# Patient Record
Sex: Female | Born: 1935
Health system: Southern US, Community
[De-identification: ages and names within clinical notes are randomized; demographics above are authoritative.]

## PROBLEM LIST (undated history)

## (undated) DIAGNOSIS — N3941 Urge incontinence: Secondary | ICD-10-CM

## (undated) DIAGNOSIS — I10 Essential (primary) hypertension: Secondary | ICD-10-CM

## (undated) DIAGNOSIS — I428 Other cardiomyopathies: Secondary | ICD-10-CM

## (undated) DIAGNOSIS — Z95810 Presence of automatic (implantable) cardiac defibrillator: Secondary | ICD-10-CM

## (undated) DIAGNOSIS — I480 Paroxysmal atrial fibrillation: Secondary | ICD-10-CM

## (undated) DIAGNOSIS — E785 Hyperlipidemia, unspecified: Secondary | ICD-10-CM

## (undated) DIAGNOSIS — I7300 Raynaud's syndrome without gangrene: Secondary | ICD-10-CM

## (undated) DIAGNOSIS — R000 Tachycardia, unspecified: Secondary | ICD-10-CM

## (undated) DIAGNOSIS — G609 Hereditary and idiopathic neuropathy, unspecified: Secondary | ICD-10-CM

## (undated) DIAGNOSIS — I5020 Unspecified systolic (congestive) heart failure: Secondary | ICD-10-CM

## (undated) DIAGNOSIS — T82198A Other mechanical complication of other cardiac electronic device, initial encounter: Secondary | ICD-10-CM

## (undated) HISTORY — DX: Tachycardia, unspecified: R00.0

## (undated) HISTORY — PX: APPENDECTOMY: SHX54

## (undated) HISTORY — DX: Other mechanical complication of other cardiac electronic device, initial encounter: T82.198A

## (undated) HISTORY — PX: OTHER SURGICAL HISTORY: SHX169

## (undated) HISTORY — DX: Presence of automatic (implantable) cardiac defibrillator: Z95.810

## (undated) HISTORY — PX: ABDOMINAL HYSTERECTOMY: SHX81

## (undated) HISTORY — DX: Hyperlipidemia, unspecified: E78.5

## (undated) HISTORY — DX: Paroxysmal atrial fibrillation: I48.0

## (undated) HISTORY — DX: Other cardiomyopathies: I42.8

## (undated) HISTORY — DX: Raynaud's syndrome without gangrene: I73.00

## (undated) HISTORY — DX: Essential (primary) hypertension: I10

## (undated) HISTORY — DX: Hereditary and idiopathic neuropathy, unspecified: G60.9

## (undated) HISTORY — DX: Unspecified systolic (congestive) heart failure: I50.20

## (undated) HISTORY — DX: Urge incontinence: N39.41

---

## 1990-03-31 ENCOUNTER — Encounter (INDEPENDENT_AMBULATORY_CARE_PROVIDER_SITE_OTHER): Payer: Self-pay | Admitting: *Deleted

## 1990-03-31 HISTORY — PX: OTHER SURGICAL HISTORY: SHX169

## 1997-09-29 ENCOUNTER — Encounter: Admission: RE | Admit: 1997-09-29 | Discharge: 1997-09-29 | Payer: Self-pay | Admitting: Family Medicine

## 1997-10-03 ENCOUNTER — Encounter: Admission: RE | Admit: 1997-10-03 | Discharge: 1997-10-03 | Payer: Self-pay | Admitting: Family Medicine

## 1998-02-01 ENCOUNTER — Encounter: Admission: RE | Admit: 1998-02-01 | Discharge: 1998-02-01 | Payer: Self-pay | Admitting: Family Medicine

## 1998-10-18 ENCOUNTER — Encounter: Admission: RE | Admit: 1998-10-18 | Discharge: 1998-10-18 | Payer: Self-pay | Admitting: Family Medicine

## 1998-11-12 ENCOUNTER — Other Ambulatory Visit: Admission: RE | Admit: 1998-11-12 | Discharge: 1998-11-12 | Payer: Self-pay | Admitting: *Deleted

## 1998-11-12 ENCOUNTER — Encounter: Admission: RE | Admit: 1998-11-12 | Discharge: 1998-11-12 | Payer: Self-pay | Admitting: Family Medicine

## 1999-01-31 ENCOUNTER — Encounter: Admission: RE | Admit: 1999-01-31 | Discharge: 1999-01-31 | Payer: Self-pay | Admitting: Family Medicine

## 2000-01-27 ENCOUNTER — Encounter: Admission: RE | Admit: 2000-01-27 | Discharge: 2000-01-27 | Payer: Self-pay | Admitting: Sports Medicine

## 2001-01-26 ENCOUNTER — Emergency Department (HOSPITAL_COMMUNITY): Admission: EM | Admit: 2001-01-26 | Discharge: 2001-01-26 | Payer: Self-pay | Admitting: Emergency Medicine

## 2001-01-27 ENCOUNTER — Encounter: Admission: RE | Admit: 2001-01-27 | Discharge: 2001-01-27 | Payer: Self-pay | Admitting: Family Medicine

## 2001-02-03 ENCOUNTER — Encounter: Admission: RE | Admit: 2001-02-03 | Discharge: 2001-02-03 | Payer: Self-pay | Admitting: Family Medicine

## 2002-01-11 ENCOUNTER — Encounter: Admission: RE | Admit: 2002-01-11 | Discharge: 2002-01-11 | Payer: Self-pay | Admitting: Family Medicine

## 2002-01-24 ENCOUNTER — Encounter: Admission: RE | Admit: 2002-01-24 | Discharge: 2002-01-24 | Payer: Self-pay | Admitting: Family Medicine

## 2002-02-21 ENCOUNTER — Encounter: Admission: RE | Admit: 2002-02-21 | Discharge: 2002-02-21 | Payer: Self-pay | Admitting: Sports Medicine

## 2002-03-01 ENCOUNTER — Encounter: Admission: RE | Admit: 2002-03-01 | Discharge: 2002-03-01 | Payer: Self-pay | Admitting: Family Medicine

## 2002-03-01 ENCOUNTER — Encounter: Admission: RE | Admit: 2002-03-01 | Discharge: 2002-03-01 | Payer: Self-pay | Admitting: Sports Medicine

## 2002-03-01 ENCOUNTER — Encounter: Payer: Self-pay | Admitting: Sports Medicine

## 2002-05-05 ENCOUNTER — Encounter: Admission: RE | Admit: 2002-05-05 | Discharge: 2002-05-05 | Payer: Self-pay | Admitting: Family Medicine

## 2002-08-31 ENCOUNTER — Encounter: Admission: RE | Admit: 2002-08-31 | Discharge: 2002-08-31 | Payer: Self-pay | Admitting: Family Medicine

## 2002-12-23 ENCOUNTER — Encounter: Admission: RE | Admit: 2002-12-23 | Discharge: 2002-12-23 | Payer: Self-pay | Admitting: Family Medicine

## 2003-01-26 ENCOUNTER — Encounter: Admission: RE | Admit: 2003-01-26 | Discharge: 2003-01-26 | Payer: Self-pay | Admitting: Family Medicine

## 2003-05-17 ENCOUNTER — Encounter: Admission: RE | Admit: 2003-05-17 | Discharge: 2003-05-17 | Payer: Self-pay | Admitting: Family Medicine

## 2003-11-17 ENCOUNTER — Encounter: Admission: RE | Admit: 2003-11-17 | Discharge: 2003-11-17 | Payer: Self-pay | Admitting: Family Medicine

## 2003-11-17 ENCOUNTER — Ambulatory Visit (HOSPITAL_COMMUNITY): Admission: RE | Admit: 2003-11-17 | Discharge: 2003-11-17 | Payer: Self-pay | Admitting: Family Medicine

## 2003-11-20 ENCOUNTER — Encounter: Admission: RE | Admit: 2003-11-20 | Discharge: 2003-11-20 | Payer: Self-pay | Admitting: Sports Medicine

## 2003-12-11 ENCOUNTER — Ambulatory Visit: Payer: Self-pay | Admitting: Family Medicine

## 2003-12-21 ENCOUNTER — Ambulatory Visit: Payer: Self-pay | Admitting: Family Medicine

## 2004-01-15 ENCOUNTER — Ambulatory Visit: Payer: Self-pay | Admitting: Sports Medicine

## 2004-03-05 ENCOUNTER — Ambulatory Visit: Payer: Self-pay | Admitting: Sports Medicine

## 2004-12-24 ENCOUNTER — Ambulatory Visit: Payer: Self-pay | Admitting: Sports Medicine

## 2005-01-02 ENCOUNTER — Encounter: Admission: RE | Admit: 2005-01-02 | Discharge: 2005-04-02 | Payer: Self-pay | Admitting: Sports Medicine

## 2005-02-27 ENCOUNTER — Ambulatory Visit (HOSPITAL_COMMUNITY): Admission: RE | Admit: 2005-02-27 | Discharge: 2005-02-27 | Payer: Self-pay | Admitting: Family Medicine

## 2005-02-27 ENCOUNTER — Ambulatory Visit: Payer: Self-pay | Admitting: Family Medicine

## 2005-02-28 ENCOUNTER — Ambulatory Visit (HOSPITAL_COMMUNITY): Admission: RE | Admit: 2005-02-28 | Discharge: 2005-02-28 | Payer: Self-pay | Admitting: Family Medicine

## 2005-02-28 ENCOUNTER — Ambulatory Visit: Payer: Self-pay | Admitting: Family Medicine

## 2005-03-27 ENCOUNTER — Encounter: Admission: RE | Admit: 2005-03-27 | Discharge: 2005-03-27 | Payer: Self-pay | Admitting: Sports Medicine

## 2005-03-31 HISTORY — PX: CARDIAC CATHETERIZATION: SHX172

## 2005-04-03 ENCOUNTER — Inpatient Hospital Stay (HOSPITAL_COMMUNITY): Admission: AD | Admit: 2005-04-03 | Discharge: 2005-04-12 | Payer: Self-pay | Admitting: Cardiovascular Disease

## 2005-04-08 ENCOUNTER — Ambulatory Visit: Payer: Self-pay | Admitting: Emergency Medicine

## 2005-04-28 ENCOUNTER — Ambulatory Visit: Payer: Self-pay | Admitting: Emergency Medicine

## 2005-05-28 ENCOUNTER — Ambulatory Visit: Payer: Self-pay | Admitting: Family Medicine

## 2005-05-30 ENCOUNTER — Ambulatory Visit: Payer: Self-pay | Admitting: Emergency Medicine

## 2005-06-16 ENCOUNTER — Ambulatory Visit: Payer: Self-pay | Admitting: Family Medicine

## 2005-09-08 ENCOUNTER — Encounter: Admission: RE | Admit: 2005-09-08 | Discharge: 2005-09-08 | Payer: Self-pay | Admitting: Cardiovascular Disease

## 2005-09-11 ENCOUNTER — Ambulatory Visit: Payer: Self-pay | Admitting: Family Medicine

## 2005-10-30 ENCOUNTER — Encounter (HOSPITAL_COMMUNITY): Admission: RE | Admit: 2005-10-30 | Discharge: 2006-01-28 | Payer: Self-pay | Admitting: Cardiology

## 2005-11-18 ENCOUNTER — Ambulatory Visit: Payer: Self-pay | Admitting: Sports Medicine

## 2005-12-16 ENCOUNTER — Ambulatory Visit: Payer: Self-pay | Admitting: Sports Medicine

## 2005-12-24 ENCOUNTER — Ambulatory Visit: Payer: Self-pay | Admitting: Family Medicine

## 2006-03-20 ENCOUNTER — Ambulatory Visit: Payer: Self-pay | Admitting: Family Medicine

## 2006-05-28 DIAGNOSIS — I10 Essential (primary) hypertension: Secondary | ICD-10-CM

## 2006-05-28 DIAGNOSIS — M810 Age-related osteoporosis without current pathological fracture: Secondary | ICD-10-CM

## 2006-05-28 DIAGNOSIS — I428 Other cardiomyopathies: Secondary | ICD-10-CM | POA: Insufficient documentation

## 2006-05-28 DIAGNOSIS — N3941 Urge incontinence: Secondary | ICD-10-CM

## 2006-05-28 DIAGNOSIS — I5022 Chronic systolic (congestive) heart failure: Secondary | ICD-10-CM

## 2006-05-28 DIAGNOSIS — E785 Hyperlipidemia, unspecified: Secondary | ICD-10-CM | POA: Insufficient documentation

## 2006-05-29 ENCOUNTER — Encounter (INDEPENDENT_AMBULATORY_CARE_PROVIDER_SITE_OTHER): Payer: Self-pay | Admitting: *Deleted

## 2006-07-29 ENCOUNTER — Encounter (INDEPENDENT_AMBULATORY_CARE_PROVIDER_SITE_OTHER): Payer: Self-pay | Admitting: Family Medicine

## 2006-07-29 ENCOUNTER — Ambulatory Visit: Payer: Self-pay | Admitting: Family Medicine

## 2006-07-29 LAB — CONVERTED CEMR LAB
ALT: 12 units/L (ref 0–35)
CO2: 30 meq/L (ref 19–32)
Calcium: 9 mg/dL (ref 8.4–10.5)
Chloride: 101 meq/L (ref 96–112)
Creatinine, Ser: 0.67 mg/dL (ref 0.40–1.20)
Glucose, Bld: 116 mg/dL — ABNORMAL HIGH (ref 70–99)
LDL Goal: 100 mg/dL
TSH: 3.217 microintl units/mL (ref 0.350–5.50)
Total Protein: 6.2 g/dL (ref 6.0–8.3)

## 2006-07-30 ENCOUNTER — Encounter (INDEPENDENT_AMBULATORY_CARE_PROVIDER_SITE_OTHER): Payer: Self-pay | Admitting: Family Medicine

## 2006-08-01 ENCOUNTER — Encounter (INDEPENDENT_AMBULATORY_CARE_PROVIDER_SITE_OTHER): Payer: Self-pay | Admitting: Family Medicine

## 2006-08-27 ENCOUNTER — Encounter (INDEPENDENT_AMBULATORY_CARE_PROVIDER_SITE_OTHER): Payer: Self-pay | Admitting: Family Medicine

## 2006-08-27 ENCOUNTER — Encounter: Admission: RE | Admit: 2006-08-27 | Discharge: 2006-08-27 | Payer: Self-pay | Admitting: Family Medicine

## 2006-08-30 ENCOUNTER — Encounter (INDEPENDENT_AMBULATORY_CARE_PROVIDER_SITE_OTHER): Payer: Self-pay | Admitting: Family Medicine

## 2006-10-19 ENCOUNTER — Encounter (INDEPENDENT_AMBULATORY_CARE_PROVIDER_SITE_OTHER): Payer: Self-pay | Admitting: Family Medicine

## 2006-10-26 ENCOUNTER — Telehealth (INDEPENDENT_AMBULATORY_CARE_PROVIDER_SITE_OTHER): Payer: Self-pay | Admitting: Family Medicine

## 2006-11-18 ENCOUNTER — Telehealth (INDEPENDENT_AMBULATORY_CARE_PROVIDER_SITE_OTHER): Payer: Self-pay | Admitting: Family Medicine

## 2006-11-20 ENCOUNTER — Encounter (INDEPENDENT_AMBULATORY_CARE_PROVIDER_SITE_OTHER): Payer: Self-pay | Admitting: Family Medicine

## 2006-11-20 ENCOUNTER — Ambulatory Visit: Payer: Self-pay | Admitting: Family Medicine

## 2006-11-20 LAB — CONVERTED CEMR LAB: Vit D, 1,25-Dihydroxy: 46

## 2006-11-24 ENCOUNTER — Encounter (INDEPENDENT_AMBULATORY_CARE_PROVIDER_SITE_OTHER): Payer: Self-pay | Admitting: Family Medicine

## 2006-12-04 ENCOUNTER — Ambulatory Visit: Payer: Self-pay | Admitting: Family Medicine

## 2006-12-09 ENCOUNTER — Encounter (INDEPENDENT_AMBULATORY_CARE_PROVIDER_SITE_OTHER): Payer: Self-pay | Admitting: Family Medicine

## 2006-12-16 ENCOUNTER — Encounter (INDEPENDENT_AMBULATORY_CARE_PROVIDER_SITE_OTHER): Payer: Self-pay | Admitting: Family Medicine

## 2007-01-12 ENCOUNTER — Encounter (INDEPENDENT_AMBULATORY_CARE_PROVIDER_SITE_OTHER): Payer: Self-pay | Admitting: Family Medicine

## 2007-01-13 ENCOUNTER — Ambulatory Visit: Payer: Self-pay | Admitting: Family Medicine

## 2007-03-17 ENCOUNTER — Ambulatory Visit: Payer: Self-pay | Admitting: Family Medicine

## 2007-04-09 ENCOUNTER — Ambulatory Visit: Payer: Self-pay | Admitting: Family Medicine

## 2007-07-23 ENCOUNTER — Ambulatory Visit: Payer: Self-pay | Admitting: Family Medicine

## 2007-10-19 ENCOUNTER — Ambulatory Visit: Payer: Self-pay | Admitting: Family Medicine

## 2007-10-20 ENCOUNTER — Encounter: Payer: Self-pay | Admitting: *Deleted

## 2007-12-29 ENCOUNTER — Encounter: Payer: Self-pay | Admitting: Family Medicine

## 2008-01-25 ENCOUNTER — Telehealth (INDEPENDENT_AMBULATORY_CARE_PROVIDER_SITE_OTHER): Payer: Self-pay | Admitting: *Deleted

## 2008-01-26 ENCOUNTER — Encounter: Payer: Self-pay | Admitting: Family Medicine

## 2008-01-26 ENCOUNTER — Ambulatory Visit: Payer: Self-pay | Admitting: Family Medicine

## 2008-01-26 ENCOUNTER — Ambulatory Visit (HOSPITAL_COMMUNITY): Admission: RE | Admit: 2008-01-26 | Discharge: 2008-01-26 | Payer: Self-pay | Admitting: Family Medicine

## 2008-01-26 ENCOUNTER — Telehealth: Payer: Self-pay | Admitting: Family Medicine

## 2008-01-26 ENCOUNTER — Telehealth (INDEPENDENT_AMBULATORY_CARE_PROVIDER_SITE_OTHER): Payer: Self-pay | Admitting: Family Medicine

## 2008-01-26 LAB — CONVERTED CEMR LAB
Nitrite: NEGATIVE
Specific Gravity, Urine: 1.015
Urobilinogen, UA: 0.2

## 2008-01-28 ENCOUNTER — Encounter: Payer: Self-pay | Admitting: Family Medicine

## 2008-02-15 ENCOUNTER — Ambulatory Visit: Payer: Self-pay | Admitting: Family Medicine

## 2008-03-15 ENCOUNTER — Encounter: Payer: Self-pay | Admitting: Family Medicine

## 2008-03-21 ENCOUNTER — Encounter: Payer: Self-pay | Admitting: Family Medicine

## 2008-04-12 ENCOUNTER — Ambulatory Visit: Payer: Self-pay | Admitting: Family Medicine

## 2008-04-25 ENCOUNTER — Ambulatory Visit: Payer: Self-pay | Admitting: Family Medicine

## 2008-04-28 ENCOUNTER — Encounter: Payer: Self-pay | Admitting: Family Medicine

## 2008-05-15 ENCOUNTER — Encounter: Payer: Self-pay | Admitting: Family Medicine

## 2008-05-19 ENCOUNTER — Ambulatory Visit: Payer: Self-pay | Admitting: Family Medicine

## 2008-05-19 ENCOUNTER — Encounter: Payer: Self-pay | Admitting: Family Medicine

## 2008-06-09 ENCOUNTER — Telehealth: Payer: Self-pay | Admitting: *Deleted

## 2008-06-13 ENCOUNTER — Encounter: Payer: Self-pay | Admitting: Family Medicine

## 2008-07-24 ENCOUNTER — Ambulatory Visit: Payer: Self-pay | Admitting: Family Medicine

## 2008-09-28 ENCOUNTER — Inpatient Hospital Stay (HOSPITAL_COMMUNITY): Admission: EM | Admit: 2008-09-28 | Discharge: 2008-09-29 | Payer: Self-pay | Admitting: Emergency Medicine

## 2008-09-28 LAB — CONVERTED CEMR LAB
ALT: 13 U/L
AST: 23 U/L
Albumin: 3.5 g/dL
Alkaline Phosphatase: 37 U/L
BUN: 9 mg/dL
CO2: 28 meq/L
Calcium: 9.3 mg/dL
Chloride: 102 meq/L
Creatinine, Ser: 0.8 mg/dL
Potassium: 4.5 meq/L
Sodium: 139 meq/L
TSH: 2.658 u[IU]/mL
Total Bilirubin: 0.7 mg/dL
Total Protein: 5.8 g/dL

## 2008-10-16 ENCOUNTER — Ambulatory Visit: Payer: Self-pay | Admitting: Family Medicine

## 2008-10-23 ENCOUNTER — Ambulatory Visit: Payer: Self-pay | Admitting: Family Medicine

## 2008-10-25 ENCOUNTER — Encounter: Payer: Self-pay | Admitting: Family Medicine

## 2009-01-17 ENCOUNTER — Encounter: Payer: Self-pay | Admitting: *Deleted

## 2009-01-26 ENCOUNTER — Ambulatory Visit: Payer: Self-pay | Admitting: Family Medicine

## 2009-03-09 ENCOUNTER — Encounter: Payer: Self-pay | Admitting: Family Medicine

## 2009-03-21 ENCOUNTER — Encounter: Payer: Self-pay | Admitting: Family Medicine

## 2009-04-30 ENCOUNTER — Ambulatory Visit: Payer: Self-pay | Admitting: Family Medicine

## 2009-04-30 ENCOUNTER — Encounter: Payer: Self-pay | Admitting: Family Medicine

## 2009-04-30 LAB — CONVERTED CEMR LAB
CO2: 26 meq/L (ref 19–32)
Calcium: 8.7 mg/dL (ref 8.4–10.5)
Chloride: 101 meq/L (ref 96–112)
Potassium: 4.5 meq/L (ref 3.5–5.3)
Sodium: 137 meq/L (ref 135–145)

## 2009-05-01 ENCOUNTER — Encounter: Payer: Self-pay | Admitting: Family Medicine

## 2009-05-05 ENCOUNTER — Emergency Department (HOSPITAL_COMMUNITY): Admission: EM | Admit: 2009-05-05 | Discharge: 2009-05-05 | Payer: Self-pay | Admitting: Emergency Medicine

## 2009-08-03 ENCOUNTER — Ambulatory Visit: Payer: Self-pay | Admitting: Family Medicine

## 2009-09-19 ENCOUNTER — Encounter: Payer: Self-pay | Admitting: Family Medicine

## 2009-10-29 ENCOUNTER — Ambulatory Visit: Payer: Self-pay | Admitting: Family Medicine

## 2009-10-29 ENCOUNTER — Encounter: Payer: Self-pay | Admitting: Family Medicine

## 2009-10-29 LAB — CONVERTED CEMR LAB
CO2: 28 meq/L (ref 19–32)
Chloride: 103 meq/L (ref 96–112)
Creatinine, Ser: 0.68 mg/dL (ref 0.40–1.20)

## 2009-12-18 ENCOUNTER — Encounter: Payer: Self-pay | Admitting: Family Medicine

## 2010-01-03 ENCOUNTER — Ambulatory Visit: Payer: Self-pay | Admitting: Family Medicine

## 2010-02-04 ENCOUNTER — Ambulatory Visit: Payer: Self-pay | Admitting: Family Medicine

## 2010-02-04 ENCOUNTER — Telehealth: Payer: Self-pay | Admitting: Family Medicine

## 2010-03-07 ENCOUNTER — Ambulatory Visit: Payer: Self-pay | Admitting: Family Medicine

## 2010-03-07 ENCOUNTER — Encounter: Payer: Self-pay | Admitting: Family Medicine

## 2010-03-07 LAB — CONVERTED CEMR LAB
AST: 38 units/L — ABNORMAL HIGH (ref 0–37)
BUN: 13 mg/dL (ref 6–23)
Calcium: 9.3 mg/dL (ref 8.4–10.5)
Chloride: 106 meq/L (ref 96–112)
Creatinine, Ser: 0.71 mg/dL (ref 0.40–1.20)
HCT: 39 % (ref 36.0–46.0)
HDL: 92 mg/dL (ref >39)
Hemoglobin: 12.3 g/dL (ref 12.0–15.0)
LDL Cholesterol: 116 mg/dL — ABNORMAL HIGH (ref 0–99)
RDW: 17.7 % — ABNORMAL HIGH (ref 11.5–15.5)
Total CHOL/HDL Ratio: 2.5
WBC: 6.4 10*3/uL (ref 4.0–10.5)

## 2010-03-11 ENCOUNTER — Encounter: Payer: Self-pay | Admitting: Family Medicine

## 2010-03-11 ENCOUNTER — Ambulatory Visit: Payer: Self-pay | Admitting: Family Medicine

## 2010-03-11 DIAGNOSIS — R5383 Other fatigue: Secondary | ICD-10-CM

## 2010-03-12 LAB — CONVERTED CEMR LAB: Free T4: 1.14 ng/dL (ref 0.80–1.80)

## 2010-03-19 ENCOUNTER — Encounter: Payer: Self-pay | Admitting: Family Medicine

## 2010-03-19 HISTORY — PX: NM MYOCAR PERF WALL MOTION: HXRAD629

## 2010-03-31 DIAGNOSIS — G609 Hereditary and idiopathic neuropathy, unspecified: Secondary | ICD-10-CM

## 2010-03-31 HISTORY — DX: Hereditary and idiopathic neuropathy, unspecified: G60.9

## 2010-04-28 LAB — CONVERTED CEMR LAB
ALT: 17 units/L
AST: 27 units/L
Cholesterol: 234 mg/dL
Creatinine, Ser: 0.8 mg/dL
Potassium: 4.6 meq/L

## 2010-05-02 NOTE — Assessment & Plan Note (Signed)
Summary: [facility]   Nurse Visit   Vital Signs:  Patient profile:   77 year old female Temp:     38 degrees F  Vitals Entered By: Theresia Lo RN (January 03, 2010 8:44 AM)  Allergies: 1)  Sulfamethoxazole (Sulfamethoxazole)  Immunizations Administered:  Influenza Vaccine # 1:    Vaccine Type: Fluvax MCR    Site: left deltoid    Mfr: GlaxoSmithKline    Dose: 0.5 ml    Route: IM    Given by: Theresia Lo RN    Exp. Date: 09/25/2010    Lot #: [id number]    VIS given: 10/23/09 version given January 03, 2010.  Flu Vaccine Consent Questions:    Do you have a history of severe allergic reactions to this vaccine? no    Any prior history of allergic reactions to egg and/or gelatin? no    Do you have a sensitivity to the preservative Thimersol? no    Do you have a past history of Guillan-Barre Syndrome? no    Do you currently have an acute febrile illness? no    Have you ever had a severe reaction to latex? no    Vaccine information given and explained to patient? yes    Are you currently pregnant? no  Orders Added: 1)  Influenza Vaccine MCR [00025] 2)  Administration Flu vaccine - MCR [G0008]     Vital Signs:  Patient profile:   77 year old female Temp:     49 degrees F  Vitals Entered By: Theresia Lo RN (January 03, 2010 8:44 AM)

## 2010-05-02 NOTE — Assessment & Plan Note (Signed)
Summary: boniva,df   Vital Signs:  Patient profile:   77 year old female Weight:      117.4 pounds Pulse rate:   82 / minute BP sitting:   121 / 65  (right arm)  Vitals Entered By: Arlyss Repress CMA, (February 04, 2010 9:58 AM) CC: boniva IV   Primary Care Provider:  CAT TA MD  CC:  boniva IV.  History of Present Illness: Here for IVP Boniva, denies any bone pain.  Taking her calcium and vitamin D.  Current Medications (verified): 1)  Aspirin Ec 81 Mg Tbec (Aspirin) .... Take 1 Tablet By Mouth Bid 2)  Coreg 6.25 Mg Tabs (Carvedilol) .Marland Kitchen.. 1 Tablet By Mouth Bid 3)  Micardis 40 Mg Tabs (Telmisartan) .... One Tablet By Mouth Daily 4)  Lanoxin 0.125 Mg Tabs (Digoxin) .... Take 1 Tablet By Mouth Once A Day 5)  Lasix 40 Mg Tabs (Furosemide) .... Take 1 Tablet By Mouth Once A Day As Needed Breathlessness or Swelling 6)  Red Yeast Rice 600 Mg Caps (Red Yeast Rice Extract) .Marland Kitchen.. 1 Tablet By Mouth Bid 7)  Calcium 600 600 Mg Tabs (Calcium Carbonate) .Marland Kitchen.. 1 Tablet By Mouth Bid 8)  Boniva 3 Mg/52ml Kit (Ibandronate Sodium) 9)  Spironolactone 25 Mg Tabs (Spironolactone) .... 1/2 Tablet By Mouth Daily  Allergies (verified): 1)  Sulfamethoxazole (Sulfamethoxazole)  Physical Exam  General:  Well-developed,well-nourished,in no acute distress; alert,appropriate and cooperative throughout examination   Impression & Recommendations:  Problem # 1:  OSTEOPOROSIS, UNSPECIFIED (ICD-733.00)  IVP Boniva given, excellent blood return, patient tolerated procedule well. Lot [id number], exp 5/13  Her updated medication list for this problem includes:    Calcium 600 600 Mg Tabs (Calcium carbonate) .Marland Kitchen... 1 tablet by mouth bid    Boniva 3 Mg/50ml Kit (Ibandronate sodium)  Orders: Provider Misc Charge- [facility] (Misc)  Complete Medication List: 1)  Aspirin Ec 81 Mg Tbec (Aspirin) .... Take 1 tablet by mouth bid 2)  Coreg 6.25 Mg Tabs (Carvedilol) .Marland Kitchen.. 1 tablet by mouth bid 3)  Micardis 40 Mg Tabs  (Telmisartan) .... One tablet by mouth daily 4)  Lanoxin 0.125 Mg Tabs (Digoxin) .... Take 1 tablet by mouth once a day 5)  Lasix 40 Mg Tabs (Furosemide) .... Take 1 tablet by mouth once a day as needed breathlessness or swelling 6)  Red Yeast Rice 600 Mg Caps (Red yeast rice extract) .Marland Kitchen.. 1 tablet by mouth bid 7)  Calcium 600 600 Mg Tabs (Calcium carbonate) .Marland Kitchen.. 1 tablet by mouth bid 8)  Boniva 3 Mg/28ml Kit (Ibandronate sodium) 9)  Spironolactone 25 Mg Tabs (Spironolactone) .... 1/2 tablet by mouth daily   Orders Added: 1)  Provider Misc Charge- [facility] [Misc]

## 2010-05-02 NOTE — Consult Note (Signed)
[facility]   Imported By: De Nurse 03/21/2010 16:53:11  _____________________________________________________________________  External Attachment:    Type:   Image     Comment:   External Document

## 2010-05-02 NOTE — Assessment & Plan Note (Signed)
Summary: boniva/eo   Vital Signs:  Patient profile:   77 year old female Weight:      113.4 pounds Pulse rate:   83 / minute BP sitting:   126 / 66  (right arm)  Vitals Entered By: Arlyss Repress CMA, (October 29, 2009 8:39 AM) CC: Boniva IV Is Patient Diabetic? No Pain Assessment Patient in pain? no        Primary Care Provider:  CAT TA MD  CC:  Boniva IV.  History of Present Illness: Here for IV Boniva.  Deneis any joint or bone pain.    Habits & Providers  Alcohol-Tobacco-Diet     Tobacco Status: quit  Current Medications (verified): 1)  Aspirin Ec 81 Mg Tbec (Aspirin) .... Take 1 Tablet By Mouth Bid 2)  Coreg 6.25 Mg Tabs (Carvedilol) .Marland Kitchen.. 1 Tablet By Mouth Bid 3)  Micardis 40 Mg Tabs (Telmisartan) .... One Tablet By Mouth Daily 4)  Lanoxin 0.125 Mg Tabs (Digoxin) .... Take 1 Tablet By Mouth Once A Day 5)  Lasix 40 Mg Tabs (Furosemide) .... Take 1 Tablet By Mouth Once A Day As Needed Breathlessness or Swelling 6)  Red Yeast Rice 600 Mg Caps (Red Yeast Rice Extract) .Marland Kitchen.. 1 Tablet By Mouth Bid 7)  Calcium 600 600 Mg Tabs (Calcium Carbonate) .Marland Kitchen.. 1 Tablet By Mouth Bid 8)  Boniva 3 Mg/55ml Kit (Ibandronate Sodium) 9)  Spironolactone 25 Mg Tabs (Spironolactone) .... 1/2 Tablet By Mouth Daily  Allergies (verified): 1)  Sulfamethoxazole (Sulfamethoxazole)  Physical Exam  General:  Well-developed,well-nourished,in no acute distress; alert,appropriate and cooperative throughout examination   Impression & Recommendations:  Problem # 1:  OSTEOPOROSIS, UNSPECIFIED (ICD-733.00) IVP Boniva given without complications. Lot [id number], exp 9/12  BMET drawn to check renal function. Her updated medication list for this problem includes:    Calcium 600 600 Mg Tabs (Calcium carbonate) .Marland Kitchen... 1 tablet by mouth bid    Boniva 3 Mg/52ml Kit (Ibandronate sodium)  Orders: Basic Met-[facility] (09811-91478) Provider Misc Charge- [facility] (Misc)  Complete Medication List: 1)  Aspirin Ec 81 Mg  Tbec (Aspirin) .... Take 1 tablet by mouth bid 2)  Coreg 6.25 Mg Tabs (Carvedilol) .Marland Kitchen.. 1 tablet by mouth bid 3)  Micardis 40 Mg Tabs (Telmisartan) .... One tablet by mouth daily 4)  Lanoxin 0.125 Mg Tabs (Digoxin) .... Take 1 tablet by mouth once a day 5)  Lasix 40 Mg Tabs (Furosemide) .... Take 1 tablet by mouth once a day as needed breathlessness or swelling 6)  Red Yeast Rice 600 Mg Caps (Red yeast rice extract) .Marland Kitchen.. 1 tablet by mouth bid 7)  Calcium 600 600 Mg Tabs (Calcium carbonate) .Marland Kitchen.. 1 tablet by mouth bid 8)  Boniva 3 Mg/61ml Kit (Ibandronate sodium) 9)  Spironolactone 25 Mg Tabs (Spironolactone) .... 1/2 tablet by mouth daily  Patient Instructions: 1)  Please schedule a follow-up appointment in 3 months .

## 2010-05-02 NOTE — Consult Note (Signed)
Summary: [facility]  [facility]   Imported By: Clydell Hakim 09/26/2009 16:00:24  _____________________________________________________________________  External Attachment:    Type:   Image     Comment:   External Document  Appended Document: [facility]     Clinical Lists Changes  Observations: Added new observation of PAST MED HX: EKG w/ LBBB Nonischemic cardiomyopathy with St Jude  Atlas Biventricular device placed in 2007 at [facility] (followed by Dr Julieanne Mans[facility]) Short bursts of PAF, asymptomatic.  Dr Clarene Duke discussed potential coumadin with Pt 08/2009.   No CAD per cath CHF with EF 40% following AICD EF 40-45%% by cath and ECHO 9/09 HTN Cardiologist:  Dr Clarene Duke of [facility] Statin intolerant Hyperlipidemia, takes red yeast capsules Raynaud phenomenon   (10/02/2009 15:25) Added new observation of PRIMARY MD: Konnar Ben MD (10/02/2009 15:25)        Past History:  Past Medical History: EKG w/ LBBB Nonischemic cardiomyopathy with St Jude  Atlas Biventricular device placed in 2007 at [facility] (followed by Dr Julieanne Mans[facility] of [facility]) Short bursts of PAF, asymptomatic.  Dr Clarene Duke discussed potential coumadin with Pt 08/2009.   No CAD per cath CHF with EF 40% following AICD EF 40-45%% by cath and ECHO 9/09 HTN Cardiologist:  Dr Clarene Duke of [facility] Statin intolerant Hyperlipidemia, takes red yeast capsules Raynaud phenomenon

## 2010-05-02 NOTE — Miscellaneous (Signed)
Summary: Changing CHF to Systolic CHF   Clinical Lists Changes  Problems: Changed problem from CHF - EJECTION FRACTION < 50% (ICD-428.22) to CHRONIC SYSTOLIC HEART FAILURE (ICD-428.22) Observations: Added new observation of PRIMARY MD: Eiliyah Reh MD (12/18/2009 10:48)

## 2010-05-02 NOTE — Progress Notes (Signed)
Summary: Req labs   Phone Note Call from Patient Call back at Home Phone [phone]   Reason for Call: Talk to Nurse Summary of Call: pt is asking MD to put in lab orders for cholesterol & thyroid & then wants to have an appt with MD to review the results, pt doesn't want to come 2 times for just labs. If MD agrees please forward to scheduler so pt can come before her appt on 12/12.  Initial call taken by: Knox Royalty,  February 04, 2010 10:36 AM  Follow-up for Phone Call        I'll order routine labs.  Thyroid is not indicated for her because she is not taking thyroid med.  I don't have an indication for it.  I will get FLP, as pt is on red yeast rice.   Follow-up by: Cat Ta MD,  February 05, 2010 9:39 AM  Additional Follow-up for Phone Call Additional follow up Details #1::        left pt a vm to return call so I could schedule lab appt.  Additional Follow-up by: Knox Royalty,  February 05, 2010 12:45 PM

## 2010-05-02 NOTE — Assessment & Plan Note (Signed)
Summary: lab results/eo   Vital Signs:  Patient profile:   77 year old female Height:      63.5 inches Weight:      116 pounds BMI:     20.30 Temp:     97.8 degrees F BP sitting:   128 / 64  (left arm) Cuff size:   regular  Vitals Entered By: Tessie Fass CMA (March 11, 2010 8:39 AM) CC: F/U labs   Primary Care Provider:  CAT TA MD  CC:  F/U labs.  History of Present Illness: 77 y/o F with ischemic cardimyoapthy with Echo 2009 EF 40-45%, s/p St Jude biventricular device  CHF with EF 40-45%: Has Lasix 40mg  by mouth daily as needed for dyspnea or LE edema. Took it one time in Nov.  Stable.  Has not had any exercabtion requiring hospitaliztion in recent years.  No dyspnea, syncope, LE edema.    HLD: Statin intolerant.  Takes Red Rice Yeast.  LDL is trending upward.  Pt states, "At 89 with a heart that does not work that well, I don't think I need to take more meds."  Pt understand the risks and does not want to pursue another line of treatment for HLD.    HYPERTENSION Disease Monitoring   Blood pressure range:  120s/70s-80s Medications:  Coreg 6.25 two times a day, Laisx 40mg  as needed edem Compliance: yes  Lightheadedness: no   Edema: no Chest pain: no Dyspnea:no   Prevention Exercise: none  Salt restriction:yes    Fatigue: Feeling like she lacks energy x at least 2 yrs.  She can get all her daily acitivities.  She does not fall asleep during the day or nap.  Denies dyspnea on exertion.   Sleep: ave 4-5 yrs per night, stat Mood: "I stay in pretty good spirit" Appetite: "so-so, I eat because I know I need to, unless it's chocolate or cheese or sweets."  She eats more during the holidays.  Does not keep sweets in her house. Depression:  thinks that there is situational depression regarding her great grandchild.  Feelings of sadness because her granddaughter does not take care of her great grandchild.   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     Exercise: none.  She has a gym that she can go to and states that [facility] will pay for it next year.          Current Medications (verified): 1)  Aspirin Ec 81 Mg Tbec (Aspirin) .... Take 1 Tablet By Mouth Bid 2)  Coreg 6.25 Mg Tabs (Carvedilol) .Marland Kitchen.. 1 Tablet By Mouth Bid 3)  Lanoxin 0.125 Mg Tabs (Digoxin) .... Take 1 Tablet By Mouth Once A Day 4)  Lasix 40 Mg Tabs (Furosemide) .... Take 1 Tablet By Mouth Once A Day As Needed Breathlessness or Swelling 5)  Red Yeast Rice 600 Mg Caps (Red Yeast Rice Extract) .Marland Kitchen.. 1 Tablet By Mouth Bid 6)  Calcium 600 600 Mg Tabs (Calcium Carbonate) .Marland Kitchen.. 1 Tablet By Mouth Daily 7)  Boniva 3 Mg/39ml Kit (Ibandronate Sodium)  Allergies (verified): 1)  Sulfamethoxazole (Sulfamethoxazole)  Past History:  Past Surgical History: Last updated: 05/19/2008 Appendectomy - -, Biventricular ICD implant - 01/06/2006,  bladder tack -,  Bone Density - 07/99 (T= -2.05) -,  Cath 1/07 EF 25% nl Arteries-  nonischemic CM - 08/08/2005,  ECHO 7/07 EF 30% - 10/15/2005,  Hysterectomy - 1970`s -,  ICD implantation - 10/15/2005,   Lipoma removal 8/00 -,  suprapubic urethrovesicular suspension - 1992 -  Family History: Last updated: 03/11/2010 F- d. 90 bacterial infection M- Cardiomegaly d. 25 Son- DM, bipolar, MR  Social History: Last updated: 03/11/2010 Widowed 4 children- son Gerlene Burdock lives with her (mild MR).   Retired, formerly was Programme researcher, broadcasting/film/video.  Recovered alcoholic,  Quit 1979. Quit Smoking 2004.  Risk Factors: Smoking Status: quit (10/29/2009)  Past Medical History: EKG w/ LBBB Nonischemic cardiomyopathy with St Jude  Atlas Biventricular device placed in 2007 at [facility] (followed by Dr Julie[facility]) Short bursts of PAF, asymptomatic.  Dr Clarene Duke discussed potential coumadin with Pt 08/2009.   No CAD per cath CHF with EF 40% following AICD EF 40-45%% by cath and ECHO 9/09 HTN Cardiologist:  Dr Clarene Duke of [facility] Statin intolerant Hyperlipidemia, takes red yeast capsules Raynaud phenomenon  Family History: F- d. 90 bacterial infection M- Cardiomegaly d. 95 Son- DM, bipolar, MR  Social History: Widowed 4 children- son Gerlene Burdock lives with her (mild MR).   Retired, formerly was Programme researcher, broadcasting/film/video.  Recovered alcoholic,  Quit 1979. Quit Smoking 2004.  Review of Systems  The patient denies fever, weight loss, prolonged cough, hemoptysis, abdominal pain, melena, hematochezia, muscle weakness, unusual weight change, and abnormal bleeding.    Physical Exam  General:  Well-developed,well-nourished,in no acute distress; alert,appropriate and cooperative throughout examination. vitals reviewed.  Lungs:  Normal respiratory effort, chest expands symmetrically. Lungs are clear to auscultation, no crackles or wheezes. Heart:  Normal rate and regular rhythm. S1 and S2 normal without gallop, murmur, click, rub or other extra sounds. No bruit.  Abdomen:  Bowel sounds positive,abdomen soft and non-tender without masses, organomegaly or hernias noted. Pulses:  + 2 bilaterally  Extremities:  No clubbing, cyanosis, edema, or deformity noted with normal full range of motion of all joints.   Neurologic:  No cranial nerve deficits noted. Station and gait are normal. Plantar reflexes are down-going bilaterally. DTRs are symmetrical throughout. Sensory, motor and coordinative functions appear intact. Cervical Nodes:  No lymphadenopathy noted   Impression & Recommendations:  Problem # 1:  HYPERTENSION, BENIGN SYSTEMIC (ICD-401.1) Assessment Unchanged Pt is stable and at goal <130/80.  Pt is followed by Dr Clarene [facility] ([facility]) for cardiac needs.  She states that she is no longer taking Micardis and has been off this for months.  She on Coreg 6.25 two times a day and Lasix 40mg  as needed.  Will continue this plan for now.  Appt with Dr Clarene Duke tomorrow.    The following medications were  removed from the medication list:    Micardis 40 Mg Tabs (Telmisartan) ..... One tablet by mouth daily    Spironolactone 25 Mg Tabs (Spironolactone) .Marland Kitchen... 1/2 tablet by mouth daily Her updated medication list for this problem includes:    Coreg 6.25 Mg Tabs (Carvedilol) .Marland Kitchen... 1 tablet by mouth bid    Lasix 40 Mg Tabs (Furosemide) .Marland Kitchen... Take 1 tablet by mouth once a day as needed breathlessness or swelling  Orders: [facility]- Est  Level 4 ([address])  Problem # 2:  HYPERLIPIDEMIA (ICD-272.4) Assessment: Deteriorated FLP 02/2010 showed worsening LDL from 102 -->116, HDL 92, Trig 97.  Pt is taking Red Yeast Rice.   She cannot tolerate statin.  We discussed decreased protection mechanism of red yeast rice and increased risk of CAD.  Pt understands this.  She does not wish to make changes.    Orders: [facility]- Est  Level 4 ([address])  Problem # 3:  CARDIOMYOPATHY, IDIOPATHIC (ICD-425.4) Assessment: Unchanged She is followed by Dr Clarene Duke ([facility]).  Printed out labs for her to take to Dr Clarene Duke tomorrow.    Problem # 4:  INCONTINENCE, URGE (ICD-788.31) Assessment: Unchanged Pt has symptoms of overflow incontinence.  She has history of bladder tag.  Now where 2 sanitary pads.  She does not want further intervention.    Orders: [facility]- Est  Level 4 ([address]41)  Problem # 5:  FATIGUE (ICD-780.79) Assessment: New Pt complains of chronic fatigue x 2-3 yrs.  CBC with normal Hb and MCV.  Will check TSH and T4.  Screening negative for depression.    Orders: TSH-[facility] (32440-10272) Free T4-[facility] [phone]) [facility]- Est  Level 4 (42595)  Complete Medication List: 1)  Aspirin Ec 81 Mg Tbec (Aspirin) .... Take 1 tablet by mouth bid 2)  Coreg 6.25 Mg Tabs (Carvedilol) .Marland Kitchen.. 1 tablet by mouth bid 3)  Lanoxin 0.125 Mg Tabs (Digoxin) .... Take 1 tablet by mouth once a day 4)  Lasix 40 Mg Tabs (Furosemide) .... Take 1 tablet by mouth once a day as needed breathlessness or swelling 5)  Red Yeast Rice 600 Mg Caps (Red  yeast rice extract) .Marland Kitchen.. 1 tablet by mouth bid 6)  Calcium 600 600 Mg Tabs (Calcium carbonate) .Marland Kitchen.. 1 tablet by mouth daily 7)  Boniva 3 Mg/91ml Kit (Ibandronate sodium)  Patient Instructions: 1)  Please schedule a follow-up appointment in 6 months for routine follow-up.    Orders Added: 1)  TSH-[facility] [63875-64332] 2)  Free T4-[facility] [[id number]] 3)  [facility]- Est  Level 4 [63016]      Prevention & Chronic Care Immunizations   Influenza vaccine: Fluvax MCR  (01/03/2010)    Tetanus booster: 11/29/2004: Done.   Tetanus booster due: 11/30/2014    Pneumococcal vaccine: Done.  (05/29/1997)   Pneumococcal vaccine due: None    H. zoster vaccine: 05/19/2008: had shingles 1990  Colorectal Screening   Hemoccult: Done.  (01/03/2005)   Hemoccult due: Not Indicated    Colonoscopy: Not documented   Colonoscopy action/deferral: Refused  (03/11/2010)   Colonoscopy due: Not Indicated  Other Screening   Pap smear: Done.  (03/31/1990)   Pap smear due: Not Indicated    Mammogram: normal  (05/19/2008)   Mammogram action/deferral: Refused  (03/11/2010)   Mammogram due: Refused  (05/19/2008)    DXA bone density scan: abnormal T-score hip -2.4  (08/27/2006)   DXA bone density action/deferral: Refused  (03/11/2010)   DXA scan due: Not Indicated    Smoking status: quit  (10/29/2009)  Lipids   Total Cholesterol: 227  (03/07/2010)   LDL: 116  (03/07/2010)   LDL Direct: Not documented   HDL: 92  (03/07/2010)   Triglycerides: 97  (03/07/2010)    SGOT (AST): 38  (03/07/2010)   SGPT (ALT): 33  (03/07/2010)   Alkaline phosphatase: 48  (03/07/2010)   Total bilirubin: 0.8  (03/07/2010)    Lipid flowsheet reviewed?: Yes   Progress toward LDL goal: Deteriorated  Hypertension   Last Blood Pressure: 128 / 64  (03/11/2010)   Serum creatinine: 0.71  (03/07/2010)   Serum potassium 4.6  (03/07/2010)    Hypertension flowsheet reviewed?: Yes   Progress toward BP goal: At goal  Self-Management  Support :   Personal Goals (by the next clinic visit) :      Personal blood pressure goal: 130/80  (03/11/2010)     Personal LDL goal: 100  (03/11/2010)    Hypertension  self-management support: Not documented    Lipid self-management support: Not documented

## 2010-05-02 NOTE — Assessment & Plan Note (Signed)
Summary: boniva inj,tcb   Vital Signs:  Patient profile:   77 year old female Height:      63.5 inches Weight:      114 pounds BMI:     19.95 Temp:     97.5 degrees F oral Pulse rate:   77 / minute BP sitting:   122 / 69  (right arm) Cuff size:   regular  Vitals Entered By: Tessie Fass CMA (Aug 03, 2009 8:36 AM) CC: boniva injection   Primary Care Provider:  CAT TA MD  CC:  boniva injection.  History of Present Illness: Doing well, no complaints.  Tolerating injections, no side effects.  Denies leg pain.  Last creatitine 0.86 3 months ago with last injection.  Allergies: 1)  Sulfamethoxazole (Sulfamethoxazole)  Physical Exam  General:  Well-developed,well-nourished,in no acute distress; alert,appropriate and cooperative throughout examination   Impression & Recommendations:  Problem # 1:  OSTEOPOROSIS, UNSPECIFIED (ICD-733.00)  Lot [id number] exp 6/12; IVP Boniva given without complications, in right antecubital vein. Her updated medication list for this problem includes:    Calcium 600 600 Mg Tabs (Calcium carbonate) .Marland Kitchen... 1 tablet by mouth bid    Boniva 3 Mg/61ml Kit (Ibandronate sodium)  Orders: Provider Misc Charge- [facility] (Misc)  Complete Medication List: 1)  Aspirin Ec 81 Mg Tbec (Aspirin) .... Take 1 tablet by mouth bid 2)  Coreg 6.25 Mg Tabs (Carvedilol) .Marland Kitchen.. 1 tablet by mouth bid 3)  Micardis 40 Mg Tabs (Telmisartan) .... One tablet by mouth daily 4)  Lanoxin 0.125 Mg Tabs (Digoxin) .... Take 1 tablet by mouth once a day 5)  Lasix 40 Mg Tabs (Furosemide) .... Take 1 tablet by mouth once a day as needed breathlessness or swelling 6)  Red Yeast Rice 600 Mg Caps (Red yeast rice extract) .Marland Kitchen.. 1 tablet by mouth bid 7)  Calcium 600 600 Mg Tabs (Calcium carbonate) .Marland Kitchen.. 1 tablet by mouth bid 8)  Boniva 3 Mg/10ml Kit (Ibandronate sodium) 9)  Spironolactone 25 Mg Tabs (Spironolactone) .... 1/2 tablet by mouth daily  Patient Instructions: 1)  Return in 3 months

## 2010-05-02 NOTE — Letter (Signed)
Summary: Generic Letter  [facility]  [address]   [place], [place] [address]   Phone: [phone]  Fax: [phone]    05/01/2009  [name] [address] [place], [place]  [address]  Dear Ms. Buescher,   Your kidney function is completely normal.  See you in 3 months.        Sincerely,   Luretha Murphy NP  Appended Document: Generic Letter mailed

## 2010-05-02 NOTE — Assessment & Plan Note (Signed)
Summary: Boniva Inj,tcb   Vital Signs:  Patient profile:   77 year old female Height:      63.5 inches Weight:      114 pounds BMI:     19.95 Temp:     97.7 degrees F oral Pulse rate:   70 / minute BP sitting:   135 / 82  (left arm) Cuff size:   regular  Vitals Entered By: Tessie Fass CMA (April 30, 2009 8:40 AM) CC: boniva Is Patient Diabetic? No Pain Assessment Patient in pain? no        Primary Care Provider:  CAT TA MD  CC:  boniva.  History of Present Illness: Here for IVP Boniva, has no complaints.  Discussed projected lenght of treatment as being total of 5 years.  She is to reprot any unusual bone pain that persists.  Habits & Providers  Alcohol-Tobacco-Diet     Tobacco Status: quit  Current Medications (verified): 1)  Aspirin Ec 81 Mg Tbec (Aspirin) .... Take 1 Tablet By Mouth Bid 2)  Coreg 6.25 Mg Tabs (Carvedilol) .Marland Kitchen.. 1 Tablet By Mouth Bid 3)  Micardis 40 Mg Tabs (Telmisartan) .... One Tablet By Mouth Daily 4)  Lanoxin 0.125 Mg Tabs (Digoxin) .... Take 1 Tablet By Mouth Once A Day 5)  Lasix 40 Mg Tabs (Furosemide) .... Take 1 Tablet By Mouth Once A Day As Needed Breathlessness or Swelling 6)  Red Yeast Rice 600 Mg Caps (Red Yeast Rice Extract) .Marland Kitchen.. 1 Tablet By Mouth Bid 7)  Calcium 600 600 Mg Tabs (Calcium Carbonate) .Marland Kitchen.. 1 Tablet By Mouth Bid 8)  Boniva 3 Mg/38ml Kit (Ibandronate Sodium) 9)  Spironolactone 25 Mg Tabs (Spironolactone) .... 1/2 Tablet By Mouth Daily  Allergies (verified): 1)  Sulfamethoxazole (Sulfamethoxazole)  Social History: Smoking Status:  quit  Physical Exam  General:  Well-developed,well-nourished,in no acute distress; alert,appropriate and cooperative throughout examination   Impression & Recommendations:  Problem # 1:  OSTEOPOROSIS, UNSPECIFIED (ICD-733.00)  IVP Boniva given without complications, BMET to monitor renal function. Her updated medication list for this problem includes:    Calcium 600 600 Mg Tabs  (Calcium carbonate) .Marland Kitchen... 1 tablet by mouth bid    Boniva 3 Mg/51ml Kit (Ibandronate sodium)-lot [id number], exp 05/12  Orders: Provider Misc Charge- [facility] (Misc)  Complete Medication List: 1)  Aspirin Ec 81 Mg Tbec (Aspirin) .... Take 1 tablet by mouth bid 2)  Coreg 6.25 Mg Tabs (Carvedilol) .Marland Kitchen.. 1 tablet by mouth bid 3)  Micardis 40 Mg Tabs (Telmisartan) .... One tablet by mouth daily 4)  Lanoxin 0.125 Mg Tabs (Digoxin) .... Take 1 tablet by mouth once a day 5)  Lasix 40 Mg Tabs (Furosemide) .... Take 1 tablet by mouth once a day as needed breathlessness or swelling 6)  Red Yeast Rice 600 Mg Caps (Red yeast rice extract) .Marland Kitchen.. 1 tablet by mouth bid 7)  Calcium 600 600 Mg Tabs (Calcium carbonate) .Marland Kitchen.. 1 tablet by mouth bid 8)  Boniva 3 Mg/32ml Kit (Ibandronate sodium) 9)  Spironolactone 25 Mg Tabs (Spironolactone) .... 1/2 tablet by mouth daily  Other Orders: [facility] [phone])

## 2010-06-11 ENCOUNTER — Ambulatory Visit (INDEPENDENT_AMBULATORY_CARE_PROVIDER_SITE_OTHER): Payer: Medicare HMO | Admitting: Family Medicine

## 2010-06-11 VITALS — BP 110/60 | Temp 97.8°F | Ht 63.5 in | Wt 116.0 lb

## 2010-06-11 DIAGNOSIS — M810 Age-related osteoporosis without current pathological fracture: Secondary | ICD-10-CM

## 2010-06-11 NOTE — Progress Notes (Signed)
  Subjective:    Patient ID: [name], female    DOB: [birth year], 77 y.o.   MRN: [id number]  HPI Patient is here for IVP Boniva.  She changed Medicare plan and was worried about her co-pay.  She thinks it is around 60$.  She is tired, just not feeling as well as ususal.    Review of Systems  Constitutional: Positive for fatigue. Negative for fever.  Cardiovascular: Negative for chest pain.       Objective:   Physical Exam  Constitutional:       Alert, thin, 77 year old female in no acute distress          Assessment & Plan:

## 2010-06-11 NOTE — Assessment & Plan Note (Signed)
Boniva given IVP 3 mg into the right antecubital vein, excellent blood return, patient tolerated procedure well.

## 2010-06-19 LAB — CBC
HCT: 41.6 % (ref 36.0–46.0)
Platelets: 150 10*3/uL (ref 150–400)
WBC: 8.9 10*3/uL (ref 4.0–10.5)

## 2010-06-19 LAB — DIFFERENTIAL
Basophils Absolute: 0 10*3/uL (ref 0.0–0.1)
Basophils Relative: 0 % (ref 0–1)
Eosinophils Absolute: 0.1 10*3/uL (ref 0.0–0.7)
Eosinophils Relative: 1 % (ref 0–5)
Monocytes Absolute: 0.6 10*3/uL (ref 0.1–1.0)
Neutro Abs: 6.8 10*3/uL (ref 1.7–7.7)

## 2010-06-19 LAB — URINALYSIS, ROUTINE W REFLEX MICROSCOPIC
Bilirubin Urine: NEGATIVE
Ketones, ur: NEGATIVE mg/dL
Nitrite: POSITIVE — AB
Protein, ur: NEGATIVE mg/dL
pH: 6 (ref 5.0–8.0)

## 2010-06-19 LAB — COMPREHENSIVE METABOLIC PANEL
AST: 31 U/L (ref 0–37)
Albumin: 3.3 g/dL — ABNORMAL LOW (ref 3.5–5.2)
Alkaline Phosphatase: 46 U/L (ref 39–117)
BUN: 23 mg/dL (ref 6–23)
Chloride: 97 mEq/L (ref 96–112)
Potassium: 4.4 mEq/L (ref 3.5–5.1)
Total Bilirubin: 1.1 mg/dL (ref 0.3–1.2)

## 2010-06-19 LAB — URINE CULTURE: Colony Count: >=100000

## 2010-06-19 LAB — URINE MICROSCOPIC-ADD ON

## 2010-06-24 ENCOUNTER — Ambulatory Visit
Admission: RE | Admit: 2010-06-24 | Discharge: 2010-06-24 | Disposition: A | Discharge status: Home / Self Care | Payer: Medicare HMO | Source: Ambulatory Visit | Attending: Cardiovascular Disease | Admitting: Cardiovascular Disease

## 2010-06-24 ENCOUNTER — Other Ambulatory Visit: Payer: Self-pay | Admitting: Cardiovascular Disease

## 2010-06-24 DIAGNOSIS — I10 Essential (primary) hypertension: Secondary | ICD-10-CM

## 2010-06-28 ENCOUNTER — Ambulatory Visit (HOSPITAL_COMMUNITY)
Admission: RE | Admit: 2010-06-28 | Discharge: 2010-06-28 | Disposition: A | Discharge status: Home / Self Care | Payer: Medicare HMO | Source: Ambulatory Visit | Attending: Cardiovascular Disease | Admitting: Cardiovascular Disease

## 2010-06-28 ENCOUNTER — Telehealth: Payer: Self-pay | Admitting: Family Medicine

## 2010-06-28 DIAGNOSIS — Z4502 Encounter for adjustment and management of automatic implantable cardiac defibrillator: Secondary | ICD-10-CM | POA: Insufficient documentation

## 2010-06-28 DIAGNOSIS — I509 Heart failure, unspecified: Secondary | ICD-10-CM | POA: Insufficient documentation

## 2010-06-28 DIAGNOSIS — I447 Left bundle-branch block, unspecified: Secondary | ICD-10-CM | POA: Insufficient documentation

## 2010-06-28 LAB — SURGICAL PCR SCREEN: Staphylococcus aureus: NEGATIVE

## 2010-06-28 NOTE — Telephone Encounter (Signed)
Pt is returning call from [employer]

## 2010-06-28 NOTE — Telephone Encounter (Signed)
I did not call this patient or see any documentation of anyone calling her.Busick, Rodena Medin

## 2010-07-02 ENCOUNTER — Encounter: Payer: Self-pay | Admitting: Home Health Services

## 2010-07-07 LAB — CBC
HCT: 39.5 % (ref 36.0–46.0)
Hemoglobin: 13.5 g/dL (ref 12.0–15.0)
MCHC: 34.2 g/dL (ref 30.0–36.0)
Platelets: 129 10*3/uL — ABNORMAL LOW (ref 150–400)
RDW: 14.4 % (ref 11.5–15.5)

## 2010-07-07 LAB — BASIC METABOLIC PANEL
BUN: 9 mg/dL (ref 6–23)
CO2: 28 mEq/L (ref 19–32)
Glucose, Bld: 103 mg/dL — ABNORMAL HIGH (ref 70–99)
Potassium: 4.5 mEq/L (ref 3.5–5.1)
Sodium: 139 mEq/L (ref 135–145)

## 2010-07-07 LAB — HEPATIC FUNCTION PANEL
AST: 23 U/L (ref 0–37)
Bilirubin, Direct: 0.1 mg/dL (ref 0.0–0.3)
Indirect Bilirubin: 0.6 mg/dL (ref 0.3–0.9)
Total Bilirubin: 0.7 mg/dL (ref 0.3–1.2)

## 2010-07-07 LAB — HEMOGLOBIN A1C
Hgb A1c MFr Bld: 5.6 % (ref 4.6–6.1)
Mean Plasma Glucose: 114 mg/dL

## 2010-07-07 LAB — TROPONIN I
Troponin I: 0.01 ng/mL (ref 0.00–0.06)
Troponin I: 0.01 ng/mL (ref 0.00–0.06)

## 2010-07-07 LAB — POCT CARDIAC MARKERS
CKMB, poc: <1 ng/mL — ABNORMAL LOW (ref 1.0–8.0)
Troponin i, poc: <0.05 ng/mL (ref 0.00–0.09)

## 2010-07-07 LAB — GLUCOSE, CAPILLARY: Glucose-Capillary: 107 mg/dL — ABNORMAL HIGH (ref 70–99)

## 2010-07-07 LAB — DIFFERENTIAL
Basophils Absolute: 0 10*3/uL (ref 0.0–0.1)
Basophils Relative: 0 % (ref 0–1)
Eosinophils Absolute: 0.1 10*3/uL (ref 0.0–0.7)
Eosinophils Relative: 2 % (ref 0–5)
Monocytes Absolute: 0.5 10*3/uL (ref 0.1–1.0)

## 2010-07-07 LAB — TSH: TSH: 2.658 u[IU]/mL (ref 0.350–4.500)

## 2010-07-07 LAB — CK TOTAL AND CKMB (NOT AT ARMC)
CK, MB: 0.8 ng/mL (ref 0.3–4.0)
Relative Index: INVALID (ref 0.0–2.5)
Total CK: 39 U/L (ref 7–177)
Total CK: 46 U/L (ref 7–177)

## 2010-07-07 LAB — PROTIME-INR
INR: 1.1 (ref 0.00–1.49)
Prothrombin Time: 14.7 seconds (ref 11.6–15.2)

## 2010-07-15 NOTE — Op Note (Signed)
[name], [name]                  ACCOUNT NO.:  [id number]  MEDICAL RECORD NO.:  [id number]           PATIENT TYPE:  O  LOCATION:  [facility]                         FACILITY:  [facility]  PHYSICIAN:  Thurmon Fair, MD     DATE OF BIRTH:  11/10/1929  DATE OF PROCEDURE: DATE OF DISCHARGE:                              OPERATIVE REPORT   PROCEDURES PERFORMED: 1. Removal of old defibrillator generator. 2. Implantation of new biventricular pacemaker/defibrillator     generator. 3. Moderate sedation.  REASON FOR THE PROCEDURE:  Device at elective replacement interval; congestive heart failure New York Heart Association functional class II, left bundle-branch block, depressed left ventricular systolic function.  DEVICE DETAILS:  The generator that was removed was a Systems analyst II HF, model number [id number].  The newly implanted generator was a Air traffic controller, model number CD 3231 - 40, serial number [id number].  The chronic leads (implanted in October 09, 2005, were as follows):  Right atrial lead [employer] [id number] TC-46, serial number [id number].  Right ventricular lead [employer] 7020/60 (Optim), serial number [id number].  Left ventricular lead [employer] 1058 T/75, serial number [id number].  After risks and benefits of the procedure were described, the patient provided informed consent was brought to the cardiac cath lab in a fasting state.  She was prepped and draped in usual sterile fashion. Local anesthesia with 1% lidocaine was administered to the previous defibrillator site in the left prepectoral area.  A 5-6 cm horizontal incision was made at the level of the previous scar. Using careful electrocautery and mostly blunt dissection, the old pocket was opened and the device was exposed and removed from the pocket. Great attention was given to ensure excellent hemostasis.  The pocket was flushed with copious amounts of antibiotic solution.  The  chronic leads were removed from the device and connected to the new generator. Using remote radiofrequency monitoring, the three leads were tested and found to have excellent parameters unchanged from before the device explantation.  By visual inspection, the leads did not appear to be injured.  The pocket was flushed with copious amounts of antibiotic solution after which the new generator was placed in the pocket and secured in place with a 0-0 silk loose suture.  The pocket was then closed using two layers of 2-0 Vicryl and cutaneous staples after which a sterile dressing was applied.  No immediate complications were noted.  The estimated blood loss was less than 5 mL.  PROCEDURE PERFORMED:  Thurmon Fair, MD  ASSISTANT:  Dr. Karleen Hampshire.  MEDICATIONS ADMINISTERED:  Ancef 1 gram intravenously, lidocaine 1% 20 mL, Versed 3 mg IV, fentanyl 50 mcg IV.  The following electronic parameters were encountered on retesting of the chronic leads at the end of the procedure.  Right atrium sensed P-waves 4.0 mV, threshold 0.7 volts at 0.5 milliseconds pulse width, impedance 390 ohms.  Right ventricular lead sensed R-waves 8.7 mV, threshold 0.5 volts at 0.5 milliseconds pulse width, impedance 150 ohms, high-voltage impedance 47 ohms.  Left ventricular lead threshold 0.7 volts at  0.5 milliseconds pulse width (configuration LV tip to RV coil, impedance 300 ohms).  The bradycardia settings and tachycardia detection and therapy settings were unchanged from preprocedure.     Thurmon Fair, MD     MC/MEDQ  D:  06/28/2010  T:  06/29/2010  Job:  [id number]  cc:   Thereasa Solo. Little, M.D.  Electronically Signed by Thurmon Fair M.D. on 07/15/2010 03:57:33 PM

## 2010-07-22 ENCOUNTER — Encounter: Payer: Self-pay | Admitting: Family Medicine

## 2010-07-22 ENCOUNTER — Ambulatory Visit (INDEPENDENT_AMBULATORY_CARE_PROVIDER_SITE_OTHER): Payer: Medicare HMO | Admitting: Family Medicine

## 2010-07-22 VITALS — BP 148/79 | HR 77 | Temp 98.2°F | Ht 64.0 in | Wt 118.0 lb

## 2010-07-22 DIAGNOSIS — Z0000 Encounter for general adult medical examination without abnormal findings: Secondary | ICD-10-CM

## 2010-07-22 NOTE — Patient Instructions (Signed)
1. Continue to work out at [employer] 2-3 times a week. 2. Focus on eating 3-4 vegetables every day. 3. Continue to take all medicines daily.

## 2010-07-22 NOTE — Progress Notes (Signed)
Patient here for annual wellness visit, patient reports: Risk Factors/Conditions needing evaluation or treatment: Patient does not have any risk factors that need evaluation. Home Safety: Patient lives with handicapped son in 1 story home.  Patient reports having smoke detectors and adaptive equipment in the bathroom. Other Information: Primary care giver for 75 yr old handicapped son.  Corrective lens: Patient wears corrective lens daily and visits eye doctor every 2-3 years. Dentures: Patient has full dentures and posts and visits dentist every 6 months. Memory: Patient reports some memory loss. Patient's Mini Mental Score (recorded in doc. flowsheet): 30  Balance max value patientvalue  Sitting balance 1 1  Arise 2 2  Attempts to arise 2 2  Immediate standing balance 2 2  Standing balance 1 1  Nudge 2 2  Eyes closed 1 1  360 degree turn 1 1  Sitting down 2 2   Gait max value patient value  Initiation of gait 1 1  Step length-left 1 1  Step length-right 1 1  Step height-left 1 1  Step height-right 1 1  Step symmetry 1 1  Step continuity 1 1  Path 2 2  Trunk 2 2  Walking stance 1 1   Balance/Gait Score: 26/26    Annual Wellness Visit Requirements Recorded Today In  Medical, family, social history Past Medical, Family, Social History Section  Current providers Care team  Current medications Medications  Wt, BP, Ht, BMI Vital signs  Hearing assessment (welcome visit) Hearing/ Vision  Tobacco, alcohol, illicit drug use History  ADL Nurse Assessment  Depression Screening Nurse Assessment  Cognitive impairment Nurse Assessment  Mini Mental Status Document Flowsheet  Fall Risk Nurse Assessment  Home Safety Progress Note  End of Life Planning (welcome visit) Social Documentation  Medicare preventative services Progress Note  Risk factors/conditions needing evaluation/treatment Progress Note  Personalized health advice Patient Instructions, goals, letter  Diet &  Exercise Social Documentation  Emergency Contact Social Documentation  Seat Belts Social Documentation  Sun exposure/protection Social Documentation    Prevention Plan: Patient declined HIV test, Shingles vaccine, and Colonoscopy.   Recommended Medicare Prevention Screenings Women over 108 Test For Frequency Date of Last- BOLD if needed  Breast Cancer 1-2 yrs 2/10  Cervical Cancer 1-3 yrs 1992  Colorectal Cancer 1-10 yrs declined  Osteoporosis once 5/08  Cholesterol 5 yrs 12/11  Diabetes yearly Non diabetic  HIV yearly declined  Influenza Shot yearly 10/11  Pneumonia Shot once 3/99  Zostavax Shot once declined   Patient had shingles in 2010  Surgery: [employer] [facility] 06/28/10 [facility]  [id number] Rev. D

## 2010-07-23 ENCOUNTER — Encounter: Payer: Self-pay | Admitting: Family Medicine

## 2010-07-23 NOTE — Progress Notes (Signed)
I have reviewed this visit and discussed with Suzanne Lineberry and agree with her documentation  

## 2010-08-13 NOTE — Discharge Summary (Signed)
NAMENegin, Hegg Geraline                  ACCOUNT NO.:  [id number]   MEDICAL RECORD NO.:  [id number]          PATIENT TYPE:  INP   LOCATION:  3713                         FACILITY:  [facility]   PHYSICIAN:  Thereasa Solo. Little, M.D. DATE OF BIRTH:  14-Jun-1929   DATE OF ADMISSION:  09/28/2008  DATE OF DISCHARGE:  09/29/2008                               DISCHARGE SUMMARY   Ms. Perkins is a 77 year old Caucasian female with known history of  nonischemic cardiomyopathy with patent coronaries by cath on March 26, 2005, with an EF of 25%.  She had heart failure at that time, and  throughout the years she has undergone a BiV ICD on October 09, 2005, at  [facility] with Dr. Sherlene Shams.  She has been a responder.  Last echo on  January 11, 2008, showed an EF of 35-45% and some diastolic dysfunction.  She has not had any hospitalizations recently for any CHF.  She came to  the emergency room on the morning of September 28, 2008, after taking her  pills.  In the morning, she was fixing her breakfast and she had some  severe lower midsternal chest pain upper epigastric area.  She was with  her 11-year-old grandson and she decided to call 911.  She was admitted  to the hospital overnight for observation.  She had a chest x-ray that  showed questionable nodules, so she went on to have a CT scan of her  chest which showed no discussion of the nodule, no aortic dissection, no  pulmonary embolism.  She had severe emphysema, mild cardiomegaly.  She  had some coronary atherosclerosis and probable constipation and disk  bulge in L3 to L4.  Her CK-MB and troponins were all negative, and her  labs were all appropriate.  On the morning of September 29, 2008, she was  feeling fine.  She had no more discomfort and her blood pressure was  129/69, heart rate was 74, she was pacing, and respirations were 18,  temperature was 96.9.  It was decided she was stable to be discharged  home.  She will follow up in the office with Dr. Clarene Duke.   OTHER LABORATORY DATA:  Sodium 139, potassium 4.5, BUN 9, creatinine  1.03.  Hemoglobin 13.5, hematocrit 39.5, WBC 6.1, and platelets 129.  She did refuse any further lab tests after September 28, 2008, and she also  took her own medications from home.  D-dimer was 0.34.  TSH was 2.658,  and hemoglobin A1c was 5.6.  AST was 23.  ALT was 13.  CK-MB and  troponins were all negative.  Magnesium was 1.9.   DISCHARGE MEDICATIONS:  1. Aspirin 81 mg a day.  2. Calcium 600 mg b.i.d.  3. Micardis 40 mg a day.  4. Lasix 40 mg daily.  5. Digoxin 0.125 mg daily.  6. Spirolactone 12.5 mg daily.  7. Coreg 6.25 mg twice per day.  8. Red rice yeast 600 mg 2 twice per day.  9. Omega-3 fish oil 2 everyday.   She was advised to take Pepcid 20 mg a day for  possible indigestion,  esophagitis, or gastritis as cause of her chest pain, Colace 200 mg  daily which she does take at home now, and MiraLax 17 g daily or as  needed for her constipation problems.  Our office will call her for  followup to see Dr. Clarene Duke, and she knows to call if she has any further  problems.   DISCHARGE DIAGNOSES:  1. Chest pain/epigastric pain, questionable esophageal spasm occurred      shortly after taking morning medicines before eating versus      esophagitis, gastritis.  She has a history of normal coronaries in      2006, and her CK-MB and troponins were all negative.  She is on      calcium.  2. Left bundle-branch block.  3. Nonischemic cardiomyopathy with improved ejection fraction after      biventricular implantable cardioverter-defibrillator placed with      ejection fraction now 35-45%.  4. Hyperlipidemia, intolerant to STATINS, takes red rice yeast and      fish oil.  5. Severe emphysema by CT scan.  6. Constipation.  7. Disk bulge L3 to L4.  8. History of biventricular implantable cardioverter-defibrillator      placement on October 09, 2005, with a St. Jude model.  9. Patent coronary arteries by cath on March 26, 2005.      Lezlie Octave, N.P.    ______________________________  Thereasa Solo. Little, M.D.    BB/MEDQ  D:  09/29/2008  T:  09/30/2008  Job:  [id number]   cc:   Ace Gins, MD

## 2010-08-16 NOTE — Discharge Summary (Signed)
NAMEKolbi, [name]                  ACCOUNT NO.:  [id number]   MEDICAL RECORD NO.:  [id number]          PATIENT TYPE:  INP   LOCATION:  3704                         FACILITY:  [facility]   PHYSICIAN:  Thereasa Solo. Little, M.D. DATE OF BIRTH:  1930/01/12   DATE OF ADMISSION:  04/03/2005  DATE OF DISCHARGE:  04/12/2005                                 DISCHARGE SUMMARY   [name] was admitted to the hospital with congestive heart failure for  further evaluation and treatment.  This was acute systolic dysfunction  recently diagnosed.  She apparently has nonischemic cardiomyopathy.  She  underwent cardiac catheterization which revealed normal coronary arteries on  March 26, 2005.  She has been placed on medications but failed  improvement as an outpatient, so she was admitted for further diuresis.  We  had difficulty diuresing her secondary to very low blood pressure.  Thus,  she has combined COPD.  Thus, a pulmonary consult was called.  She was put  on prednisone.  DuoNebs were added.  Her shortness of breath seemed out of  proportion to either her cardiomyopathy and her underlying lung problems.  Thus, a D-dimer was ordered.  This came back within normal limits.  We also  did lower extremity Dopplers which did not show any DVT.  Her BNP was 459.  She was seen by cardiac rehab.  She walked in the hall.  She initially  needed oxygen because of desaturation with ambulation.  We increased her  diuretics on April 11, 2005 and April 12, 2005.  She was dramatically  improved.  Thus, it was decided to discharge her home.  She was able to wall  in the hall without any further desaturation.  Her O2 saturations were 96%.   LABORATORY DATA:  Hemoglobin was 14.5.  Hematocrit was 43.  BUN was 16.  Creatinine was 0.9.  Sodium 133, potassium 3.7.  CK-MBs and troponins were  negative.  Her BNPs were 427, 201, and 459.  Dig level was less than 0.2.  Urine cultures were greater than 100,000 colonies of  Enterobacter.  She was  treated with Cipro.   DISCHARGE MEDICATIONS:  1.  Aspirin 81 mg one time per day.  2.  Digoxin 0.125 mg one time per day.  3.  Diovan 40 mg one time per day.  4.  Coreg 3.125 mg two times per day.  5.  Prednisone 10 mg once a day for three days, then she is to stop it.  6.  Cipro 250 mg two times per day for two more days, then MDI 2 puffs four      times per day.  7.  Lasix 40 mg one time per day.  8.  Albuterol MDI every four hours if needed for shortness of breath or      wheezing.  9.  Protonix 40 mg two times per day.  10. Potassium chloride 10 mEq one time per day.   She will follow up with Dr. Clarene Duke in 7-10 days.  She will follow up with  Dr. Delton Coombes in 2-3 weeks.  DISCHARGE DIAGNOSES:  1.  Acute systolic congestive heart failure.  2.  Nonischemic cardiomyopathy with an ejection fraction of about 25% by      cardiac catheterization.  3.  Normal coronary arteries by cardiac catheterization,  March 26, 2005      at [facility].  4.  Chronic obstructive pulmonary disease.  5.  Hypertension.  6.  Urinary tract infection treated with Cipro.  7.  Gastroesophageal reflux disease.      [name], N.P.    ______________________________  Thereasa Solo. Little, M.D.    BB/MEDQ  D:  05/25/2005  T:  05/26/2005  Job:  [id number]   cc:   Leslye Peer, M.D.

## 2010-08-16 NOTE — H&P (Signed)
NAMEKimila, [name]                  ACCOUNT NO.:  [id number]   MEDICAL RECORD NO.:  [id number]          PATIENT TYPE:  INP   LOCATION:  3729                         FACILITY:  [facility]   PHYSICIAN:  Nanetta Batty, M.D.   DATE OF BIRTH:  1930/03/10   DATE OF ADMISSION:  04/03/2005  DATE OF DISCHARGE:                                HISTORY & PHYSICAL   HISTORY OF PRESENT ILLNESS:  [name] is a 77 year old white widowed  female patient of Dr. Clayborne Dana and Dr. Ace Gins who was seen  in the office today post cardiac catheterization.  She was initially seen by  Dr. Allyson Sabal on March 21, 2005, referred by Dr. Ace Gins, with a  history of shortness of breath, tightness and heaviness in her chest.  She  went on to have a Cardiolite.  Her EF was 29%.  She had some mild apical  ischemia.  She went on to undergo cardiac catheterization at [facility] on March 26, 2005, which revealed normal coronary arteries  and an EF of 25%, thus she was diagnosed with non-ischemic cardiomyopathy.  She comes back today in follow up.  Apparently after her cardiac  catheterization in the post procedure area, she developed acute shortness of  breath.  She was given IV Lasix 40 mg.  She apparently started feeling  better and opted to go home instead of going to the hospital.  A BNP was  checked and was 951.  She was given a prescription for Lasix 40 mg everyday  which she took for several days and then it was decreased down to 20 mg  everyday.  She comes in the office today, again, she feels terrible, she has  been unable to lie down flat to sleep, she has PND, she has severe coughing  to the point where she thinks her chest may explode.  She was seen by Dr.  Allyson Sabal.  We titrated her medications, however, the patient got home, she took  an extra dose of Lasix and she has not urinated all day.  Thus, her daughter  called the office and it was decided that she should be admitted  for  congestive heart failure.   PAST MEDICAL HISTORY:  Nonischemic cardiomyopathy as stated above,  congestive heart failure class IV.  She has no hypertension.  She has no  tobacco use, she quit 3-4 years ago.  No diabetes, no elevated cholesterol,  positive for premature family history of heart disease.  She has recurrent  UTIs and COPD.  She has had a hysterectomy and appendectomy, bladder tack  and tonsillectomy.   MEDICATIONS:  Aspirin 81 mg daily, stool softener 2 everyday, Metoprolol ER  25 mg everyday, Albuterol inhaler p.r.n., Altace 5 mg everyday, Furosemide  20 mg everyday, Klor-Con 20 mEq daily.  Today, after being seen, we had  decided to stop her Altace and start her on Cozaar 50 mg everyday and  increase her Lasix to 40 mg daily.  She was to go home and take an extra  Lasix 40 mg as soon as she  got home which should have been around noon.  She  was told not to start her Cozaar until the following night.   FAMILY HISTORY:  Mother deceased at 76 of cardiomegaly.  Father deceased at  71 of bacterial infection.  She does not have any idea about her  grandparents.  She has a brother with cardiomegaly who is deceased, another  brother alive who has had cancer, another brother age 46 in good health.  She has a sister who has heart problems and multiple strokes, she is  deceased at age 24.   SOCIAL HISTORY:  She is widowed, she has four children and eight  grandchildren.  She lives with her son.  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  She has four years of college.  She does not  drink any alcohol, she is 28 1/2 years in recovery.  She does not smoke now.  She was exercising.   EKG not done today.  Her last EKG showed left bundle branch block.   REVIEW OF SYSTEMS:  She denies fever, cold chills, nausea and vomiting,  indigestion, heartburn.  Some lower extremity edema.  Positive shortness of  breath, positive PND, positive severe cough.  No diarrhea.  No  presyncope or  syncope.  No palpitations.   PHYSICAL EXAMINATION:  VITAL SIGNS:  Blood pressure 100/64, weight 117, last office visit was 122,  height 5 foot 5 inches, heart rate. 96.  GENERAL:  She is alert and oriented, coughing intermittently.  SKIN:  Warm and dry.  LUNGS:  Crackles bilaterally.  HEART:  Heart sounds are regular, S3, and questionable diastolic murmur.  ABDOMEN:  Bowel sounds present x 4, no masses or tenderness.  EXTREMITIES:  Some ankle edema, moves all extremities x 4.  NEUROLOGICAL:  She is alert and oriented x 3.   ASSESSMENT:  1.  CHF, acute on chronic, class 3-4 heart failure.  2.  Normal coronary arteries.  3.  Nonischemic cardiomyopathy, EF 25%.  4.  Premature family history of heart disease.  5.  Left bundle branch block.   An echo done March 28, 2005, shows an EF of 25%, she had moderate to  severe HK, mild MR.   PLAN:  Again, we were going to try to adjust her medications as an  outpatient, however, her daughter called late this afternoon and stated she  had not urinated anymore today.  They are very concerned.  She apparently  lives with a handicapped son.  Her daughter lives in another town.  Thus, it  was decided to admit her to treat her for congestive heart failure under  observation.      Lezlie Octave, N.P.      Nanetta Batty, M.D.  Electronically Signed    BB/MEDQ  D:  04/03/2005  T:  04/03/2005  Job:  [id number]

## 2010-08-22 ENCOUNTER — Ambulatory Visit (INDEPENDENT_AMBULATORY_CARE_PROVIDER_SITE_OTHER): Payer: Medicare HMO | Admitting: Family Medicine

## 2010-08-22 ENCOUNTER — Encounter: Payer: Self-pay | Admitting: Family Medicine

## 2010-08-22 VITALS — BP 140/69 | HR 68 | Ht 63.5 in | Wt 115.2 lb

## 2010-08-22 DIAGNOSIS — D1730 Benign lipomatous neoplasm of skin and subcutaneous tissue of unspecified sites: Secondary | ICD-10-CM

## 2010-08-22 DIAGNOSIS — D1739 Benign lipomatous neoplasm of skin and subcutaneous tissue of other sites: Secondary | ICD-10-CM

## 2010-08-22 NOTE — Assessment & Plan Note (Signed)
Lipoma on left arm distal to elbow and on left knee. They do not bother pt and are not growing in size. Discussed options and pt has decided to just monitor for now.

## 2010-08-22 NOTE — Patient Instructions (Signed)
Your next appt will be in December with your new doctor.   You will have labs ordered for that visit.

## 2010-08-22 NOTE — Progress Notes (Signed)
  Subjective:    Patient ID: [name], female    DOB: [birth year], 77 y.o.   MRN: [id number]  HPI Pt is here for routine 34-month follow-up   Soft lesions on Left arm and L knee: The one on the arm has been present for months. The one on the knee longer.  Not growing in size.  Do not cause pain. Do not bother pt.   Defibrillator: New [employer] Jude defrib was implanted on 06/29/2010.  Review of labs:  Next due in Dec 2012 (FLP, CMet, CBC)  Pmhx, Pshx, Fhx, Shx reviewed and no changes.  Review of Systems  Constitutional: Negative for fever, chills and appetite change.  HENT: Negative for congestion, rhinorrhea and neck stiffness.   Eyes: Negative for itching and visual disturbance.  Respiratory: Negative for cough, chest tightness and shortness of breath.   Cardiovascular: Negative for chest pain, palpitations and leg swelling.  Gastrointestinal: Negative for nausea, vomiting, diarrhea, constipation and blood in stool.  Musculoskeletal: Negative for back pain.  Skin: Negative for rash.  Neurological: Negative for dizziness, tremors, weakness and numbness.       Objective:   Physical Exam  Constitutional: She is oriented to person, place, and time. She appears well-developed and well-nourished. No distress.  Neck: Normal range of motion. Neck supple.  Cardiovascular: Normal rate, regular rhythm, normal heart sounds and intact distal pulses.   No murmur heard. Pulmonary/Chest: Effort normal and breath sounds normal. She has no wheezes.  Abdominal: Soft. Bowel sounds are normal. She exhibits no distension. There is no tenderness.  Musculoskeletal: Normal range of motion.       Left arm: on posterior aspect, distal to elbow there is a 2cm soft mass, mobile, nontender, no erythema.   Left knee:anterior aspect and on medial side of patella there is a 3cm mass that is very soft, mobile, nontender, no erythema. No decrease ROM.   Lymphadenopathy:    She has no cervical adenopathy.    Neurological: She is alert and oriented to person, place, and time.  Psychiatric: She has a normal mood and affect.          Assessment & Plan:

## 2010-09-27 ENCOUNTER — Ambulatory Visit (INDEPENDENT_AMBULATORY_CARE_PROVIDER_SITE_OTHER): Payer: Medicare HMO | Admitting: Family Medicine

## 2010-09-27 DIAGNOSIS — M810 Age-related osteoporosis without current pathological fracture: Secondary | ICD-10-CM

## 2010-09-27 DIAGNOSIS — Z5181 Encounter for therapeutic drug level monitoring: Secondary | ICD-10-CM

## 2010-09-27 LAB — BASIC METABOLIC PANEL
Potassium: 4.3 mEq/L (ref 3.5–5.3)
Sodium: 140 mEq/L (ref 135–145)

## 2010-09-27 NOTE — Progress Notes (Signed)
  Subjective:    Patient ID: [name], female    DOB: 1930/01/11, 77 y.o.   MRN: [id number]  HPI Here for IVP Boniva.  She denies bone pain or flu like symptoms.   Review of Systems  All other systems reviewed and are negative.       Objective:   Physical Exam  Constitutional: She appears well-developed and well-nourished.          Assessment & Plan:

## 2010-09-27 NOTE — Assessment & Plan Note (Signed)
3 mg Boniva given IVP in the right antecubital vein, no complications.  BMET checked to ensure normal renal function.

## 2010-12-27 ENCOUNTER — Ambulatory Visit (INDEPENDENT_AMBULATORY_CARE_PROVIDER_SITE_OTHER): Payer: Medicare HMO | Admitting: Family Medicine

## 2010-12-27 DIAGNOSIS — M810 Age-related osteoporosis without current pathological fracture: Secondary | ICD-10-CM

## 2010-12-27 DIAGNOSIS — Z23 Encounter for immunization: Secondary | ICD-10-CM

## 2010-12-27 MED ORDER — IBANDRONATE SODIUM 3 MG/3ML IV SOLN
3.0000 mg | Freq: Once | INTRAVENOUS | Status: AC
  Administered 2010-12-27: 3 mg via INTRAVENOUS

## 2010-12-30 NOTE — Progress Notes (Signed)
  Subjective:    Patient ID: [name], female    DOB: 05/31/1929, 77 y.o.   MRN: [id number]  HPI  Here for IVP Boniva.  Discussed possibly stopping.  She will think about it.  I recommended a total of 5 years of treatment.  Review of Systems     Objective:   Physical Exam   IVP given by Theresia Lo RN     Assessment & Plan:  Osteoporosis:  IVP Boniva q 3 months for a total of 5 years of therapy recommended.  Normal renal function.

## 2011-03-12 ENCOUNTER — Encounter: Payer: Self-pay | Admitting: Family Medicine

## 2011-04-11 ENCOUNTER — Ambulatory Visit (INDEPENDENT_AMBULATORY_CARE_PROVIDER_SITE_OTHER): Payer: Medicare HMO | Admitting: Family Medicine

## 2011-04-11 ENCOUNTER — Encounter: Payer: Self-pay | Admitting: Family Medicine

## 2011-04-11 DIAGNOSIS — M810 Age-related osteoporosis without current pathological fracture: Secondary | ICD-10-CM

## 2011-04-11 DIAGNOSIS — M25562 Pain in left knee: Secondary | ICD-10-CM

## 2011-04-11 DIAGNOSIS — E785 Hyperlipidemia, unspecified: Secondary | ICD-10-CM

## 2011-04-11 DIAGNOSIS — Z0000 Encounter for general adult medical examination without abnormal findings: Secondary | ICD-10-CM

## 2011-04-11 DIAGNOSIS — R5383 Other fatigue: Secondary | ICD-10-CM

## 2011-04-11 DIAGNOSIS — M25569 Pain in unspecified knee: Secondary | ICD-10-CM

## 2011-04-11 DIAGNOSIS — I10 Essential (primary) hypertension: Secondary | ICD-10-CM

## 2011-04-11 DIAGNOSIS — R5381 Other malaise: Secondary | ICD-10-CM

## 2011-04-11 LAB — LIPID PANEL
HDL: 95 mg/dL (ref >39)
Triglycerides: 81 mg/dL (ref <150)

## 2011-04-11 LAB — COMPREHENSIVE METABOLIC PANEL
ALT: 20 U/L (ref 0–35)
BUN: 14 mg/dL (ref 6–23)
CO2: 27 mEq/L (ref 19–32)
Calcium: 9.8 mg/dL (ref 8.4–10.5)
Chloride: 100 mEq/L (ref 96–112)
Creat: 0.7 mg/dL (ref 0.50–1.10)
Glucose, Bld: 102 mg/dL — ABNORMAL HIGH (ref 70–99)

## 2011-04-11 LAB — CBC
HCT: 41.9 % (ref 36.0–46.0)
Hemoglobin: 13.5 g/dL (ref 12.0–15.0)
MCV: 95 fL (ref 78.0–100.0)
RBC: 4.41 MIL/uL (ref 3.87–5.11)
WBC: 6.9 10*3/uL (ref 4.0–10.5)

## 2011-04-11 LAB — TSH: TSH: 3.087 u[IU]/mL (ref 0.350–4.500)

## 2011-04-11 NOTE — Assessment & Plan Note (Signed)
Will obtain yearly bloodwork today- will also obtain cbc, tsh, and electrolytes to look for cause of fatigue.  Most likely due to sleep disruption.  Pt given handout on sleep hygiene.  Pt to review and will discuss in more detail at f/up appt.  Could consider starting pt on trazadone at bedtime if sleep problems do not resolve with sleep hygiene improvement.

## 2011-04-11 NOTE — Assessment & Plan Note (Signed)
Pt states that she doesn't want further routine screening:  DOES NOT WANT-- bone density, coloscopy, or mammography.  States that she does not want this at her age. -- May be good at next appt to discuss end of life wishes to have this documented in chart since pt has strong opinions about what she would like or not like done.

## 2011-04-11 NOTE — Patient Instructions (Signed)
Labs: I will send results to you in the mail  Fatigue: We will do labs, see handout on sleep hygiene.   Left knee: Tylenol 650mg  po 2 x day.  Monitor symptoms closely and return to see me in 1 month.    BP: Keep log book. Bring bp machine to next appt  Return in 1 month for follow up.

## 2011-04-11 NOTE — Assessment & Plan Note (Signed)
Most likely 2/2 arthritis pain.  Pt taking glucosamine as recommended by friend.  Will f/u on this issue in 1 month.  If pain is persistent can obtain plan film.

## 2011-04-11 NOTE — Assessment & Plan Note (Signed)
Obtain bmet today to recheck creatinine.  BP elevated at 164/74 today.  Pt to keep bp logbook and bring to next appt.  Pt to bring bp cuff/machine to next appt to check accuracy.

## 2011-04-11 NOTE — Assessment & Plan Note (Signed)
Lipid panel obtained today.  Pt to continue fish oil.

## 2011-04-11 NOTE — Progress Notes (Signed)
  Subjective:    Patient ID: [name], female    DOB: 1929-12-30, 78 y.o.   MRN: [id number]  HPI Patient here it was physical:  Fatigue: Patient endorses 2 years or longer of symptoms of fatigue. Worse x4-6 months. 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 Sleeps 2-4 hours per night. Does to bed at between 8:30 and 9:30--falls, sometimes does not fall asleep until middle of the night. Usually wakes up between 3 and 4 AM. Denies snoring. Denies symptoms of depression. States she does drink tea for it before bed as well as watches TV.  Pain medial left knee: Pain in left medial knee-worse with walking and taking steps. No trauma to area. Nothing seems to make it better, Except for rest. Not taking anything for pain. No joint redness. No skin changes.  Blood pressure: Patient states in good control at home on blood pressure machine. Did not bring blood pressure log book today. Blood pressure 164/74. States she is taking all medications.  Health maintenance: Patient states she does not want any screening test. Does not want mammography, colonoscopy, or further osteoporosis treatment or screening. States this was not important to her at her age.    Review of Systems As per above.    Objective:   Physical Exam  Constitutional: She is oriented to person, place, and time. She appears well-developed and well-nourished.  HENT:  Head: Normocephalic and atraumatic.  Right Ear: External ear normal.  Left Ear: External ear normal.       TM normal bilateral. Partial occulusion of view of right TM due to cerumen.  No tm erythema or fluid.   Cardiovascular: Normal rate.   Pulmonary/Chest: Effort normal. No respiratory distress.  Musculoskeletal:       Left knee exam: No crepitus. No popping.  Full rom.  Minimal tenderness on palpation of medial tibial plateau.  No joint redness.  No joint swelling.   Neurological: She is alert and oriented to person, place, and time.  Skin:  No rash noted.  Psychiatric: She has a normal mood and affect. Her behavior is normal.          Assessment & Plan:

## 2011-04-12 LAB — DIGOXIN LEVEL: Digoxin Level: 0.8 ng/mL (ref 0.8–2.0)

## 2011-04-21 ENCOUNTER — Other Ambulatory Visit: Payer: Self-pay | Admitting: Family Medicine

## 2011-04-21 ENCOUNTER — Ambulatory Visit (INDEPENDENT_AMBULATORY_CARE_PROVIDER_SITE_OTHER): Payer: Medicare HMO | Admitting: Family Medicine

## 2011-04-21 ENCOUNTER — Telehealth: Payer: Self-pay | Admitting: Family Medicine

## 2011-04-21 ENCOUNTER — Ambulatory Visit (HOSPITAL_COMMUNITY)
Admission: RE | Admit: 2011-04-21 | Discharge: 2011-04-21 | Disposition: A | Discharge status: Home / Self Care | Payer: Medicare HMO | Source: Ambulatory Visit | Attending: Family Medicine | Admitting: Family Medicine

## 2011-04-21 ENCOUNTER — Encounter: Payer: Self-pay | Admitting: Family Medicine

## 2011-04-21 VITALS — BP 122/73 | HR 86 | Ht 65.0 in | Wt 116.0 lb

## 2011-04-21 DIAGNOSIS — M25562 Pain in left knee: Secondary | ICD-10-CM

## 2011-04-21 DIAGNOSIS — R52 Pain, unspecified: Secondary | ICD-10-CM

## 2011-04-21 DIAGNOSIS — M1710 Unilateral primary osteoarthritis, unspecified knee: Secondary | ICD-10-CM | POA: Insufficient documentation

## 2011-04-21 DIAGNOSIS — IMO0002 Reserved for concepts with insufficient information to code with codable children: Secondary | ICD-10-CM | POA: Insufficient documentation

## 2011-04-21 DIAGNOSIS — M25569 Pain in unspecified knee: Secondary | ICD-10-CM | POA: Insufficient documentation

## 2011-04-21 NOTE — Patient Instructions (Signed)
Very nice to meet you dear I injected the knee and I'm glad it is feeling better. You can try Tylenol 650 mg 3 times a day for the pain. I will get an x-ray of your knee to see how much arthritis we have. Followup with Dr. Edmonia James on your regular appointment. If this pain is back by that time we need to consider sending you to her orthopedic physicians for Synvisc injections.  Wear and Tear Disorders of the Knee (Arthritis, Osteoarthritis) Everyone will experience wear and tear injuries (arthritis, osteoarthritis) of the knee. These are the changes we all get as we age. They come from the joint stress of daily living. The amount of cartilage damage in your knee and your symptoms determine if you need surgery. Mild problems require approximately two months recovery time. More severe problems take several months to recover. With mild problems, your surgeon may find worn and rough cartilage surfaces. With severe changes, your surgeon may find cartilage that has completely worn away and exposed the bone. Loose bodies of bone and cartilage, bone spurs (excess bone growth), and injuries to the menisci (cushions between the large bones of your leg) are also common. All of these problems can cause pain. For a mild wear and tear problem, rough cartilage may simply need to be shaved and smoothed. For more severe problems with areas of exposed bone, your surgeon may use an instrument for roughing up the bone surfaces to stimulate new cartilage growth. Loose bodies are usually removed. Torn menisci may be trimmed or repaired.

## 2011-04-21 NOTE — Assessment & Plan Note (Addendum)
Patient is a worsening symptoms. Differential includes most likely osteoarthritis with probably tricompartmental DJD. At this time we will get x-rays for her baseline. Differential also includes degenerative meniscal tear with patient having a decreased range of motion in flexion but states that only some minimal locking symptoms from time to time. Patient does not have any swelling of knee which makes me think is more chronic.  After obtaining consent, and per orders of Dr. Katrinka Blazing and Chambliss this, injection of 1 cc Kenalog-40 and 3 cc of 1% lidocaine after being prepped with Betadine given by [facility]. Patient instructed to remain in clinic for 20 minutes afterwards, and to report any adverse reaction to me immediately. Patient will return see her primary care provider in February. His pain is back at that time would consider referring her for potential for Synvisc injections. Patient would like to stay close so would refer her to Saundra Shelling and Dr. Margaretha Sheffield.

## 2011-04-21 NOTE — Progress Notes (Signed)
  Subjective:    Patient ID: [name], female    DOB: 1929/04/20, 78 y.o.   MRN: [id number]  HPI   Pain medial left knee: Patient was seen for this pain about one week ago with her primary care provider. Pain in left medial knee-worse with walking and taking steps. Patient also endorses that the pain seems to be worse if she sits for long amount of time. No trauma to area. Nothing seems to make it better. Not taking anything for pain and patient is very concerned due to the history of alcohol and prescription narcotic abuse. No joint redness. No skin changes. Patient has a mentally challenged child at home that she needs take care of and would like symptomatic relief. Denies that the knee has never given out on her does state some locking but no clicking that she can stay.  Review of Systems  As per above.    Objective:   Physical Exam  Constitutional: She is oriented to person, place, and time. She appears well-developed and well-nourished.  Cardiovascular: Normal rate.   Pulmonary/Chest: Effort normal. No respiratory distress.  Musculoskeletal:       Left knee exam: Knee: Normal to inspection with no erythema or effusion or obvious bony abnormalities patient does have trace edema distally to knee bilaterally. Palpation normal with no warmth + joint line tenderness or patellar tenderness or condyle tenderness. ROM mild decrease in flexion but full extension Ligaments with solid consistent endpoints including ACL, PCL, LCL, MCL. Positive McMurray's with medial rotation on meniscal tests. Non painful patellar compression. Patellar and quadriceps tendons unremarkable. Hamstring and quadriceps strength is normal.   Neurological: She is alert and oriented to person, place, and time.      Assessment & Plan:

## 2011-04-21 NOTE — Telephone Encounter (Signed)
[name] would like to have the exercise sheets mailed to her home.  They were not given to her when she left.

## 2011-04-22 ENCOUNTER — Telehealth: Payer: Self-pay | Admitting: Family Medicine

## 2011-04-22 NOTE — Telephone Encounter (Signed)
Printed out exercise to be sent in mail. After x-ray showing moderate DJD likely has meniscal tear gave rehab for that.  Called patient told her.

## 2011-04-22 NOTE — Telephone Encounter (Signed)
Printed out information to be mailed for knee rehab.

## 2011-04-24 ENCOUNTER — Encounter: Payer: Self-pay | Admitting: Family Medicine

## 2011-05-08 ENCOUNTER — Ambulatory Visit: Payer: Medicare HMO | Admitting: Family Medicine

## 2011-05-28 ENCOUNTER — Encounter: Payer: Self-pay | Admitting: Family Medicine

## 2011-05-28 ENCOUNTER — Ambulatory Visit (INDEPENDENT_AMBULATORY_CARE_PROVIDER_SITE_OTHER): Payer: Medicare HMO | Admitting: Family Medicine

## 2011-05-28 DIAGNOSIS — D239 Other benign neoplasm of skin, unspecified: Secondary | ICD-10-CM

## 2011-05-28 DIAGNOSIS — D229 Melanocytic nevi, unspecified: Secondary | ICD-10-CM

## 2011-05-28 DIAGNOSIS — I10 Essential (primary) hypertension: Secondary | ICD-10-CM

## 2011-05-28 DIAGNOSIS — M25569 Pain in unspecified knee: Secondary | ICD-10-CM

## 2011-05-28 DIAGNOSIS — M25562 Pain in left knee: Secondary | ICD-10-CM

## 2011-05-28 NOTE — Progress Notes (Signed)
  Subjective:    Patient ID: [name], female    DOB: 08/15/1929, 78 y.o.   MRN: [id number]  HPI Blood pressure followup: Patient states she has been checking blood pressures at home. They have consistently been in the 120s over 80s. Patient brought blood pressure log book for me to review. Patient states she is taking all medication as directed. She states she has been pleased with her blood pressure has been running. Blood pressure cuff that she uses at home was checked against our machine. Appears to be accurate.no dizziness. No syncope.  Left knee pain: Patient was seen for this approximately one month ago. States that her pain went from 10 out of 10 to occasional 1/10 pain after injection in left knee. Patient states that she wants to continue with this treatment plan since she has gotten so much relief. States that she is aware she can get this injection at the most every 3 months. No joint redness. No joint swelling. Improved range of motion.  Mole on right nasal bridge: Patient states that this mole has been getting bigger. Patient states that she would like to have it removed since it bothers her when she wears her glasses. She supposed wear her glasses all the time.--But she doesn't due to discomfort at this area. No bleeding from this area. No itching. No fever.  Review of Systems as per above    Objective:   Physical Exam  Constitutional: She appears well-developed and well-nourished.  HENT:  Head: Normocephalic and atraumatic.  Eyes: Pupils are equal, round, and reactive to light.  Cardiovascular: Normal rate, regular rhythm and normal heart sounds.   No murmur heard. Pulmonary/Chest: Effort normal. No respiratory distress. She has no wheezes.  Abdominal: Soft. She exhibits no distension.  Musculoskeletal: She exhibits no edema.       Knee exam bilateral: No pain with ROM,  Normal sensation. Normal strength. No pain with palpation of knees bilateral. No redness or  swelling of knees.  Neurological: She is alert.  Skin:       0.5 raised hyperpigmented area- stuck on appearance. Rough surface. - located right lateral nasal bridge area.  (in area where glasses rest on face)  Psychiatric: She has a normal mood and affect.          Assessment & Plan:

## 2011-05-28 NOTE — Patient Instructions (Signed)
Blood pressure- Beautiful!!!  Keep up the good work! Keep up the good work with your diet.  Knee pain- Return as needed--can give another injection in the knee at the 3 month mark.    Place on eye- I will send you to [facility] dermatology to get this removed since this is near the eye.   Return as needed.   Sign release of information form for Dr. Clarene Duke.

## 2011-06-08 DIAGNOSIS — D229 Melanocytic nevi, unspecified: Secondary | ICD-10-CM | POA: Insufficient documentation

## 2011-06-08 NOTE — Assessment & Plan Note (Signed)
Mole on right lateral nasal bridge area- very near corner of eye.  Appears to be a seborrheic keratosis.  Pt to go to dermatologist for removal since it is on face and in a difficult area.

## 2011-06-08 NOTE — Assessment & Plan Note (Signed)
Continue current regimen. Well controlled.

## 2011-06-08 NOTE — Assessment & Plan Note (Signed)
Great relief of pain with knee injection.  Return for repeat knee injections as needed.

## 2011-07-09 ENCOUNTER — Telehealth: Payer: Self-pay | Admitting: *Deleted

## 2011-07-09 NOTE — Telephone Encounter (Signed)
Yes, pt elected not to continue with boniva therapy. Pt is aware of DEXA scan results and recommendations. This was discussed at previous visit.

## 2011-07-09 NOTE — Telephone Encounter (Signed)
Patient requested  to be assigned to a female doctor  when reassignments are done this summer.  RN reviewed chart regarding Boniva therapy . Patient has not received since Sept 2012. Called patient to verify that she  no longer plans to receive and she states, yes,  that was choice she made.  She thinks she discussed this also with Dr. Edmonia James. Will forward message to Dr. Edmonia James  just to make sure she is aware.

## 2011-07-24 ENCOUNTER — Encounter: Payer: Self-pay | Admitting: Home Health Services

## 2011-08-14 ENCOUNTER — Ambulatory Visit: Payer: Medicare HMO | Admitting: Home Health Services

## 2011-08-20 ENCOUNTER — Ambulatory Visit (INDEPENDENT_AMBULATORY_CARE_PROVIDER_SITE_OTHER): Payer: Medicare HMO | Admitting: Home Health Services

## 2011-08-20 ENCOUNTER — Encounter: Payer: Self-pay | Admitting: Home Health Services

## 2011-08-20 VITALS — BP 114/64 | HR 78 | Temp 97.0°F | Ht 63.5 in | Wt 117.1 lb

## 2011-08-20 DIAGNOSIS — Z0000 Encounter for general adult medical examination without abnormal findings: Secondary | ICD-10-CM

## 2011-08-20 NOTE — Patient Instructions (Signed)
1. Continue to maintain your weight. 2. Continue to remain active out doors. 3. Focus on eating 3-5 fruits and vegetables a day.

## 2011-08-20 NOTE — Progress Notes (Signed)
Patient here for annual wellness visit, patient reports: Risk Factors/Conditions needing evaluation or treatment: Pt does not have any risk factors that need evaluation. Home Safety: Pt lives with son in 1 story home.  Pt reports having smoke detectors and adaptive equipment in bathroom. Other Information: Corrective lens: Pt wears daily corrective lens.  Pt reports visiting eye dr every 2 years. Dentures: Pt has full dentures. Memory: Pt denies memory problems. Patient's Mini Mental Score (recorded in doc. flowsheet): 29  Balance/Gait: osteoarthritis in right knee- causes some pain  Balance Abnormal Patient value  Sitting balance    Sit to stand    Attempts to arise    Immediate standing balance    Standing balance    Nudge    Eyes closed- Romberg    Tandem stance x unable  Back lean    Neck Rotation    360 degree turn    Sitting down     Gait Abnormal Patient value  Initiation of gait    Step length-left    Step length-right    Step height-left    Step height-right x   Step symmetry    Step continuity    Path deviation    Trunk movement    Walking stance        Annual Wellness Visit Requirements Recorded Today In  Medical, family, social history Past Medical, Family, Social History Section  Current providers Care team  Current medications Medications  Wt, BP, Ht, BMI Vital signs  Tobacco, alcohol, illicit drug use History  ADL Nurse Assessment  Depression Screening Nurse Assessment  Cognitive impairment Nurse Assessment  Mini Mental Status Document Flowsheet  Fall Risk Nurse Assessment  Home Safety Progress Note  End of Life Planning (welcome visit) Social Documentation  Medicare preventative services Progress Note  Risk factors/conditions needing evaluation/treatment Progress Note  Personalized health advice Patient Instructions, goals, letter  Diet & Exercise Social Documentation  Emergency Contact Social Documentation  Seat Belts Social Documentation    Sun exposure/protection Social Documentation    Prevention Plan:   Recommended Medicare Prevention Screenings Women over 65 Test For Frequency Date of Last- BOLD if needed  Breast Cancer 1-2 yrs 5/08- pt declined future screenigns  Cervical Cancer 1-3 yrs NI- due to age  Colorectal Cancer 1-10 yrs Pt declined screenings  Osteoporosis once 5/08  Cholesterol 5 yrs 1/13  Diabetes yearly 1/13  HIV yearly declined  Influenza Shot yearly 9/12  Pneumonia Shot once 3/99  Zostavax Shot once Pt declined   I have reviewed this visit and discussed with Arlys John and agree with her documentation.  Dawn S. Edmonia James, MD Family Medicine Residency Program PGY-3

## 2011-08-26 ENCOUNTER — Ambulatory Visit (INDEPENDENT_AMBULATORY_CARE_PROVIDER_SITE_OTHER): Payer: Medicare HMO | Admitting: Family Medicine

## 2011-08-26 ENCOUNTER — Encounter: Payer: Self-pay | Admitting: Family Medicine

## 2011-08-26 VITALS — BP 112/68 | HR 70 | Ht 65.0 in | Wt 115.0 lb

## 2011-08-26 DIAGNOSIS — M25562 Pain in left knee: Secondary | ICD-10-CM

## 2011-08-26 DIAGNOSIS — M25569 Pain in unspecified knee: Secondary | ICD-10-CM

## 2011-08-26 NOTE — Progress Notes (Signed)
  Subjective:    Patient ID: [name], female    DOB: May 17, 1929, 78 y.o.   MRN: [id number]  HPI Left knee arthritis: Patient states that she would like to have another injection in left knee. Had left knee injection in January 21 and did very well with this. States that the injection really helped her function in her daily life. Due to pain control. Now 5 months later feels like pain has come back and would like to have a repeat of injection. Patient is resistant to using Ultram for pain secondary to addiction history. No joint redness. No joint swelling. No fever.  Left knee x ray from 04/2011: Mild tricompartment degenerative changes most notable lateral  tibiofemoral joint space. Suggestion of mild chondrocalcinosis  changes.   Smoking status reviewed.   Review of Systems As per above.    Objective:   Physical Exam Constitutional: She is alert. She appears well-developed and well-nourished.  Cardiovascular: Normal rate.  Pulmonary/Chest: Effort normal. No respiratory distress.  Musculoskeletal:  Left knee exam: Knee: Normal to inspection with no erythema or effusion or obvious bony abnormalities patient does have trace edema distally to knee bilaterally. Palpation normal with no warmth, mild + joint line tenderness on medial tibial plataeu area of left knee. no patellar tenderness . ROM normal. Ligaments with solid consistent endpoints including ACL, PCL, LCL, MCL. Leg strength grossly normal bilateral. Neurological: She is alert          Assessment & Plan:

## 2011-08-28 NOTE — Assessment & Plan Note (Addendum)
Discuss risks and benefits of knee injection. After obtaining consent,  injection of 1 cc Kenalog-40 and 4 cc of 1% lidocaine after being prepped with Betadine- given by Edmonia James, MD- Dr. Jennette Kettle attending present. Patient instructed to remain in clinic for 20 minutes afterwards, and to report any adverse reaction to me immediately. Pt tolerated procedure well and no adverse reactions present.    Discussed again possible Synvisc injections- but patient states that she doesn't want these because they are "too new" on the market.  Would like to continue with current management.   Refuses rx of ultram- due to past addiction history- just doesn't "want to take a chance".  Instructed that she can safely use tylenol 650mg  po bid as needed for left knee pain that is not resolved by injection.  Pt to return as needed for this issue.

## 2011-09-04 ENCOUNTER — Ambulatory Visit: Payer: Medicare HMO | Admitting: Family Medicine

## 2011-11-18 HISTORY — PX: US ECHOCARDIOGRAPHY: HXRAD669

## 2011-11-25 ENCOUNTER — Telehealth: Payer: Self-pay | Admitting: Internal Medicine

## 2011-11-25 NOTE — Telephone Encounter (Signed)
Records Were Received From [facility] gave to [facility] 11/25/11/KM

## 2011-12-02 ENCOUNTER — Telehealth: Payer: Self-pay | Admitting: Internal Medicine

## 2011-12-02 NOTE — Telephone Encounter (Signed)
Former Dr. Clarene Andersen pt re-esting w/[facility].    FYI:  Pt had an issue last month and saw Dr. Clarene Andersen before his retirement, therefore she would like to push her sched 01/2012 appnt out until Jan. Or Feb 2014.  I have deleted the recall per pt request as she is addiment that she is not coming in until Jan or Feb 2014.

## 2011-12-12 ENCOUNTER — Ambulatory Visit (INDEPENDENT_AMBULATORY_CARE_PROVIDER_SITE_OTHER): Payer: Medicare HMO | Admitting: Family Medicine

## 2011-12-12 ENCOUNTER — Encounter: Payer: Self-pay | Admitting: Family Medicine

## 2011-12-12 VITALS — BP 142/86 | HR 93 | Temp 98.2°F | Ht 65.0 in | Wt 115.0 lb

## 2011-12-12 DIAGNOSIS — Z23 Encounter for immunization: Secondary | ICD-10-CM

## 2011-12-12 DIAGNOSIS — I5022 Chronic systolic (congestive) heart failure: Secondary | ICD-10-CM

## 2011-12-12 DIAGNOSIS — R6889 Other general symptoms and signs: Secondary | ICD-10-CM

## 2011-12-12 DIAGNOSIS — IMO0001 Reserved for inherently not codable concepts without codable children: Secondary | ICD-10-CM

## 2011-12-12 DIAGNOSIS — N390 Urinary tract infection, site not specified: Secondary | ICD-10-CM | POA: Insufficient documentation

## 2011-12-12 DIAGNOSIS — I10 Essential (primary) hypertension: Secondary | ICD-10-CM

## 2011-12-12 DIAGNOSIS — R300 Dysuria: Secondary | ICD-10-CM

## 2011-12-12 DIAGNOSIS — R350 Frequency of micturition: Secondary | ICD-10-CM

## 2011-12-12 LAB — POCT URINALYSIS DIPSTICK
Bilirubin, UA: NEGATIVE
Blood, UA: NEGATIVE
Ketones, UA: NEGATIVE
Spec Grav, UA: 1.02
pH, UA: 6.5

## 2011-12-12 NOTE — Assessment & Plan Note (Addendum)
BP slightly elevated today in office (142/96), but pt reports better readings at home, which frequently are actually low. Her meds have always been prescribed by Dr. Clarene Duke (cardiology) in the past and she is not requesting any refills today. I do not want to increase any of her medications given probable intermittent hypotension at home. Takes carvedilol 6.25mg  BID, digoxin 125 mcg QD, lasix 40mg  QD prn, and losartan 50mg  QD.

## 2011-12-12 NOTE — Assessment & Plan Note (Addendum)
Pt complains of increasing shortness of breath over the last few weeks to months. I do not hear any crackles or abnormal lung sounds today to suggest decompensated heart failure. Pt will try OTC H2 blocker to see if phlegm & throat clearing improve. Instructed pt to return if symptoms persist, but if she continues to have shortness of breath to make an appointment with her cardiologist as I would most likely refer her to them. She is amenable to this plan.   I did fill out the paperwork today for her to get a permanent handicap placard.

## 2011-12-12 NOTE — Assessment & Plan Note (Addendum)
UA in office is WNL today. Had planned to culture urine anyways given recent antibiotic course and continued symptoms, but unfortunately I did not put in the order in time before the urine was discarded. I have contacted Dr. Jennette Kettle to discuss best course of action regarding culture, will likely call [name] and offer for her to come in for a lab visit to give another sample so we can culture that. I do not have a high suspicion that culture will grow anything given clear UA. [name] instructed to return if her left sided lower back & pelvic pain do not improve.

## 2011-12-12 NOTE — Progress Notes (Signed)
Patient ID: [name], female   DOB: 06/01/1929, 78 y.o.   MRN: [id number]  HPI: Pt is a 78 y.o. female here today to follow up on her hypertension and to discuss urinary symptoms.  Three weeks ago she went to a non-[facility] affiliated urgent care and was diagnosed with a UTI and given a seven day course of an antibiotic that starts with "c" (she is unable to remember the entire name). Her symptoms had included left lower back & pelvic pain, with dysuria and foul odor from her urine. She thought her symptoms improved initially with the antibiotic, but then returned to how they were before. Her low back/pelvic pain on the left side is intermittent, not constant. She reports a history of chronic urinary discomfort and infections since she was 78 years old.  She also mentions feeling fatigued over the last few months, and short of breath. She has a history of congestive heart failure, is s/p pacemaker and defibrillator placement, and has been followed by Dr. Clarene Duke of cardiology, who is retiring. She plans to see Dr. Graciela Husbands for her pacemaker/defibrillator management. She thinks her breathing has become more shallow. She denies chest pain but says she has been having to clear her throat of phlegm more often lately. She requests a handicap placard as she has trouble walking long distances because her "knees give out" and thinks it would be helpful to park closer to stores.  BP today is 142/96, but she reports that she checks it at home and it is frequently below 100 systolic and that her numbers rarely get over the 120s systolic.  She would like her flu shot today.  ROS: per HPI  Exam: Gen: NAD, pleasant, cooperative Heart: RRR, no murmurs auscultated Lungs: CTAB. No crackles or wheezing. Normal respiratory effort. Does clear throat throughout visit but voice otherwise normal. Abd: nontender, nondistended Back: nontender to palpation in left lower back, no CVA tenderness bilaterally Ext: 2+ edema in  bilateral lower extremities.

## 2011-12-12 NOTE — Assessment & Plan Note (Signed)
Suspect this is reflux related. Will recommend Zantac over the counter to see if it improves. Pt amenable to this plan.

## 2011-12-12 NOTE — Patient Instructions (Addendum)
You can try Zantac over the counter. You should be able to find this at [facility]. Just follow the directions on the box.  If the pain in your back doesn't get better, come back to see me here in the office.  If your shortness of breath gets worse or doesn't get better, go see your cardiologist.

## 2011-12-15 ENCOUNTER — Telehealth: Payer: Self-pay | Admitting: Family Medicine

## 2011-12-15 NOTE — Telephone Encounter (Signed)
Called patient at home.  She reports Zantac is helping her throat feel much better. 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 Urine is also feeling better. Still has pain in her hip and down her leg, but she took arthritis strength ibuprofen and it helped her hip. She thinks the hip pain might be some arthritis. Feels like she has more energy than she did on Friday.  Explained to her the mishap with not getting her urine culture, and she appreciated the call. She will think about coming in for a lab appointment to give another urine sample, and will call if she decides to do this. If she comes back in, we will culture her urine.

## 2011-12-30 ENCOUNTER — Ambulatory Visit (INDEPENDENT_AMBULATORY_CARE_PROVIDER_SITE_OTHER): Payer: Medicare HMO | Admitting: Family Medicine

## 2011-12-30 ENCOUNTER — Encounter: Payer: Self-pay | Admitting: Family Medicine

## 2011-12-30 VITALS — BP 150/77 | HR 85 | Temp 98.1°F | Ht 65.0 in | Wt 115.4 lb

## 2011-12-30 DIAGNOSIS — R300 Dysuria: Secondary | ICD-10-CM

## 2011-12-30 DIAGNOSIS — N390 Urinary tract infection, site not specified: Secondary | ICD-10-CM

## 2011-12-30 LAB — POCT URINALYSIS DIPSTICK
Bilirubin, UA: NEGATIVE
Glucose, UA: NEGATIVE
Spec Grav, UA: 1.025

## 2011-12-30 LAB — POCT UA - MICROSCOPIC ONLY

## 2011-12-30 MED ORDER — CEPHALEXIN 500 MG PO CAPS: 500.0000 mg | ORAL_CAPSULE | Freq: Two times a day (BID) | ORAL | Status: DC

## 2011-12-30 NOTE — Progress Notes (Signed)
  Subjective:    Patient ID: [name], female    DOB: 1930/01/28, 78 y.o.   MRN: [id number]  HPI history of frequent lower tract UTI since teenager. Last treated at an urgent care a month ago. Now 2 days of urgency, suprapubic pressure, burning and straining to urinate. Nocturia x 2 is chronic. No back pain, fever, or chills. Chronic stress and urge incontinence for which she wears a pad. history of bladder suspension. No OTC anticholinergic meds.    Review of Systems     Objective:   Physical Exam  Cardiovascular: Normal rate and regular rhythm.   Pulmonary/Chest: Effort normal and breath sounds normal.       Mildly tender right CVA area  Abdominal: Soft. She exhibits no distension and no mass. There is tenderness. There is no rebound and no guarding.       Suprapubic tenderness          Assessment & Plan:

## 2011-12-30 NOTE — Assessment & Plan Note (Signed)
Cystitis. No indications or putative cause of retention. Will treat with Keflex, but culture due to recent antibiotic treatment

## 2011-12-30 NOTE — Patient Instructions (Addendum)
Please drink a lot of fluids while you are taking the antibiotic  We'll contact you if the antibiotic needs to be changed  Call if you develop fever, vomiting or back pain

## 2012-01-02 LAB — URINE CULTURE: Colony Count: >=100000

## 2012-01-05 ENCOUNTER — Telehealth: Payer: Self-pay | Admitting: Family Medicine

## 2012-01-05 MED ORDER — CIPROFLOXACIN HCL 500 MG PO TABS: 500.0000 mg | ORAL_TABLET | Freq: Two times a day (BID) | ORAL | Status: DC

## 2012-01-05 NOTE — Telephone Encounter (Signed)
Called earlier today to give urine culture results and no answer. Recalled and answerer said she just left to carry a family member to the ER. I told him that I will call her later today.

## 2012-01-05 NOTE — Telephone Encounter (Signed)
I spoke with [name] whose suprapubic pressure is improved, but still has some dysuria. Since the Citrobacter wasn't sensitive to Cephalexin, I will have her take Cipro for 7 more days.

## 2012-03-04 ENCOUNTER — Encounter: Payer: Self-pay | Admitting: Family Medicine

## 2012-03-16 ENCOUNTER — Ambulatory Visit: Payer: Medicare HMO | Admitting: Internal Medicine

## 2012-03-18 ENCOUNTER — Other Ambulatory Visit: Payer: Self-pay

## 2012-03-18 MED ORDER — DIGOXIN 125 MCG PO TABS: 125.0000 ug | ORAL_TABLET | Freq: Every day | ORAL | Status: DC

## 2012-03-18 MED ORDER — CARVEDILOL 6.25 MG PO TABS: 6.2500 mg | ORAL_TABLET | Freq: Two times a day (BID) | ORAL | Status: DC

## 2012-04-05 ENCOUNTER — Encounter: Payer: Self-pay | Admitting: Family Medicine

## 2012-04-05 ENCOUNTER — Ambulatory Visit (INDEPENDENT_AMBULATORY_CARE_PROVIDER_SITE_OTHER): Payer: Medicare HMO | Admitting: Family Medicine

## 2012-04-05 VITALS — BP 124/75 | HR 87 | Temp 98.5°F | Ht 65.0 in | Wt 114.0 lb

## 2012-04-05 DIAGNOSIS — J189 Pneumonia, unspecified organism: Secondary | ICD-10-CM | POA: Insufficient documentation

## 2012-04-05 MED ORDER — BENZONATATE 200 MG PO CAPS: 200.0000 mg | ORAL_CAPSULE | Freq: Two times a day (BID) | ORAL | Status: DC | PRN

## 2012-04-05 MED ORDER — DOXYCYCLINE HYCLATE 100 MG PO TABS: 100.0000 mg | ORAL_TABLET | Freq: Two times a day (BID) | ORAL | Status: AC

## 2012-04-05 NOTE — Assessment & Plan Note (Signed)
A: viral respiratory infection vs community acquired pneumonia. No evidence of decompensated heart failure.  P:  1. doxycycline 100 mg twice daily for 7 days 2. Tessalon for cough 3. Vitamin C for antiviral properties.  4. Tylenol for pain: you may take an additional two tabs (1000 mg) for pain midday. No more than 3000 mg total.   F/u in 4 days if not better f/u sooner if worse.

## 2012-04-05 NOTE — Progress Notes (Signed)
Subjective:     Patient ID: [name], female   DOB: 01/28/1930, 79 y.o.   MRN: [id number]  HPI 79 yo F with a history of heart failure presents for a same day visit with her daughter for 6 days of cough, congestion and shortness of breath. She has no known sick contacts. She denies fever. She admits to CP with coughing. She takes tylenol for the pain. She has not tried an OTC antitussive.    Review of Systems As per HPI    Objective:   Physical Exam BP 124/75  Pulse 87  Temp 98.5 F (36.9 C) (Oral)  Ht 5\' 5"  (1.651 m)  Wt 114 lb (51.71 kg)  BMI 18.97 kg/m2  SpO2 95% Wt Readings from Last 3 Encounters:  04/05/12 114 lb (51.71 kg)  12/30/11 115 lb 6.4 oz (52.345 kg)  12/12/11 115 lb (52.164 kg)   General appearance: alert, cooperative and appears stated age, intermittent cough spasms  Back: thoracic scoliosis Lungs: clear to auscultation bilaterally Heart: regular rate and rhythm, S1, S2 normal, no murmur, click, rub or gallop Extremities: extremities normal, atraumatic, no cyanosis or edema Pulses: 2+ and symmetric Skin: Skin color, texture, turgor normal. No rashes or lesions Neurologic: Grossly normal     Assessment and Plan:

## 2012-04-05 NOTE — Patient Instructions (Addendum)
Ms. Larabee,  Thank you for coming in today.  I believe you have a viral respiratory infection vs community acquired pneumonia.   1. doxycycline 100 mg twice daily for 7 days 2. Tessalon for cough 3. Vitamin C for antiviral properties.  4. Tylenol for pain: you may take an additional two tabs (1000 mg) for pain midday. No more than 3000 mg total.   F/u in 4 days if not better f/u sooner if worse.   Dr. Armen Pickup

## 2012-05-07 ENCOUNTER — Ambulatory Visit: Payer: Medicare HMO | Admitting: Internal Medicine

## 2012-05-07 ENCOUNTER — Encounter: Payer: Medicare HMO | Admitting: Internal Medicine

## 2012-05-12 ENCOUNTER — Encounter: Payer: Medicare HMO | Admitting: Internal Medicine

## 2012-06-15 ENCOUNTER — Other Ambulatory Visit: Payer: Self-pay | Admitting: Internal Medicine

## 2012-07-31 ENCOUNTER — Other Ambulatory Visit: Payer: Self-pay

## 2012-07-31 LAB — ICD DEVICE OBSERVATION

## 2012-09-07 ENCOUNTER — Encounter: Payer: Medicare HMO | Admitting: Internal Medicine

## 2012-09-09 ENCOUNTER — Encounter: Payer: Self-pay | Admitting: Internal Medicine

## 2012-09-09 ENCOUNTER — Ambulatory Visit (INDEPENDENT_AMBULATORY_CARE_PROVIDER_SITE_OTHER): Payer: Medicare HMO | Admitting: Internal Medicine

## 2012-09-09 VITALS — BP 142/70 | HR 97 | Ht 65.0 in | Wt 114.8 lb

## 2012-09-09 DIAGNOSIS — Z95810 Presence of automatic (implantable) cardiac defibrillator: Secondary | ICD-10-CM

## 2012-09-09 DIAGNOSIS — T82198A Other mechanical complication of other cardiac electronic device, initial encounter: Secondary | ICD-10-CM

## 2012-09-09 DIAGNOSIS — R000 Tachycardia, unspecified: Secondary | ICD-10-CM

## 2012-09-09 DIAGNOSIS — I5022 Chronic systolic (congestive) heart failure: Secondary | ICD-10-CM

## 2012-09-09 DIAGNOSIS — I428 Other cardiomyopathies: Secondary | ICD-10-CM

## 2012-09-09 DIAGNOSIS — I498 Other specified cardiac arrhythmias: Secondary | ICD-10-CM

## 2012-09-09 DIAGNOSIS — I10 Essential (primary) hypertension: Secondary | ICD-10-CM

## 2012-09-09 HISTORY — DX: Other mechanical complication of other cardiac electronic device, initial encounter: T82.198A

## 2012-09-09 MED ORDER — FUROSEMIDE 40 MG PO TABS: ORAL_TABLET | ORAL | Status: DC

## 2012-09-09 MED ORDER — CARVEDILOL 12.5 MG PO TABS: 12.5000 mg | ORAL_TABLET | Freq: Two times a day (BID) | ORAL | Status: DC

## 2012-09-09 MED ORDER — LOSARTAN POTASSIUM 50 MG PO TABS: 50.0000 mg | ORAL_TABLET | Freq: Every day | ORAL | Status: DC

## 2012-09-09 NOTE — Assessment & Plan Note (Signed)
The issue is whether this is primary or secondary. We'll check CBC and TSH. We'll also increase her beta blocker and reassess. Getting her heart rate down is a important goal of therapy

## 2012-09-09 NOTE — Progress Notes (Signed)
ELECTROPHYSIOLOGY CONSULT NOTE  Patient ID: [name], MRN: [id number], DOB/AGE: [birth year] 78 y.o. Admit date: (Not on file) Date of Consult: 09/09/2012  Primary Physician: Regina Feinstein, MD Primary Cardiologist: new from [facility]  Chief Complaint: est ICD followup   HPI [name] is a 79 y.o. female   Seen to establish an ICD care. This was implanted in 2007 at [facility]. She had been referred there by [name] after she presented in 2006 with congestive heart failure. Catheterization at that time demonstrated no obstructive disease. She then had a falling out with Dr. Cloyde Andersen. And Dr. Clarene Andersen assumed her care until she left. She underwent generator replacement by Dr. Erlinda Andersen in 2012.  She comes in today with complaints of fatigue and chronic edema. She is sleeping flat in bed. She does not have nocturnal dyspnea. She's had no palpitations. There've been no ICD discharges.  She is rather emphatic as she discusses the need for forth rightness in truth telling from her physician's. She recounts that she did alcohol addiction counseling at [name] with many physicians      Past Medical History  Diagnosis Date  . Nonischemic cardiomyopathy     Cath without coronary disease 2006  . Systolic CHF   . Hyperlipidemia   . Hypertension   . Osteoporosis   . Urge incontinence   . Raynaud phenomenon   . PAF (paroxysmal atrial fibrillation)     Short bursts of PAF. Asymptomatic.  Sees Dr [name].  . Biventricular ICD (implantable cardioverter-defibrillator)-[employer]     Date of implant 2007 generator change 2012  . Sinus tachycardia       Surgical History:  Past Surgical History  Procedure Laterality Date  . Appendectomy    . Biventricular icd implant  06/28/2010    St Jude Implantable Defibrillator. Model No [id number]. Serial no [id number].  Placed by [facility]  . Bladder tack    . Cardiac catheterization  03/2005    EF 25%, Normal arteries,  Nonischemic CM  . Abdominal hysterectomy  1970s  . Suprapubic urethrovesicular suspension  1992     Home Meds: Prior to Admission medications   Medication Sig Start Date End Date Taking? Authorizing Provider  acetaminophen (TYLENOL) 650 MG suppository Take 650 mg by mouth 2 (two) times daily.   Yes Historical Provider, MD  aspirin 81 MG EC tablet Take 81 mg by mouth once.    Yes Historical Provider, MD  carvedilol (COREG) 6.25 MG tablet TAKE ONE TABLET BY MOUTH TWICE DAILY 06/15/12  Yes Andersen Salvia, MD  Cholecalciferol (VITAMIN D-3) 1000 UNITS CAPS Take 1 capsule by mouth daily.   Yes Historical Provider, MD  Cranberry (SM CRANBERRY) 300 MG tablet Take 300 mg by mouth 3 (three) times daily.   Yes Historical Provider, MD  digoxin (LANOXIN) 0.125 MG tablet TAKE ONE TABLET BY MOUTH EVERY DAY 06/15/12  Yes Andersen Salvia, MD  docusate sodium (COLACE) 100 MG capsule Take 200 mg by mouth once.   Yes Historical Provider, MD  fish oil-omega-3 fatty acids 1000 MG capsule Take 3 g by mouth daily.    Yes Historical Provider, MD  furosemide (LASIX) 40 MG tablet Take 1 tablet by mouth once a day as needed breathlessness or swelling    Yes Historical Provider, MD     Allergies  Allergen Reactions  . Sulfamethoxazole     REACTION: unspecified    History   Social History  . Marital Status: Widowed  Spouse Name: N/A    Number of Children: 4  . Years of Education: masters   Occupational History  . Retired- Management consultant   .     Social History Main Topics  . Smoking status: Former Smoker    Quit date: 04/22/2002  . Smokeless tobacco: Never Used  . 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  . Drug Use: No  . Sexually Active: Not on file   Other Topics Concern  . Not on file   Social History Narrative   Health Care POA: [name] (h) [phone] (c) [phone]   [name] has copy of Living Will.   Emergency Contact: [name]   End of  Life Plan: DNR/DNI   Who lives with you: Lives with son who is handicapped   Any pets: bird, [place]a   Diet: Patient has a varied diet and does not eat fish or a lot of meat.   Exercise:  gardens, and cleans home   Seatbelts: Patient reports wearing seat belt when in vehicle.   Sun Exposure/Protection: Patient denies wearing sun protection    Hobbies: Reading, shopping, gardening, visiting family      Widowed 4 children- son [name] lives with her (mild MR).       Retired, formerly was Programme researcher, broadcasting/film/video.  Recovered alcoholic,  Quit 1979. Quit Smoking 2004.     Family History  Problem Relation Age of Onset  . Heart disease Mother   . Heart disease Father   . Cancer Father     Kidney  . Heart disease Brother   . Diabetes Son      ROS:  Please see the history of present illness.   Negative except Left knee arthritis  All other systems reviewed and negative.    Physical Exam:   Blood pressure 142/70, pulse 97, height 5\' 5"  (1.651 m), weight 114 lb 12.8 oz (52.073 kg). General: Well developed, very thin younger than age appearing Caucasian  female in no acute distress. Head: Normocephalic, atraumatic, sclera non-icteric, no xanthomas, nares are without discharge. EENT: normal Lymph Nodes:  none Back: with kyphosis , no CVA tendersness Neck: Negative for carotid bruits. JVD not elevated. Lungs: Clear bilaterally to auscultation without wheezes, rales, or rhonchi. Breathing is unlabored. Heart: RRR with S1 S2. No murmur , rubs, or gallops appreciated. Abdomen: Soft, non-tender, non-distended with normoactive bowel sounds. No hepatomegaly. No rebound/guarding. No obvious abdominal masses. Msk:  Strength and tone appear normal for age. Extremities: No clubbing or cyanosis. 2+ nonpitting edema with venous varicosities  Distal pedal pulses are 2+ and equal bilaterally. Skin: Warm and Dry Neuro: Alert and oriented X 3. CN III-XII intact Grossly normal sensory and motor function  . Psych:  Responds to questions appropriately with a normal affect.      Labs: Cardiac Enzymes No results found for this basename: CKTOTAL, CKMB, TROPONINI,  in the last 72 hours CBC Lab Results  Component Value Date   WBC 6.9 04/11/2011   HGB 13.5 04/11/2011   HCT 41.9 04/11/2011   MCV 95.0 04/11/2011   PLT 154 04/11/2011   PROTIME: No results found for this basename: LABPROT, INR,  in the last 72 hours Chemistry No results found for this basename: NA, K, CL, CO2, BUN, CREATININE, CALCIUM, LABALBU, PROT, BILITOT, ALKPHOS, ALT, AST, GLUCOSE,  in the last 168 hours Lipids Lab Results  Component Value Date   CHOL 218* 04/11/2011   HDL 95 04/11/2011  LDLCALC 107* 04/11/2011   TRIG 81 04/11/2011   BNP Pro B Natriuretic peptide (BNP)  Date/Time Value Range Status  09/28/2008  8:00 PM 128.0* 0.0 - 100.0 pg/mL Final   Miscellaneous Lab Results  Component Value Date   DDIMER  Value: 0.34        AT THE INHOUSE ESTABLISHED CUTOFF VALUE OF 0.48 ug/mL FEU, THIS ASSAY HAS BEEN DOCUMENTED IN THE LITERATURE TO HAVE A SENSITIVITY AND NEGATIVE PREDICTIVE VALUE OF AT LEAST 98 TO 99%.  THE TEST RESULT SHOULD BE CORRELATED WITH AN ASSESSMENT OF THE CLINICAL PROBABILITY OF DVT / VTE. 09/28/2008    Radiology/Studies:  No results found.  EKG: sinus rhythm with a synchronous pacing with negative QRS morphologies across the entire precordium  Chest x-ray were reviewed. There is macrodislodgment of the left ventricular lead noted in 2012  Assessment and Plan:   Sherryl Manges

## 2012-09-09 NOTE — Assessment & Plan Note (Signed)
The patient's device was interrogated and the information was fully reviewed.  The device was reprogrammed to inactivate LV pacing

## 2012-09-09 NOTE — Assessment & Plan Note (Addendum)
The patient has nonischemic cardiac myopathy with class 2-3 heart failure. She has significant sinus tachycardia which we might be able to address by uptitrating  her beta blockers. Last TSH and CBC were January 2013 and they were normal.  We will try initially with carvedilol; her blood pressure is plenty of room. If this does not work we will add low-dose metoprolol succinate.  She might also be a candidate for Aldactone.  Part of our visit today however was to discuss end-of-life issues. She said that she is not afraid of dying but she is afraid of living until she dies. We asked for some clarification on that. Case we discussed the role of an ICD and its utility as such which can change her mode of death from sudden perhaps to non-sudden.  We also note that her left ventricular lead is dislodged. Her functional status to her right now is quite acceptable. Given that, we will simply inactivate the LV lead to preserve battery.

## 2012-09-09 NOTE — Patient Instructions (Addendum)
Your physician has recommended you make the following change in your medication:  1) Increase coreg (carvedilol) 12.5 mg one tablet by mouth twice daily.  Your physician recommends that you return for lab work : the day of your echo: TSH/ CBC  Your physician has requested that you have an echocardiogram. Echocardiography is a painless test that uses sound waves to create images of your heart. It provides your doctor with information about the size and shape of your heart and how well your heart's chambers and valves are working. This procedure takes approximately one hour. There are no restrictions for this procedure.  Your physician has recommended that you have a cardiopulmonary stress test (CPX). CPX testing is a non-invasive measurement of heart and lung function. It replaces a traditional treadmill stress test. This type of test provides a tremendous amount of information that relates not only to your present condition but also for future outcomes. This test combines measurements of you ventilation, respiratory gas exchange in the lungs, electrocardiogram (EKG), blood pressure and physical response before, during, and following an exercise protocol.  Your physician recommends that you schedule a follow-up appointment in: 4-6 weeks with Dr. Graciela Husbands.  Call next week and let Dr. Graciela Husbands know how you are feeling after the changes made in your device today.

## 2012-09-09 NOTE — Assessment & Plan Note (Signed)
aa As above  

## 2012-09-09 NOTE — Assessment & Plan Note (Signed)
As above.

## 2012-09-10 LAB — ICD DEVICE OBSERVATION
AL AMPLITUDE: 3.8 mv
AL IMPEDENCE ICD: 337.5 Ohm
AL THRESHOLD: 0.75 V
ATRIAL PACING ICD: 0.15 pct
BAMS-0001: 180 {beats}/min
FVT: 0
LV LEAD IMPEDENCE ICD: 287.5 Ohm
LV LEAD THRESHOLD: 0.75 V
MODE SWITCH EPISODES: 2
RV LEAD AMPLITUDE: 11.4 mv
RV LEAD THRESHOLD: 0.75 V
TOT-0008: 0
VENTRICULAR PACING ICD: 96 pct

## 2012-09-15 ENCOUNTER — Telehealth: Payer: Self-pay | Admitting: *Deleted

## 2012-09-15 ENCOUNTER — Ambulatory Visit (HOSPITAL_COMMUNITY): Payer: Medicare HMO | Attending: Cardiology

## 2012-09-15 ENCOUNTER — Other Ambulatory Visit (INDEPENDENT_AMBULATORY_CARE_PROVIDER_SITE_OTHER): Payer: Medicare HMO

## 2012-09-15 DIAGNOSIS — I10 Essential (primary) hypertension: Secondary | ICD-10-CM | POA: Insufficient documentation

## 2012-09-15 DIAGNOSIS — I5022 Chronic systolic (congestive) heart failure: Secondary | ICD-10-CM

## 2012-09-15 DIAGNOSIS — I509 Heart failure, unspecified: Secondary | ICD-10-CM | POA: Insufficient documentation

## 2012-09-15 DIAGNOSIS — E785 Hyperlipidemia, unspecified: Secondary | ICD-10-CM | POA: Insufficient documentation

## 2012-09-15 DIAGNOSIS — I498 Other specified cardiac arrhythmias: Secondary | ICD-10-CM

## 2012-09-15 DIAGNOSIS — R000 Tachycardia, unspecified: Secondary | ICD-10-CM

## 2012-09-15 DIAGNOSIS — Z87891 Personal history of nicotine dependence: Secondary | ICD-10-CM | POA: Insufficient documentation

## 2012-09-15 DIAGNOSIS — I428 Other cardiomyopathies: Secondary | ICD-10-CM

## 2012-09-15 LAB — CBC WITH DIFFERENTIAL/PLATELET
Basophils Absolute: 0 10*3/uL (ref 0.0–0.1)
Eosinophils Absolute: 0.2 10*3/uL (ref 0.0–0.7)
Lymphocytes Relative: 20.9 % (ref 12.0–46.0)
MCHC: 33 g/dL (ref 30.0–36.0)
MCV: 97.4 fl (ref 78.0–100.0)
Monocytes Absolute: 0.8 10*3/uL (ref 0.1–1.0)
Neutrophils Relative %: 67 % (ref 43.0–77.0)
Platelets: 154 10*3/uL (ref 150.0–400.0)
RBC: 4.32 Mil/uL (ref 3.87–5.11)
RDW: 14.5 % (ref 11.5–14.6)

## 2012-09-15 LAB — TSH: TSH: 1.59 u[IU]/mL (ref 0.35–5.50)

## 2012-09-15 NOTE — Progress Notes (Signed)
Echocardiogram performed.  

## 2012-09-15 NOTE — Telephone Encounter (Signed)
Pt comes in today for lab work and her ECHO.  States she was told to call back and let Dr. Graciela Husbands know how she was doing on the increased Carvedilol  I talked with pt and she states her blood pressure has been running as low as 90/40 since increase of Carvedilol. Also states that she has 2 bp cuffs --- one registers low and the other registers normal.  States her fatigue is no worse with the increased dose of Carvedilol.  According to pt she is staying well hydrated. No dizziness or lightheadedness.  I checked her blood pressure  118/64  HR 74.    She took her Carvedilol around 6 am this morning.  She will keep a record of her blood pressure with the better reading cuff she has at home. I told her if there were any changes to her medications we would call her.  Pt agrees with this plan.  Mylo Red RN

## 2012-09-16 ENCOUNTER — Other Ambulatory Visit: Payer: Self-pay | Admitting: *Deleted

## 2012-09-29 ENCOUNTER — Ambulatory Visit (HOSPITAL_COMMUNITY): Payer: Medicare HMO | Attending: Internal Medicine

## 2012-09-29 DIAGNOSIS — I5022 Chronic systolic (congestive) heart failure: Secondary | ICD-10-CM

## 2012-10-04 ENCOUNTER — Telehealth: Payer: Self-pay | Admitting: Internal Medicine

## 2012-10-04 NOTE — Telephone Encounter (Signed)
New problem  Pt states that her BP is dropping extremely low to the point that she has no energy when she takes the COREG 12.5 MG.

## 2012-10-04 NOTE — Telephone Encounter (Signed)
Spoke with pt, since the increase to 12.5 mg of the carvedilol she has no energy. This happened once before on this med when it was increased. Okay given for the pt to take 1/2 tablet in the morning and whole tablet in the evening. She will call back if she cont to have symptoms after decreasing.

## 2012-10-12 ENCOUNTER — Ambulatory Visit (INDEPENDENT_AMBULATORY_CARE_PROVIDER_SITE_OTHER): Payer: Medicare HMO | Admitting: Internal Medicine

## 2012-10-12 ENCOUNTER — Other Ambulatory Visit: Payer: Self-pay

## 2012-10-12 ENCOUNTER — Encounter: Payer: Self-pay | Admitting: Internal Medicine

## 2012-10-12 VITALS — BP 159/66 | HR 77 | Ht 62.0 in | Wt 114.8 lb

## 2012-10-12 DIAGNOSIS — I428 Other cardiomyopathies: Secondary | ICD-10-CM

## 2012-10-12 DIAGNOSIS — I509 Heart failure, unspecified: Secondary | ICD-10-CM

## 2012-10-12 DIAGNOSIS — I5022 Chronic systolic (congestive) heart failure: Secondary | ICD-10-CM

## 2012-10-12 LAB — ICD DEVICE OBSERVATION
AL IMPEDENCE ICD: 312.5 Ohm
AL THRESHOLD: 0.75 V
ATRIAL PACING ICD: 0.56 pct
BAMS-0003: 70 {beats}/min
DEVICE MODEL ICD: 634451
FVT: 0
HV IMPEDENCE: 44 Ohm
PACEART VT: 0
RV LEAD AMPLITUDE: 6.2 mv
RV LEAD IMPEDENCE ICD: 400 Ohm
TOT-0008: 0
TOT-0009: 0
TOT-0010: 7

## 2012-10-12 MED ORDER — CARVEDILOL 3.125 MG PO TABS: 3.1250 mg | ORAL_TABLET | Freq: Two times a day (BID) | ORAL | Status: DC

## 2012-10-12 MED ORDER — SPIRONOLACTONE 25 MG PO TABS: ORAL_TABLET | ORAL | Status: DC

## 2012-10-12 MED ORDER — LOSARTAN POTASSIUM 25 MG PO TABS: ORAL_TABLET | ORAL | Status: DC

## 2012-10-12 NOTE — Assessment & Plan Note (Signed)
As above.

## 2012-10-12 NOTE — Assessment & Plan Note (Signed)
The patient's device was interrogated.  The information was reviewed. No changes were made in the programming.    

## 2012-10-12 NOTE — Progress Notes (Signed)
Patient Care Team: Latrelle Dodrill, MD as PCP - General (Pediatrics) Judene Companion, MD (Cardiology) Dorothyann Gibbs, MD (Ophthalmology)   HPI  [name] is a 79 y.o. female Seen in followup for ICD implanted at [facility] in 2007 for generator change by Dr. Philipp Deputy 2012. She has  nonischemic heart disease and congestive heart failure.  When we first met last month it was noted on x-ray there the lead had dislodged and her LV output was inactivated. Her functional status to her at that time was sufficient. We also discussed issues regarding ICD at end of life.  We began on carvedilol. She did not tolerate it because of fatigue. In fact that fatigue is a major complaint.  She brings in records from home where her blood pressure is noted to range from the high 70s to the frequent measurements of 100. She notes a strong correlation between low blood pressure and fatigue.  Past Medical History  Diagnosis Date  . Nonischemic cardiomyopathy     Cath without coronary disease 2006  . Systolic CHF   . Hyperlipidemia   . Hypertension   . Osteoporosis   . Urge incontinence   . Raynaud phenomenon   . PAF (paroxysmal atrial fibrillation)     Short bursts of PAF. Asymptomatic.  Sees Dr Clarene Duke.  . Biventricular ICD (implantable cardioverter-defibrillator)-[employer]     Date of implant 2007 generator change 2012  . Sinus tachycardia   . Macro dislodgment of the left ventricular lead 09/09/2012    Past Surgical History  Procedure Laterality Date  . Appendectomy    . Biventricular icd implant  06/28/2010    St Jude Implantable Defibrillator. Model No [id number]. Serial no [id number].  Placed by [facility]  . Bladder tack    . Cardiac catheterization  03/2005    EF 25%, Normal arteries, Nonischemic CM  . Abdominal hysterectomy  1970s  . Suprapubic urethrovesicular suspension  1992    Current Outpatient Prescriptions  Medication Sig Dispense Refill  . acetaminophen  (TYLENOL) 650 MG suppository Take 650 mg by mouth 2 (two) times daily.      Marland Kitchen aspirin 81 MG EC tablet Take 81 mg by mouth once.       . carvedilol (COREG) 12.5 MG tablet Take 12.5 mg by mouth 2 (two) times daily. 1/2 in the am and 1 tab in the pm.      . Cholecalciferol (VITAMIN D-3) 1000 UNITS CAPS Take 1 capsule by mouth daily.      . Cranberry (SM CRANBERRY) 300 MG tablet Take 300 mg by mouth 3 (three) times daily.      . digoxin (LANOXIN) 0.125 MG tablet TAKE ONE TABLET BY MOUTH EVERY DAY  90 tablet  2  . docusate sodium (COLACE) 100 MG capsule Take 200 mg by mouth once.      . fish oil-omega-3 fatty acids 1000 MG capsule Take 3 g by mouth daily.       . furosemide (LASIX) 40 MG tablet Take 1 tablet by mouth once a day as needed breathlessness or swelling  30 tablet  4  . losartan (COZAAR) 50 MG tablet Take 1 tablet (50 mg total) by mouth daily.  90 tablet  3   No current facility-administered medications for this visit.    Allergies  Allergen Reactions  . Sulfamethoxazole     REACTION: unspecified    Review of Systems negative except from HPI and PMH  Physical Exam BP  159/66  Pulse 77  Ht 5\' 2"  (1.575 m)  Wt 114 lb 12.8 oz (52.073 kg)  BMI 20.99 kg/m2 Well developed and well nourished in no acute distress HENT normal E scleral and icterus clear Neck Supple JVP flat; carotids brisk and full Clear to ausculation Device pocket well healed; without hematoma or erythema.  There is no tethering  Regular rate and rhythm, no murmurs gallops or rub Soft with active bowel sounds No clubbing cyanosis none Edema Alert and oriented, grossly normal motor and sensory function Skin Warm and Dry  ECG demonstrates sinus rhythm with left bundle branch block  Assessment and  Plan

## 2012-10-12 NOTE — Assessment & Plan Note (Addendum)
She is currently euvolemic. she has problems however with hypotension. We will decrease her carvedilol to 3.125 twice daily, decrease her Cozaar from 50--25 tablet at night, and we will add Aldactone for secondary benefits. Renal function was normal January 2013 and potassium levels  Was 4.5 we will recheck the potassium level to 3 weeks 6 weeks and 12 weeks  We discussed CRT upgrade to be: She is not inclined

## 2012-10-12 NOTE — Patient Instructions (Signed)
Your physician has recommended you make the following change in your medication:  1) Decrease coreg (carvedilol) to 3.125 mg one tablet by mouth twice daily. 2) Decrease cozaar (losartan) to 25 mg one tablet by mouth at bedtime. 3) Start aldactone (spironolactone) 25 mg 1/2 tablet by mouth once daily.  Your physician recommends that you return for lab work in: 3 weeks (BMP) & 6 weeks (BMP)  Your physician wants you to follow-up in: 12 weeks with Dr. Graciela Husbands. You will receive a reminder letter in the mail two months in advance. If you don't receive a letter, please call our office to schedule the follow-up appointment.

## 2012-10-15 ENCOUNTER — Encounter: Payer: Self-pay | Admitting: Internal Medicine

## 2012-10-18 ENCOUNTER — Encounter: Payer: Self-pay | Admitting: Home Health Services

## 2012-10-21 IMAGING — CR DG KNEE COMPLETE 4+V*L*
4 series · 4 of 4 positions shown · non-contrast
Comparison: None.

CLINICAL DATA: Chronic left knee pain.

LEFT KNEE - COMPLETE 4+ VIEW

[t knee ap left]
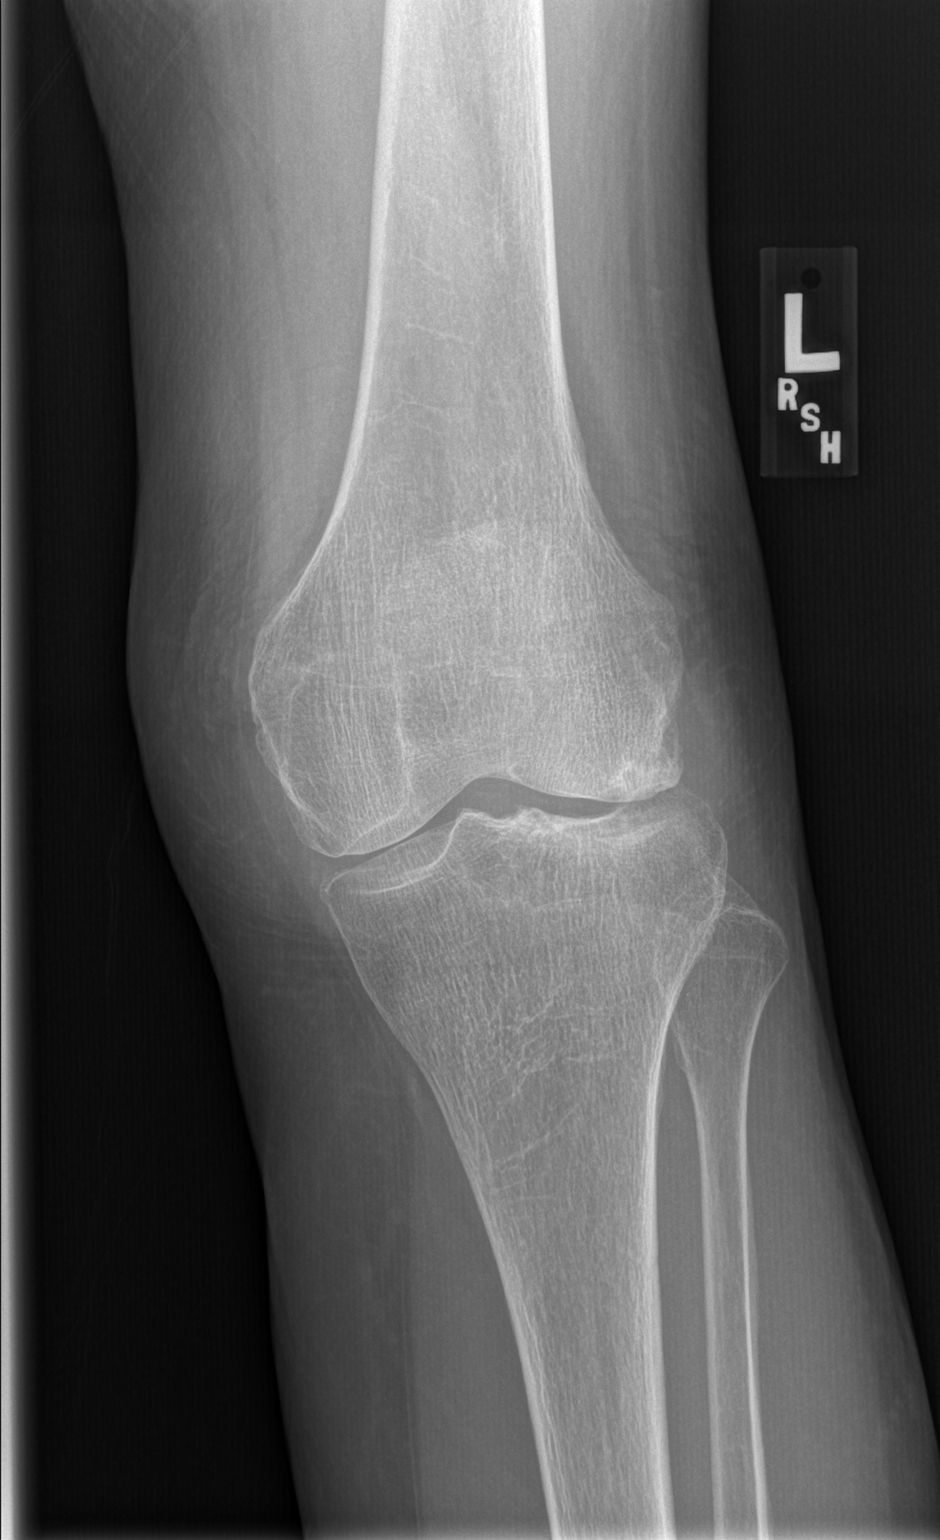

[t knee obl left (1 of 2)]
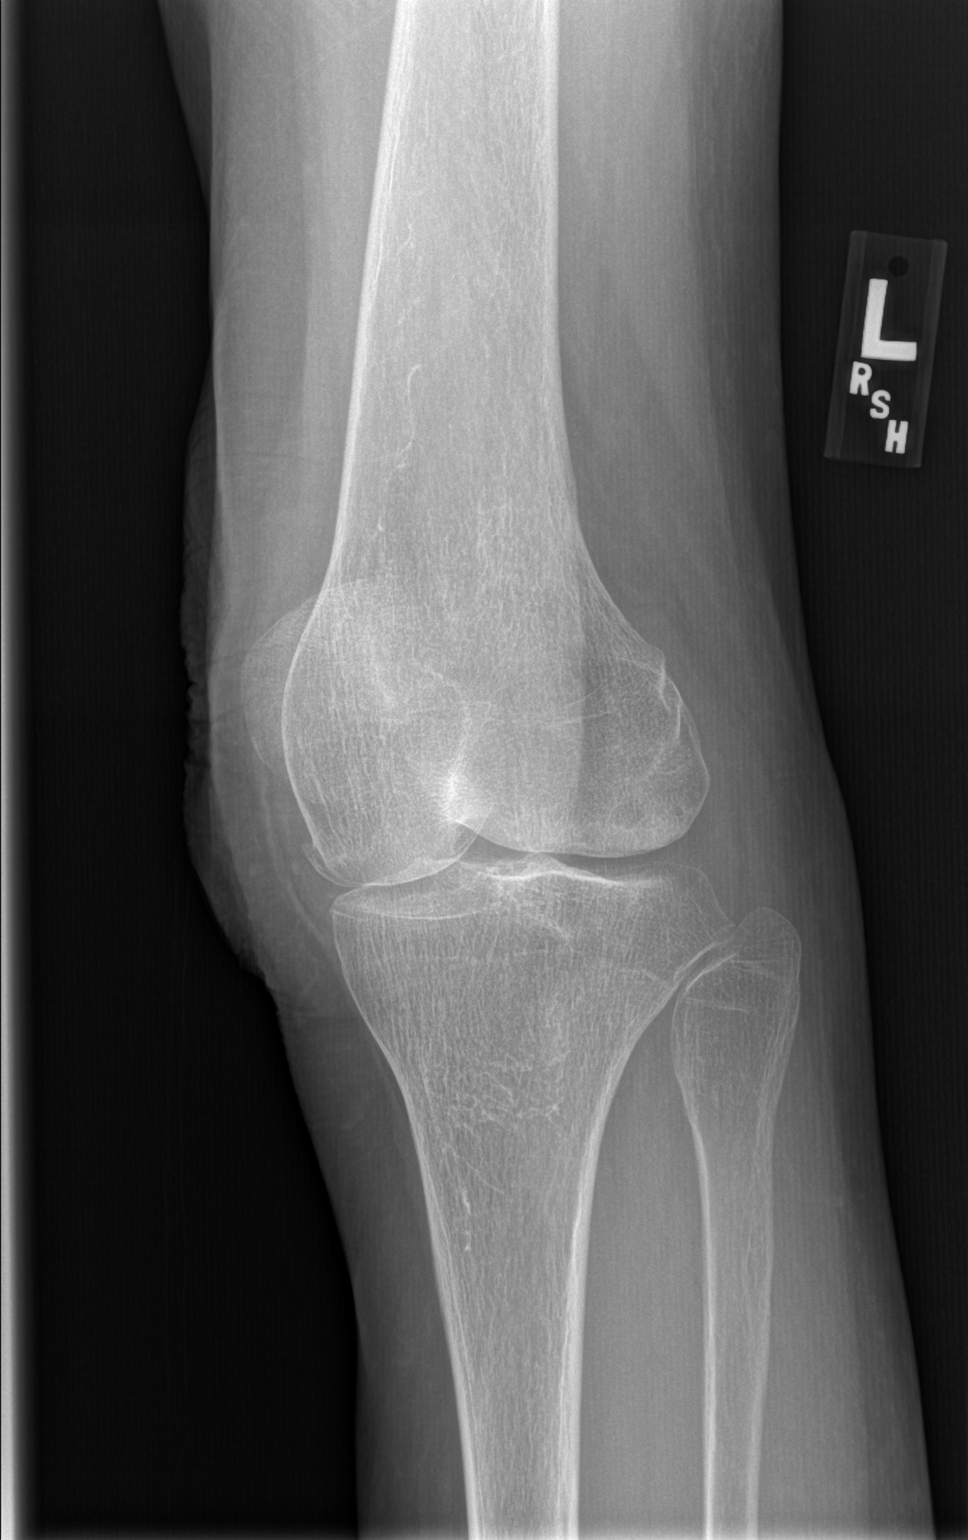

[t knee obl left (2 of 2)]
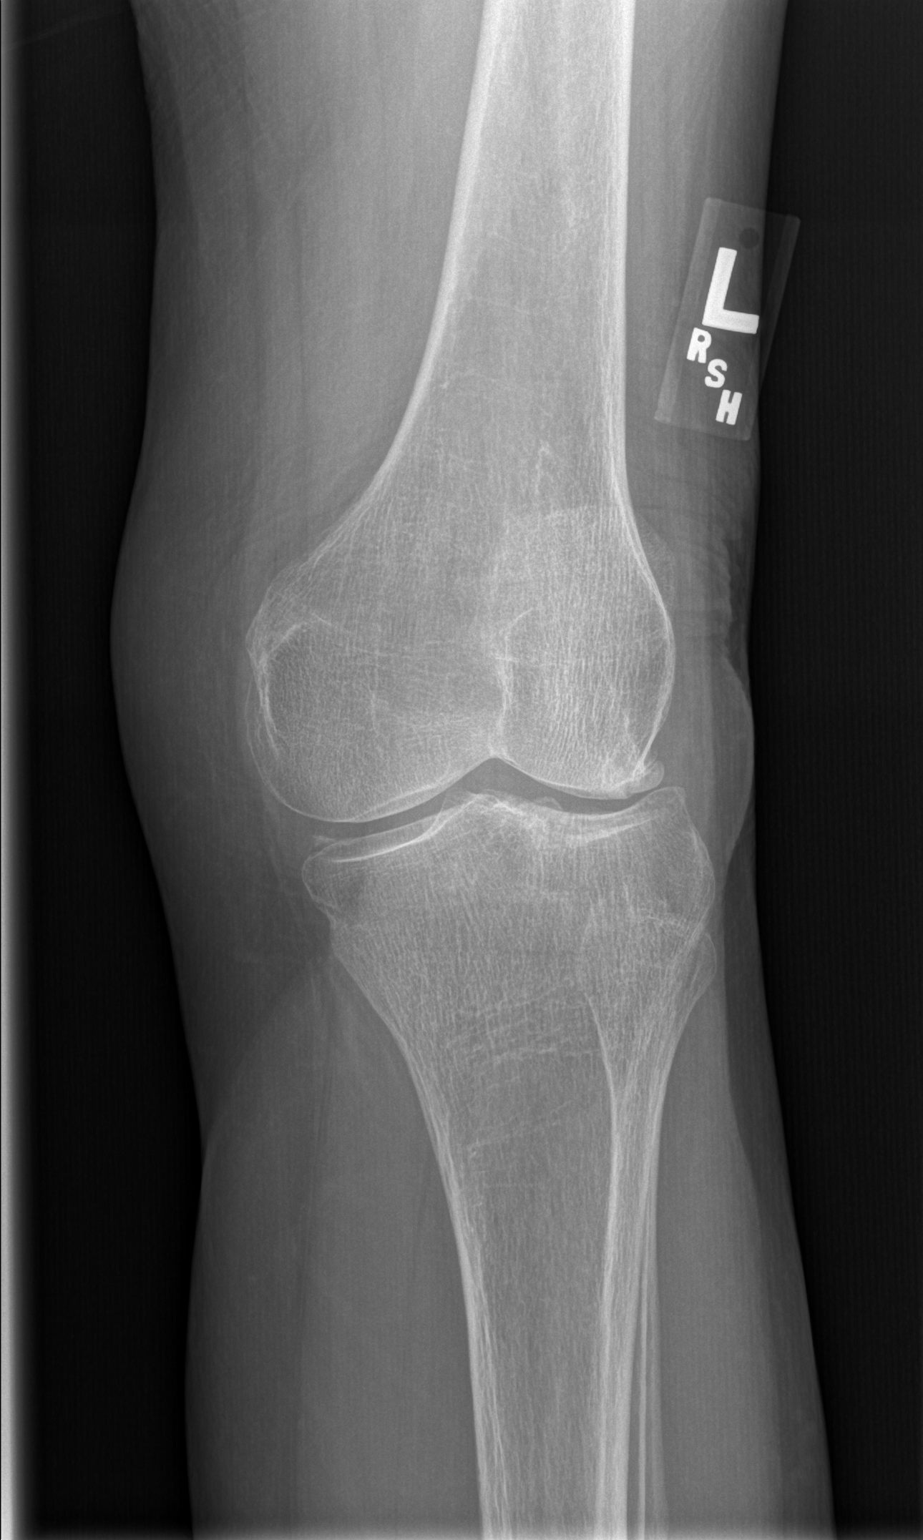

[t knee lat left]
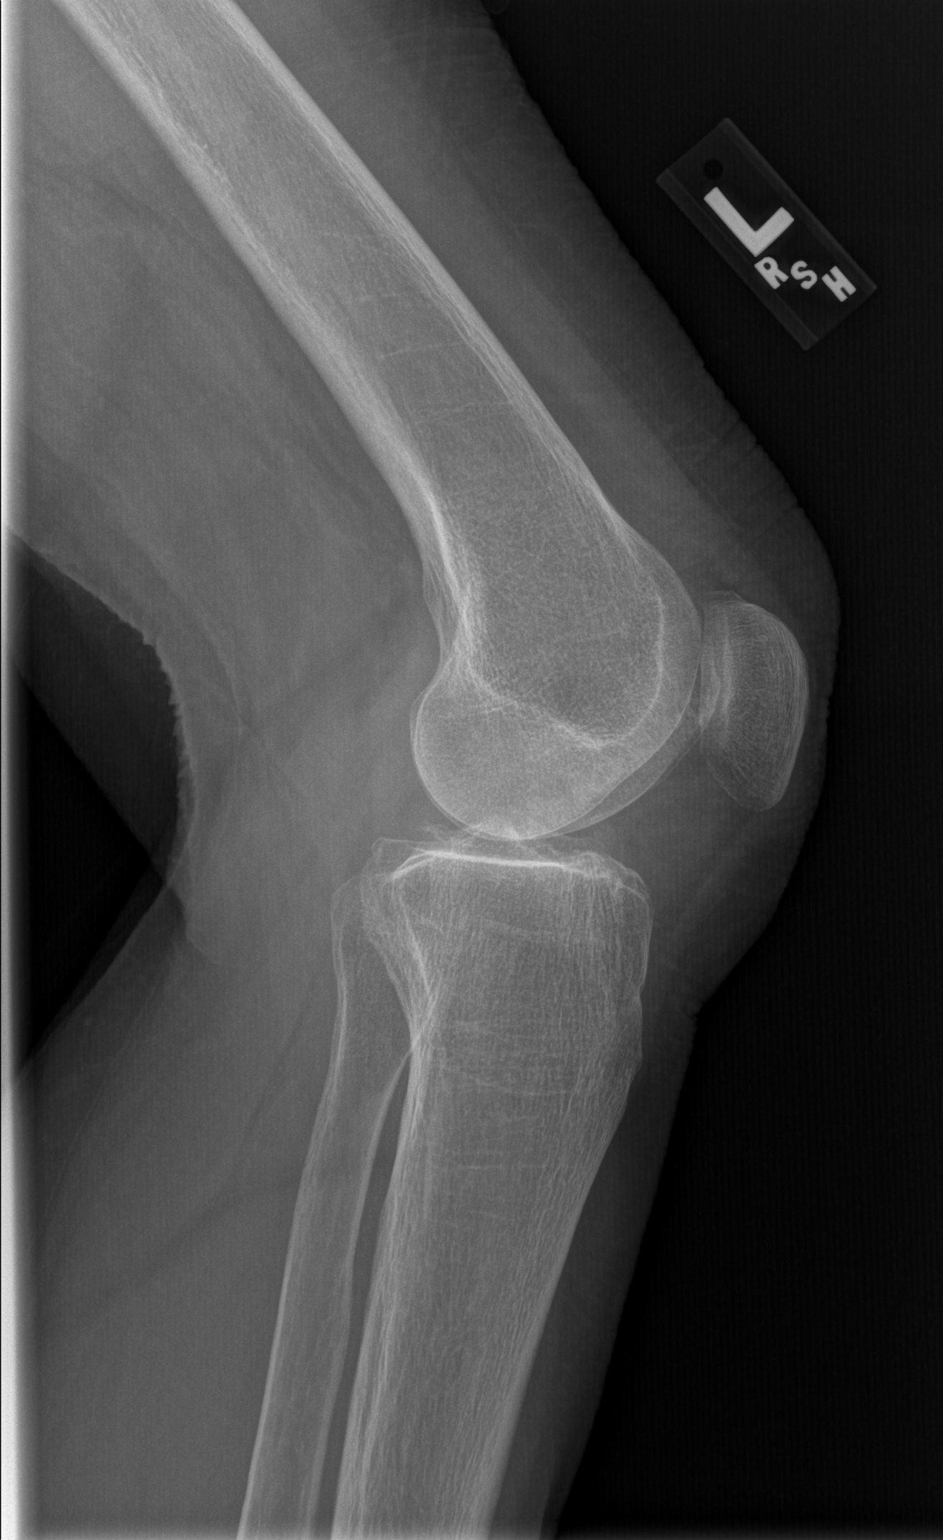

[4 of 4 positions shown; findings below may reference images not displayed]

FINDINGS: Mild tricompartment degenerative changes most notable
lateral tibiofemoral joint space.  Suggestion of mild
chondrocalcinosis changes.
IMPRESSION: Mild tricompartment degenerative changes most notable lateral
tibiofemoral joint space.  Suggestion of mild chondrocalcinosis
changes.

## 2012-11-02 ENCOUNTER — Other Ambulatory Visit: Payer: Self-pay | Admitting: *Deleted

## 2012-11-02 ENCOUNTER — Other Ambulatory Visit (INDEPENDENT_AMBULATORY_CARE_PROVIDER_SITE_OTHER): Payer: Medicare HMO

## 2012-11-02 DIAGNOSIS — I5022 Chronic systolic (congestive) heart failure: Secondary | ICD-10-CM

## 2012-11-02 DIAGNOSIS — I428 Other cardiomyopathies: Secondary | ICD-10-CM

## 2012-11-02 DIAGNOSIS — E875 Hyperkalemia: Secondary | ICD-10-CM

## 2012-11-02 LAB — BASIC METABOLIC PANEL
CO2: 27 mEq/L (ref 19–32)
Calcium: 9.9 mg/dL (ref 8.4–10.5)
Creatinine, Ser: 0.8 mg/dL (ref 0.4–1.2)

## 2012-11-03 ENCOUNTER — Telehealth: Payer: Self-pay | Admitting: Internal Medicine

## 2012-11-03 NOTE — Telephone Encounter (Signed)
New Prob   Patient states she received a call lab results was advised to stop taking a potassium pill// pt states she is taking another potassium and inquires if she should stop taking that one as well// pt requests a call back

## 2012-11-03 NOTE — Telephone Encounter (Signed)
Spoke with pt, losartan potassium and hyperkalemia discussed and questions answered.

## 2012-11-09 ENCOUNTER — Encounter: Payer: Self-pay | Admitting: Internal Medicine

## 2012-11-10 ENCOUNTER — Other Ambulatory Visit (INDEPENDENT_AMBULATORY_CARE_PROVIDER_SITE_OTHER): Payer: Medicare HMO

## 2012-11-10 DIAGNOSIS — E875 Hyperkalemia: Secondary | ICD-10-CM

## 2012-11-10 LAB — BASIC METABOLIC PANEL
BUN: 10 mg/dL (ref 6–23)
Calcium: 9.2 mg/dL (ref 8.4–10.5)
GFR: 89.83 mL/min (ref >60.00)
Potassium: 4.3 mEq/L (ref 3.5–5.1)
Sodium: 137 mEq/L (ref 135–145)

## 2012-11-23 ENCOUNTER — Other Ambulatory Visit: Payer: Medicare HMO

## 2012-12-06 ENCOUNTER — Telehealth: Payer: Self-pay | Admitting: Internal Medicine

## 2012-12-06 NOTE — Telephone Encounter (Signed)
Called patient back. She is a former patient of Dr.Little who now sees Dr.Klein for AICD follow up. States that she has a lead that does not work and she is concerned about this. She is complaining of the past 3 nights having mid sternal chest pain with pains in both arms lasting about 25 to 30 minutes. States that she rubs her arms and eventually feels better. This occurs when she is ready to go to sleep in the evening. Advised her to see Dr.Klein tomorrow at 11 15am.

## 2012-12-07 ENCOUNTER — Encounter: Payer: Self-pay | Admitting: Internal Medicine

## 2012-12-07 ENCOUNTER — Encounter: Payer: Self-pay | Admitting: *Deleted

## 2012-12-07 ENCOUNTER — Ambulatory Visit (INDEPENDENT_AMBULATORY_CARE_PROVIDER_SITE_OTHER): Payer: Medicare HMO | Admitting: Internal Medicine

## 2012-12-07 ENCOUNTER — Encounter (HOSPITAL_COMMUNITY): Payer: Self-pay | Admitting: Pharmacy Technician

## 2012-12-07 ENCOUNTER — Encounter (HOSPITAL_COMMUNITY): Payer: Self-pay

## 2012-12-07 VITALS — BP 138/80 | HR 50 | Ht 63.0 in | Wt 111.6 lb

## 2012-12-07 DIAGNOSIS — I208 Other forms of angina pectoris: Secondary | ICD-10-CM

## 2012-12-07 DIAGNOSIS — I5022 Chronic systolic (congestive) heart failure: Secondary | ICD-10-CM

## 2012-12-07 DIAGNOSIS — R079 Chest pain, unspecified: Secondary | ICD-10-CM

## 2012-12-07 NOTE — Progress Notes (Signed)
Patient Care Team: Latrelle Dodrill, MD as PCP - General (Pediatrics) Judene Companion, MD (Cardiology) Dorothyann Gibbs, MD (Ophthalmology)   HPI  [name] is a 79 y.o. female Seen in followup for ICD implanted at [facility] in 2007 for generator change by Dr. Erlinda Hong 2012. She has nonischemic heart disease and congestive heart failure.    When we first met last month it was noted on x-ray there the LV lead had dislodged and  output was inactivate . Her functional status to her at that time was sufficient. We also discussed issues regarding ICD at end of life.   We tried her on afterload reduction which was complicated heart by hypotension with which was associated symptomatic fatigue.  We does down titrated her medications    She continues to have complaints of significant exercise intolerance.  She is also noted nocturnal chest discomfort with radiation to her jaw and shoulders. She has some discomfort also with exertion albeit somewhat different and sensation.  Review of her data catheterization that was negative in 2006 for coronary disease. A Myoview scan 2011 demonstrated normal left ventricular function. An echo 2013 demonstrated ejection fraction 40% and an ejection fraction of 25% was noted at echo 7/14.  She has no edema.  Her mentally retarded 31 year old son lives at home.  Past Medical History      Past Medical History  Diagnosis Date  . Nonischemic cardiomyopathy     Cath without coronary disease 2006  . Systolic CHF   . Hyperlipidemia   . Hypertension   . Osteoporosis   . Urge incontinence   . Raynaud phenomenon   . PAF (paroxysmal atrial fibrillation)     Short bursts of PAF. Asymptomatic.  Sees Dr Clarene Duke.  . Biventricular ICD (implantable cardioverter-defibrillator)-[employer]     Date of implant 2007 generator change 2012  . Sinus tachycardia   . Macro dislodgment of the left ventricular lead 09/09/2012    Past Surgical History  Procedure  Laterality Date  . Appendectomy    . Biventricular icd implant  06/28/2010    St Jude Implantable Defibrillator. Model No [id number]. Serial no [id number].  Placed by [facility]  . Bladder tack    . Cardiac catheterization  03/2005    EF 25%, Normal arteries, Nonischemic CM  . Abdominal hysterectomy  1970s  . Suprapubic urethrovesicular suspension  1992    Current Outpatient Prescriptions  Medication Sig Dispense Refill  . acetaminophen (TYLENOL) 650 MG suppository Take 650 mg by mouth 2 (two) times daily.      Marland Kitchen aspirin 81 MG EC tablet Take 81 mg by mouth once.       . carvedilol (COREG) 3.125 MG tablet Take 1 tablet (3.125 mg total) by mouth 2 (two) times daily with a meal.  60 tablet  11  . Cholecalciferol (VITAMIN D-3) 1000 UNITS CAPS Take 1 capsule by mouth daily.      . Cranberry (SM CRANBERRY) 300 MG tablet Take 300 mg by mouth 3 (three) times daily.      . digoxin (LANOXIN) 0.125 MG tablet TAKE ONE TABLET BY MOUTH EVERY DAY  90 tablet  2  . docusate sodium (COLACE) 100 MG capsule Take 200 mg by mouth once.      . fish oil-omega-3 fatty acids 1000 MG capsule Take 3 g by mouth daily.       . furosemide (LASIX) 40 MG tablet Take 1 tablet by mouth once a day as  needed breathlessness or swelling  30 tablet  4  . losartan (COZAAR) 25 MG tablet Take one tablet by mouth at bedtime  30 tablet  11   No current facility-administered medications for this visit.    Allergies  Allergen Reactions  . Sulfamethoxazole     REACTION: unspecified    Review of Systems negative except from HPI and PMH  Physical Exam BP 138/80  Pulse 50  Ht 5\' 3"  (1.6 m)  Wt 111 lb 9.6 oz (50.621 kg)  BMI 19.77 kg/m2 Well developed and well nourished in no acute distress HENT normal E scleral and icterus clear Neck Supple JVP9- 10arotids brisk and full Clear to ausculation  Regular rate and rhythm, no murmurs gallops or rub Soft with active bowel sounds No clubbing cyanosis Trace  Edema Alert and oriented, grossly normal motor and sensory function Skin Warm and Dry   ECG demonstrates sinus rhythm with left bundle branch block with a QRS duration 138 msec  Assessment and  Plan  1. Cardiomyopathy-progressive. In the context of of chest pain, question is raised as to whether this is ischemic. We'll undertake catheterization. We will take the right and left heart catheterization given left ventricular dysfunction and heart failure.  We will check Dig level  2. Congestive heart failure-chronic-systolic:  With progressive cardiac dysfunction we'll try to clarify the mechanism specifically with catheterization. In the event that this is not of her opportunity for specific treatments,  she is open to reconsidering left ventricular lead revision   3.CRT- stable    4. Left ventricular lead dislodgment   As above

## 2012-12-07 NOTE — Patient Instructions (Addendum)
Your physician has requested that you have a cardiac catheterization. Cardiac catheterization is used to diagnose and/or treat various heart conditions. Doctors may recommend this procedure for a number of different reasons. The most common reason is to evaluate chest pain. Chest pain can be a symptom of coronary artery disease (CAD), and cardiac catheterization can show whether plaque is narrowing or blocking your heart's arteries. This procedure is also used to evaluate the valves, as well as measure the blood flow and oxygen levels in different parts of your heart. For further information please visit [email or web address] Please follow instruction sheet, as given.  Your physician recommends that you continue on your current medications as directed. Please refer to the Current Medication list given to you today.  Your physician recommends that you return for lab work on: 12/08/12

## 2012-12-08 ENCOUNTER — Other Ambulatory Visit (INDEPENDENT_AMBULATORY_CARE_PROVIDER_SITE_OTHER): Payer: Medicare HMO

## 2012-12-08 DIAGNOSIS — I5022 Chronic systolic (congestive) heart failure: Secondary | ICD-10-CM

## 2012-12-08 LAB — BASIC METABOLIC PANEL
BUN: 15 mg/dL (ref 6–23)
CO2: 27 mEq/L (ref 19–32)
Calcium: 9.4 mg/dL (ref 8.4–10.5)
Chloride: 102 mEq/L (ref 96–112)
Creatinine, Ser: 0.7 mg/dL (ref 0.4–1.2)
GFR: 93.01 mL/min (ref >60.00)
Glucose, Bld: 98 mg/dL (ref 70–99)
Potassium: 4 mEq/L (ref 3.5–5.1)
Sodium: 136 mEq/L (ref 135–145)

## 2012-12-08 LAB — CBC WITH DIFFERENTIAL/PLATELET
Basophils Absolute: 0 10*3/uL (ref 0.0–0.1)
Eosinophils Absolute: 0.2 10*3/uL (ref 0.0–0.7)
HCT: 43.3 % (ref 36.0–46.0)
Hemoglobin: 14.5 g/dL (ref 12.0–15.0)
Lymphs Abs: 2.3 10*3/uL (ref 0.7–4.0)
MCHC: 33.4 g/dL (ref 30.0–36.0)
Monocytes Relative: 12.1 % — ABNORMAL HIGH (ref 3.0–12.0)
Neutro Abs: 4.1 10*3/uL (ref 1.4–7.7)
Platelets: 157 10*3/uL (ref 150.0–400.0)
RDW: 14 % (ref 11.5–14.6)

## 2012-12-08 LAB — PROTIME-INR: Prothrombin Time: 11.3 s (ref 10.2–12.4)

## 2012-12-10 ENCOUNTER — Ambulatory Visit: Payer: Medicare HMO | Admitting: Home Health Services

## 2012-12-13 ENCOUNTER — Encounter (HOSPITAL_COMMUNITY)
Admission: RE | Disposition: A | Discharge status: Home / Self Care | Payer: Self-pay | Source: Ambulatory Visit | Attending: Cardiovascular Disease

## 2012-12-13 ENCOUNTER — Ambulatory Visit (HOSPITAL_COMMUNITY)
Admission: RE | Admit: 2012-12-13 | Discharge: 2012-12-13 | Disposition: A | Discharge status: Home / Self Care | Payer: Medicare HMO | Source: Ambulatory Visit | Attending: Cardiovascular Disease | Admitting: Cardiovascular Disease

## 2012-12-13 DIAGNOSIS — E785 Hyperlipidemia, unspecified: Secondary | ICD-10-CM | POA: Insufficient documentation

## 2012-12-13 DIAGNOSIS — R079 Chest pain, unspecified: Secondary | ICD-10-CM

## 2012-12-13 DIAGNOSIS — I509 Heart failure, unspecified: Secondary | ICD-10-CM | POA: Insufficient documentation

## 2012-12-13 DIAGNOSIS — I5022 Chronic systolic (congestive) heart failure: Secondary | ICD-10-CM | POA: Insufficient documentation

## 2012-12-13 DIAGNOSIS — I2510 Atherosclerotic heart disease of native coronary artery without angina pectoris: Secondary | ICD-10-CM | POA: Insufficient documentation

## 2012-12-13 DIAGNOSIS — I059 Rheumatic mitral valve disease, unspecified: Secondary | ICD-10-CM | POA: Insufficient documentation

## 2012-12-13 DIAGNOSIS — I10 Essential (primary) hypertension: Secondary | ICD-10-CM | POA: Insufficient documentation

## 2012-12-13 DIAGNOSIS — I208 Other forms of angina pectoris: Secondary | ICD-10-CM

## 2012-12-13 HISTORY — PX: LEFT AND RIGHT HEART CATHETERIZATION WITH CORONARY ANGIOGRAM: SHX5449

## 2012-12-13 LAB — POCT I-STAT 3, VENOUS BLOOD GAS (G3P V)
Bicarbonate: 25.4 mEq/L — ABNORMAL HIGH (ref 20.0–24.0)
O2 Saturation: 63 %
TCO2: 27 mmol/L (ref 0–100)
pCO2, Ven: 46 mmHg (ref 45.0–50.0)
pO2, Ven: 35 mmHg (ref 30.0–45.0)

## 2012-12-13 LAB — POCT I-STAT 3, ART BLOOD GAS (G3+)
Acid-base deficit: 1 mmol/L (ref 0.0–2.0)
Bicarbonate: 24.2 mEq/L — ABNORMAL HIGH (ref 20.0–24.0)
pH, Arterial: 7.392 (ref 7.350–7.450)
pO2, Arterial: 76 mmHg — ABNORMAL LOW (ref 80.0–100.0)

## 2012-12-13 SURGERY — LEFT AND RIGHT HEART CATHETERIZATION WITH CORONARY ANGIOGRAM
Anesthesia: LOCAL

## 2012-12-13 MED ORDER — FENTANYL CITRATE 0.05 MG/ML IJ SOLN
INTRAMUSCULAR | Status: AC
  Filled 2012-12-13: qty 2

## 2012-12-13 MED ORDER — SODIUM CHLORIDE 0.9 % IV SOLN
INTRAVENOUS | Status: DC
  Administered 2012-12-13: 07:00:00 via INTRAVENOUS

## 2012-12-13 MED ORDER — SODIUM CHLORIDE 0.9 % IV SOLN: 1.0000 mL/kg/h | INTRAVENOUS | Status: DC

## 2012-12-13 MED ORDER — MIDAZOLAM HCL 2 MG/2ML IJ SOLN
INTRAMUSCULAR | Status: AC
  Filled 2012-12-13: qty 2

## 2012-12-13 MED ORDER — DIAZEPAM 5 MG PO TABS
ORAL_TABLET | ORAL | Status: AC
  Filled 2012-12-13: qty 1

## 2012-12-13 MED ORDER — DIAZEPAM 5 MG PO TABS
5.0000 mg | ORAL_TABLET | ORAL | Status: AC
  Administered 2012-12-13: 5 mg via ORAL

## 2012-12-13 MED ORDER — ACETAMINOPHEN 325 MG PO TABS: 650.0000 mg | ORAL_TABLET | ORAL | Status: DC | PRN

## 2012-12-13 MED ORDER — NITROGLYCERIN 0.2 MG/ML ON CALL CATH LAB
INTRAVENOUS | Status: AC
  Filled 2012-12-13: qty 1

## 2012-12-13 MED ORDER — LIDOCAINE HCL (PF) 1 % IJ SOLN
INTRAMUSCULAR | Status: AC
  Filled 2012-12-13: qty 30

## 2012-12-13 MED ORDER — ONDANSETRON HCL 4 MG/2ML IJ SOLN: 4.0000 mg | Freq: Four times a day (QID) | INTRAMUSCULAR | Status: DC | PRN

## 2012-12-13 MED ORDER — HEPARIN (PORCINE) IN NACL 2-0.9 UNIT/ML-% IJ SOLN
INTRAMUSCULAR | Status: AC
  Filled 2012-12-13: qty 1500

## 2012-12-13 NOTE — CV Procedure (Signed)
   Cardiac Catheterization Procedure Note  Name: [name] MRN: [id number] DOB: Jan 24, 1930  Procedure: Right Heart Cath, Left Heart Cath, Selective Coronary Angiography, LV angiography  Indication: CHF, NYHA Class 3  Procedural Details: The right groin was prepped, draped, and anesthetized with 1% lidocaine. Using the modified Seldinger technique a 5 French sheath was placed in the right femoral artery and a 7 French sheath was placed in the right femoral vein. A Swan-Ganz catheter was used for the right heart catheterization. Standard protocol was followed for recording of right heart pressures and sampling of oxygen saturations. Fick cardiac output was calculated. Standard Judkins catheters were used for selective coronary angiography and left ventriculography. There were no immediate procedural complications. The patient was transferred to the post catheterization recovery area for further monitoring.  Procedural Findings: Hemodynamics RA 2 RV 25/3 PA 26/8 mean 17 PCWP 8 LV 156/12 AO 151/61 mean 97  Oxygen saturations: PA 63 AO 95  Cardiac Output (Fick) 3.2  Cardiac Index (Fick) 2.1   Coronary angiography: Coronary dominance: right  Left mainstem: Widely patent, no obstructive disease. Arises from the left cusp and divides into the LAD and LCx.  Left anterior descending (LAD): normal caliber vessel. Mild diffuse 20% stenosis in proximal vessel. There is a high/diagonal intermediate vessel without significant disease. The second diagonal is large in caliber with diffuse irregularity noted. There is moderate proximal/mid-LAD calcification.  Left circumflex (LCx): Large vessel without significant disease. There is a large OM branch without significant stenosis.  Right coronary artery (RCA): Normal caliber vessel. The patient is right-dominant. The PDA and PLA vessels are patent. The proximal RCA has minor 20% stenosis.   Left ventriculography: There is ventricular  dysynchrony. The LVEF is estimated at 35-40%. There is mild MR present.  Final Conclusions:   1. Patent coronary arteries with mild nonobstructive CAD as detailed above 2. Normal intracardiac hemodynamics 3. Moderate LV dysfunction with mild-moderate MR  Tonny Bollman 12/13/2012, 8:27 AM

## 2012-12-13 NOTE — H&P (View-Only) (Signed)
Patient Care Team: Brittany J McIntyre, MD as PCP - General (Pediatrics) Alfred B Little, MD (Cardiology) Robert L Groat, MD (Ophthalmology)   HPI  [name] is a 79 y.o. female Seen in followup for ICD implanted at [facility] in 2007 for generator change by Dr. MC 2012. She has nonischemic heart disease and congestive heart failure.    When we first met last month it was noted on x-ray there the LV lead had dislodged and  output was inactivate . Her functional status to her at that time was sufficient. We also discussed issues regarding ICD at end of life.   We tried her on afterload reduction which was complicated heart by hypotension with which was associated symptomatic fatigue.  We does down titrated her medications    She continues to have complaints of significant exercise intolerance.  She is also noted nocturnal chest discomfort with radiation to her jaw and shoulders. She has some discomfort also with exertion albeit somewhat different and sensation.  Review of her data catheterization that was negative in 2006 for coronary disease. A Myoview scan 2011 demonstrated normal left ventricular function. An echo 2013 demonstrated ejection fraction 40% and an ejection fraction of 25% was noted at echo 7/14.  She has no edema.  Her mentally retarded 60-year-old son lives at home.  Past Medical History      Past Medical History  Diagnosis Date  . Nonischemic cardiomyopathy     Cath without coronary disease 2006  . Systolic CHF   . Hyperlipidemia   . Hypertension   . Osteoporosis   . Urge incontinence   . Raynaud phenomenon   . PAF (paroxysmal atrial fibrillation)     Short bursts of PAF. Asymptomatic.  Sees Dr Little.  . Biventricular ICD (implantable cardioverter-defibrillator)-[employer]     Date of implant 2007 generator change 2012  . Sinus tachycardia   . Macro dislodgment of the left ventricular lead 09/09/2012    Past Surgical History  Procedure  Laterality Date  . Appendectomy    . Biventricular icd implant  06/28/2010    St Jude Implantable Defibrillator. Model No [id number]. Serial no [id number].  Placed by [facility]  . Bladder tack    . Cardiac catheterization  03/2005    EF 25%, Normal arteries, Nonischemic CM  . Abdominal hysterectomy  1970s  . Suprapubic urethrovesicular suspension  1992    Current Outpatient Prescriptions  Medication Sig Dispense Refill  . acetaminophen (TYLENOL) 650 MG suppository Take 650 mg by mouth 2 (two) times daily.      . aspirin 81 MG EC tablet Take 81 mg by mouth once.       . carvedilol (COREG) 3.125 MG tablet Take 1 tablet (3.125 mg total) by mouth 2 (two) times daily with a meal.  60 tablet  11  . Cholecalciferol (VITAMIN D-3) 1000 UNITS CAPS Take 1 capsule by mouth daily.      . Cranberry (SM CRANBERRY) 300 MG tablet Take 300 mg by mouth 3 (three) times daily.      . digoxin (LANOXIN) 0.125 MG tablet TAKE ONE TABLET BY MOUTH EVERY DAY  90 tablet  2  . docusate sodium (COLACE) 100 MG capsule Take 200 mg by mouth once.      . fish oil-omega-3 fatty acids 1000 MG capsule Take 3 g by mouth daily.       . furosemide (LASIX) 40 MG tablet Take 1 tablet by mouth once a day as   needed breathlessness or swelling  30 tablet  4  . losartan (COZAAR) 25 MG tablet Take one tablet by mouth at bedtime  30 tablet  11   No current facility-administered medications for this visit.    Allergies  Allergen Reactions  . Sulfamethoxazole     REACTION: unspecified    Review of Systems negative except from HPI and PMH  Physical Exam BP 138/80  Pulse 50  Ht 5' 3" (1.6 m)  Wt 111 lb 9.6 oz (50.621 kg)  BMI 19.77 kg/m2 Well developed and well nourished in no acute distress HENT normal E scleral and icterus clear Neck Supple JVP9- 10arotids brisk and full Clear to ausculation  Regular rate and rhythm, no murmurs gallops or rub Soft with active bowel sounds No clubbing cyanosis Trace  Edema Alert and oriented, grossly normal motor and sensory function Skin Warm and Dry   ECG demonstrates sinus rhythm with left bundle branch block with a QRS duration 138 msec  Assessment and  Plan  1. Cardiomyopathy-progressive. In the context of of chest pain, question is raised as to whether this is ischemic. We'll undertake catheterization. We will take the right and left heart catheterization given left ventricular dysfunction and heart failure.  We will check Dig level  2. Congestive heart failure-chronic-systolic:  With progressive cardiac dysfunction we'll try to clarify the mechanism specifically with catheterization. In the event that this is not of her opportunity for specific treatments,  she is open to reconsidering left ventricular lead revision   3.CRT- stable    4. Left ventricular lead dislodgment   As above  

## 2012-12-13 NOTE — Interval H&P Note (Signed)
History and Physical Interval Note:  12/13/2012 7:50 AM  [name]  has presented today for surgery, with the diagnosis of Chest pain  The various methods of treatment have been discussed with the patient and family. After consideration of risks, benefits and other options for treatment, the patient has consented to  Procedure(s): LEFT AND RIGHT HEART CATHETERIZATION WITH CORONARY ANGIOGRAM (N/A) as a surgical intervention .  The patient's history has been reviewed, patient examined, no change in status, stable for surgery.  I have reviewed the patient's chart and labs.  Questions were answered to the patient's satisfaction.    Cath Lab Visit (complete for each Cath Lab visit)  Clinical Evaluation Leading to the Procedure:   ACS: no  Non-ACS:    Anginal Classification: CCS III  Anti-ischemic medical therapy: Minimal Therapy (1 class of medications)  Non-Invasive Test Results: No non-invasive testing performed  Prior CABG: No previous CABG  Tonny Bollman

## 2013-01-10 ENCOUNTER — Encounter: Payer: Self-pay | Admitting: Home Health Services

## 2013-01-10 ENCOUNTER — Ambulatory Visit (INDEPENDENT_AMBULATORY_CARE_PROVIDER_SITE_OTHER): Payer: Medicare HMO | Admitting: Home Health Services

## 2013-01-10 ENCOUNTER — Ambulatory Visit (INDEPENDENT_AMBULATORY_CARE_PROVIDER_SITE_OTHER): Payer: Medicare HMO | Admitting: *Deleted

## 2013-01-10 VITALS — BP 126/65 | HR 89 | Temp 97.0°F | Ht 63.0 in | Wt 113.8 lb

## 2013-01-10 DIAGNOSIS — Z23 Encounter for immunization: Secondary | ICD-10-CM

## 2013-01-10 DIAGNOSIS — Z0000 Encounter for general adult medical examination without abnormal findings: Secondary | ICD-10-CM

## 2013-01-10 NOTE — Progress Notes (Signed)
Patient here for annual wellness visit, patient reports: Risk Factors/Conditions needing evaluation or treatment: PT does not have any new risk factors that need evaluation. Home Safety: Pt lives with son in 1 story home.  Pt reports having walk-in showers and smoke detectors. Other Information: Corrective lens: Pt wears daily bi-focal.  Has eye visits every 1-2 years. Dentures: Pt has full dentures. Memory: Pt reports some memory problems. Patient's Mini Mental Score (recorded in doc. flowsheet): 29  Balance/Gait: Pt reports no fall in the past 12 months.   Pt reports feeling fatigued.  Pt reports no problems with hearing or vision.   Annual Wellness Visit Requirements Recorded Today In  Medical, family, social history Past Medical, Family, Social History Section  Current providers Care team  Current medications Medications  Wt, BP, Ht, BMI Vital signs  Tobacco, alcohol, illicit drug use History  ADL Nurse Assessment  Depression Screening Nurse Assessment  Cognitive impairment Nurse Assessment  Mini Mental Status Document Flowsheet  Fall Risk Fall/Depression  Home Safety Progress Note  End of Life Planning (welcome visit) Social Documentation  Medicare preventative services Progress Note  Risk factors/conditions needing evaluation/treatment Progress Note  Personalized health advice Patient Instructions, goals, letter  Diet & Exercise Social Documentation  Emergency Contact Social Documentation  Seat Belts Social Documentation  Sun exposure/protection Social Documentation

## 2013-01-11 ENCOUNTER — Encounter: Payer: Medicare HMO | Admitting: Internal Medicine

## 2013-01-12 ENCOUNTER — Encounter: Payer: Self-pay | Admitting: Internal Medicine

## 2013-01-12 ENCOUNTER — Ambulatory Visit (INDEPENDENT_AMBULATORY_CARE_PROVIDER_SITE_OTHER): Payer: Medicare HMO | Admitting: Internal Medicine

## 2013-01-12 VITALS — BP 143/74 | HR 86 | Ht 64.0 in | Wt 113.6 lb

## 2013-01-12 DIAGNOSIS — I4949 Other premature depolarization: Secondary | ICD-10-CM

## 2013-01-12 DIAGNOSIS — I5022 Chronic systolic (congestive) heart failure: Secondary | ICD-10-CM

## 2013-01-12 DIAGNOSIS — T82198A Other mechanical complication of other cardiac electronic device, initial encounter: Secondary | ICD-10-CM

## 2013-01-12 DIAGNOSIS — I493 Ventricular premature depolarization: Secondary | ICD-10-CM | POA: Insufficient documentation

## 2013-01-12 DIAGNOSIS — I428 Other cardiomyopathies: Secondary | ICD-10-CM

## 2013-01-12 DIAGNOSIS — I498 Other specified cardiac arrhythmias: Secondary | ICD-10-CM

## 2013-01-12 DIAGNOSIS — Z95810 Presence of automatic (implantable) cardiac defibrillator: Secondary | ICD-10-CM

## 2013-01-12 DIAGNOSIS — R000 Tachycardia, unspecified: Secondary | ICD-10-CM

## 2013-01-12 LAB — ICD DEVICE OBSERVATION
AL IMPEDENCE ICD: 337.5 Ohm
ATRIAL PACING ICD: 0.17 pct
BAMS-0001: 180 {beats}/min
BAMS-0003: 70 {beats}/min
TOT-0007: 0
TOT-0008: 0
VENTRICULAR PACING ICD: 0.96 pct

## 2013-01-12 NOTE — Assessment & Plan Note (Signed)
The patient's device was interrogated.  The information was reviewed. No changes were made in the programming.    

## 2013-01-12 NOTE — Assessment & Plan Note (Signed)
As above Her fatigue is relentless. I don't know whether it is cardiac, related to medications or hasn't altogether different source. She is euvolemic. For now we will hold her carvedilol and see what impact back tenderness.  We discussed her QRS duration of 138 milliseconds and the implications of a borderline QRS in terms of anticipated improvement if we were to be placed her left ventricular lead. I should note that in women there seems to be benefited in patients who've QRS is in the 130-150 range

## 2013-01-12 NOTE — Assessment & Plan Note (Signed)
As above.

## 2013-01-12 NOTE — Progress Notes (Signed)
      Patient Care Team: Latrelle Dodrill, MD as PCP - General (Pediatrics) Judene Companion, MD (Cardiology)   HPI  [name] is a 79 y.o. female Seen in followup for CHF with previous CRT with LV lead dislodgement confirmed by CXR  Efforts for afterload reduction complicated by hypotension  She underwent cath for the above and chest pain>>LV dysfunction mod 35-40% with Mlild MR  She continues to complain of significant fatigue Past Medical History  Diagnosis Date  . Nonischemic cardiomyopathy     Cath without coronary disease 2006  . Systolic CHF   . Hyperlipidemia   . Hypertension   . Osteoporosis   . Urge incontinence   . Raynaud phenomenon   . PAF (paroxysmal atrial fibrillation)     Short bursts of PAF. Asymptomatic.  Sees Dr Clarene Duke.  . Biventricular ICD (implantable cardioverter-defibrillator)-[employer] Jude     Date of implant 2007 generator change 2012  . Sinus tachycardia   . Macro dislodgment of the left ventricular lead 09/09/2012    Past Surgical History  Procedure Laterality Date  . Appendectomy    . Biventricular icd implant  06/28/2010    St Jude Implantable Defibrillator. Model No [id number]. Serial no [id number].  Placed by [facility]  . Bladder tack    . Cardiac catheterization  03/2005    EF 25%, Normal arteries, Nonischemic CM  . Abdominal hysterectomy  1970s  . Suprapubic urethrovesicular suspension  1992    Current Outpatient Prescriptions  Medication Sig Dispense Refill  . acetaminophen (TYLENOL) 325 MG tablet Take 650 mg by mouth 2 (two) times daily.      Marland Kitchen aspirin 81 MG EC tablet Take 81 mg by mouth once.       . carvedilol (COREG) 3.125 MG tablet Take 1 tablet (3.125 mg total) by mouth 2 (two) times daily with a meal.  60 tablet  11  . Cholecalciferol (VITAMIN D-3) 1000 UNITS CAPS Take 1 capsule by mouth daily.      . Cranberry (SM CRANBERRY) 300 MG tablet Take 600 mg by mouth 2 (two) times daily.       . digoxin (LANOXIN)  0.125 MG tablet Take 0.125 mg by mouth daily.      . furosemide (LASIX) 40 MG tablet Take 1 tablet by mouth once a day as needed breathlessness or swelling  30 tablet  4  . losartan (COZAAR) 25 MG tablet Take one tablet by mouth at bedtime  30 tablet  11   No current facility-administered medications for this visit.    Allergies  Allergen Reactions  . Sulfamethoxazole     REACTION: unspecified    Review of Systems negative except from HPI and PMH  Physical Exam BP 143/74  Pulse 86  Ht 5\' 4"  (1.626 m)  Wt 113 lb 9.6 oz (51.529 kg)  BMI 19.49 kg/m2 Well developed and well nourished in no acute distress HENT normal E scleral and icterus clear Neck Supple JVP flat; carotids brisk and full Clear to ausculation  Regular rate and rhythm, no murmurs gallops or rub Soft with active bowel sounds No clubbing cyanosis none Edema Alert and oriented, grossly normal motor and sensory function Skin Warm and Dry  ECG sinus at 87 Intervals 18/14/38 PVCs with a right bundle inferior axis morphology ECG from September demonstrated 25% PVCs  Assessment and  Plan

## 2013-01-12 NOTE — Assessment & Plan Note (Signed)
The patient has PVCs on ECG and ranging from 8-25%. They are right bundle inferior axis morphology suggesting an origin from the aorta mitral continuity. This burden could contribute cardiomyopathy. We will check a 48-hour Holter monitor quantitate PVCs. In the event that there greater than 10-15%, will undertake a trial of antiarrhythmic therapy probably mexiletine to see if we can eliminate them and whether there by his back in left ventricular function and effort intolerance/fatigue

## 2013-01-12 NOTE — Patient Instructions (Addendum)
Your physician has recommended you make the following change in your medication:  1) Stop Carvedilol for one month - we will re evaluate this when you come back for next office visit in a month.  Your physician has recommended that you wear a 48 hour holter monitor. Holter monitors are medical devices that record the heart's electrical activity. Doctors most often use these monitors to diagnose arrhythmias. Arrhythmias are problems with the speed or rhythm of the heartbeat. The monitor is a small, portable device. You can wear one while you do your normal daily activities. This is usually used to diagnose what is causing palpitations/syncope (passing out).   Your physician recommends that you schedule a follow-up appointment in: one month with Dr. Graciela Husbands.   Remote monitoring is used to monitor your  ICD from home. This monitoring reduces the number of office visits required to check your device to one time per year. It allows [place] to keep an eye on the functioning of your device to ensure it is working properly. You are scheduled for a device check from home on 04/14/2012. You may send your transmission at any time that day. If you have a wireless device, the transmission will be sent automatically. After your physician reviews your transmission, you will receive a postcard with your next transmission date.

## 2013-01-13 ENCOUNTER — Encounter: Payer: Self-pay | Admitting: Radiology

## 2013-01-13 ENCOUNTER — Encounter: Payer: Self-pay | Admitting: Home Health Services

## 2013-01-13 ENCOUNTER — Encounter (INDEPENDENT_AMBULATORY_CARE_PROVIDER_SITE_OTHER): Payer: Medicare HMO

## 2013-01-13 DIAGNOSIS — I4949 Other premature depolarization: Secondary | ICD-10-CM

## 2013-01-13 DIAGNOSIS — I493 Ventricular premature depolarization: Secondary | ICD-10-CM

## 2013-01-13 NOTE — Progress Notes (Signed)
Patient ID: [name], female   DOB: 1929/06/12, 79 y.o.   MRN: [id number] E cardio 48 Holter Monitor applied

## 2013-01-17 ENCOUNTER — Encounter: Payer: Self-pay | Admitting: Home Health Services

## 2013-01-17 NOTE — Progress Notes (Signed)
Patient ID: [name], female   DOB: 1929-08-30, 79 y.o.   MRN: [id number]  I have reviewed this visit and discussed with [name] and agree with her documentation.  Levert Feinstein, MD [facility]

## 2013-02-15 ENCOUNTER — Ambulatory Visit: Payer: Medicare HMO | Admitting: Internal Medicine

## 2013-02-17 ENCOUNTER — Ambulatory Visit (INDEPENDENT_AMBULATORY_CARE_PROVIDER_SITE_OTHER): Payer: Commercial Managed Care - HMO | Admitting: Internal Medicine

## 2013-02-17 ENCOUNTER — Encounter: Payer: Self-pay | Admitting: Internal Medicine

## 2013-02-17 VITALS — BP 161/84 | HR 107 | Ht 64.0 in | Wt 115.8 lb

## 2013-02-17 DIAGNOSIS — R000 Tachycardia, unspecified: Secondary | ICD-10-CM

## 2013-02-17 DIAGNOSIS — I4949 Other premature depolarization: Secondary | ICD-10-CM

## 2013-02-17 DIAGNOSIS — I493 Ventricular premature depolarization: Secondary | ICD-10-CM

## 2013-02-17 DIAGNOSIS — I428 Other cardiomyopathies: Secondary | ICD-10-CM

## 2013-02-17 DIAGNOSIS — Z95810 Presence of automatic (implantable) cardiac defibrillator: Secondary | ICD-10-CM

## 2013-02-17 DIAGNOSIS — I5022 Chronic systolic (congestive) heart failure: Secondary | ICD-10-CM

## 2013-02-17 DIAGNOSIS — I498 Other specified cardiac arrhythmias: Secondary | ICD-10-CM

## 2013-02-17 LAB — MDC_IDC_ENUM_SESS_TYPE_INCLINIC
Battery Remaining Longevity: 70.8 mo
Date Time Interrogation Session: 20141120171817
HighPow Impedance: 40.9453
Lead Channel Impedance Value: 350 Ohm
Lead Channel Impedance Value: 375 Ohm
Lead Channel Pacing Threshold Amplitude: 0.625 V
Lead Channel Pacing Threshold Pulse Width: 0.5 ms
Lead Channel Setting Pacing Amplitude: 1.875
Lead Channel Setting Pacing Amplitude: 2 V
Zone Setting Detection Interval: 300 ms

## 2013-02-17 NOTE — Assessment & Plan Note (Addendum)
He has class III heart failure. We have discussed revision of her device. She would like a downgraded pacemaker we can do this probably both visually despite using the old device has a 6 year longevity and  in activating high-voltage therapy  The other issue is whether she is patent left subclavian vein was she would need to have the will be extracted. I will review this with Dr. Reece Agar T or.

## 2013-02-17 NOTE — Assessment & Plan Note (Signed)
We'll not treat the 4% PVCs as you're not likely the cause of her cardiomyopathy

## 2013-02-17 NOTE — Patient Instructions (Addendum)
Your physician recommends that you continue on your current medications as directed. Please refer to the Current Medication list given to you today.  CRT or cardiac resynchronization therapy is a treatment used to correct heart failure. When you have heart failure your heart is weakened and doesn't pump as well as it should. This therapy may help reduce symptoms and improve the quality of life.  Please see the handout/brochure given to you today to get more information of the different options of therapy. (Will take ICD out, place CRT-P)  Remote monitoring is used to monitor your Pacemaker of ICD from home. This monitoring reduces the number of office visits required to check your device to one time per year. It allows [place] to keep an eye on the functioning of your device to ensure it is working properly. You are scheduled for a device check from home on 05/23/2013. You may send your transmission at any time that day. If you have a wireless device, the transmission will be sent automatically. After your physician reviews your transmission, you will receive a postcard with your next transmission date.  Your physician wants you to follow-up in: one year with Dr. Graciela Husbands.  You will receive a reminder letter in the mail two months in advance. If you don't receive a letter, please call our office to schedule the follow-up appointment.

## 2013-02-17 NOTE — Assessment & Plan Note (Signed)
As above.

## 2013-02-17 NOTE — Assessment & Plan Note (Signed)
I reviewed the above with Dr. Leonia Reeves. We will plan to undertake a venogram and see if the vein will allow insertion of a new CRT lead. In the event that it would not, it might actually be preferable to do it from the right side and and move lead to the left sided device.

## 2013-02-17 NOTE — Progress Notes (Signed)
      Patient Care Team: Latrelle Dodrill, MD as PCP - General (Pediatrics) Judene Companion, MD (Cardiology)   HPI  [name] is a 79 y.o. female  Seen in followup for CHF with previous CRT with LV lead dislodgement confirmed by CXR  Efforts for afterload reduction complicated by hypotension  She underwent cath for the above and chest pain>>LV dysfunction mod 35-40% with Mlild MR  She continues to complain of significant fatigue  Intercurrent Holter monitor demonstrated 4% PVCs. She continues to complain of her level is fatigue. Dyspnea on exertion is moderate.  Past Medical History  Diagnosis Date  . Nonischemic cardiomyopathy     Cath without coronary disease 2006  . Systolic CHF   . Hyperlipidemia   . Hypertension   . Osteoporosis   . Urge incontinence   . Raynaud phenomenon   . PAF (paroxysmal atrial fibrillation)     Short bursts of PAF. Asymptomatic.  Sees Dr Clarene Duke.  . Biventricular ICD (implantable cardioverter-defibrillator)-St. Jude     Date of implant 2007 generator change 2012  . Sinus tachycardia   . Macro dislodgment of the left ventricular lead 09/09/2012    Past Surgical History  Procedure Laterality Date  . Appendectomy    . Biventricular icd implant  06/28/2010    St Jude Implantable Defibrillator. Model No [id number]. Serial no [id number].  Placed by [facility]  . Bladder tack    . Cardiac catheterization  03/2005    EF 25%, Normal arteries, Nonischemic CM  . Abdominal hysterectomy  1970s  . Suprapubic urethrovesicular suspension  1992    Current Outpatient Prescriptions  Medication Sig Dispense Refill  . aspirin 81 MG EC tablet Take 81 mg by mouth once.       . Cholecalciferol (VITAMIN D-3) 1000 UNITS CAPS Take 1 capsule by mouth daily.      . Cranberry (SM CRANBERRY) 300 MG tablet Take 600 mg by mouth 2 (two) times daily.       . digoxin (LANOXIN) 0.125 MG tablet Take 0.125 mg by mouth daily.      . furosemide (LASIX) 40  MG tablet Take 1 tablet by mouth once a day as needed breathlessness or swelling  30 tablet  4  . losartan (COZAAR) 25 MG tablet Take one tablet by mouth at bedtime  30 tablet  11   No current facility-administered medications for this visit.    Allergies  Allergen Reactions  . Sulfamethoxazole     REACTION: unspecified    Review of Systems negative except from HPI and PMH  Physical Exam BP 161/84  Pulse 107  Ht 5\' 4"  (1.626 m)  Wt 115 lb 12.8 oz (52.527 kg)  BMI 19.87 kg/m2 Well developed and well nourished in no acute distress HENT normal E scleral and icterus clear Neck Supple 8-9  carotids brisk and full Clear to ausculation  Regular rate and rhythm, no murmurs gallops or rub Soft with active bowel sounds No clubbing cyanosis none Edema Alert and oriented, grossly normal motor and sensory function Skin Warm and Dry Device pocket well healed; without hematoma or erythema.  There is no tethering there is significant caudal migration    Assessment and  Plan

## 2013-02-23 ENCOUNTER — Other Ambulatory Visit: Payer: Self-pay | Admitting: *Deleted

## 2013-02-23 ENCOUNTER — Encounter: Payer: Self-pay | Admitting: *Deleted

## 2013-02-23 ENCOUNTER — Telehealth: Payer: Self-pay | Admitting: *Deleted

## 2013-02-23 DIAGNOSIS — I5022 Chronic systolic (congestive) heart failure: Secondary | ICD-10-CM

## 2013-02-23 NOTE — Telephone Encounter (Signed)
Called patient to schedule downgrade to CRT-P for 03/28/2013. Lab work scheduled for 03/21/2013. Discussed procedure instructions, mailed letter to home. Patient verbalized understanding and agreeable to plan.

## 2013-03-01 ENCOUNTER — Other Ambulatory Visit: Payer: Self-pay | Admitting: *Deleted

## 2013-03-01 DIAGNOSIS — I8290 Acute embolism and thrombosis of unspecified vein: Secondary | ICD-10-CM

## 2013-03-02 ENCOUNTER — Other Ambulatory Visit: Payer: Self-pay | Admitting: *Deleted

## 2013-03-15 ENCOUNTER — Encounter (HOSPITAL_COMMUNITY): Payer: Self-pay | Admitting: Respiratory Therapy

## 2013-03-21 ENCOUNTER — Other Ambulatory Visit (INDEPENDENT_AMBULATORY_CARE_PROVIDER_SITE_OTHER): Payer: Medicare HMO

## 2013-03-21 DIAGNOSIS — I5022 Chronic systolic (congestive) heart failure: Secondary | ICD-10-CM

## 2013-03-21 LAB — BASIC METABOLIC PANEL
CO2: 30 mEq/L (ref 19–32)
Calcium: 9.4 mg/dL (ref 8.4–10.5)
Chloride: 100 mEq/L (ref 96–112)
Glucose, Bld: 103 mg/dL — ABNORMAL HIGH (ref 70–99)
Potassium: 4.3 mEq/L (ref 3.5–5.1)
Sodium: 138 mEq/L (ref 135–145)

## 2013-03-21 LAB — CBC WITH DIFFERENTIAL/PLATELET
Basophils Absolute: 0 10*3/uL (ref 0.0–0.1)
Basophils Relative: 0.4 % (ref 0.0–3.0)
Eosinophils Absolute: 0.1 10*3/uL (ref 0.0–0.7)
HCT: 42.5 % (ref 36.0–46.0)
Hemoglobin: 14.2 g/dL (ref 12.0–15.0)
Lymphs Abs: 2.1 10*3/uL (ref 0.7–4.0)
MCHC: 33.5 g/dL (ref 30.0–36.0)
MCV: 90.9 fl (ref 78.0–100.0)
Monocytes Absolute: 1.1 10*3/uL — ABNORMAL HIGH (ref 0.1–1.0)
Neutro Abs: 4.6 10*3/uL (ref 1.4–7.7)
RBC: 4.67 Mil/uL (ref 3.87–5.11)
RDW: 14.8 % — ABNORMAL HIGH (ref 11.5–14.6)

## 2013-03-28 ENCOUNTER — Ambulatory Visit (HOSPITAL_COMMUNITY)
Admission: RE | Admit: 2013-03-28 | Discharge: 2013-03-29 | Disposition: A | Discharge status: Home / Self Care | Payer: Medicare HMO | Source: Ambulatory Visit | Attending: Internal Medicine | Admitting: Internal Medicine

## 2013-03-28 ENCOUNTER — Encounter (HOSPITAL_COMMUNITY)
Admission: RE | Disposition: A | Discharge status: Home / Self Care | Payer: Self-pay | Source: Ambulatory Visit | Attending: Internal Medicine

## 2013-03-28 DIAGNOSIS — I4460 Unspecified fascicular block: Secondary | ICD-10-CM

## 2013-03-28 DIAGNOSIS — I10 Essential (primary) hypertension: Secondary | ICD-10-CM | POA: Insufficient documentation

## 2013-03-28 DIAGNOSIS — I5022 Chronic systolic (congestive) heart failure: Secondary | ICD-10-CM

## 2013-03-28 DIAGNOSIS — Y831 Surgical operation with implant of artificial internal device as the cause of abnormal reaction of the patient, or of later complication, without mention of misadventure at the time of the procedure: Secondary | ICD-10-CM | POA: Insufficient documentation

## 2013-03-28 DIAGNOSIS — T82198A Other mechanical complication of other cardiac electronic device, initial encounter: Secondary | ICD-10-CM | POA: Insufficient documentation

## 2013-03-28 DIAGNOSIS — I4891 Unspecified atrial fibrillation: Secondary | ICD-10-CM | POA: Insufficient documentation

## 2013-03-28 DIAGNOSIS — I447 Left bundle-branch block, unspecified: Secondary | ICD-10-CM | POA: Insufficient documentation

## 2013-03-28 DIAGNOSIS — M810 Age-related osteoporosis without current pathological fracture: Secondary | ICD-10-CM | POA: Insufficient documentation

## 2013-03-28 DIAGNOSIS — I7300 Raynaud's syndrome without gangrene: Secondary | ICD-10-CM | POA: Insufficient documentation

## 2013-03-28 DIAGNOSIS — Z90710 Acquired absence of both cervix and uterus: Secondary | ICD-10-CM | POA: Insufficient documentation

## 2013-03-28 DIAGNOSIS — Z9089 Acquired absence of other organs: Secondary | ICD-10-CM | POA: Insufficient documentation

## 2013-03-28 DIAGNOSIS — I428 Other cardiomyopathies: Secondary | ICD-10-CM | POA: Insufficient documentation

## 2013-03-28 DIAGNOSIS — I509 Heart failure, unspecified: Secondary | ICD-10-CM | POA: Insufficient documentation

## 2013-03-28 DIAGNOSIS — E785 Hyperlipidemia, unspecified: Secondary | ICD-10-CM | POA: Insufficient documentation

## 2013-03-28 HISTORY — PX: BI-VENTRICULAR PACEMAKER INSERTION: SHX5462

## 2013-03-28 SURGERY — BI-VENTRICULAR PACEMAKER INSERTION (CRT-P)
Anesthesia: LOCAL

## 2013-03-28 MED ORDER — CHLORHEXIDINE GLUCONATE 4 % EX LIQD: 60.0000 mL | Freq: Once | CUTANEOUS | Status: DC

## 2013-03-28 MED ORDER — MUPIROCIN 2 % EX OINT
TOPICAL_OINTMENT | CUTANEOUS | Status: AC
  Filled 2013-03-28: qty 22

## 2013-03-28 MED ORDER — DIGOXIN 125 MCG PO TABS
0.1250 mg | ORAL_TABLET | Freq: Every day | ORAL | Status: DC
  Filled 2013-03-28 (×2): qty 1

## 2013-03-28 MED ORDER — CEFAZOLIN SODIUM 1-5 GM-% IV SOLN
1.0000 g | Freq: Four times a day (QID) | INTRAVENOUS | Status: AC
  Administered 2013-03-28 – 2013-03-29 (×3): 1 g via INTRAVENOUS
  Filled 2013-03-28 (×3): qty 50

## 2013-03-28 MED ORDER — MUPIROCIN 2 % EX OINT
TOPICAL_OINTMENT | Freq: Two times a day (BID) | CUTANEOUS | Status: DC
  Filled 2013-03-28: qty 22

## 2013-03-28 MED ORDER — SODIUM CHLORIDE 0.9 % IV SOLN: INTRAVENOUS | Status: DC

## 2013-03-28 MED ORDER — FENTANYL CITRATE 0.05 MG/ML IJ SOLN
INTRAMUSCULAR | Status: AC
  Filled 2013-03-28: qty 2

## 2013-03-28 MED ORDER — SODIUM CHLORIDE 0.9 % IR SOLN
80.0000 mg | Status: DC
  Filled 2013-03-28: qty 2

## 2013-03-28 MED ORDER — SODIUM CHLORIDE 0.9 % IV SOLN
INTRAVENOUS | Status: AC
  Administered 2013-03-28: 17:00:00 via INTRAVENOUS

## 2013-03-28 MED ORDER — FUROSEMIDE 40 MG PO TABS
40.0000 mg | ORAL_TABLET | Freq: Every day | ORAL | Status: DC | PRN
  Filled 2013-03-28: qty 1

## 2013-03-28 MED ORDER — LOSARTAN POTASSIUM 25 MG PO TABS
25.0000 mg | ORAL_TABLET | Freq: Every day | ORAL | Status: DC
  Filled 2013-03-28 (×2): qty 1

## 2013-03-28 MED ORDER — CEFAZOLIN SODIUM-DEXTROSE 2-3 GM-% IV SOLR
2.0000 g | INTRAVENOUS | Status: DC
  Filled 2013-03-28: qty 50

## 2013-03-28 MED ORDER — ACETAMINOPHEN 325 MG PO TABS: 325.0000 mg | ORAL_TABLET | ORAL | Status: DC | PRN

## 2013-03-28 MED ORDER — ONDANSETRON HCL 4 MG/2ML IJ SOLN: 4.0000 mg | Freq: Four times a day (QID) | INTRAMUSCULAR | Status: DC | PRN

## 2013-03-28 MED ORDER — MIDAZOLAM HCL 5 MG/5ML IJ SOLN
INTRAMUSCULAR | Status: AC
  Filled 2013-03-28: qty 5

## 2013-03-28 MED ORDER — LIDOCAINE HCL (PF) 1 % IJ SOLN
INTRAMUSCULAR | Status: AC
  Filled 2013-03-28: qty 90

## 2013-03-28 NOTE — H&P (Signed)
Patient Care Team: Latrelle Dodrill, MD as PCP - General (Pediatrics) Judene Companion, MD (Cardiology)   HPI  [name] is a 79 y.o. female Here for CRT upgrade LV lead revision.  She has hx  CHF 2/2 nonischemic cardiomyopathy with previous CRT with LV lead dislodgement confirmed by CXR  Efforts for afterload reduction complicated by hypotension  She underwent cath for the above and chest pain>>LV dysfunction mod 35-40% with Mlild MR  She continues to complain of significant fatigue and DOE with intermittent edema   Otherwise stable   Past Medical History  Diagnosis Date  . Nonischemic cardiomyopathy     Cath without coronary disease 2006  . Systolic CHF   . Hyperlipidemia   . Hypertension   . Osteoporosis   . Urge incontinence   . Raynaud phenomenon   . PAF (paroxysmal atrial fibrillation)     Short bursts of PAF. Asymptomatic.  Sees Dr Clarene Duke.  . Biventricular ICD (implantable cardioverter-defibrillator)-St. Jude     Date of implant 2007 generator change 2012  . Sinus tachycardia   . Macro dislodgment of the left ventricular lead 09/09/2012    Past Surgical History  Procedure Laterality Date  . Appendectomy    . Biventricular icd implant  06/28/2010    St Jude Implantable Defibrillator. Model No [id number]. Serial no [id number].  Placed by [facility]  . Bladder tack    . Cardiac catheterization  03/2005    EF 25%, Normal arteries, Nonischemic CM  . Abdominal hysterectomy  1970s  . Suprapubic urethrovesicular suspension  1992    Current Facility-Administered Medications  Medication Dose Route Frequency Provider Last Rate Last Dose  . 0.9 %  sodium chloride infusion   Intravenous Continuous Brooke O Edmisten, PA-C      . 0.9 %  sodium chloride infusion   Intravenous Continuous Brooke O Edmisten, PA-C      . ceFAZolin (ANCEF) IVPB 2 g/50 mL premix  2 g Intravenous On Call [employer], PA-C      . chlorhexidine (HIBICLENS) 4 %  liquid 4 application  60 mL Topical Once [employer], PA-C      . gentamicin (GARAMYCIN) 80 mg in sodium chloride irrigation 0.9 % 500 mL irrigation  80 mg Irrigation On Call [employer], PA-C      . mupirocin ointment (BACTROBAN) 2 %   Nasal BID Duke Salvia, MD      . mupirocin ointment (BACTROBAN) 2 %             Allergies  Allergen Reactions  . Sulfamethoxazole     REACTION: unspecified    Review of Systems negative except from HPI and PMH  Physical Exam BP 135/68  Pulse 91  Temp(Src) 97.6 F (36.4 C) (Oral)  Resp 18  Ht 5\' 4"  (1.626 m)  Wt 115 lb (52.164 kg)  BMI 19.73 kg/m2  SpO2 100% Well developed and well nourished in no acute distress HENT normal E scleral and icterus clear Neck Supple JVP 6-7 carotids brisk and full Clear to ausculation Some veins on chest maybe L>>R  Regular rate and rhythm, no murmurs gallops or rub Soft with active bowel sounds No clubbing cyanosis no Edema Alert and oriented, grossly normal motor and sensory function Skin Warm and Dry  ECG  NSR with LBBB and LAD  Assessment and  Plan  NICM  CHF  LBBB  Prev CRT-D with dislodged LV lead  Plan will be to attempt placement of LV lead either from the Right or the Left side with downgrade ICD>>CRT with repositioning of the pacemaker We have reviewed the benefits and risks of generator replacement.  These include but are not limited to lead fracture and infection.  The patient understands, agrees and is willing to proceed.

## 2013-03-28 NOTE — CV Procedure (Signed)
[name] [id number]  [id number]  Preop Dx: NICM CHF prev ICD, LV macrolead dislodgment Postop Dx same/ patent LSC vein  Procedure:venogram; ICD explant, LV lead insertion  CRT pacer insertion  Cx: None   Dictation number [id number]  Sherryl Manges, MD 03/28/2013 2:41 PM

## 2013-03-28 NOTE — Progress Notes (Signed)
Dig and losartin not given to patient this PM secondary to patient taking own meds from home but could not say what meds she had taken.

## 2013-03-29 ENCOUNTER — Encounter (HOSPITAL_COMMUNITY): Payer: Self-pay | Admitting: *Deleted

## 2013-03-29 ENCOUNTER — Ambulatory Visit (HOSPITAL_COMMUNITY): Payer: Medicare HMO

## 2013-03-29 DIAGNOSIS — I4460 Unspecified fascicular block: Secondary | ICD-10-CM

## 2013-03-29 MED ORDER — TRAZODONE 25 MG HALF TABLET
25.0000 mg | ORAL_TABLET | Freq: Every day | ORAL | Status: DC
  Administered 2013-03-29: 01:00:00 25 mg via ORAL
  Filled 2013-03-29 (×2): qty 1

## 2013-03-29 MED ORDER — YOU HAVE A PACEMAKER BOOK
Freq: Once | Status: AC
  Administered 2013-03-29: 06:00:00
  Filled 2013-03-29: qty 1

## 2013-03-29 NOTE — Discharge Summary (Signed)
ELECTROPHYSIOLOGY DISCHARGE SUMMARY    Patient ID: [name],  MRN: [id number], DOB/AGE: 1929/12/05 79 y.o.  Admit date: 03/28/2013 Discharge date: 03/29/2013  Primary Care Physician: Levert Feinstein, MD Primary Cardiologist: Berton Mount, MD  Primary Discharge Diagnosis:  1. NICM, EF 35%, s/p downgrade to BiV PPM with LV lead revision 2. LV lead macrodislodgement  Secondary Discharge Diagnoses:  1. Chronic systolic HF 2. PAF 3. HTN 4. Dyslipidemia 5. Raynaud phenomenon  Procedures This Admission:  1. Venogram; ICD explant: LV lead insertion; CRT pacer insertion LV lead - St Jude 1450 Q lead, serial [id number] Device - St Jude pulse generator model [id number], serial [id number]  History and Hospital Course:  [name] is a pleasant 79 year old woman with a NICM, EF 35%, s/p CRT-D implanted previously who had LV lead dislodgement confirmed by CXR. She continued to have significant fatigue and DOE. She presented yesterday for downgrade to CRT-P and LV lead revision. She tolerated this procedure well without any immediate complication. She remains hemodynamically stable and afebrile. Her chest xray shows stable lead placement without pneumothorax. Her device interrogation shows normal BiV PPM function with stable lead parameters/measurements. Her implant site is intact without significant bleeding or hematoma. She has been given discharge instructions including wound care and activity restrictions. She will follow-up in 10 days for wound check. There were no changes made to her medications. Of note, her Coreg has been held since 01/12/2013 due to fatigue. She has been seen, examined and deemed stable for discharge today by Dr. Berton Mount.  Physical Exam: Vitals: Blood pressure 123/68, pulse 84, temperature 98.1 F (36.7 C), temperature source Oral, resp. rate 18, height 5\' 4"  (1.626 m), weight 115 lb 11.9 oz (52.5 kg), SpO2 96.00%.  General: Well developed, well appearing 79 year  old female in no acute distress. Neck: Supple. JVD not elevated. Lungs: Clear bilaterally to auscultation without wheezes, rales, or rhonchi. Breathing is unlabored. Heart: RRR S1 S2 without murmurs, rubs, or gallops.  Abdomen: Soft, non-distended. Extremities: No clubbing or cyanosis. No edema. Distal pedal pulses are 2+ and equal bilaterally. Neuro: Alert and oriented X 3. Moves all extremities spontaneously. No focal deficits. Skin: Left upper chest / implant site intact without bleeding or hematoma.  Labs: Lab Results  Component Value Date   WBC 8.0 03/21/2013   HGB 14.2 03/21/2013   HCT 42.5 03/21/2013   MCV 90.9 03/21/2013   PLT 131.0* 03/21/2013      Component Value Date/Time   NA 138 03/21/2013 0758   K 4.3 03/21/2013 0758   CL 100 03/21/2013 0758   CO2 30 03/21/2013 0758   GLUCOSE 103* 03/21/2013 0758   BUN 15 03/21/2013 0758   CREATININE 0.7 03/21/2013 0758   CREATININE 0.70 04/11/2011 0945   CALCIUM 9.4 03/21/2013 0758   GFRNONAA 46* 05/05/2009 1300   GFRAA  Value: 55        The eGFR has been calculated using the MDRD equation. This calculation has not been validated in all clinical situations. eGFR's persistently <60 mL/min signify possible Chronic Kidney Disease.* 05/05/2009 1300    Disposition:  The patient is being discharged in stable condition.  Follow-up:     Follow-up Information   Follow up with [facility] On 04/07/2013. (At 10:30 AM for wound check)    Specialty:  Cardiology   Contact information:   [address] [place] [address] [phone]     Discharge Medications:    Medication  List         acetaminophen 650 MG CR tablet  Commonly known as:  TYLENOL  Take 1,300 mg by mouth daily.     aspirin 81 MG EC tablet  Take 81 mg by mouth once.     digoxin 0.125 MG tablet  Commonly known as:  LANOXIN  Take 0.125 mg by mouth daily.     furosemide 40 MG tablet  Commonly known as:  LASIX  Take 40 mg by mouth  daily as needed for fluid.     losartan 25 MG tablet  Commonly known as:  COZAAR  Take 25 mg by mouth daily.     SM CRANBERRY 300 MG tablet  Generic drug:  Cranberry  Take 600 mg by mouth 2 (two) times daily.     Vitamin D-3 1000 UNITS Caps  Take 1 capsule by mouth daily.       Duration of Discharge Encounter: Greater than 30 minutes including physician time.  Signed, Rick Duff, PA-C 03/29/2013, 8:22 AM   Discharge to home  reasume coreg 3.125 bid

## 2013-03-29 NOTE — Op Note (Signed)
NAMEKhyleigh, [name]                  ACCOUNT NO.:  [id number]  MEDICAL RECORD NO.:  [id number]  LOCATION:  6C03C                        FACILITY:  [facility]  PHYSICIAN:  Duke Salvia, MD, FACCDATE OF BIRTH:  1929/08/09  DATE OF PROCEDURE:  03/28/2013 DATE OF DISCHARGE:                              OPERATIVE REPORT   PREOPERATIVE DIAGNOSES:  Previously implanted ICD-CRT with macro dislodgement of the LV lead, ongoing congestive heart failure with left bundle-branch block.  POSTOPERATIVE DIAGNOSES:  Previously implanted ICD-CRT with macro dislodgement of the LV lead, ongoing congestive heart failure with left bundle-branch block; patent left subclavian vein.  PROCEDURES:  Contrast venogram, explantation of a previously implanted device, insertion of an LV lead, insertion of a CRT pacemaker.  Following obtaining informed consent, the patient was brought to the Electrophysiology Laboratory and placed on the fluoroscopic table in a supine position.  Contrast venography demonstrated stenosis but patency of extrathoracic left subclavian vein.  After routine prep and drape, the patient was then anesthetized in the prepectoral subclavicular region.  An 1 cm incision was made and carried down to layer of the prepectoral fascia using sharp dissection.  Access was obtained surprisingly easily with a micropuncture kit, a Wholey wire was placed. The subclavian vein was dilated and a 9.5-French sheath was placed which was passed a [employer] Jude 135 cm coronary sinus cannulation catheter. Interestingly, we were able to cannulate the coronary sinus balloon.  I tried to deploy the sheath into the vein, it would not go.  We used a double wire technique and this also failed to give sufficient support. We then noticed that there was a posterolateral branch coursing along the lateral surface, which we then cannulated and into which we placed a [facility] 1450 Q lead, serial number [id number].  The  distal ramification was at the apex but the 3rd and 4th pulse where in the mid section between base and apex.  In these locations, the RV-LV interval was greater than 125 milliseconds.  This lead was deployed with removal of the coronary sinus access sheath and secured.  An incision was then made about an inch and a half caudal to this, which allowed for explantation of the previously implanted ICD that had migrated caudally about 5 inches.  Interrogation of the previously implanted 70-20 rate sense portion demonstrated a R-wave of 6.1 with a pace impedance of 365, a threshold 0.7 at 0.4.  The high-voltage pins were cut and the insulation oversewn.  The LV lead was capped and the right atrial lead was interrogated with a P-wave of 3.8 with a pace impedance of 376 and threshold 0.7 at 0.4.  I should note that the previously implanted leads were implanted 7/07.  The leads in the pulse generator.  The leads were then attached to a [facility], although required RF pulse generator model [id number], serial [id number].  Significant narrowing of the QRS was noted with LV pacing.  The pocket was copiously irrigated with antibiotic containing saline solution.  I should note that the cephalad 1 cm incision was closed 1st. The leads in the pulse generator were then placed in the pocket with care that the capped leads  were posterior to the retained lead.  The LV lead was placed and the device was secured to the prepectoral fascia and the 2nd incision was closed in 3 layers also.  The wound was washed, dried, and a Dermabond dressing was applied.  Needle counts, sponge counts, and instrument counts were correct at the end of procedure according to the staff.  The patient tolerated the procedure without apparent complications.     Duke Salvia, MD, [facility]     SCK/MEDQ  D:  03/28/2013  T:  03/29/2013  Job:  [id number]

## 2013-04-01 ENCOUNTER — Encounter: Payer: Self-pay | Admitting: Cardiovascular Disease

## 2013-04-07 ENCOUNTER — Other Ambulatory Visit: Payer: Self-pay | Admitting: *Deleted

## 2013-04-07 ENCOUNTER — Ambulatory Visit (INDEPENDENT_AMBULATORY_CARE_PROVIDER_SITE_OTHER): Payer: Medicare HMO | Admitting: *Deleted

## 2013-04-07 DIAGNOSIS — I5022 Chronic systolic (congestive) heart failure: Secondary | ICD-10-CM

## 2013-04-07 DIAGNOSIS — R000 Tachycardia, unspecified: Secondary | ICD-10-CM

## 2013-04-07 DIAGNOSIS — I428 Other cardiomyopathies: Secondary | ICD-10-CM

## 2013-04-07 DIAGNOSIS — I498 Other specified cardiac arrhythmias: Secondary | ICD-10-CM

## 2013-04-07 LAB — MDC_IDC_ENUM_SESS_TYPE_INCLINIC
Battery Remaining Longevity: 57.6 mo
Brady Statistic RV Percent Paced: 93 %
Implantable Pulse Generator Model: 3242
Implantable Pulse Generator Serial Number: 7554613
Lead Channel Impedance Value: 337.5 Ohm
Lead Channel Pacing Threshold Amplitude: 0.75 V
Lead Channel Pacing Threshold Amplitude: 0.75 V
Lead Channel Pacing Threshold Amplitude: 0.75 V
Lead Channel Pacing Threshold Amplitude: 0.75 V
Lead Channel Pacing Threshold Amplitude: 2.25 V
Lead Channel Pacing Threshold Pulse Width: 0.4 ms
Lead Channel Pacing Threshold Pulse Width: 0.4 ms
Lead Channel Pacing Threshold Pulse Width: 0.4 ms
Lead Channel Pacing Threshold Pulse Width: 0.7 ms
Lead Channel Sensing Intrinsic Amplitude: 7.3 mV
Lead Channel Setting Pacing Amplitude: 2 V
Lead Channel Setting Pacing Amplitude: 2 V
Lead Channel Setting Pacing Amplitude: 3.25 V
Lead Channel Setting Pacing Pulse Width: 0.4 ms
Lead Channel Setting Pacing Pulse Width: 0.7 ms
MDC IDC MSMT BATTERY VOLTAGE: 2.99 V
MDC IDC MSMT LEADCHNL LV IMPEDANCE VALUE: 450 Ohm
MDC IDC MSMT LEADCHNL RA SENSING INTR AMPL: 3.7 mV
MDC IDC MSMT LEADCHNL RV IMPEDANCE VALUE: 400 Ohm
MDC IDC MSMT LEADCHNL RV PACING THRESHOLD PULSEWIDTH: 0.4 ms
MDC IDC SESS DTM: 20150108110331
MDC IDC SET LEADCHNL RV SENSING SENSITIVITY: 2 mV
MDC IDC STAT BRADY RA PERCENT PACED: 0.05 %

## 2013-04-07 MED ORDER — DIGOXIN 125 MCG PO TABS: 0.1250 mg | ORAL_TABLET | Freq: Every day | ORAL | Status: DC

## 2013-04-07 MED ORDER — LOSARTAN POTASSIUM 25 MG PO TABS: 25.0000 mg | ORAL_TABLET | Freq: Every day | ORAL | Status: DC

## 2013-04-07 NOTE — Progress Notes (Signed)
Wound check appointment. No steri-strips, surgical glue present. Wound without redness or edema. Incision edges approximated, wound well healed. Normal device function. Thresholds, sensing, and impedances consistent with implant measurements. RA & RV leads at chronic settings, LV lead auto cap on. Histogram distribution appropriate for patient and level of activity. 1 mode switch---<1%. No high ventricular rates noted. Patient educated about wound care, arm mobility, lifting restrictions.   Digoxin & losartan refilled today.  ROV w/ SK in 66mo.

## 2013-04-21 ENCOUNTER — Ambulatory Visit (INDEPENDENT_AMBULATORY_CARE_PROVIDER_SITE_OTHER): Payer: Medicare HMO | Admitting: Family Medicine

## 2013-04-21 ENCOUNTER — Encounter: Payer: Self-pay | Admitting: Family Medicine

## 2013-04-21 VITALS — BP 168/83 | HR 76 | Temp 97.9°F | Ht 64.0 in | Wt 113.0 lb

## 2013-04-21 DIAGNOSIS — D485 Neoplasm of uncertain behavior of skin: Secondary | ICD-10-CM

## 2013-04-21 NOTE — Progress Notes (Signed)
Patient ID: [name], female   DOB: [birth year], 80 y.o.   MRN: [id number]  HPI:  Growth on face: has been present for about one year. This is the first time she's sough care for it. It has not drained any fluid but sometimes has a little bit of blood if she scratches it and it gets irritated. It is present on the lateral R aspect of her nasal bridge, almost within the orbit. It is right where her glasses sit and it bothers here. She has had a growth removed on her face before but it was benign. She would like this one removed from her face.  ROS: See HPI  [place]: Hx HTN, chronic systolic CHF, nonischemic cardiomyopathy, pacemaker in place, all managed by cardiology  PHYSICAL EXAM: BP 168/83  Pulse 76  Temp(Src) 97.9 F (36.6 C) (Oral)  Ht 5\' 4"  (1.626 m)  Wt 113 lb (51.256 kg)  BMI 19.39 kg/m2 Gen: NAD HEENT: ~1cm mildly fungating gray lesion present on medial aspect of R orbit. Appearance consistent with possible seborrheic keratosis vs actinic keratosis. No drainage or surrounding erythema. 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 Certainly needs biopsy to ensure it is not malignant. From chart review, it appears pt has been referred for a lesion in this same place in the past, approximately 2 years ago. Will refer to dermatology for removal. Precepted with Dr. Lindell Noe who also examined patient and agrees with this plan.   FOLLOW UP: Referring to dermatology for removal of facial neoplasm.   [name]. [name], [place]

## 2013-04-21 NOTE — Patient Instructions (Signed)
It was great to see you again today!  I am referring you to dermatology for the growth on your face. You will get a phone call to schedule this appointment. Call us if you haven't heard from a dermatologist within 2 weeks.  Be well, Dr. [name]

## 2013-04-22 ENCOUNTER — Telehealth: Payer: Self-pay | Admitting: Family Medicine

## 2013-04-22 NOTE — Telephone Encounter (Signed)
Patient called and said that on her check out sheet yesterday she noticed that we still have Little as her cardiologist.  She says that this is incorrect and she wants that to be changed to say Dr. Caryl Comes at [facility].  She says she has tried to get this corrected before and that it still has not been done.  Thanks.

## 2013-04-22 NOTE — Telephone Encounter (Signed)
Called pt.informed. [name], Gerrit Heck

## 2013-04-22 NOTE — Telephone Encounter (Signed)
Will fwd. To PCP for change. Thanks. [name], [name]

## 2013-04-22 NOTE — Telephone Encounter (Signed)
I fixed this on her "care teams" tab. Please inform pt that this has been corrected. Thanks!  [name], MD

## 2013-04-28 ENCOUNTER — Telehealth: Payer: Self-pay | Admitting: Family Medicine

## 2013-04-28 NOTE — Telephone Encounter (Signed)
According to pt, we have to get a letter from her insurance before she can see the dermatologist Please advise

## 2013-04-29 NOTE — Telephone Encounter (Signed)
Noted.  Will fwd to MD as Juluis Rainier.  Ivadell Gaul, Loralyn [place], [place]

## 2013-04-29 NOTE — Telephone Encounter (Signed)
Spoke with pt and explain silverback form was already faxed and I called [facility] and LVM telling them that [place] form was faxed.  [place]

## 2013-05-02 ENCOUNTER — Encounter: Payer: Self-pay | Admitting: Internal Medicine

## 2013-05-09 ENCOUNTER — Telehealth: Payer: Self-pay | Admitting: Family Medicine

## 2013-05-09 NOTE — Telephone Encounter (Signed)
[place] red team and [employer], Never mind - I just got a copy of the consultation note today. Apparently pt already has been seen by derm :)  [place]

## 2013-05-09 NOTE — Telephone Encounter (Signed)
[employer], Is this what should happen next with this referral?   Lazaro Arms, CMA

## 2013-05-09 NOTE — Telephone Encounter (Signed)
Received notification from pt's insurance company that referral to dermatology was approved. Not sure of the status of her referral, it appears from the referral messages that pt needs to call and schedule the appt herself at [facility] (Dr. Marvel Plan).   [place] red team, can you call pt and tell her she should now be able to schedule the appointment?  [name], MD

## 2013-07-14 ENCOUNTER — Encounter: Payer: Self-pay | Admitting: *Deleted

## 2013-07-18 ENCOUNTER — Encounter: Payer: Self-pay | Admitting: Internal Medicine

## 2013-07-18 ENCOUNTER — Telehealth: Payer: Self-pay | Admitting: Internal Medicine

## 2013-07-18 NOTE — Telephone Encounter (Signed)
New message     Got letter that we did not get her remote transmission.  Pt want to talk to device clinic.

## 2013-07-18 NOTE — Telephone Encounter (Signed)
Remote received 06/08/13 and patient scheduled for office visit with Dr. Caryl Comes in June.

## 2013-09-18 ENCOUNTER — Encounter: Payer: Self-pay | Admitting: Internal Medicine

## 2013-09-19 ENCOUNTER — Encounter: Payer: Self-pay | Admitting: Internal Medicine

## 2013-09-19 ENCOUNTER — Ambulatory Visit (INDEPENDENT_AMBULATORY_CARE_PROVIDER_SITE_OTHER): Payer: Commercial Managed Care - HMO | Admitting: Internal Medicine

## 2013-09-19 VITALS — BP 171/83 | HR 88 | Ht 64.5 in | Wt 110.0 lb

## 2013-09-19 DIAGNOSIS — Z95810 Presence of automatic (implantable) cardiac defibrillator: Secondary | ICD-10-CM

## 2013-09-19 DIAGNOSIS — I4949 Other premature depolarization: Secondary | ICD-10-CM

## 2013-09-19 DIAGNOSIS — I493 Ventricular premature depolarization: Secondary | ICD-10-CM

## 2013-09-19 DIAGNOSIS — I498 Other specified cardiac arrhythmias: Secondary | ICD-10-CM

## 2013-09-19 DIAGNOSIS — I5022 Chronic systolic (congestive) heart failure: Secondary | ICD-10-CM

## 2013-09-19 DIAGNOSIS — I428 Other cardiomyopathies: Secondary | ICD-10-CM

## 2013-09-19 DIAGNOSIS — R000 Tachycardia, unspecified: Secondary | ICD-10-CM

## 2013-09-19 LAB — MDC_IDC_ENUM_SESS_TYPE_INCLINIC
Brady Statistic RV Percent Paced: 97 %
Date Time Interrogation Session: 20150622142118
Implantable Pulse Generator Model: 3242
Implantable Pulse Generator Serial Number: 7554613
Lead Channel Impedance Value: 362.5 Ohm
Lead Channel Impedance Value: 512.5 Ohm
Lead Channel Pacing Threshold Amplitude: 0.75 V
Lead Channel Pacing Threshold Amplitude: 0.75 V
Lead Channel Pacing Threshold Amplitude: 0.75 V
Lead Channel Pacing Threshold Amplitude: 0.75 V
Lead Channel Pacing Threshold Amplitude: 1.5 V
Lead Channel Pacing Threshold Pulse Width: 0.4 ms
Lead Channel Pacing Threshold Pulse Width: 0.4 ms
Lead Channel Pacing Threshold Pulse Width: 0.7 ms
Lead Channel Sensing Intrinsic Amplitude: 4.5 mV
Lead Channel Setting Sensing Sensitivity: 2 mV
MDC IDC MSMT BATTERY REMAINING LONGEVITY: 78 mo
MDC IDC MSMT BATTERY VOLTAGE: 2.98 V
MDC IDC MSMT LEADCHNL RA PACING THRESHOLD PULSEWIDTH: 0.4 ms
MDC IDC MSMT LEADCHNL RV IMPEDANCE VALUE: 475 Ohm
MDC IDC MSMT LEADCHNL RV PACING THRESHOLD PULSEWIDTH: 0.4 ms
MDC IDC MSMT LEADCHNL RV SENSING INTR AMPL: 9.6 mV
MDC IDC SET LEADCHNL LV PACING AMPLITUDE: 2.5 V
MDC IDC SET LEADCHNL LV PACING PULSEWIDTH: 0.7 ms
MDC IDC SET LEADCHNL RA PACING AMPLITUDE: 2 V
MDC IDC SET LEADCHNL RV PACING AMPLITUDE: 2 V
MDC IDC SET LEADCHNL RV PACING PULSEWIDTH: 0.4 ms
MDC IDC STAT BRADY RA PERCENT PACED: 0.08 %

## 2013-09-19 NOTE — Progress Notes (Signed)
      Patient Care Team: [name], MD as PCP - General (Pediatrics) Deboraha Sprang, MD as Consulting Physician (Cardiology)   HPI  [name] is a 80 y.o. female  Seen in followup for CHF with previous CRT with LV lead dislodgement confirmed by CXR  in December she underwent downgrade of her device for ICD--pacemaker insertion of a new LV lead.   Efforts for afterload reduction complicated by hypotension  She underwent cath for the above and chest pain>>LV dysfunction mod 35-40% with [place] MR     Past Medical History  Diagnosis Date  . Nonischemic cardiomyopathy     Cath without coronary disease 2006  . Systolic CHF   . Hyperlipidemia   . Hypertension   . Osteoporosis   . Urge incontinence   . Raynaud phenomenon   . PAF (paroxysmal atrial fibrillation)     Short bursts of PAF. Asymptomatic.  Sees Dr Rex Kras.  . Biventricular ICD (implantable cardioverter-defibrillator)-[employer]     Date of implant 2007 generator change 2012  . Sinus tachycardia   . Macro dislodgment of the left ventricular lead 09/09/2012    Past Surgical History  Procedure Laterality Date  . Appendectomy    . Biventricular icd implant  06/28/2010; 03/28/2013    STJ CRTD implanted 2012 with dislodgement of LV lead 2014; 03/28/2013 CRTP placed with new LV lead by Dr Caryl Comes  . Bladder tack    . Cardiac catheterization  03/2005    EF 25%, Normal arteries, Nonischemic CM  . Abdominal hysterectomy  1970s  . Suprapubic urethrovesicular suspension  1992    Current Outpatient Prescriptions  Medication Sig Dispense Refill  . acetaminophen (TYLENOL) 650 MG CR tablet Take 1,300 mg by mouth daily.      Marland Kitchen aspirin 81 MG EC tablet Take 81 mg by mouth once.       . carvedilol (COREG) 3.125 MG tablet Take 3.125 mg by mouth 2 (two) times daily with a meal.      . Cholecalciferol (VITAMIN D-3) 1000 UNITS CAPS Take 1 capsule by mouth daily.      . Cranberry (SM CRANBERRY) 300 MG tablet Take 600 mg by  mouth 2 (two) times daily.       . digoxin (LANOXIN) 0.125 MG tablet Take 1 tablet (0.125 mg total) by mouth daily.  30 tablet  6  . furosemide (LASIX) 40 MG tablet Take 40 mg by mouth daily as needed for fluid.      Marland Kitchen losartan (COZAAR) 25 MG tablet Take 1 tablet (25 mg total) by mouth daily.  30 tablet  6   No current facility-administered medications for this visit.    Allergies  Allergen Reactions  . Sulfamethoxazole     REACTION: unspecified    Review of Systems negative except from HPI and PMH  Physical Exam BP 171/83  Pulse 88  Ht 5' 4.5" (1.638 m)  Wt 110 lb (49.896 kg)  BMI 18.60 kg/m2   S/p CRT -P   Feeling better following CRT repositioning  BP elevated   Will arrange followup

## 2013-09-22 ENCOUNTER — Other Ambulatory Visit: Payer: Self-pay

## 2013-09-22 MED ORDER — DIGOXIN 125 MCG PO TABS: 0.1250 mg | ORAL_TABLET | Freq: Every day | ORAL | Status: DC

## 2013-09-22 MED ORDER — FUROSEMIDE 40 MG PO TABS: 40.0000 mg | ORAL_TABLET | Freq: Every day | ORAL | Status: DC | PRN

## 2013-09-26 ENCOUNTER — Ambulatory Visit (INDEPENDENT_AMBULATORY_CARE_PROVIDER_SITE_OTHER): Payer: Commercial Managed Care - HMO | Admitting: Family Medicine

## 2013-09-26 ENCOUNTER — Encounter: Payer: Self-pay | Admitting: Family Medicine

## 2013-09-26 VITALS — BP 152/75 | HR 88 | Temp 96.3°F | Wt 108.3 lb

## 2013-09-26 DIAGNOSIS — G629 Polyneuropathy, unspecified: Secondary | ICD-10-CM

## 2013-09-26 DIAGNOSIS — M542 Cervicalgia: Secondary | ICD-10-CM

## 2013-09-26 DIAGNOSIS — G609 Hereditary and idiopathic neuropathy, unspecified: Secondary | ICD-10-CM

## 2013-09-26 MED ORDER — GABAPENTIN 300 MG PO CAPS: 300.0000 mg | ORAL_CAPSULE | Freq: Three times a day (TID) | ORAL | Status: DC

## 2013-09-26 NOTE — Patient Instructions (Addendum)
It was great to see you again today!  For your tingling, we are going to try a medicine called gabapentin. Take it three times daily. It may make you sleepy so use caution if you are going to drive.  For your neck, try heat and/or ice. See the separate handout with exercises.  Follow up with me in 1 month so we can see how these problems are doing.  Be well, Dr. [name]    Peripheral Neuropathy Peripheral neuropathy is a type of nerve damage. It affects nerves that carry signals between the spinal cord and other parts of the body. These are called peripheral nerves. With peripheral neuropathy, one nerve or a group of nerves may be damaged.  CAUSES  Many things can damage peripheral nerves. For some people with peripheral neuropathy, the cause is unknown. Some causes include:  Diabetes. This is the most common cause of peripheral neuropathy.  Injury to a nerve.  Pressure or stress on a nerve that lasts a long time.  Too little vitamin B. Alcoholism can lead to this.  Infections.  Autoimmune diseases, such as multiple sclerosis and systemic lupus erythematosus.  Inherited nerve diseases.  Some medicines, such as cancer drugs.  Toxic substances, such as lead and mercury.  Too little blood flowing to the legs.  Kidney disease.  Thyroid disease. SIGNS AND SYMPTOMS  Different people have different symptoms. The symptoms you have will depend on which of your nerves is damaged. Common symptoms include:  Loss of feeling (numbness) in the feet and hands.  Tingling in the feet and hands.  Pain that burns.  Very sensitive skin.  Weakness.  Not being able to move a part of the body (paralysis).  Muscle twitching.  Clumsiness or poor coordination.  Loss of balance.  Not being able to control your bladder.  Feeling dizzy.  Sexual problems. DIAGNOSIS  Peripheral neuropathy is a symptom, not a disease. Finding the cause of peripheral neuropathy can be hard. To  figure that out, your health care provider will take a medical history and do a physical exam. A neurological exam will also be done. This involves checking things affected by your brain, spinal cord, and nerves (nervous system). For example, your health care provider will check your reflexes, how you move, and what you can feel.  Other types of tests may also be ordered, such as:  Blood tests.  A test of the fluid in your spinal cord.  Imaging tests, such as CT scans or an MRI.  Electromyography (EMG). This test checks the nerves that control muscles.  Nerve conduction velocity tests. These tests check how fast messages pass through your nerves.  Nerve biopsy. A small piece of nerve is removed. It is then checked under a microscope. TREATMENT   Medicine is often used to treat peripheral neuropathy. Medicines may include:  Pain-relieving medicines. Prescription or over-the-counter medicine may be suggested.  Antiseizure medicine. This may be used for pain.  Antidepressants. These also may help ease pain from neuropathy.  Lidocaine. This is a numbing medicine. You might wear a patch or be given a shot.  Mexiletine. This medicine is typically used to help control irregular heart rhythms.  Surgery. Surgery may be needed to relieve pressure on a nerve or to destroy a nerve that is causing pain.  Physical therapy to help movement.  Assistive devices to help movement. HOME CARE INSTRUCTIONS   Only take over-the-counter or prescription medicines as directed by your health care provider. Follow the instructions  carefully for any given medicines. Do not take any other medicines without first getting approval from your health care provider.  If you have diabetes, work closely with your health care provider to keep your blood sugar under control.  If you have numbness in your feet:  Check every day for signs of injury or infection. Watch for redness, warmth, and swelling.  Wear padded  socks and comfortable shoes. These help protect your feet.  Do not do things that put pressure on your damaged nerve.  Do not smoke. Smoking keeps blood from getting to damaged nerves.  Avoid or limit alcohol. Too much alcohol can cause a lack of B vitamins. These vitamins are needed for healthy nerves.  Develop a good support system. Coping with peripheral neuropathy can be stressful. Talk to a mental health specialist or join a support group if you are struggling.  Follow up with your health care provider as directed. SEEK MEDICAL CARE IF:   You have new signs or symptoms of peripheral neuropathy.  You are struggling emotionally from dealing with peripheral neuropathy.  You have a fever. SEEK IMMEDIATE MEDICAL CARE IF:   You have an injury or infection that is not healing.  You feel very dizzy or begin vomiting.  You have chest pain.  You have trouble breathing. Document Released: 03/07/2002 Document Revised: 11/27/2010 Document Reviewed: 11/22/2012 [facility] Patient Information 2015 [place], [place]. This information is not intended to replace advice given to you by your health care provider. Make sure you discuss any questions you have with your health care provider.

## 2013-09-27 NOTE — Progress Notes (Signed)
Patient ID: [name], female   DOB: October 03, 1929, 80 y.o.   MRN: [id number]  HPI:  Neck pain: within the last month her left neck has been sore. Started by sleeping weird on it, but the pain has continued. Wants to know what she can do for this.  Leg pain: feels her legs cold/tingling. No weakness. It is in both legs but R is worse than L. Has chronic leg edema which comes and goes, has been present for years. No new or different chest pain.   ROS: See HPI  [place]: hx HLD, HTN, CHF s/p ICD placement  PHYSICAL EXAM: BP 152/75  Pulse 88  Temp(Src) 96.3 F (35.7 C) (Oral)  Wt 108 lb 4.8 oz (49.125 kg) Gen: NAD HEENT: NCAT. Full ROM of neck. Posterior neck musculature tender to palpation on L. No nuchal rigidity or meningeal signs. Heart: RRR Lungs: CTAB, NWOB Abdomen: soft, nontender to palpation Neuro: grossly nonfocal, speech normal, full strength in bilateral upper and lower extremities Ext: mild edema bilateral lower extremities. Sensation intact to light touch bilaterally. No foot drop. Full strength with all motions of bilateral ankles.  ASSESSMENT/PLAN:  # Neck pain: suspect strained trapezius muscle. Recommend heat/ice/tylenol prn pain. Given handout with exercises for neck pain.  See problem based charting for additional assessment/plan.   FOLLOW UP: F/u in one month for neck pain & leg tingling.  [place]. [name], [place]

## 2013-10-03 DIAGNOSIS — G629 Polyneuropathy, unspecified: Secondary | ICD-10-CM | POA: Insufficient documentation

## 2013-10-03 NOTE — Assessment & Plan Note (Signed)
Sx's of cold/tingling consistent with possible peripheral neuropathy. Will start low dose gabapentin. If persistent or resistant to gabapentin, consider checking B12 at next appointment.

## 2013-10-24 ENCOUNTER — Other Ambulatory Visit: Payer: Self-pay | Admitting: *Deleted

## 2013-10-26 ENCOUNTER — Ambulatory Visit (INDEPENDENT_AMBULATORY_CARE_PROVIDER_SITE_OTHER): Payer: Commercial Managed Care - HMO | Admitting: Family Medicine

## 2013-10-26 ENCOUNTER — Encounter: Payer: Self-pay | Admitting: Family Medicine

## 2013-10-26 VITALS — BP 137/78 | HR 89 | Temp 98.2°F | Ht 64.0 in | Wt 115.5 lb

## 2013-10-26 DIAGNOSIS — G609 Hereditary and idiopathic neuropathy, unspecified: Secondary | ICD-10-CM

## 2013-10-26 DIAGNOSIS — G629 Polyneuropathy, unspecified: Secondary | ICD-10-CM

## 2013-10-26 LAB — CBC
HCT: 39.9 % (ref 36.0–46.0)
Hemoglobin: 13.8 g/dL (ref 12.0–15.0)
MCH: 32 pg (ref 26.0–34.0)
MCHC: 34.6 g/dL (ref 30.0–36.0)
MCV: 92.6 fL (ref 78.0–100.0)
PLATELETS: 177 10*3/uL (ref 150–400)
RBC: 4.31 MIL/uL (ref 3.87–5.11)
RDW: 14.4 % (ref 11.5–15.5)
WBC: 7.2 10*3/uL (ref 4.0–10.5)

## 2013-10-26 LAB — COMPREHENSIVE METABOLIC PANEL
ALK PHOS: 60 U/L (ref 39–117)
ALT: 11 U/L (ref 0–35)
AST: 18 U/L (ref 0–37)
Albumin: 4 g/dL (ref 3.5–5.2)
BILIRUBIN TOTAL: 0.6 mg/dL (ref 0.2–1.2)
BUN: 14 mg/dL (ref 6–23)
CO2: 30 mEq/L (ref 19–32)
CREATININE: 0.69 mg/dL (ref 0.50–1.10)
Calcium: 9.4 mg/dL (ref 8.4–10.5)
Chloride: 101 mEq/L (ref 96–112)
GLUCOSE: 96 mg/dL (ref 70–99)
Potassium: 4.6 mEq/L (ref 3.5–5.3)
SODIUM: 138 meq/L (ref 135–145)
TOTAL PROTEIN: 6.5 g/dL (ref 6.0–8.3)

## 2013-10-26 LAB — POCT GLYCOSYLATED HEMOGLOBIN (HGB A1C): Hemoglobin A1C: 5.9

## 2013-10-26 NOTE — Assessment & Plan Note (Signed)
Did not tolerate gabapentin. Will start lab workup, including vit B12, CBC, CMET, TSH, A1C, RPR, HIV. F/u in 3-4 weeks to review results & decide next course of action. Of note, pt states that at her age, she would not want any invasive or extensive testing.

## 2013-10-26 NOTE — Patient Instructions (Signed)
It was great to see you again today!  We are checking bloodwork today to see if we can find a cause of the tingling in your feet. Follow up in 3-4 weeks to review the results and decide on a plan.  Be well, Dr. [name]

## 2013-10-26 NOTE — Progress Notes (Signed)
Patient ID: [name], female   DOB: 27-Oct-1929, 80 y.o.   MRN: [id number]   HPI:  Pt presents to f/u on the tingling in her legs & feet. The tingling has been present x 1 year. It is a cold, tingly feeling. Getting worse. R worse than L. Also feels somewhat clumsy although notes she has been clumsy her whole life. She sometimes feels like she is falling over her own feet. She was given rx for gabapentin at last visit but did not tolerate it because she felt foggy headed. Wonders if she could have some genetic or familial thing, because her mom and brother both had tremors badly. She does not have any tremor. Also notes easy bruising, but no abnormal bleeding.  ROS: See HPI. No new or different chest pain or SOB.  [place]: hx HLD, HTN, CHF s/p ICD placement  PHYSICAL EXAM: BP 137/78  Pulse 89  Temp(Src) 98.2 F (36.8 C) (Oral)  Ht 5\' 4"  (1.626 m)  Wt 115 lb 8 oz (52.39 kg)  BMI 19.82 kg/m2 Gen: NAD HEENT: NCAT Neuro: grossly nonfocal speech normal Ext: sensation intact over bilat lower ext. 2+ DP pulses bilat. Grip 5/5 bilat. Full strength with ankle dorsiflexion, plantarflexion, inversion, eversion bilat. No deformities, erythema, or significant swelling in feet.  ASSESSMENT/PLAN:  Peripheral neuropathy Did not tolerate gabapentin. Will start lab workup, including vit B12, CBC, CMET, TSH, A1C, RPR, HIV. F/u in 3-4 weeks to review results & decide next course of action. Of note, pt states that at her age, she would not want any invasive or extensive testing.   FOLLOW UP: F/u in 3-4 weeks for feet tingling.  [place]. [name], [place]

## 2013-10-27 ENCOUNTER — Encounter: Payer: Self-pay | Admitting: Family Medicine

## 2013-10-27 LAB — HIV ANTIBODY (ROUTINE TESTING W REFLEX): HIV 1&2 Ab, 4th Generation: NONREACTIVE

## 2013-10-27 LAB — VITAMIN B12: Vitamin B-12: 356 pg/mL (ref 211–911)

## 2013-10-27 LAB — RPR

## 2013-10-27 LAB — TSH: TSH: 1.383 u[IU]/mL (ref 0.350–4.500)

## 2013-11-21 ENCOUNTER — Ambulatory Visit (INDEPENDENT_AMBULATORY_CARE_PROVIDER_SITE_OTHER): Payer: Commercial Managed Care - HMO | Admitting: Family Medicine

## 2013-11-21 ENCOUNTER — Encounter: Payer: Self-pay | Admitting: Family Medicine

## 2013-11-21 VITALS — BP 133/63 | HR 84 | Temp 97.8°F | Ht 64.0 in | Wt 109.9 lb

## 2013-11-21 DIAGNOSIS — G629 Polyneuropathy, unspecified: Secondary | ICD-10-CM

## 2013-11-21 DIAGNOSIS — G609 Hereditary and idiopathic neuropathy, unspecified: Secondary | ICD-10-CM

## 2013-11-21 NOTE — Progress Notes (Signed)
Patient ID: [name], female   DOB: [birth year], 80 y.o.   MRN: [id number]  HPI:  F/u peripheral neuropathy: has a friend who recommended capsaicin cream. Has been using 0.025% three times a day, it helps. Does not make pain go away entirely but helps a lot and makes it easier for her to sleep. No weakness in legs, gets around well. No rashes or sores.  ROS: See HPI.  [place]: hx HLD, HTN, systolic CHF, osteoporosis, ICD  PHYSICAL EXAM: BP 133/63  Pulse 84  Temp(Src) 97.8 F (36.6 C) (Oral)  Ht 5\' 4"  (1.626 m)  Wt 109 lb 14.4 oz (49.85 kg)  BMI 18.85 kg/m2 Gen:  NAD HEENT: NCAT Lungs: normal respiratory effort, speaks in full sentences Neuro: grossly nonfocal, speech normal Ext: mild edema bilateral lower extremities, no pitting. 1-2+ DP pulses bilat feet. Full strength in both legs with knee flexion & extension and ankle plantarflexion & dorsiflexion. No sores on feet. Sensation intact to light touch on bottom of feet bilaterally.  ASSESSMENT/PLAN:  Peripheral neuropathy Reviewed lab testing from last visit with patient, which was all entirely normal. She has found significant relief with capsaicin cream topical on her feet/legs. Will continue this medicine and f/u prn.    FOLLOW UP: F/u as needed if symptoms worsen or do not improve.   [place]. [name], [place]

## 2013-11-21 NOTE — Assessment & Plan Note (Signed)
Reviewed lab testing from last visit with patient, which was all entirely normal. She has found significant relief with capsaicin cream topical on her feet/legs. Will continue this medicine and f/u prn.

## 2013-11-21 NOTE — Patient Instructions (Signed)
It was great to see you again today!  I'm glad the capsaicin cream is helping. Continue using it as needed. Return if the neuropathy worsens, or if you have any weakness in your legs.  Be well, Dr. [name]

## 2013-12-08 ENCOUNTER — Encounter: Payer: Self-pay | Admitting: Internal Medicine

## 2013-12-08 ENCOUNTER — Ambulatory Visit (INDEPENDENT_AMBULATORY_CARE_PROVIDER_SITE_OTHER): Payer: Commercial Managed Care - HMO | Admitting: Internal Medicine

## 2013-12-08 VITALS — BP 138/64 | HR 75 | Ht 65.0 in | Wt 109.0 lb

## 2013-12-08 DIAGNOSIS — Z95810 Presence of automatic (implantable) cardiac defibrillator: Secondary | ICD-10-CM

## 2013-12-08 DIAGNOSIS — I5022 Chronic systolic (congestive) heart failure: Secondary | ICD-10-CM

## 2013-12-08 DIAGNOSIS — I428 Other cardiomyopathies: Secondary | ICD-10-CM

## 2013-12-08 LAB — MDC_IDC_ENUM_SESS_TYPE_INCLINIC
Battery Voltage: 2.98 V
Brady Statistic RA Percent Paced: 0.67 %
Implantable Pulse Generator Model: 3242
Implantable Pulse Generator Serial Number: 7554613
Lead Channel Impedance Value: 425 Ohm
Lead Channel Impedance Value: 512.5 Ohm
Lead Channel Pacing Threshold Amplitude: 0.75 V
Lead Channel Pacing Threshold Amplitude: 0.75 V
Lead Channel Pacing Threshold Pulse Width: 0.4 ms
Lead Channel Pacing Threshold Pulse Width: 0.7 ms
Lead Channel Sensing Intrinsic Amplitude: 7.2 mV
Lead Channel Setting Pacing Amplitude: 2 V
Lead Channel Setting Pacing Pulse Width: 0.4 ms
Lead Channel Setting Pacing Pulse Width: 0.7 ms
MDC IDC MSMT BATTERY REMAINING LONGEVITY: 58.8 mo
MDC IDC MSMT LEADCHNL LV PACING THRESHOLD AMPLITUDE: 1.75 V
MDC IDC MSMT LEADCHNL RA IMPEDANCE VALUE: 350 Ohm
MDC IDC MSMT LEADCHNL RA PACING THRESHOLD AMPLITUDE: 0.75 V
MDC IDC MSMT LEADCHNL RA PACING THRESHOLD PULSEWIDTH: 0.4 ms
MDC IDC MSMT LEADCHNL RA SENSING INTR AMPL: 4 mV
MDC IDC MSMT LEADCHNL RV PACING THRESHOLD AMPLITUDE: 0.75 V
MDC IDC MSMT LEADCHNL RV PACING THRESHOLD PULSEWIDTH: 0.4 ms
MDC IDC MSMT LEADCHNL RV PACING THRESHOLD PULSEWIDTH: 0.4 ms
MDC IDC SESS DTM: 20150910095859
MDC IDC SET LEADCHNL LV PACING AMPLITUDE: 2.75 V
MDC IDC SET LEADCHNL RV PACING AMPLITUDE: 2.5 V
MDC IDC SET LEADCHNL RV SENSING SENSITIVITY: 2 mV
MDC IDC STAT BRADY RV PERCENT PACED: 98 %

## 2013-12-08 NOTE — Progress Notes (Signed)
Patient Care Team: [name], MD as PCP - General (Pediatrics) Deboraha Sprang, MD as Consulting Physician (Cardiology)   HPI  [name] is a 80 y.o. female  Seen in followup for CHF with previous CRT with LV lead dislodgement confirmed by CXR  in December she underwent downgrade of her device for ICD-->>   this is pacemaker insertion of a new LV lead.   Efforts for afterload reduction complicated by hypotension  She underwent cath for the above and chest pain>>LV dysfunction mod 35-40% with [place] MR   Her biggest complaint now is dysgeusia. She wonders whether it is related to some of her medications.  Dyspnea is not an issue except very infrequently.    Past Medical History  Diagnosis Date  . Nonischemic cardiomyopathy     Cath without coronary disease 2006  . Systolic CHF   . Hyperlipidemia   . Hypertension   . Osteoporosis   . Urge incontinence   . Raynaud phenomenon   . PAF (paroxysmal atrial fibrillation)     Short bursts of PAF. Asymptomatic.  Sees Dr Rex Kras.  . Biventricular ICD (implantable cardioverter-defibrillator)-[employer]     Date of implant 2007 generator change 2012  . Sinus tachycardia   . Macro dislodgment of the left ventricular lead 09/09/2012    Past Surgical History  Procedure Laterality Date  . Appendectomy    . Biventricular icd implant  06/28/2010; 03/28/2013    STJ CRTD implanted 2012 with dislodgement of LV lead 2014; 03/28/2013 CRTP placed with new LV lead by Dr Caryl Comes  . Bladder tack    . Cardiac catheterization  03/2005    EF 25%, Normal arteries, Nonischemic CM  . Abdominal hysterectomy  1970s  . Suprapubic urethrovesicular suspension  1992    Current Outpatient Prescriptions  Medication Sig Dispense Refill  . acetaminophen (TYLENOL) 650 MG CR tablet Take 1,300 mg by mouth at bedtime.       Marland Kitchen aspirin 81 MG EC tablet Take 81 mg by mouth once.       . carvedilol (COREG) 3.125 MG tablet Take 3.125 mg by mouth 2 (two)  times daily with a meal.      . Cholecalciferol (VITAMIN D-3) 1000 UNITS CAPS Take 1 capsule by mouth daily.      . Cranberry (SM CRANBERRY) 300 MG tablet Take 600 mg by mouth 2 (two) times daily.       . digoxin (LANOXIN) 0.125 MG tablet Take 1 tablet (0.125 mg total) by mouth daily.  30 tablet  6  . furosemide (LASIX) 40 MG tablet Take 1 tablet (40 mg total) by mouth daily as needed for fluid.  30 tablet  3  . losartan (COZAAR) 25 MG tablet Take 1 tablet (25 mg total) by mouth daily.  30 tablet  6   No current facility-administered medications for this visit.    Allergies  Allergen Reactions  . Sulfamethoxazole     REACTION: unspecified    Review of Systems negative except from HPI and PMH  Physical Exam BP 138/64  Pulse 75  Ht 5\' 5"  (1.651 m)  Wt 109 lb (49.442 kg)  BMI 18.14 kg/m2  Well developed and nourished in no acute distress HENT normal Neck supple with JVP-flat Clear Regular rate and rhythm, no murmurs or gallops Abd-soft with active BS No Clubbing cyanosis edema Skin-warm and dry A & Oriented  Grossly normal sensory and motor function    Assessment and plan  Nonischemic cardiomyopathy  Congestive heart failure-chronic systolic  Implantable device-CRT>>> defibrillator-->pacemaker  Dysgeusia  We'll undertake any serial exclusion trial. We'll stop her digoxin for a month. We'll have her seen originally stop her carvedilol in her losartan. She will review of after total of 8 weeks. I will see her shortly thereafter.  Euvolemic continue current meds   Medical therapy for cardiopathy as listed above.   The patient's device was interrogated.  The information was reviewed. No changes were made in the programming.

## 2013-12-08 NOTE — Patient Instructions (Addendum)
Remote monitoring is used to monitor your pacemaker from home. This monitoring reduces the number of office visits required to check your device to one time per year. It allows [place] to keep an eye on the functioning of your device to ensure it is working properly. You are scheduled for a device check from home on 03-13-2014. You may send your transmission at any time that day. If you have a wireless device, the transmission will be sent automatically. After your physician reviews your transmission, you will receive a postcard with your next transmission date.  Your physician recommends that you schedule a follow-up appointment in: DECEMBER 2015 with [place]  Your physician has recommended you make the following change in your medication: 1) STOP DIGOXIN. 2) AFTER ONE MONTH STOP COREG. (you will not be taking Digoxin or coreg for this 2 week time) 3) AFTER two more weeks, STOP LOSARTAN. (you will not be taking Digoxin, Coreg, or Losartan for this two week time). 4) After the 8 week total, RESTART the medication/medications that you do not feel are not causing unwanted side effects.

## 2013-12-16 ENCOUNTER — Encounter: Payer: Self-pay | Admitting: Internal Medicine

## 2013-12-19 ENCOUNTER — Emergency Department (HOSPITAL_COMMUNITY): Payer: Medicare HMO

## 2013-12-19 ENCOUNTER — Inpatient Hospital Stay (HOSPITAL_COMMUNITY)
Admission: EM | Admit: 2013-12-19 | Discharge: 2013-12-22 | DRG: 470 | Disposition: A | Discharge status: Skilled Nursing Facility | Payer: Medicare HMO | Attending: Orthopedic Surgery | Admitting: Orthopedic Surgery

## 2013-12-19 ENCOUNTER — Encounter (HOSPITAL_COMMUNITY): Payer: Self-pay | Admitting: Emergency Medicine

## 2013-12-19 DIAGNOSIS — S93629A Sprain of tarsometatarsal ligament of unspecified foot, initial encounter: Secondary | ICD-10-CM | POA: Diagnosis present

## 2013-12-19 DIAGNOSIS — G609 Hereditary and idiopathic neuropathy, unspecified: Secondary | ICD-10-CM | POA: Diagnosis present

## 2013-12-19 DIAGNOSIS — R4181 Age-related cognitive decline: Secondary | ICD-10-CM | POA: Diagnosis present

## 2013-12-19 DIAGNOSIS — Z681 Body mass index (BMI) 19 or less, adult: Secondary | ICD-10-CM

## 2013-12-19 DIAGNOSIS — I498 Other specified cardiac arrhythmias: Secondary | ICD-10-CM | POA: Diagnosis present

## 2013-12-19 DIAGNOSIS — I10 Essential (primary) hypertension: Secondary | ICD-10-CM | POA: Diagnosis present

## 2013-12-19 DIAGNOSIS — Y9229 Other specified public building as the place of occurrence of the external cause: Secondary | ICD-10-CM

## 2013-12-19 DIAGNOSIS — Z950 Presence of cardiac pacemaker: Secondary | ICD-10-CM | POA: Diagnosis not present

## 2013-12-19 DIAGNOSIS — R0902 Hypoxemia: Secondary | ICD-10-CM | POA: Diagnosis present

## 2013-12-19 DIAGNOSIS — I959 Hypotension, unspecified: Secondary | ICD-10-CM | POA: Diagnosis not present

## 2013-12-19 DIAGNOSIS — R209 Unspecified disturbances of skin sensation: Secondary | ICD-10-CM | POA: Diagnosis present

## 2013-12-19 DIAGNOSIS — I7300 Raynaud's syndrome without gangrene: Secondary | ICD-10-CM | POA: Diagnosis present

## 2013-12-19 DIAGNOSIS — S72009A Fracture of unspecified part of neck of unspecified femur, initial encounter for closed fracture: Principal | ICD-10-CM | POA: Diagnosis present

## 2013-12-19 DIAGNOSIS — M810 Age-related osteoporosis without current pathological fracture: Secondary | ICD-10-CM | POA: Diagnosis present

## 2013-12-19 DIAGNOSIS — Z833 Family history of diabetes mellitus: Secondary | ICD-10-CM

## 2013-12-19 DIAGNOSIS — R0682 Tachypnea, not elsewhere classified: Secondary | ICD-10-CM | POA: Diagnosis present

## 2013-12-19 DIAGNOSIS — Z79899 Other long term (current) drug therapy: Secondary | ICD-10-CM | POA: Diagnosis not present

## 2013-12-19 DIAGNOSIS — E785 Hyperlipidemia, unspecified: Secondary | ICD-10-CM | POA: Diagnosis present

## 2013-12-19 DIAGNOSIS — M25559 Pain in unspecified hip: Secondary | ICD-10-CM | POA: Diagnosis present

## 2013-12-19 DIAGNOSIS — I5022 Chronic systolic (congestive) heart failure: Secondary | ICD-10-CM | POA: Diagnosis not present

## 2013-12-19 DIAGNOSIS — Z7901 Long term (current) use of anticoagulants: Secondary | ICD-10-CM

## 2013-12-19 DIAGNOSIS — M62838 Other muscle spasm: Secondary | ICD-10-CM | POA: Diagnosis present

## 2013-12-19 DIAGNOSIS — Z7982 Long term (current) use of aspirin: Secondary | ICD-10-CM

## 2013-12-19 DIAGNOSIS — Z87891 Personal history of nicotine dependence: Secondary | ICD-10-CM | POA: Diagnosis not present

## 2013-12-19 DIAGNOSIS — I428 Other cardiomyopathies: Secondary | ICD-10-CM | POA: Diagnosis present

## 2013-12-19 DIAGNOSIS — Z8249 Family history of ischemic heart disease and other diseases of the circulatory system: Secondary | ICD-10-CM

## 2013-12-19 DIAGNOSIS — N390 Urinary tract infection, site not specified: Secondary | ICD-10-CM | POA: Diagnosis not present

## 2013-12-19 DIAGNOSIS — Z8051 Family history of malignant neoplasm of kidney: Secondary | ICD-10-CM

## 2013-12-19 DIAGNOSIS — Z66 Do not resuscitate: Secondary | ICD-10-CM | POA: Diagnosis present

## 2013-12-19 DIAGNOSIS — I509 Heart failure, unspecified: Secondary | ICD-10-CM | POA: Diagnosis present

## 2013-12-19 DIAGNOSIS — I9589 Other hypotension: Secondary | ICD-10-CM

## 2013-12-19 DIAGNOSIS — M5030 Other cervical disc degeneration, unspecified cervical region: Secondary | ICD-10-CM | POA: Diagnosis present

## 2013-12-19 DIAGNOSIS — R296 Repeated falls: Secondary | ICD-10-CM | POA: Diagnosis present

## 2013-12-19 DIAGNOSIS — I4891 Unspecified atrial fibrillation: Secondary | ICD-10-CM | POA: Diagnosis present

## 2013-12-19 DIAGNOSIS — S72002A Fracture of unspecified part of neck of left femur, initial encounter for closed fracture: Secondary | ICD-10-CM

## 2013-12-19 LAB — URINALYSIS, ROUTINE W REFLEX MICROSCOPIC
BILIRUBIN URINE: NEGATIVE
Glucose, UA: NEGATIVE mg/dL
Ketones, ur: 15 mg/dL — AB
NITRITE: POSITIVE — AB
PROTEIN: NEGATIVE mg/dL
Specific Gravity, Urine: 1.014 (ref 1.005–1.030)
UROBILINOGEN UA: 0.2 mg/dL (ref 0.0–1.0)
pH: 5.5 (ref 5.0–8.0)

## 2013-12-19 LAB — URINE MICROSCOPIC-ADD ON

## 2013-12-19 LAB — COMPREHENSIVE METABOLIC PANEL
ALT: 13 U/L (ref 0–35)
AST: 21 U/L (ref 0–37)
Albumin: 3.4 g/dL — ABNORMAL LOW (ref 3.5–5.2)
Alkaline Phosphatase: 67 U/L (ref 39–117)
Anion gap: 13 (ref 5–15)
BUN: 16 mg/dL (ref 6–23)
CALCIUM: 9.1 mg/dL (ref 8.4–10.5)
CO2: 24 meq/L (ref 19–32)
CREATININE: 0.68 mg/dL (ref 0.50–1.10)
Chloride: 103 mEq/L (ref 96–112)
GFR calc Af Amer: >90 mL/min (ref >90)
GFR, EST NON AFRICAN AMERICAN: 80 mL/min — AB (ref >90)
Glucose, Bld: 116 mg/dL — ABNORMAL HIGH (ref 70–99)
Potassium: 4.2 mEq/L (ref 3.7–5.3)
SODIUM: 140 meq/L (ref 137–147)
TOTAL PROTEIN: 6.4 g/dL (ref 6.0–8.3)
Total Bilirubin: 0.4 mg/dL (ref 0.3–1.2)

## 2013-12-19 LAB — DIGOXIN LEVEL

## 2013-12-19 LAB — CBC WITH DIFFERENTIAL/PLATELET
Basophils Absolute: 0 10*3/uL (ref 0.0–0.1)
Basophils Relative: 1 % (ref 0–1)
EOS ABS: 0.1 10*3/uL (ref 0.0–0.7)
EOS PCT: 2 % (ref 0–5)
HEMATOCRIT: 43.1 % (ref 36.0–46.0)
Hemoglobin: 14.6 g/dL (ref 12.0–15.0)
LYMPHS PCT: 19 % (ref 12–46)
Lymphs Abs: 1.4 10*3/uL (ref 0.7–4.0)
MCH: 32 pg (ref 26.0–34.0)
MCHC: 33.9 g/dL (ref 30.0–36.0)
MCV: 94.5 fL (ref 78.0–100.0)
MONO ABS: 0.5 10*3/uL (ref 0.1–1.0)
Monocytes Relative: 7 % (ref 3–12)
Neutro Abs: 5.1 10*3/uL (ref 1.7–7.7)
Neutrophils Relative %: 71 % (ref 43–77)
PLATELETS: 144 10*3/uL — AB (ref 150–400)
RBC: 4.56 MIL/uL (ref 3.87–5.11)
RDW: 13.3 % (ref 11.5–15.5)
WBC: 7.1 10*3/uL (ref 4.0–10.5)

## 2013-12-19 LAB — MAGNESIUM: Magnesium: 1.6 mg/dL (ref 1.5–2.5)

## 2013-12-19 LAB — TYPE AND SCREEN
ABO/RH(D): O POS
ANTIBODY SCREEN: NEGATIVE

## 2013-12-19 LAB — ABO/RH: ABO/RH(D): O POS

## 2013-12-19 MED ORDER — FENTANYL CITRATE 0.05 MG/ML IJ SOLN
100.0000 ug | INTRAMUSCULAR | Status: DC | PRN
  Administered 2013-12-19 (×3): 100 ug via INTRAVENOUS
  Filled 2013-12-19 (×3): qty 2

## 2013-12-19 MED ORDER — TRAZODONE HCL 50 MG PO TABS: 50.0000 mg | ORAL_TABLET | Freq: Every evening | ORAL | Status: DC | PRN

## 2013-12-19 MED ORDER — CYCLOBENZAPRINE HCL 5 MG PO TABS
5.0000 mg | ORAL_TABLET | Freq: Once | ORAL | Status: AC
  Administered 2013-12-19: 5 mg via ORAL
  Filled 2013-12-19: qty 1

## 2013-12-19 MED ORDER — MORPHINE SULFATE 2 MG/ML IJ SOLN
1.0000 mg | INTRAMUSCULAR | Status: DC
  Administered 2013-12-19 – 2013-12-21 (×6): 1 mg via INTRAVENOUS
  Filled 2013-12-19 (×6): qty 1

## 2013-12-19 MED ORDER — SODIUM CHLORIDE 0.9 % IV BOLUS (SEPSIS)
500.0000 mL | Freq: Once | INTRAVENOUS | Status: AC
  Administered 2013-12-19: 500 mL via INTRAVENOUS

## 2013-12-19 MED ORDER — SODIUM CHLORIDE 0.9 % IV SOLN: INTRAVENOUS | Status: DC

## 2013-12-19 MED ORDER — SODIUM CHLORIDE 0.9 % IJ SOLN
3.0000 mL | Freq: Two times a day (BID) | INTRAMUSCULAR | Status: DC
  Administered 2013-12-21 – 2013-12-22 (×2): 3 mL via INTRAVENOUS

## 2013-12-19 MED ORDER — HYDROMORPHONE HCL 1 MG/ML IJ SOLN
0.5000 mg | INTRAMUSCULAR | Status: DC
  Administered 2013-12-19: 0.5 mg via INTRAVENOUS
  Filled 2013-12-19: qty 1

## 2013-12-19 MED ORDER — CYCLOBENZAPRINE HCL 5 MG PO TABS
7.5000 mg | ORAL_TABLET | Freq: Every day | ORAL | Status: DC
  Administered 2013-12-19: 7.5 mg via ORAL
  Filled 2013-12-19 (×2): qty 1.5

## 2013-12-19 MED ORDER — VITAMIN D3 25 MCG (1000 UNIT) PO TABS
1000.0000 [IU] | ORAL_TABLET | Freq: Every day | ORAL | Status: DC
  Administered 2013-12-20: 1000 [IU] via ORAL
  Filled 2013-12-19: qty 1

## 2013-12-19 MED ORDER — CARVEDILOL 3.125 MG PO TABS
3.1250 mg | ORAL_TABLET | Freq: Two times a day (BID) | ORAL | Status: DC
  Administered 2013-12-20 – 2013-12-21 (×3): 3.125 mg via ORAL
  Filled 2013-12-19 (×7): qty 1

## 2013-12-19 MED ORDER — SODIUM CHLORIDE 0.9 % IV SOLN
INTRAVENOUS | Status: AC
  Administered 2013-12-19: 23:00:00 via INTRAVENOUS

## 2013-12-19 MED ORDER — HYDROMORPHONE HCL 1 MG/ML IJ SOLN
0.5000 mg | INTRAMUSCULAR | Status: DC | PRN
  Administered 2013-12-20: 0.5 mg via INTRAVENOUS
  Filled 2013-12-19: qty 1

## 2013-12-19 NOTE — H&P (Signed)
[facility] Admission History and Physical Service Pager: [phone]  Patient name: [name] Medical record number: [id number] Date of birth: November 28, 1929 Age: 80 y.o. Gender: female  Primary Care Provider: Chrisandra Netters, MD Consultants: Orthopedics Code Status: DNR  Chief Complaint: Left hip fracture  Assessment and Plan: VINA BYRD is a 80 y.o. female presenting with left hip fracture after suffering a mechanical fall . PMH is significant for systolic heart failure (with pacer, no AICD), NICM, HTN, PAF, osteoporosis, and HLD. Also with prolonged QTc on EKG and UTI.  #Left Hip Fracture s/p knee buckling. Fall less likely to be due to cardiac etiology (no chest pain, SOB, diaphoresis, or LOC) or neurologic etiology (no focal deficits on exam). Plain films on CXR show non displaced left hip fracture. Neurovascularly intact in lower extremities bilaterally. -Orthopedics consulted and called -Pain management: Morphine 1mg  q2hrs, dilaudid 0.5mg  prn, both with hold parameters for SBP<90 or DBP<50. Transition to oral pain regimen and add bowel regimen after surgery.  -s/p flexeril 5mg  x1 for muscle spasms, consider continuing if helps -Continue to monitor for occult injuries that may be overlooked in setting of major fracture -PT/OT consults for after surgery.  #Hypoxia, tachypnea. Sats to upper 80s on 2L [place], inconsistent waveform. Potentially related to hyperventilation and poor inspiration secondary to acute pain. Lungs clear to auscultation and pt in no distress speaking in full sentences. Could also consider more serious etiologies such as PE or fat embolus (given additional finding of tachycardia) if patient does not improve. - O2 as needed to keep O2 sats > 92% - continuous pulseox - Consider CXR or potentially CTA if not improving   #Systolic CHF (s/p pacemaker placement). Also with NICM. Followed by Dr Caryl Comes. Last EF showed EF of 35-40% with mild MR.  Recently discontinued digoxin. -continue home coreg -holding home cozaar, aspirin for now  #UTI. UA remarkable for nitrites and moderate leukocytes. Will hold off on treatment at this time as the patient is asymptomatic and is afebrile and has a normal WBC. -f/u urine culture; if positive with no other criteria from [facility], would not tx. -f/u CBC  #Prolonged QTc. 579ms on admission. 443ms three months ago. Normal K. No chest pain. No QT prolonging medications identified, though uncertain contribution from digoxin. - f/u morning EKG - f/u magnesium level  #HTN. Coreg as above. Holding cozaar.  #HLD. No statins listed on home medication list. Unsure if patient has been on in the past. Can consider adding on after surgery.  FEN/GI: NPO, NS @ 29mL/hr for 12 hours Prophylaxis: SCDs, holding pharmacologic DVT prophylaxis in setting of anticipated surgery.  Disposition: Admitted to telemetry under attending Dr Nori Riis, with discharge pending evaluation and likely surgical treatment by ortho.  History of Present Illness: [name] is a 80 y.o. female presenting with displaced left hip fracture after suffering a mechanical fall earlier in the day at the eye doctor's office. Reports that she was leaving the office and turned around when her left knee gave out. She has had problems with this knee in the past due to OA. She then fell and hit back of head and landed on her right hip. Denies loss of consciousness. Denies any preceding dizziness, chest pain, or shortness of breath. Soon after noticed pain in left hip. She did not attempt to put any weight on legs after fall. EMS was called and the patient presented to the ED. Per chart review, the patient received fentanyl via EMS  with partial relief of pain.  In the ED, workup included hip plain films which showed a displaced left femoral neck fracture. Also had CT of head and C-spine which showed no acute abnormalities. EKG showed a paced rhythm and  prolonged QTc (523) but no signs of ischemia.  Patient still has significant pain in left hip and with intermittent spasms. Denies right hip pain. Says that her pain is "horrible" if she moves or has a spasm, otherwise is tolerable if she does not move. Head does not hurt. Numbness in legs unchanged secondary to peripheral neuropathy.  Denies any recent fevers, chills, or illnesses. No dysuria or other symptoms of UTI. Patient is very active at home and does own yardwork.   Followed by cardiology, last visit on 12/08/2013 when she stopped digoxin. Also reports that she currently has a pacemaker, and chose not to have AICD placed back.    Review Of Systems: Per HPI, otherwise 12 point review of systems was performed and was unremarkable.  Patient Active Problem List   Diagnosis Date Noted  . Hip fracture 12/19/2013  . Peripheral neuropathy 10/03/2013  . PVC's (premature ventricular contractions) 01/12/2013  . Macro dislodgment of the left ventricular lead 09/09/2012  . Biventricular ICD (implantable cardioverter-defibrillator)-St. Jude   . Nonischemic cardiomyopathy   . Sinus tachycardia   . Mole (skin) 06/08/2011  . Left knee pain 04/11/2011  . FATIGUE 03/11/2010  . HYPERLIPIDEMIA 05/28/2006  . HYPERTENSION, BENIGN SYSTEMIC 05/28/2006  . Chronic systolic heart failure 32/44/0102  . Osteoporosis, unspecified 05/28/2006  . INCONTINENCE, URGE 05/28/2006   Past Medical History: Past Medical History  Diagnosis Date  . Nonischemic cardiomyopathy     Cath without coronary disease 2006  . Systolic CHF   . Hyperlipidemia   . Hypertension   . Osteoporosis   . Urge incontinence   . Raynaud phenomenon   . PAF (paroxysmal atrial fibrillation)     Short bursts of PAF. Asymptomatic.  Sees Dr Rex Kras.  . Biventricular ICD (implantable cardioverter-defibrillator)-[employer]. Jude     Date of implant 2007 generator change 2012  . Sinus tachycardia   . Macro dislodgment of the left ventricular lead  09/09/2012   Past Surgical History: Past Surgical History  Procedure Laterality Date  . Appendectomy    . Biventricular icd implant  06/28/2010; 03/28/2013    STJ CRTD implanted 2012 with dislodgement of LV lead 2014; 03/28/2013 CRTP placed with new LV lead by Dr Caryl Comes  . Bladder tack    . Cardiac catheterization  03/2005    EF 25%, Normal arteries, Nonischemic CM  . Abdominal hysterectomy  1970s  . Suprapubic urethrovesicular suspension  1992   Social History: History  Substance Use Topics  . Smoking status: Former Smoker    Quit date: 04/22/2002  . Smokeless tobacco: Never Used  . 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   Additional social history:  Smoked 35-40 years. No EtOH or narcotics in 37 years.  Please also refer to relevant sections of EMR.  Family History: Family History  Problem Relation Age of Onset  . Heart disease Mother   . Heart disease Father   . Cancer Father     Kidney  . Heart disease Brother   . Diabetes Son    Allergies and Medications: Allergies  Allergen Reactions  . Sulfamethoxazole     REACTION: unspecified   No current facility-administered medications on file prior to encounter.   Current Outpatient  Prescriptions on File Prior to Encounter  Medication Sig Dispense Refill  . acetaminophen (TYLENOL) 650 MG CR tablet Take 1,300 mg by mouth at bedtime.       Marland Kitchen aspirin 81 MG EC tablet Take 81 mg by mouth daily.       . carvedilol (COREG) 3.125 MG tablet Take 3.125 mg by mouth 2 (two) times daily with a meal.      . Cholecalciferol (VITAMIN D-3) 1000 UNITS CAPS Take 1 capsule by mouth daily.      . Cranberry (SM CRANBERRY) 300 MG tablet Take 600 mg by mouth 2 (two) times daily.       . digoxin (LANOXIN) 0.125 MG tablet Take 1 tablet (0.125 mg total) by mouth daily.  30 tablet  6  . furosemide (LASIX) 40 MG tablet Take 1 tablet (40 mg total) by mouth daily as needed for fluid.  30 tablet  3  Digoxin discontinued  12/08/13 by cardiologist.  Objective: BP 144/59  Pulse 102  Temp(Src) 98.2 F (36.8 C) (Oral)  Resp 20  Ht 5\' 5"  (1.651 m)  Wt 108 lb (48.988 kg)  BMI 17.97 kg/m2  SpO2 97% Exam: General: Uncomfortably appearing but in NAD, elderly woman lying supine in bed with [place] in place (2L) HEENT: PERRL and 37mm bilaterally. EOMI. Nontender to palpation along posterior aspect of head, no hematoma or deformities noted. Upper and lower dentures in place, oropharynx otherwise clear with MMM, no facial tenderness, neck supple Cardiovascular: Tachycardic rate at 100-110s, regular rhythm with occasional ectopic beats. No murmurs appreciated Respiratory: Nasal cannula in place, mildly increased work of breathing. Anterior lung fields CTAB with no wheezes, crackles, or ronchi. Abdomen: +BS, soft, nontender, nondistended Extremities: LLE shortened and externally rotated, left hip tender to palpation along inguinal line with mild fullness at ASIS. Right hip non tender to palpation. 1+ DP pulses bilaterally. Brisk capillary refill in feet bilaterally.  Skin: warm, dry. Neuro: Hearing grossly diminished on left (pt thinks chronic), otherwise CN2-12 intact. Normal speech. Motor exam limited secondary to pain, but with 5/5 strength in foot plantar and dorsiflexion bilaterally. Sensation grossly intact in feet bilaterally   Labs: CBC BMET   Recent Labs Lab 12/19/13 1414  WBC 7.1  HGB 14.6  HCT 43.1  PLT 144*    Recent Labs Lab 12/19/13 1414  NA 140  K 4.2  CL 103  CO2 24  BUN 16  CREATININE 0.68  GLUCOSE 116*  CALCIUM 9.1     Urinalysis    Component Value Date/Time   COLORURINE YELLOW 12/19/2013 1759   APPEARANCEUR CLEAR 12/19/2013 1759   LABSPEC 1.014 12/19/2013 1759   PHURINE 5.5 12/19/2013 1759   GLUCOSEU NEGATIVE 12/19/2013 1759   HGBUR TRACE* 12/19/2013 1759   HGBUR negative 01/26/2008 1047   BILIRUBINUR NEGATIVE 12/19/2013 1759   BILIRUBINUR NEG 12/30/2011 1657   KETONESUR 15* 12/19/2013  1759   PROTEINUR NEGATIVE 12/19/2013 1759   PROTEINUR NEG 12/30/2011 1657   UROBILINOGEN 0.2 12/19/2013 1759   UROBILINOGEN 0.2 12/30/2011 1657   NITRITE POSITIVE* 12/19/2013 1759   NITRITE POSITIVE 12/30/2011 1657   LEUKOCYTESUR MODERATE* 12/19/2013 1759    Digoxin <0.3  EKG showed paced rhythm, prolonged QTc (523) with occasional PVCs and no acute signs of ischemia  Imaging:  Dg Hip Bilateral W/pelvis 12/19/2013    IMPRESSION: Displaced LEFT femoral neck fracture.  Osseous demineralization.     Ct Head Wo Contrast / Ct Cervical Spine Wo Contrast 12/19/2013    IMPRESSION: Atrophy  with small vessel chronic ischemic changes of deep cerebral white matter.  No acute intracranial abnormalities.  Osseous demineralization with multilevel degenerative disc and facet disease changes of the cervical spine.  Scattered mild listheses as above, felt to be degenerative in origin.  No definite acute cervical spine abnormalities.  Emphysematous, bronchitic and interstitial changes at BILATERAL upper lobes.     Dimas Chyle, MD 12/19/2013, 8:36 PM PGY-1, [place] [facility] pager: [phone], text pages welcome  I have seen and examined the patient with Dr Jerline Pain and agree with his assessment and plan above, with my additions in blue. Hilton Sinclair, MD PGY-3, [place]

## 2013-12-19 NOTE — ED Notes (Signed)
Pt at ophthalmologist appointment- she turned and lost balance. [place] [address] hit her head and fell on right side. No LOC. Per EMS, pt has no deformity on right side, however, left hip hurts and has a deformity. Pt also has hematoma on back of head. Pt received 100 mcg of fentanyl from EMS and 451mL NS. BP 174/93, HR 91, 96% on room air

## 2013-12-19 NOTE — ED Provider Notes (Signed)
CSN: [id number]     Arrival date & time 12/19/13  1251 History   First MD Initiated Contact with Patient 12/19/13 1313     Chief Complaint  Patient presents with  . Fall     (Consider location/radiation/quality/duration/timing/severity/associated sxs/prior Treatment) HPI  [name] is a 80 y.o. female who presents for evaluation of left hip and head pain. She was at her doctor's office, when she turned and lost her balance. This caused her to fall and injure her left hip left knee and her head. She did not lose consciousness. She was treated seen by EMS and transferred here with partial immobilization. She is wearing a KED, settled low , onto the pelvis. She's not had the recent illnesses. She denies chest pain, shortness of breath, cough, weakness, or dizziness. She was treated on the way here, by EMS, with fentanyl, with partial relief of her pain. There are no other known modifying factors.  Past Medical History  Diagnosis Date  . Nonischemic cardiomyopathy     Cath without coronary disease 2006  . Systolic CHF   . Hyperlipidemia   . Hypertension   . Osteoporosis   . Urge incontinence   . Raynaud phenomenon   . PAF (paroxysmal atrial fibrillation)     Short bursts of PAF. Asymptomatic.  Sees Dr Rex Kras.  . Biventricular ICD (implantable cardioverter-defibrillator)-St. Jude     Date of implant 2007 generator change 2012  . Sinus tachycardia   . Macro dislodgment of the left ventricular lead 09/09/2012   Past Surgical History  Procedure Laterality Date  . Appendectomy    . Biventricular icd implant  06/28/2010; 03/28/2013    STJ CRTD implanted 2012 with dislodgement of LV lead 2014; 03/28/2013 CRTP placed with new LV lead by Dr Caryl Comes  . Bladder tack    . Cardiac catheterization  03/2005    EF 25%, Normal arteries, Nonischemic CM  . Abdominal hysterectomy  1970s  . Suprapubic urethrovesicular suspension  1992   Family History  Problem Relation Age of Onset  . Heart disease  Mother   . Heart disease Father   . Cancer Father     Kidney  . Heart disease Brother   . Diabetes Son    History  Substance Use Topics  . Smoking status: Former Smoker    Quit date: 04/22/2002  . Smokeless tobacco: Never Used  . 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   OB History   Grav Para Term Preterm Abortions TAB SAB Ect Mult Living                 Review of Systems  All other systems reviewed and are negative.     Allergies  Sulfamethoxazole  Home Medications   Prior to Admission medications   Medication Sig Start Date End Date Taking? Authorizing Provider  acetaminophen (TYLENOL) 650 MG CR tablet Take 1,300 mg by mouth at bedtime.    Yes Historical Provider, MD  aspirin 81 MG EC tablet Take 81 mg by mouth daily.    Yes Historical Provider, MD  carvedilol (COREG) 3.125 MG tablet Take 3.125 mg by mouth 2 (two) times daily with a meal. 03/29/13  Yes Historical Provider, MD  Cholecalciferol (VITAMIN D-3) 1000 UNITS CAPS Take 1 capsule by mouth daily.   Yes Historical Provider, MD  Cranberry (SM CRANBERRY) 300 MG tablet Take 600 mg by mouth 2 (two) times daily.    Yes Historical Provider,  MD  digoxin (LANOXIN) 0.125 MG tablet Take 1 tablet (0.125 mg total) by mouth daily. 09/22/13  Yes Deboraha Sprang, MD  furosemide (LASIX) 40 MG tablet Take 1 tablet (40 mg total) by mouth daily as needed for fluid. 09/22/13  Yes Deboraha Sprang, MD  losartan (COZAAR) 25 MG tablet Take 25 mg by mouth daily. 04/07/13  Yes Deboraha Sprang, MD   BP 158/65  Pulse 101  Temp(Src) 98.2 F (36.8 C) (Oral)  Resp 18  Ht 5\' 5"  (1.651 m)  Wt 108 lb (48.988 kg)  BMI 17.97 kg/m2  SpO2 95% Physical Exam  Nursing note and vitals reviewed. Constitutional: She is oriented to person, place, and time. She appears well-developed.  Elderly, frail  HENT:  Head: Normocephalic and atraumatic.  Head tender posteriorly, without significant swelling. No abrasion or  laceration  Eyes: Conjunctivae and EOM are normal. Pupils are equal, round, and reactive to light.  Neck: Normal range of motion and phonation normal. Neck supple.  Cardiovascular: Normal rate and regular rhythm.   Pulmonary/Chest: Effort normal and breath sounds normal. She exhibits no tenderness.  Abdominal: Soft. She exhibits no distension. There is no tenderness. There is no guarding.  Musculoskeletal: Normal range of motion.  Cervical spine with palpable tenderness of the spinous processes, and there is no step-off. Left leg is shortened and externally rotated. There is a deformity, at the left hip.  Neurological: She is alert and oriented to person, place, and time. She exhibits normal muscle tone.  Skin: Skin is warm and dry.  Psychiatric: She has a normal mood and affect. Her behavior is normal. Judgment and thought content normal.    ED Course  Procedures (including critical care time)  Medications  fentaNYL (SUBLIMAZE) injection 100 mcg (100 mcg Intravenous Given 12/19/13 1631)  sodium chloride 0.9 % bolus 500 mL (not administered)  0.9 %  sodium chloride infusion (not administered)  HYDROmorphone (DILAUDID) injection 0.5 mg (not administered)    Patient Vitals for the past 24 hrs:  BP Temp Temp src Pulse Resp SpO2 Height Weight  12/19/13 1609 158/65 mmHg - - 101 18 - - -  12/19/13 1258 147/72 mmHg 98.2 F (36.8 C) Oral 89 20 95 % 5\' 5"  (1.651 m) 108 lb (48.988 kg)   4:38 PM-Consult complete with FPTS Resident. Patient case explained and discussed. He agrees to admit patient for further evaluation and treatment. Call ended at 16:40   Consult Ortho for repair- Dr. Marlou Sa  CRITICAL CARE Performed by: Richarda Blade Total critical care time: 35 minutes Critical care time was exclusive of separately billable procedures and treating other patients. Critical care was necessary to treat or prevent imminent or life-threatening deterioration. Critical care was time spent  personally by me on the following activities: development of treatment plan with patient and/or surrogate as well as nursing, discussions with consultants, evaluation of patient's response to treatment, examination of patient, obtaining history from patient or surrogate, ordering and performing treatments and interventions, ordering and review of laboratory studies, ordering and review of radiographic studies, pulse oximetry and re-evaluation of patient's condition.  16:45 PM Reevaluation with update and discussion. After initial assessment and treatment, an updated evaluation reveals still having pain, relative hypotension after pain meds, Fluid bolus ordered. Pt is mentating well. Findings discussed with Pt and family members, all questions answered.Daleen Bo L    Labs Review Labs Reviewed  CBC WITH DIFFERENTIAL - Abnormal; Notable for the following:    Platelets 144 (*)  All other components within normal limits  COMPREHENSIVE METABOLIC PANEL - Abnormal; Notable for the following:    Glucose, Bld 116 (*)    Albumin 3.4 (*)    GFR calc non Af Amer 80 (*)    All other components within normal limits  DIGOXIN LEVEL - Abnormal; Notable for the following:    Digoxin Level <0.3 (*)    All other components within normal limits  URINE CULTURE  URINALYSIS, ROUTINE W REFLEX MICROSCOPIC  TYPE AND SCREEN  ABO/RH    Imaging Review Dg Hip Bilateral W/pelvis  12/19/2013   CLINICAL DATA:  LEFT hip pain post fall today  EXAM: BILATERAL HIP WITH PELVIS - 4+ VIEW  COMPARISON:  CT abdomen and pelvis 09/28/2008  FINDINGS: Osseous demineralization.  Displaced LEFT femoral neck fracture.  No dislocation.  Hip and SI joint spaces preserved.  Foreign body projects over pelvis.  SI joints symmetric.  Degenerative disc disease changes lumbar spine.  IMPRESSION: Displaced LEFT femoral neck fracture.  Osseous demineralization.   Electronically Signed   By: Lavonia Dana M.D.   On: 12/19/2013 16:00   Ct Head  Wo Contrast  12/19/2013   CLINICAL DATA:  [address] [address] at [employer] office injuring LEFT side, struck LEFT side of head, denies headache and neck pain, past history of CHF, non ischemic cardiomyopathy, hypertension  EXAM: CT HEAD WITHOUT CONTRAST  CT CERVICAL SPINE WITHOUT CONTRAST  TECHNIQUE: Multidetector CT imaging of the head and cervical spine was performed following the standard protocol without intravenous contrast. Multiplanar CT image reconstructions of the cervical spine were also generated.  COMPARISON:  None  FINDINGS: CT HEAD FINDINGS  Generalized atrophy.  Normal ventricular morphology.  No midline shift or mass effect.  Small vessel chronic ischemic changes of deep cerebral white matter.  No intracranial hemorrhage, mass lesion, or acute infarction.  Visualized paranasal sinuses and mastoid air cells clear.  Bones unremarkable.  CT CERVICAL SPINE FINDINGS  Osseous demineralization.  Visualized skullbase intact.  Beam hardening artifacts of dental origin.  Atherosclerotic calcifications of the LEFT carotid system and aortic arch.  Peribronchial thickening with emphysematous changes and interstitial/septal thickening in the upper lobes.  Prevertebral soft tissues normal thickness.  Disc space narrowing with endplate spur formation throughout cervical region.  Retrolisthesis at C4-C5 and minimally C3-C4.  Mild anterolisthesis at C6-C7.  Vertebral body heights maintained without fracture or subluxation.  Scattered facet degenerative changes.  IMPRESSION: Atrophy with small vessel chronic ischemic changes of deep cerebral white matter.  No acute intracranial abnormalities.  Osseous demineralization with multilevel degenerative disc and facet disease changes of the cervical spine.  Scattered mild listheses as above, felt to be degenerative in origin.  No definite acute cervical spine abnormalities.  Emphysematous, bronchitic and interstitial changes at BILATERAL upper lobes.   Electronically Signed   By: Lavonia Dana M.D.   On: 12/19/2013 15:35   Ct Cervical Spine Wo Contrast  12/19/2013   CLINICAL DATA:  [address] at [address] office injuring LEFT side, struck LEFT side of head, denies headache and neck pain, past history of CHF, non ischemic cardiomyopathy, hypertension  EXAM: CT HEAD WITHOUT CONTRAST  CT CERVICAL SPINE WITHOUT CONTRAST  TECHNIQUE: Multidetector CT imaging of the head and cervical spine was performed following the standard protocol without intravenous contrast. Multiplanar CT image reconstructions of the cervical spine were also generated.  COMPARISON:  None  FINDINGS: CT HEAD FINDINGS  Generalized atrophy.  Normal ventricular morphology.  No midline shift or mass effect.  Small  vessel chronic ischemic changes of deep cerebral white matter.  No intracranial hemorrhage, mass lesion, or acute infarction.  Visualized paranasal sinuses and mastoid air cells clear.  Bones unremarkable.  CT CERVICAL SPINE FINDINGS  Osseous demineralization.  Visualized skullbase intact.  Beam hardening artifacts of dental origin.  Atherosclerotic calcifications of the LEFT carotid system and aortic arch.  Peribronchial thickening with emphysematous changes and interstitial/septal thickening in the upper lobes.  Prevertebral soft tissues normal thickness.  Disc space narrowing with endplate spur formation throughout cervical region.  Retrolisthesis at C4-C5 and minimally C3-C4.  Mild anterolisthesis at C6-C7.  Vertebral body heights maintained without fracture or subluxation.  Scattered facet degenerative changes.  IMPRESSION: Atrophy with small vessel chronic ischemic changes of deep cerebral white matter.  No acute intracranial abnormalities.  Osseous demineralization with multilevel degenerative disc and facet disease changes of the cervical spine.  Scattered mild listheses as above, felt to be degenerative in origin.  No definite acute cervical spine abnormalities.  Emphysematous, bronchitic and interstitial changes at  BILATERAL upper lobes.   Electronically Signed   By: Lavonia Dana M.D.   On: 12/19/2013 15:35     EKG Interpretation   Date/Time:  Monday December 19 2013 16:12:53 EDT Ventricular Rate:  98 PR Interval:  215 QRS Duration: 142 QT Interval:  410 QTC Calculation: 523 R Axis:   62 Text Interpretation:  Atrial-sensed ventricular-paced complexes No further  analysis attempted due to paced rhythm Confirmed by POLLINA  MD,  [place] [phone]) on 12/19/2013 4:17:37 PM      MDM   Final diagnoses:  Hip fracture, left, closed, initial encounter  Other specified hypotension    Hip fracture secondary to fall, likely mechanical etiology. She developed pain in her left knee, which caused her to fall. Hypotension during treatment in the ED, and required IV fluid replacement. She may be mildly dehydrated. There is no evidence for serious bacterial infection or metabolic instability.  Nursing Notes Reviewed/ Care Coordinated, and agree without changes. Applicable Imaging Reviewed.  Interpretation of Laboratory Data incorporated into ED treatment  Plan: Admit    Richarda Blade, MD 12/19/13 1657

## 2013-12-19 NOTE — ED Notes (Signed)
Pt not in room at this time

## 2013-12-20 ENCOUNTER — Inpatient Hospital Stay (HOSPITAL_COMMUNITY): Payer: Medicare HMO

## 2013-12-20 ENCOUNTER — Inpatient Hospital Stay (HOSPITAL_COMMUNITY): Payer: Medicare HMO | Admitting: Anesthesiology

## 2013-12-20 ENCOUNTER — Encounter (HOSPITAL_COMMUNITY)
Admission: EM | Disposition: A | Discharge status: Skilled Nursing Facility | Payer: Commercial Managed Care - HMO | Source: Home / Self Care | Attending: Orthopedic Surgery

## 2013-12-20 ENCOUNTER — Encounter (HOSPITAL_COMMUNITY): Payer: Self-pay | Admitting: Anesthesiology

## 2013-12-20 ENCOUNTER — Encounter (HOSPITAL_COMMUNITY): Payer: Medicare HMO | Admitting: Anesthesiology

## 2013-12-20 DIAGNOSIS — S72009A Fracture of unspecified part of neck of unspecified femur, initial encounter for closed fracture: Principal | ICD-10-CM

## 2013-12-20 DIAGNOSIS — I428 Other cardiomyopathies: Secondary | ICD-10-CM

## 2013-12-20 DIAGNOSIS — E785 Hyperlipidemia, unspecified: Secondary | ICD-10-CM

## 2013-12-20 DIAGNOSIS — M810 Age-related osteoporosis without current pathological fracture: Secondary | ICD-10-CM

## 2013-12-20 DIAGNOSIS — I5022 Chronic systolic (congestive) heart failure: Secondary | ICD-10-CM

## 2013-12-20 DIAGNOSIS — I10 Essential (primary) hypertension: Secondary | ICD-10-CM

## 2013-12-20 HISTORY — PX: HIP ARTHROPLASTY: SHX981

## 2013-12-20 LAB — BASIC METABOLIC PANEL
Anion gap: 11 (ref 5–15)
BUN: 9 mg/dL (ref 6–23)
CALCIUM: 8.2 mg/dL — AB (ref 8.4–10.5)
CHLORIDE: 102 meq/L (ref 96–112)
CO2: 23 meq/L (ref 19–32)
Creatinine, Ser: 0.65 mg/dL (ref 0.50–1.10)
GFR calc Af Amer: >90 mL/min (ref >90)
GFR calc non Af Amer: 81 mL/min — ABNORMAL LOW (ref >90)
Glucose, Bld: 112 mg/dL — ABNORMAL HIGH (ref 70–99)
Potassium: 3.8 mEq/L (ref 3.7–5.3)
SODIUM: 136 meq/L — AB (ref 137–147)

## 2013-12-20 LAB — CBC
HEMATOCRIT: 38.9 % (ref 36.0–46.0)
Hemoglobin: 13.1 g/dL (ref 12.0–15.0)
MCH: 32 pg (ref 26.0–34.0)
MCHC: 33.7 g/dL (ref 30.0–36.0)
MCV: 94.9 fL (ref 78.0–100.0)
Platelets: 110 10*3/uL — ABNORMAL LOW (ref 150–400)
RBC: 4.1 MIL/uL (ref 3.87–5.11)
RDW: 13.3 % (ref 11.5–15.5)
WBC: 7.5 10*3/uL (ref 4.0–10.5)

## 2013-12-20 SURGERY — HEMIARTHROPLASTY, HIP, DIRECT ANTERIOR APPROACH, FOR FRACTURE
Anesthesia: Spinal | Site: Hip | Laterality: Left

## 2013-12-20 MED ORDER — PROPOFOL 10 MG/ML IV BOLUS
INTRAVENOUS | Status: AC
  Filled 2013-12-20: qty 20

## 2013-12-20 MED ORDER — HYDROCODONE-ACETAMINOPHEN 5-325 MG PO TABS
1.0000 | ORAL_TABLET | Freq: Four times a day (QID) | ORAL | Status: DC | PRN
  Administered 2013-12-21 (×2): 2 via ORAL
  Filled 2013-12-20 (×2): qty 2

## 2013-12-20 MED ORDER — WARFARIN - PHARMACIST DOSING INPATIENT: Freq: Every day | Status: DC

## 2013-12-20 MED ORDER — OXYCODONE HCL 5 MG/5ML PO SOLN: 5.0000 mg | Freq: Once | ORAL | Status: DC | PRN

## 2013-12-20 MED ORDER — ACETAMINOPHEN 650 MG RE SUPP: 650.0000 mg | Freq: Four times a day (QID) | RECTAL | Status: DC | PRN

## 2013-12-20 MED ORDER — INFLUENZA VAC SPLIT QUAD 0.5 ML IM SUSY
0.5000 mL | PREFILLED_SYRINGE | INTRAMUSCULAR | Status: DC
  Filled 2013-12-20: qty 0.5

## 2013-12-20 MED ORDER — METOCLOPRAMIDE HCL 10 MG PO TABS: 5.0000 mg | ORAL_TABLET | Freq: Three times a day (TID) | ORAL | Status: DC | PRN

## 2013-12-20 MED ORDER — PROPOFOL INFUSION 10 MG/ML OPTIME
INTRAVENOUS | Status: DC | PRN
  Administered 2013-12-20: 25 ug/kg/min via INTRAVENOUS

## 2013-12-20 MED ORDER — 0.9 % SODIUM CHLORIDE (POUR BTL) OPTIME
TOPICAL | Status: DC | PRN
  Administered 2013-12-20: 1000 mL

## 2013-12-20 MED ORDER — ONDANSETRON HCL 4 MG PO TABS: 4.0000 mg | ORAL_TABLET | Freq: Four times a day (QID) | ORAL | Status: DC | PRN

## 2013-12-20 MED ORDER — PHENOL 1.4 % MT LIQD: 1.0000 | OROMUCOSAL | Status: DC | PRN

## 2013-12-20 MED ORDER — CALCIUM CITRATE 950 (200 CA) MG PO TABS
200.0000 mg | ORAL_TABLET | Freq: Two times a day (BID) | ORAL | Status: DC
  Administered 2013-12-21 – 2013-12-22 (×3): 200 mg via ORAL
  Filled 2013-12-20 (×6): qty 1

## 2013-12-20 MED ORDER — POTASSIUM CHLORIDE IN NACL 20-0.9 MEQ/L-% IV SOLN
INTRAVENOUS | Status: DC
  Administered 2013-12-20 – 2013-12-21 (×2): via INTRAVENOUS
  Filled 2013-12-20 (×4): qty 1000

## 2013-12-20 MED ORDER — HYDROMORPHONE HCL 1 MG/ML IJ SOLN
1.0000 mg | INTRAMUSCULAR | Status: DC | PRN
  Administered 2013-12-20 (×3): 1 mg via INTRAVENOUS
  Filled 2013-12-20 (×3): qty 1

## 2013-12-20 MED ORDER — MENTHOL 3 MG MT LOZG: 1.0000 | LOZENGE | OROMUCOSAL | Status: DC | PRN

## 2013-12-20 MED ORDER — CYCLOBENZAPRINE HCL 10 MG PO TABS: 5.0000 mg | ORAL_TABLET | Freq: Three times a day (TID) | ORAL | Status: DC | PRN

## 2013-12-20 MED ORDER — MIDAZOLAM HCL 2 MG/2ML IJ SOLN
INTRAMUSCULAR | Status: AC
  Filled 2013-12-20: qty 2

## 2013-12-20 MED ORDER — SODIUM CHLORIDE 0.9 % IV SOLN
INTRAVENOUS | Status: DC | PRN
Start: 2013-12-20 — End: 2013-12-20
  Administered 2013-12-20: 20:00:00 via INTRAVENOUS

## 2013-12-20 MED ORDER — BUPIVACAINE HCL (PF) 0.5 % IJ SOLN
INTRAMUSCULAR | Status: DC | PRN
  Administered 2013-12-20: 3 mL

## 2013-12-20 MED ORDER — MORPHINE SULFATE 2 MG/ML IJ SOLN: 0.5000 mg | INTRAMUSCULAR | Status: DC | PRN

## 2013-12-20 MED ORDER — FENTANYL CITRATE 0.05 MG/ML IJ SOLN
INTRAMUSCULAR | Status: AC
  Filled 2013-12-20: qty 5

## 2013-12-20 MED ORDER — WARFARIN SODIUM 5 MG PO TABS
5.0000 mg | ORAL_TABLET | Freq: Once | ORAL | Status: AC
  Administered 2013-12-21: 5 mg via ORAL
  Filled 2013-12-20: qty 1

## 2013-12-20 MED ORDER — FENTANYL CITRATE 0.05 MG/ML IJ SOLN: 25.0000 ug | INTRAMUSCULAR | Status: DC | PRN

## 2013-12-20 MED ORDER — METOCLOPRAMIDE HCL 5 MG/ML IJ SOLN: 5.0000 mg | Freq: Three times a day (TID) | INTRAMUSCULAR | Status: DC | PRN

## 2013-12-20 MED ORDER — ACETAMINOPHEN 325 MG PO TABS: 325.0000 mg | ORAL_TABLET | ORAL | Status: DC | PRN

## 2013-12-20 MED ORDER — ACETAMINOPHEN 160 MG/5ML PO SOLN
325.0000 mg | ORAL | Status: DC | PRN
  Filled 2013-12-20: qty 20.3

## 2013-12-20 MED ORDER — NEOSTIGMINE METHYLSULFATE 10 MG/10ML IV SOLN
INTRAVENOUS | Status: AC
  Filled 2013-12-20: qty 3

## 2013-12-20 MED ORDER — FENTANYL CITRATE 0.05 MG/ML IJ SOLN
INTRAMUSCULAR | Status: DC | PRN
  Administered 2013-12-20 (×10): 25 ug via INTRAVENOUS

## 2013-12-20 MED ORDER — VITAMIN D3 25 MCG (1000 UNIT) PO TABS
2000.0000 [IU] | ORAL_TABLET | Freq: Every day | ORAL | Status: DC
  Administered 2013-12-21 – 2013-12-22 (×2): 2000 [IU] via ORAL
  Filled 2013-12-20 (×3): qty 2

## 2013-12-20 MED ORDER — CEFAZOLIN SODIUM-DEXTROSE 2-3 GM-% IV SOLR
2.0000 g | Freq: Four times a day (QID) | INTRAVENOUS | Status: AC
  Administered 2013-12-21 (×2): 2 g via INTRAVENOUS
  Filled 2013-12-20 (×2): qty 50

## 2013-12-20 MED ORDER — COUMADIN BOOK
Freq: Once | Status: AC
  Administered 2013-12-21
  Filled 2013-12-20: qty 1

## 2013-12-20 MED ORDER — ACETAMINOPHEN 325 MG PO TABS: 650.0000 mg | ORAL_TABLET | Freq: Four times a day (QID) | ORAL | Status: DC | PRN

## 2013-12-20 MED ORDER — ONDANSETRON HCL 4 MG/2ML IJ SOLN: 4.0000 mg | Freq: Four times a day (QID) | INTRAMUSCULAR | Status: DC | PRN

## 2013-12-20 MED ORDER — OXYCODONE HCL 5 MG PO TABS: 5.0000 mg | ORAL_TABLET | Freq: Once | ORAL | Status: DC | PRN

## 2013-12-20 MED ORDER — ETOMIDATE 2 MG/ML IV SOLN
INTRAVENOUS | Status: AC
  Filled 2013-12-20: qty 10

## 2013-12-20 MED ORDER — CEFAZOLIN SODIUM 1-5 GM-% IV SOLN
1.0000 g | Freq: Once | INTRAVENOUS | Status: AC
  Administered 2013-12-20: 1 g via INTRAVENOUS
  Filled 2013-12-20: qty 50

## 2013-12-20 MED ORDER — LACTATED RINGERS IV SOLN
INTRAVENOUS | Status: DC | PRN
  Administered 2013-12-20: 21:00:00 via INTRAVENOUS

## 2013-12-20 SURGICAL SUPPLY — 50 items
BLADE SAW RECIP 87.9 MT (BLADE) ×3 IMPLANT
BLADE SAW SAG 73X25 THK (BLADE) ×2
BLADE SAW SGTL 73X25 THK (BLADE) ×1 IMPLANT
BRUSH FEMORAL CANAL (MISCELLANEOUS) IMPLANT
CAPT HIP HD POR BIPOL/UNIPOL ×3 IMPLANT
DRAPE INCISE IOBAN 66X45 STRL (DRAPES) ×3 IMPLANT
DRAPE ORTHO SPLIT 77X108 STRL (DRAPES) ×4
DRAPE PROXIMA HALF (DRAPES) IMPLANT
DRAPE SURG 17X23 STRL (DRAPES) ×3 IMPLANT
DRAPE SURG ORHT 6 SPLT 77X108 (DRAPES) ×2 IMPLANT
DRAPE U-SHAPE 47X51 STRL (DRAPES) ×3 IMPLANT
DRILL BIT 1/8DIAX5INL DISPOSE (BIT) IMPLANT
DRSG AQUACEL AG ADV 3.5X14 (GAUZE/BANDAGES/DRESSINGS) ×3 IMPLANT
DURAPREP 26ML APPLICATOR (WOUND CARE) ×3 IMPLANT
ELECT BLADE 6.5 EXT (BLADE) IMPLANT
ELECT CAUTERY BLADE 6.4 (BLADE) ×3 IMPLANT
ELECT REM PT RETURN 9FT ADLT (ELECTROSURGICAL) ×3
ELECTRODE REM PT RTRN 9FT ADLT (ELECTROSURGICAL) ×1 IMPLANT
EVACUATOR 1/8 PVC DRAIN (DRAIN) IMPLANT
GLOVE BIOGEL PI IND STRL 8 (GLOVE) ×1 IMPLANT
GLOVE BIOGEL PI INDICATOR 8 (GLOVE) ×2
GLOVE SURG ORTHO 8.0 STRL STRW (GLOVE) ×6 IMPLANT
GOWN STRL REUS W/ TWL XL LVL3 (GOWN DISPOSABLE) ×3 IMPLANT
GOWN STRL REUS W/TWL XL LVL3 (GOWN DISPOSABLE) ×6
HANDPIECE INTERPULSE COAX TIP (DISPOSABLE)
HOOD PEEL AWAY FACE SHEILD DIS (HOOD) ×6 IMPLANT
KIT BASIN OR (CUSTOM PROCEDURE TRAY) ×3 IMPLANT
KIT ROOM TURNOVER OR (KITS) ×3 IMPLANT
MANIFOLD NEPTUNE II (INSTRUMENTS) ×3 IMPLANT
NDL SUT .5 MAYO 1.404X.05X (NEEDLE) IMPLANT
NEEDLE MAYO TAPER (NEEDLE)
NS IRRIG 1000ML POUR BTL (IV SOLUTION) ×3 IMPLANT
PACK TOTAL JOINT (CUSTOM PROCEDURE TRAY) ×3 IMPLANT
PAD ARMBOARD 7.5X6 YLW CONV (MISCELLANEOUS) ×6 IMPLANT
PASSER SUT SWANSON 36MM LOOP (INSTRUMENTS) IMPLANT
PILLOW ABDUCTION HIP (SOFTGOODS) IMPLANT
PIN STEINMAN 3/16 (PIN) IMPLANT
SET HNDPC FAN SPRY TIP SCT (DISPOSABLE) IMPLANT
STAPLER VISISTAT 35W (STAPLE) ×3 IMPLANT
SUCTION FRAZIER TIP 10 FR DISP (SUCTIONS) ×3 IMPLANT
SUT ETHIBOND NAB CT1 #1 30IN (SUTURE) ×9 IMPLANT
SUT VIC AB 0 CT1 27 (SUTURE) ×2
SUT VIC AB 0 CT1 27XBRD ANBCTR (SUTURE) ×1 IMPLANT
SUT VIC AB 1 CT1 27 (SUTURE) ×2
SUT VIC AB 1 CT1 27XBRD ANBCTR (SUTURE) ×1 IMPLANT
SUT VIC AB 2-0 CT1 27 (SUTURE) ×2
SUT VIC AB 2-0 CT1 TAPERPNT 27 (SUTURE) ×1 IMPLANT
TOWEL OR 17X24 6PK STRL BLUE (TOWEL DISPOSABLE) ×3 IMPLANT
TOWEL OR 17X26 10 PK STRL BLUE (TOWEL DISPOSABLE) ×3 IMPLANT
TOWER CARTRIDGE SMART MIX (DISPOSABLE) IMPLANT

## 2013-12-20 NOTE — Anesthesia Preprocedure Evaluation (Signed)
Anesthesia Evaluation  Patient identified by MRN, date of birth, ID band Patient awake    Reviewed: Allergy & Precautions, H&P , NPO status , Patient's Chart, lab work & pertinent test results, reviewed documented beta blocker date and time   History of Anesthesia Complications Negative for: history of anesthetic complications  Airway Mallampati: II TM Distance: >3 FB Neck ROM: Full    Dental  (+) Lower Dentures   Pulmonary neg shortness of breath, neg sleep apnea, neg COPDneg recent URI, former smoker,  breath sounds clear to auscultation        Cardiovascular hypertension, Pt. on medications and Pt. on home beta blockers +CHF + pacemaker + Cardiac Defibrillator Rhythm:Regular  St jude dual lead pacemaker, no icd (removed in past, RV pacer dependent   Neuro/Psych negative neurological ROS  negative psych ROS   GI/Hepatic negative GI ROS, Neg liver ROS,   Endo/Other  negative endocrine ROS  Renal/GU negative Renal ROS     Musculoskeletal  (+) Arthritis -, Osteoarthritis,  Left hip fracture   Abdominal   Peds  Hematology   Anesthesia Other Findings   Reproductive/Obstetrics                           Anesthesia Physical Anesthesia Plan  ASA: III  Anesthesia Plan: Spinal   Post-op Pain Management:    Induction:   Airway Management Planned: Natural Airway  Additional Equipment: None  Intra-op Plan:   Post-operative Plan:   Informed Consent: I have reviewed the patients History and Physical, chart, labs and discussed the procedure including the risks, benefits and alternatives for the proposed anesthesia with the patient or authorized representative who has indicated his/her understanding and acceptance.   Dental advisory given  Plan Discussed with: CRNA and Surgeon  Anesthesia Plan Comments:         Anesthesia Quick Evaluation

## 2013-12-20 NOTE — H&P (Signed)
Call Pager [phone] for any questions or notifications regarding this patient  FMTS Attending Admission Note: Dorcas Mcmurray MD Attending pager:[phone] I  have seen and examined this patient, reviewed their chart. I have discussed this patient with the resident. I agree with the resident's findings, assessment and care plan. I increased her break through pain medication slightly this morning but she is having moderate breakthrough pain only. Otherwise seems well and orthopedics plans surgery this PM.I reviewed her X rays with her and her daughter at bedside. Some tachycardia on admission (patient has an atrial sensed ventricular pacer) that I think was related to pain. Seems very perky this morning,.

## 2013-12-20 NOTE — Progress Notes (Signed)
Family Medicine Teaching Service Daily Progress Note Intern Pager: [phone]  Patient name: [name] Medical record number: [id number] Date of birth: [birth year] Age: 80 y.o. Gender: female  Primary Care Provider: Chrisandra Netters, MD Consultants: Orthopedics Code Status: DNR  Pt Overview and Major Events to Date:  9/21 - Admitted with left hip fracture  Assessment and Plan: [name] is a 80 y.o. female presenting with left hip fracture after suffering a mechanical fall . PMH is significant for systolic heart failure (with pacer, no AICD), NICM, HTN, PAF, osteoporosis, and HLD. Also with prolonged QTc on EKG and UTI.   #Left Hip Fracture s/p mechanical fall. Fall caused by left knee buckling (history of OA and prior episodes of buckling). No evidence for cardiac or neurologic etiologies. Neurovascularly intact in lower extremities bilaterally.  -Orthopedics consulted and called  -Pain management: IV Morphine 1mg  q2hrs and dilaudid 0.5mg  prn while NPO for surgery -Flexeril for muscle spasms -Continue to monitor for occult injuries that may be overlooked in setting of major fracture  -PT/OT consults for after surgery.   #Osteoporosis. Osseus demineralization on hip plain films. Will need BMD measurements as an outpatient and likely bisphosphonate therapy. -Home vitD to 2000U daily -Calcium citrate bid - f/u vit D levels  #Hypoxia, tachypnea. New oxygen requirement here. Potentially related to hyperventilation and poor inspiration secondary to acute pain. Lungs clear to auscultation and pt in no distress speaking in full sentences. Could also consider more serious etiologies such as PE or fat embolus (given additional finding of tachycardia) if patient does not improve.  - O2 as needed to keep O2 sats > 92%  - continuous pulseox  - Consider CXR or potentially CTA if not improving   #Systolic CHF (s/p pacemaker placement). Also with NICM. Followed by Dr Caryl Comes. Last EF showed EF  of 35-40% with mild MR. Recently discontinued digoxin.  -continue home coreg  -holding home cozaar, aspirin for now   #UTI. UA remarkable for nitrites and moderate leukocytes. Will hold off on treatment at this time as the patient is asymptomatic and is afebrile and has a normal WBC.  -f/u urine culture; if positive with no other criteria from [facility], would not tx.  -f/u CBC   #Prolonged QTc. 532ms on admission. 438ms three months ago. Normal K. No chest pain. No QT prolonging medications identified, though uncertain contribution from digoxin.  - f/u morning EKG  - f/u magnesium level   #HTN. Coreg as above. Holding cozaar.  #HLD. No statins listed on home medication list. Unsure if patient has been on in the past. Can consider adding on after surgery.   FEN/GI: NPO, Gentle IVF as needed Prophylaxis: SCDs, holding pharmacologic DVT prophylaxis in setting of anticipated surgery.  Disposition: Admitted to telemetry pending surgery later today and acute post-op management.  Subjective:  Pain better controlled this morning. Less muscles spasms with flexeril. No chest pain or shortness of breath.  Objective: Temp:  [98.1 F (36.7 C)-98.6 F (37 C)] 98.5 F (36.9 C) (09/22 0520) Pulse Rate:  [89-122] 100 (09/22 0520) Resp:  [18-21] 19 (09/22 0520) BP: (122-158)/(44-127) 126/66 mmHg (09/22 0520) SpO2:  [93 %-97 %] 95 % (09/22 0520) Weight:  [108 lb (48.988 kg)] 108 lb (48.988 kg) (09/21 1258) Physical Exam: General: Uncomfortably appearing but in NAD, elderly woman lying supine in bed with [place] in place (2L) Cardiovascular: Tachycardic, regular rhythm with occasional ectopic beats. No murmurs appreciated Respiratory: 2L [place] in place, speaking in complete sentences. Anterior  lung fields CTAB Abdomen: +BS, soft, nontender, nondistended Extremities: 1+ DP pulses bilaterally. Brisk capillary refill in feet bilaterally. Sensation and strength (plantar- and dorsiflexion) intact grossly in  feet bilaterally   Laboratory:  Recent Labs Lab 12/19/13 1414 12/20/13 0449  WBC 7.1 7.5  HGB 14.6 13.1  HCT 43.1 38.9  PLT 144* 110*    Recent Labs Lab 12/19/13 1414 12/20/13 0449  NA 140 136*  K 4.2 3.8  CL 103 102  CO2 24 23  BUN 16 9  CREATININE 0.68 0.65  CALCIUM 9.1 8.2*  PROT 6.4  --   BILITOT 0.4  --   ALKPHOS 67  --   ALT 13  --   AST 21  --   GLUCOSE 116* 112*    Imaging/Diagnostic Tests: Dg Hip Bilateral W/pelvis 12/19/2013  IMPRESSION: Displaced LEFT femoral neck fracture. Osseous demineralization.    Dimas Chyle, MD 12/20/2013, 9:20 AM PGY-1, [facility] Intern pager: [phone], text pages welcome

## 2013-12-20 NOTE — Anesthesia Procedure Notes (Addendum)
Procedure Name: MAC Date/Time: 12/20/2013 8:18 PM Performed by: Trixie Deis A Oxygen Delivery Method: Simple face mask Placement Confirmation: positive ETCO2   Spinal  Patient location during procedure: OR Start time: 12/20/2013 8:21 PM End time: 12/20/2013 8:24 PM Staffing Anesthesiologist: Ermalene Postin, CHRIS Preanesthetic Checklist Completed: patient identified, surgical consent, pre-op evaluation, timeout performed, IV checked, risks and benefits discussed and monitors and equipment checked Spinal Block Patient position: right lateral decubitus Prep: site prepped and draped and DuraPrep Patient monitoring: heart rate, cardiac monitor, continuous pulse ox and blood pressure Approach: midline Location: L4-5 Injection technique: single-shot Needle Needle type: Pencan  Needle gauge: 24 G Needle length: 10 cm Assessment Sensory level: T8

## 2013-12-20 NOTE — Brief Op Note (Signed)
12/19/2013 - 12/20/2013  10:09 PM  PATIENT:  [name]  80 y.o. female  PRE-OPERATIVE DIAGNOSIS:  LEFT HIP FX  POST-OPERATIVE DIAGNOSIS:  Left Hip Fx  PROCEDURE:  Procedure(s): LEFT HIP HEMI ARTHROPLASTY  SURGEON:  Surgeon(s): Meredith Pel, MD  ASSISTANT: b roberts pa  ANESTHESIA:   general  EBL: 100 ml    Total I/O In: 1000 [I.V.:1000] Out: 450 [Urine:450]  BLOOD ADMINISTERED: none  DRAINS: none   LOCAL MEDICATIONS USED:  none  SPECIMEN:  No Specimen  COUNTS:  YES  TOURNIQUET:  * No tourniquets in log *  DICTATION: .Other Dictation: Dictation Number [id number]  PLAN OF CARE: Admit to inpatient   PATIENT DISPOSITION:  PACU - hemodynamically stable

## 2013-12-20 NOTE — Progress Notes (Signed)
ANTICOAGULATION CONSULT NOTE - Initial Consult  Pharmacy Consult for Coumadin Indication: VTE prophylaxis  Allergies  Allergen Reactions  . Sulfamethoxazole     REACTION: unspecified    Patient Measurements: Height: 5\' 5"  (165.1 cm) Weight: 108 lb (48.988 kg) IBW/kg (Calculated) : 57  Vital Signs: Temp: 98.1 F (36.7 C) (09/22 2303) BP: 133/46 mmHg (09/22 2303) Pulse Rate: 101 (09/22 2303)  Labs:  Recent Labs  12/19/13 1414 12/20/13 0449  HGB 14.6 13.1  HCT 43.1 38.9  PLT 144* 110*  CREATININE 0.68 0.65    Estimated Creatinine Clearance: 42.7 ml/min (by C-G formula based on Cr of 0.65).   Medical History: Past Medical History  Diagnosis Date  . Nonischemic cardiomyopathy     Cath without coronary disease 2006  . Systolic CHF   . Hyperlipidemia   . Hypertension   . Osteoporosis   . Urge incontinence   . Raynaud phenomenon   . PAF (paroxysmal atrial fibrillation)     Short bursts of PAF. Asymptomatic.  Sees Dr Rex Kras.  . Biventricular ICD (implantable cardioverter-defibrillator)-[employer] Jude     Date of implant 2007 generator change 2012  . Sinus tachycardia   . Macro dislodgment of the left ventricular lead 09/09/2012    Medications:  Prescriptions prior to admission  Medication Sig Dispense Refill  . acetaminophen (TYLENOL) 650 MG CR tablet Take 1,300 mg by mouth at bedtime.       Marland Kitchen aspirin 81 MG EC tablet Take 81 mg by mouth daily.       . carvedilol (COREG) 3.125 MG tablet Take 3.125 mg by mouth 2 (two) times daily with a meal.      . Cholecalciferol (VITAMIN D-3) 1000 UNITS CAPS Take 1 capsule by mouth daily.      . Cranberry (SM CRANBERRY) 300 MG tablet Take 600 mg by mouth 2 (two) times daily.       . digoxin (LANOXIN) 0.125 MG tablet Take 1 tablet (0.125 mg total) by mouth daily.  30 tablet  6  . furosemide (LASIX) 40 MG tablet Take 1 tablet (40 mg total) by mouth daily as needed for fluid.  30 tablet  3  . losartan (COZAAR) 25 MG tablet Take 25 mg  by mouth daily.        Assessment: 80 yo female s/p L hip hemi arthroplasty for Coumadin  Goal of Therapy:  INR 2-3 Monitor platelets by anticoagulation protocol: Yes   Plan:  Coumadin 5 mg tonight Daily INR  Caryl Pina 12/20/2013,11:43 PM

## 2013-12-20 NOTE — Transfer of Care (Signed)
Immediate Anesthesia Transfer of Care Note  Patient: [name]  Procedure(s) Performed: Procedure(s): LEFT HIP HEMI ARTHROPLASTY (Left)  Patient Location: PACU  Anesthesia Type:MAC and Spinal  Level of Consciousness: awake, alert  and oriented  Airway & Oxygen Therapy: Patient Spontanous Breathing and Patient connected to nasal cannula oxygen  Post-op Assessment: Report given to PACU RN and Post -op Vital signs reviewed and stable  Post vital signs: Reviewed and stable  Complications: No apparent anesthesia complications

## 2013-12-20 NOTE — Progress Notes (Signed)
Utilization review completed.  

## 2013-12-20 NOTE — Consult Note (Signed)
Reason for Consult: Left hip pain Referring Physician: Dr. [name] is an 80 y.o. female.  HPI: And 5 he is an 80 year old female with left hip pain. She sustained a mechanical fall earlier today without loss of consciousness. She reports inability to angulate. Denies any other orthopedic complaints. She has multiple tooth other medical problems. She states her 56 year old disabled son at home. She is very active.  Past Medical History  Diagnosis Date  . Nonischemic cardiomyopathy     Cath without coronary disease 2006  . Systolic CHF   . Hyperlipidemia   . Hypertension   . Osteoporosis   . Urge incontinence   . Raynaud phenomenon   . PAF (paroxysmal atrial fibrillation)     Short bursts of PAF. Asymptomatic.  Sees Dr [name].  . Biventricular ICD (implantable cardioverter-defibrillator)-[employer] Jude     Date of implant 2007 generator change 2012  . Sinus tachycardia   . Macro dislodgment of the left ventricular lead 09/09/2012    Past Surgical History  Procedure Laterality Date  . Appendectomy    . Biventricular icd implant  06/28/2010; 03/28/2013    STJ CRTD implanted 2012 with dislodgement of LV lead 2014; 03/28/2013 CRTP placed with new LV lead by Dr [name]  . Bladder tack    . Cardiac catheterization  03/2005    EF 25%, Normal arteries, Nonischemic CM  . Abdominal hysterectomy  1970s  . Suprapubic urethrovesicular suspension  1992    Family History  Problem Relation Age of Onset  . Heart disease Mother   . Heart disease Father   . Cancer Father     Kidney  . Heart disease Brother   . Diabetes Son     Social History:  reports that she quit smoking about 11 years ago. She has never used smokeless tobacco. She reports that she does not drink alcohol or use illicit drugs.  Allergies:  Allergies  Allergen Reactions  . Sulfamethoxazole     REACTION: unspecified    Medications: I have reviewed the patient's current medications.  Results for orders placed during  the hospital encounter of 12/19/13 (from the past 48 hour(s))  CBC WITH DIFFERENTIAL     Status: Abnormal   Collection Time    12/19/13  2:14 PM      Result Value Ref Range   WBC 7.1  4.0 - 10.5 K/uL   RBC 4.56  3.87 - 5.11 MIL/uL   Hemoglobin 14.6  12.0 - 15.0 g/dL   HCT 43.1  36.0 - 46.0 %   MCV 94.5  78.0 - 100.0 fL   MCH 32.0  26.0 - 34.0 pg   MCHC 33.9  30.0 - 36.0 g/dL   RDW 13.3  11.5 - 15.5 %   Platelets 144 (*) 150 - 400 K/uL   Neutrophils Relative % 71  43 - 77 %   Neutro Abs 5.1  1.7 - 7.7 K/uL   Lymphocytes Relative 19  12 - 46 %   Lymphs Abs 1.4  0.7 - 4.0 K/uL   Monocytes Relative 7  3 - 12 %   Monocytes Absolute 0.5  0.1 - 1.0 K/uL   Eosinophils Relative 2  0 - 5 %   Eosinophils Absolute 0.1  0.0 - 0.7 K/uL   Basophils Relative 1  0 - 1 %   Basophils Absolute 0.0  0.0 - 0.1 K/uL  COMPREHENSIVE METABOLIC PANEL     Status: Abnormal   Collection Time  12/19/13  2:14 PM      Result Value Ref Range   Sodium 140  137 - 147 mEq/L   Potassium 4.2  3.7 - 5.3 mEq/L   Chloride 103  96 - 112 mEq/L   CO2 24  19 - 32 mEq/L   Glucose, Bld 116 (*) 70 - 99 mg/dL   BUN 16  6 - 23 mg/dL   Creatinine, Ser 0.68  0.50 - 1.10 mg/dL   Calcium 9.1  8.4 - 10.5 mg/dL   Total Protein 6.4  6.0 - 8.3 g/dL   Albumin 3.4 (*) 3.5 - 5.2 g/dL   AST 21  0 - 37 U/L   ALT 13  0 - 35 U/L   Alkaline Phosphatase 67  39 - 117 U/L   Total Bilirubin 0.4  0.3 - 1.2 mg/dL   GFR calc non Af Amer 80 (*) >90 mL/min   GFR calc Af Amer >90  >90 mL/min   Comment: (NOTE)     The eGFR has been calculated using the CKD EPI equation.     This calculation has not been validated in all clinical situations.     eGFR's persistently <90 mL/min signify possible Chronic Kidney     Disease.   Anion gap 13  5 - 15  TYPE AND SCREEN     Status: None   Collection Time    12/19/13  2:14 PM      Result Value Ref Range   ABO/RH(D) O POS     Antibody Screen NEG     Sample Expiration 12/22/2013    DIGOXIN LEVEL      Status: Abnormal   Collection Time    12/19/13  2:14 PM      Result Value Ref Range   Digoxin Level <0.3 (*) 0.8 - 2.0 ng/mL  ABO/RH     Status: None   Collection Time    12/19/13  2:40 PM      Result Value Ref Range   ABO/RH(D) O POS    URINALYSIS, ROUTINE W REFLEX MICROSCOPIC     Status: Abnormal   Collection Time    12/19/13  5:59 PM      Result Value Ref Range   Color, Urine YELLOW  YELLOW   APPearance CLEAR  CLEAR   Specific Gravity, Urine 1.014  1.005 - 1.030   pH 5.5  5.0 - 8.0   Glucose, UA NEGATIVE  NEGATIVE mg/dL   Hgb urine dipstick TRACE (*) NEGATIVE   Bilirubin Urine NEGATIVE  NEGATIVE   Ketones, ur 15 (*) NEGATIVE mg/dL   Protein, ur NEGATIVE  NEGATIVE mg/dL   Urobilinogen, UA 0.2  0.0 - 1.0 mg/dL   Nitrite POSITIVE (*) NEGATIVE   Leukocytes, UA MODERATE (*) NEGATIVE  URINE MICROSCOPIC-ADD ON     Status: Abnormal   Collection Time    12/19/13  5:59 PM      Result Value Ref Range   Squamous Epithelial / LPF RARE  RARE   WBC, UA 11-20  <3 WBC/hpf   RBC / HPF 0-2  <3 RBC/hpf   Bacteria, UA FEW (*) RARE  MAGNESIUM     Status: None   Collection Time    12/19/13  9:26 PM      Result Value Ref Range   Magnesium 1.6  1.5 - 2.5 mg/dL    Dg Hip Bilateral W/pelvis  12/19/2013   CLINICAL DATA:  LEFT hip pain post fall today  EXAM: BILATERAL HIP WITH PELVIS -  4+ VIEW  COMPARISON:  CT abdomen and pelvis 09/28/2008  FINDINGS: Osseous demineralization.  Displaced LEFT femoral neck fracture.  No dislocation.  Hip and SI joint spaces preserved.  Foreign body projects over pelvis.  SI joints symmetric.  Degenerative disc disease changes lumbar spine.  IMPRESSION: Displaced LEFT femoral neck fracture.  Osseous demineralization.   Electronically Signed   By: [name] M.D.   On: 12/19/2013 16:00   Ct Head Wo Contrast  12/19/2013   CLINICAL DATA:  [address] [address] at [place] office injuring LEFT side, struck LEFT side of head, denies headache and neck pain, past history of CHF, non  ischemic cardiomyopathy, hypertension  EXAM: CT HEAD WITHOUT CONTRAST  CT CERVICAL SPINE WITHOUT CONTRAST  TECHNIQUE: Multidetector CT imaging of the head and cervical spine was performed following the standard protocol without intravenous contrast. Multiplanar CT image reconstructions of the cervical spine were also generated.  COMPARISON:  None  FINDINGS: CT HEAD FINDINGS  Generalized atrophy.  Normal ventricular morphology.  No midline shift or mass effect.  Small vessel chronic ischemic changes of deep cerebral white matter.  No intracranial hemorrhage, mass lesion, or acute infarction.  Visualized paranasal sinuses and mastoid air cells clear.  Bones unremarkable.  CT CERVICAL SPINE FINDINGS  Osseous demineralization.  Visualized skullbase intact.  Beam hardening artifacts of dental origin.  Atherosclerotic calcifications of the LEFT carotid system and aortic arch.  Peribronchial thickening with emphysematous changes and interstitial/septal thickening in the upper lobes.  Prevertebral soft tissues normal thickness.  Disc space narrowing with endplate spur formation throughout cervical region.  Retrolisthesis at C4-C5 and minimally C3-C4.  Mild anterolisthesis at C6-C7.  Vertebral body heights maintained without fracture or subluxation.  Scattered facet degenerative changes.  IMPRESSION: Atrophy with small vessel chronic ischemic changes of deep cerebral white matter.  No acute intracranial abnormalities.  Osseous demineralization with multilevel degenerative disc and facet disease changes of the cervical spine.  Scattered mild listheses as above, felt to be degenerative in origin.  No definite acute cervical spine abnormalities.  Emphysematous, bronchitic and interstitial changes at BILATERAL upper lobes.   Electronically Signed   By: [name] M.D.   On: 12/19/2013 15:35   Ct Cervical Spine Wo Contrast  12/19/2013   CLINICAL DATA:  [address] at [place] office injuring LEFT side, struck LEFT side of head,  denies headache and neck pain, past history of CHF, non ischemic cardiomyopathy, hypertension  EXAM: CT HEAD WITHOUT CONTRAST  CT CERVICAL SPINE WITHOUT CONTRAST  TECHNIQUE: Multidetector CT imaging of the head and cervical spine was performed following the standard protocol without intravenous contrast. Multiplanar CT image reconstructions of the cervical spine were also generated.  COMPARISON:  None  FINDINGS: CT HEAD FINDINGS  Generalized atrophy.  Normal ventricular morphology.  No midline shift or mass effect.  Small vessel chronic ischemic changes of deep cerebral white matter.  No intracranial hemorrhage, mass lesion, or acute infarction.  Visualized paranasal sinuses and mastoid air cells clear.  Bones unremarkable.  CT CERVICAL SPINE FINDINGS  Osseous demineralization.  Visualized skullbase intact.  Beam hardening artifacts of dental origin.  Atherosclerotic calcifications of the LEFT carotid system and aortic arch.  Peribronchial thickening with emphysematous changes and interstitial/septal thickening in the upper lobes.  Prevertebral soft tissues normal thickness.  Disc space narrowing with endplate spur formation throughout cervical region.  Retrolisthesis at C4-C5 and minimally C3-C4.  Mild anterolisthesis at C6-C7.  Vertebral body heights maintained without fracture or subluxation.  Scattered facet degenerative changes.  IMPRESSION: Atrophy with small vessel chronic ischemic changes of deep cerebral white matter.  No acute intracranial abnormalities.  Osseous demineralization with multilevel degenerative disc and facet disease changes of the cervical spine.  Scattered mild listheses as above, felt to be degenerative in origin.  No definite acute cervical spine abnormalities.  Emphysematous, bronchitic and interstitial changes at BILATERAL upper lobes.   Electronically Signed   By: [name] M.D.   On: 12/19/2013 15:35    Review of Systems  Constitutional: Negative.   HENT: Negative.   Eyes:  Negative.   Respiratory: Negative.   Cardiovascular: Negative.   Gastrointestinal: Negative.   Genitourinary: Negative.   Musculoskeletal: Positive for joint pain.  Skin: Negative.   Neurological: Negative.   Endo/Heme/Allergies: Negative.   Psychiatric/Behavioral: Negative.    Blood pressure 133/44, pulse 96, temperature 98.6 F (37 C), temperature source Oral, resp. rate 20, height 5' 5" (1.651 m), weight 48.988 kg (108 lb), SpO2 96.00%. Physical Exam  Constitutional: She appears well-developed.  HENT:  Head: Normocephalic.  Eyes: Pupils are equal, round, and reactive to light.  Neck: Normal range of motion.  Cardiovascular: Normal rate.   Respiratory: Effort normal.  Neurological: She is alert.  Skin: Skin is warm.  Psychiatric: She has a normal mood and affect.   examination of doing left lower extremity demonstrates shortening. Pedal pulses intact bilaterally. She has no knee effusion bilaterally no ankle bruising or crepitus with range of motion bilaterally sensation intact on the dorsum plantar aspect of both feet. Followup her extremities have good grip strength range of motion of the elbows wrists and shoulders.  Assessment/Plan: Impression is left displaced femoral neck fracture in a patient with osteoporosis plan left hip hemiarthroplasty. Initial plan is for press-fit Tri-Lock prosthetic replacement however cementation may be required. Risk and benefits of the procedure discussed with the patient including but not limited to infection leg length inequality dislocation nerve damage potential need for surgery as well as possibility of death and deep vein thrombosis all questions answered  DEAN,GREGORY SCOTT 12/20/2013, 5:18 AM

## 2013-12-20 NOTE — Progress Notes (Signed)
PT Cancellation Note  Patient Details Name: [name] MRN: [id number] DOB: 06-14-1929   Cancelled Treatment:    Reason Eval/Treat Not Completed: Patient not medically ready.  Pt to OR today.  Will need new orders post-op.     Shaleah Nissley, Thornton Papas 12/20/2013, 7:16 AM

## 2013-12-20 NOTE — Progress Notes (Signed)
Displaced left hip fracture following mech fall in ambulatory pt Plan left hip hemi Tuesday at 5 Full consult to follow

## 2013-12-21 ENCOUNTER — Encounter (HOSPITAL_COMMUNITY): Payer: Self-pay | Admitting: Orthopedic Surgery

## 2013-12-21 LAB — CBC
HEMATOCRIT: 37.9 % (ref 36.0–46.0)
HEMOGLOBIN: 12.6 g/dL (ref 12.0–15.0)
MCH: 32.2 pg (ref 26.0–34.0)
MCHC: 33.2 g/dL (ref 30.0–36.0)
MCV: 96.9 fL (ref 78.0–100.0)
Platelets: 101 10*3/uL — ABNORMAL LOW (ref 150–400)
RBC: 3.91 MIL/uL (ref 3.87–5.11)
RDW: 13 % (ref 11.5–15.5)
WBC: 8.1 10*3/uL (ref 4.0–10.5)

## 2013-12-21 LAB — BASIC METABOLIC PANEL
Anion gap: 14 (ref 5–15)
BUN: 5 mg/dL — AB (ref 6–23)
CHLORIDE: 98 meq/L (ref 96–112)
CO2: 23 meq/L (ref 19–32)
Calcium: 8.4 mg/dL (ref 8.4–10.5)
Creatinine, Ser: 0.58 mg/dL (ref 0.50–1.10)
GFR calc Af Amer: >90 mL/min (ref >90)
GFR calc non Af Amer: 84 mL/min — ABNORMAL LOW (ref >90)
GLUCOSE: 118 mg/dL — AB (ref 70–99)
POTASSIUM: 3.9 meq/L (ref 3.7–5.3)
Sodium: 135 mEq/L — ABNORMAL LOW (ref 137–147)

## 2013-12-21 LAB — URINE CULTURE: Colony Count: >=100000

## 2013-12-21 LAB — VITAMIN D 25 HYDROXY (VIT D DEFICIENCY, FRACTURES): VIT D 25 HYDROXY: 40 ng/mL (ref 30–89)

## 2013-12-21 LAB — PROTIME-INR
INR: 1.36 (ref 0.00–1.49)
Prothrombin Time: 16.8 seconds — ABNORMAL HIGH (ref 11.6–15.2)

## 2013-12-21 MED ORDER — OXYCODONE HCL 5 MG PO TABS
5.0000 mg | ORAL_TABLET | ORAL | Status: DC | PRN
  Administered 2013-12-21 – 2013-12-22 (×6): 10 mg via ORAL
  Filled 2013-12-21 (×6): qty 2

## 2013-12-21 MED ORDER — HYDROMORPHONE HCL 1 MG/ML IJ SOLN: 1.0000 mg | INTRAMUSCULAR | Status: DC | PRN

## 2013-12-21 MED ORDER — ACETAMINOPHEN 325 MG PO TABS
650.0000 mg | ORAL_TABLET | Freq: Four times a day (QID) | ORAL | Status: DC
  Administered 2013-12-21 – 2013-12-22 (×5): 650 mg via ORAL
  Filled 2013-12-21 (×4): qty 2

## 2013-12-21 MED ORDER — WARFARIN SODIUM 3 MG PO TABS
3.0000 mg | ORAL_TABLET | Freq: Once | ORAL | Status: AC
  Administered 2013-12-21: 3 mg via ORAL
  Filled 2013-12-21: qty 1

## 2013-12-21 NOTE — Progress Notes (Signed)
OT Cancellation Note  Patient Details Name: [name] MRN: [id number] DOB: 10/26/1929   Cancelled Treatment:    Reason Eval/Treat Not Completed: Other (comment). Re-attempted evaluation. Pt just moved to the bed and received new IV site and states "I just don't have more in me today." Briefly reviewed precautions and will follow-up with pt tomorrow for evaluation.   Villa Herb M Cyndie Chime, OTR/L Occupational Therapist [phone] 12/21/2013, 2:29 PM

## 2013-12-21 NOTE — Evaluation (Signed)
Physical Therapy Evaluation Patient Details Name: [name] MRN: [id number] DOB: 03-20-1930 Today's Date: 12/21/2013   History of Present Illness  pt presents with L hip fx s/p Hemi arthroplasty.    Clinical Impression  Pt very motivated to work on mobility, however limited by weakness.  Pt will need SNF level of care at D/C to maximize independence prior to returning to home alone.  Will continue to follow while on acute.      Follow Up Recommendations SNF    Equipment Recommendations  Rolling walker with 5" wheels;3in1 (PT)    Recommendations for Other Services       Precautions / Restrictions Precautions Precautions: Fall;Posterior Hip Precaution Booklet Issued: Yes (comment) Precaution Comments: Assumed Posterior Hip precautions since pt is hemiarthroplasty. Restrictions Weight Bearing Restrictions: Yes LLE Weight Bearing: Partial weight bearing LLE Partial Weight Bearing Percentage or Pounds: Not specified.  Instructed pt no more than 50% for eval.        Mobility  Bed Mobility Overal bed mobility: Needs Assistance Bed Mobility: Supine to Sit     Supine to sit: Mod assist     General bed mobility comments: cues for sequencing and safe technique.  pt needs A with supporting L LE and bringing hips around to sitting EOB.    Transfers Overall transfer level: Needs assistance Equipment used: Rolling walker (2 wheeled) Transfers: Sit to/from Stand Sit to Stand: Mod assist         General transfer comment: cues for UE use and positioning of LEs.  A with powering up to standing and controlling descent to sitting.    Ambulation/Gait Ambulation/Gait assistance: Min assist Ambulation Distance (Feet): 5 Feet Assistive device: Rolling walker (2 wheeled) Gait Pattern/deviations: Step-to pattern;Decreased step length - right;Decreased stance time - left;Trunk flexed     General Gait Details: cues for gait sequencing, use of RW, and maintaining PWBing 50%.     Stairs            Wheelchair Mobility    Modified Rankin (Stroke Patients Only)       Balance Overall balance assessment: Needs assistance         Standing balance support: Bilateral upper extremity supported Standing balance-Leahy Scale: Poor                               Pertinent Vitals/Pain Pain Assessment: No/denies pain ("It's really not bad.")    Home Living Family/patient expects to be discharged to:: Skilled nursing facility                      Prior Function Level of Independence: Independent               Hand Dominance        Extremity/Trunk Assessment   Upper Extremity Assessment: Defer to OT evaluation           Lower Extremity Assessment: Generalized weakness;LLE deficits/detail   LLE Deficits / Details: AAROM WFL, however strength limited post-op.       Communication   Communication: No difficulties  Cognition Arousal/Alertness: Awake/alert Behavior During Therapy: WFL for tasks assessed/performed Overall Cognitive Status: Within Functional Limits for tasks assessed                      General Comments      Exercises        Assessment/Plan    PT Assessment Patient  needs continued PT services  PT Diagnosis Difficulty walking;Generalized weakness   PT Problem List Decreased strength;Decreased activity tolerance;Decreased balance;Decreased mobility;Decreased coordination;Decreased knowledge of use of DME  PT Treatment Interventions DME instruction;Gait training;Stair training;Functional mobility training;Therapeutic activities;Therapeutic exercise;Balance training;Patient/family education   PT Goals (Current goals can be found in the Care Plan section) Acute Rehab PT Goals Patient Stated Goal: Back to normal PT Goal Formulation: With patient Time For Goal Achievement: 01/04/14 Potential to Achieve Goals: Good    Frequency Min 3X/week   Barriers to discharge        Co-evaluation                End of Session Equipment Utilized During Treatment: Gait belt Activity Tolerance: Patient tolerated treatment well Patient left: in chair;with call bell/phone within reach Nurse Communication: Mobility status         Time: 6606-0045 PT Time Calculation (min): 30 min   Charges:   PT Evaluation $Initial PT Evaluation Tier I: 1 Procedure PT Treatments $Gait Training: 8-22 mins $Therapeutic Activity: 8-22 mins   PT G CodesCatarina Hartshorn, [place] 12/21/2013, 2:43 PM

## 2013-12-21 NOTE — Progress Notes (Signed)
OT Cancellation Note  Patient Details Name: [name] MRN: [id number] DOB: [birth year]   Cancelled Treatment:    Reason Eval/Treat Not Completed: Other (comment). Pt recently returned to chair from using the bathroom and requested that OT return after lunch to complete evaluation. OT will follow up this afternoon.   Villa Herb M Cyndie Chime, OTR/L Occupational Therapist [phone] 12/21/2013, 11:07 AM

## 2013-12-21 NOTE — Progress Notes (Addendum)
ANTICOAGULATION CONSULT NOTE - Follow Up Consult  Pharmacy Consult for coumadin Indication: VTE prophylaxis  Allergies  Allergen Reactions  . Sulfamethoxazole     REACTION: unspecified    Patient Measurements: Height: 5\' 5"  (165.1 cm) Weight: 108 lb (48.988 kg) IBW/kg (Calculated) : 57   Vital Signs: Temp: 99.2 F (37.3 C) (09/23 0507) BP: 111/41 mmHg (09/23 0507) Pulse Rate: 99 (09/23 0507)  Labs:  Recent Labs  12/19/13 1414 12/20/13 0449 12/21/13 0520 12/21/13 1216  HGB 14.6 13.1 12.6  --   HCT 43.1 38.9 37.9  --   PLT 144* 110* 101*  --   LABPROT  --   --   --  16.8*  INR  --   --   --  1.36  CREATININE 0.68 0.65 0.58  --     Estimated Creatinine Clearance: 42.7 ml/min (by C-G formula based on Cr of 0.58).   Assessment: Patient is an 80 y.o F on coumadin for VTE prophylaxis.  INR is 1.36 with coumadin started yesterday.  No bleeding documented.  Goal of Therapy:  INR 2-3    Plan:  1) coumadin 3mg  PO x1 today  Trashaun Streight P 12/21/2013,1:20 PM

## 2013-12-21 NOTE — Discharge Summary (Signed)
[facility] Discharge Summary  Patient name: [name] Medical record number: [id number] Date of birth: [birth year] Age: 80 y.o. Gender: female Date of Admission: 12/19/2013  Date of Discharge: 12/22/2013  Admitting Physician: Willeen Niece, MD  Primary Care Provider: Chrisandra Netters, MD Consultants: Orthopedics  Indication for Hospitalization: Left hip fracture  Discharge Diagnoses/Problem List:  Left hip fracture s/p hemiarthroplasty, osteoporosis, systolic CHF, HTN, HLD  Disposition: SNF  Discharge Condition: Improved  Discharge Exam: Please see progress note for 12/22/2013  Brief Hospital Course:  [name] is a 80 y.o. female presenting with left hip fracture after suffering a mechanical fall . PMH is significant for systolic heart failure (with pacer, no AICD), NICM, HTN, PAF, osteoporosis, and HLD. Also with prolonged QTc on EKG and UTI.   Her hospital course, by problem, is listed below:  #Left Hip Fracture. Patient fell secondary to left knee buckling (history of OA and prior episodes of buckling. Patient had plain film in ED which showed non displaced left hip fracture. Orthopedics was consulted and patient underwent left hip hemiarthroplasty. She tolerated the procedure well without complications. She was additionally started on warfarin for VTE prophylaxis per orthopedics. Her pain was eventually able to be adequately controlled with oral agents. OT and PT were consulted and recommended that the patient be placed in a skilled nursing facility for continued rehab.    #Osteoporosis. Patient's plan films showed osseus demineralization. She had adequate vitamin D levels here. Her home dose of vitamin D was increased to 2000U daily and she was started on calcium citrate twice daily.   #Hypoxia, tachypnea. Patient initially had hypoxia and tachypnea, potentially related to hyperventilation and poor inspiration secondary to acute pain. Other  serious etiologies such as PE or fat embolus were considered, however the patient gradually improved and was able to be weaned to room air.   #Systolic CHF (s/p pacemaker placement) and non-ischemic cardiomyopathy. Patient is followed by Dr Caryl Comes. Her last echo showed an EF of 35-40% with mild MR. She recently discontinued digoxin per Dr Caryl Comes. We continued her home coreg but held her come cozaar in the setting of normotension on admission.   #UTI. UA on admission remarkable for nitrites and moderate leukocytes. We did not give antibiotics as the patient was asymptomatic, afebrile and had a normal WBC.   #Left Foot Sprain: Moderately tender to palpation over left midfoot. No significant erythema or edema. Non tender over posterior edges of medial and lateral malleoli. Was able to bear some weight with rehab. No imaging indicated per [place] ankle rules.  #Prolonged QTc. 527ms on admission. 48ms three months prior. She had normal K and Mg. No QT prolonging medications were identified.  #HTN. Continued her home coreg and held home cozaar as above.   #HLD. No statins listed on home medication list and patient reported that she was not interested in starting a statin at this time.   Issues for Follow Up:  1) Will likely need BMD scan as outpatient with bisphosphonate therapy. We started VitD 2000U daily and calcium citrate while here. 2) Patient will need INR check 1-2 days after discharge due to starting warfarin while here for VTE prophylaxis.   Significant Procedures: Left hip hemiarthroplasty.   Significant Labs and Imaging:   Recent Labs Lab 12/20/13 0449 12/21/13 0520 12/22/13 0644  WBC 7.5 8.1 7.1  HGB 13.1 12.6 11.7*  HCT 38.9 37.9 35.1*  PLT 110* 101* 97*    Recent Labs Lab  12/19/13 1414 12/19/13 2126 12/20/13 0449 12/21/13 0520 12/22/13 0644  NA 140  --  136* 135* 135*  K 4.2  --  3.8 3.9 4.3  CL 103  --  102 98 98  CO2 24  --  23 23 30   GLUCOSE 116*  --  112* 118*  118*  BUN 16  --  9 5* 4*  CREATININE 0.68  --  0.65 0.58 0.53  CALCIUM 9.1  --  8.2* 8.4 8.7  MG  --  1.6  --   --   --   ALKPHOS 67  --   --   --   --   AST 21  --   --   --   --   ALT 13  --   --   --   --   ALBUMIN 3.4*  --   --   --   --    Dg Hip Bilateral W/pelvis 12/19/2013   IMPRESSION: Displaced LEFT femoral neck fracture.  Osseous demineralization.    Ct Head Wo Contrast 12/19/2013    IMPRESSION: Atrophy with small vessel chronic ischemic changes of deep cerebral white matter.  No acute intracranial abnormalities.  Osseous demineralization with multilevel degenerative disc and facet disease changes of the cervical spine.  Scattered mild listheses as above, felt to be degenerative in origin.  No definite acute cervical spine abnormalities.  Emphysematous, bronchitic and interstitial changes at BILATERAL upper lobes.    Ct Cervical Spine Wo Contrast 12/19/2013    IMPRESSION: Atrophy with small vessel chronic ischemic changes of deep cerebral white matter.  No acute intracranial abnormalities.  Osseous demineralization with multilevel degenerative disc and facet disease changes of the cervical spine.  Scattered mild listheses as above, felt to be degenerative in origin.  No definite acute cervical spine abnormalities.  Emphysematous, bronchitic and interstitial changes at BILATERAL upper lobes.   Dg Pelvis Portable 12/21/2013    FINDINGS: Unipolar left hip prosthesis with anticipated postsurgical change surrounding the joint and component in anticipated position.  IMPRESSION: Postoperative change     Results/Tests Pending at Time of Discharge: None  Discharge Medications:    Medication List    STOP taking these medications       acetaminophen 650 MG CR tablet  Commonly known as:  TYLENOL  Replaced by:  acetaminophen 325 MG tablet     digoxin 0.125 MG tablet  Commonly known as:  LANOXIN     Vitamin D-3 1000 UNITS Caps  Replaced by:  cholecalciferol 1000 UNITS tablet       TAKE these medications       acetaminophen 325 MG tablet  Commonly known as:  TYLENOL  Take 2 tablets (650 mg total) by mouth every 6 (six) hours.     aspirin 81 MG EC tablet  Take 81 mg by mouth daily.     calcium citrate 950 MG tablet  Commonly known as:  CALCITRATE - dosed in mg elemental calcium  Take 1 tablet (200 mg of elemental calcium total) by mouth 2 (two) times daily before a meal.     carvedilol 3.125 MG tablet  Commonly known as:  COREG  Take 3.125 mg by mouth 2 (two) times daily with a meal.     cholecalciferol 1000 UNITS tablet  Commonly known as:  VITAMIN D  Take 2 tablets (2,000 Units total) by mouth daily.     cyclobenzaprine 5 MG tablet  Commonly known as:  FLEXERIL  Take 1 tablet (5  mg total) by mouth 3 (three) times daily as needed for muscle spasms.     furosemide 40 MG tablet  Commonly known as:  LASIX  Take 1 tablet (40 mg total) by mouth daily as needed for fluid.     losartan 25 MG tablet  Commonly known as:  COZAAR  Take 25 mg by mouth daily.     oxyCODONE 5 MG immediate release tablet  Commonly known as:  Oxy IR/ROXICODONE  Take 1-2 tablets (5-10 mg total) by mouth every 4 (four) hours as needed for moderate pain or severe pain.     SM CRANBERRY 300 MG tablet  Generic drug:  Cranberry  Take 600 mg by mouth 2 (two) times daily.     warfarin 2 MG tablet  Commonly known as:  COUMADIN  Take 1 tablet (2 mg total) by mouth daily.        Discharge Instructions: Please refer to Patient Instructions section of EMR for full details.  Patient was counseled important signs and symptoms that should prompt return to medical care, changes in medications, dietary instructions, activity restrictions, and follow up appointments.   Follow-Up Appointments: Follow-up Information   Schedule an appointment as soon as possible for a visit with Chrisandra Netters, MD. (For hospital follow up after you complete your short term rehab.)    Specialty:  Family  Medicine   Contact information:   [place] [address] [phone]       Schedule an appointment as soon as possible for a visit with Meredith Pel, MD. (For follow up after you complete rehab)    Specialty:  Orthopedic Surgery   Contact information:   [place] [address] [phone]       Dimas Chyle, MD 12/22/2013, 12:37 PM PGY-1, [place]

## 2013-12-21 NOTE — Progress Notes (Signed)
Family Medicine Teaching Service Daily Progress Note Intern Pager: [phone]  Patient name: DJENEBA BARSCH Medical record number: [id number] Date of birth: 02-25-1930 Age: 80 y.o. Gender: female  Primary Care Provider: Chrisandra Netters, MD Consultants: Orthopedics Code Status: DNR  Pt Overview and Major Events to Date:  9/21 - Admitted with left hip fracture 9/22 - Left hip hemiarthroplasty  Assessment and Plan: VALESKA HAISLIP is a 80 y.o. female presenting with left hip fracture after suffering a mechanical fall . PMH is significant for systolic heart failure (with pacer, no AICD), NICM, HTN, PAF, osteoporosis, and HLD. Also with prolonged QTc on EKG and UTI.   #Left Hip Fracture s/p mechanical fall. Fall caused by left knee buckling (history of OA and prior episodes of buckling). No evidence for cardiac or neurologic etiologies. Neurovascularly intact in lower extremities bilaterally.  -Orthopedics consulted, s/p hip hemiarthroplasty 9/22 -Pain management:  Scheduled tyenol, and Po oxycodone 5mg  and dilaudid 1mg  IV prn  -Flexeril for muscle spasms -Continue to monitor for occult injuries that may be overlooked in setting of major fracture  -Warfarin per orthopedics for VTE prophylaxis -PT/OT consults   #Osteoporosis. Osseus demineralization on hip plain films. Will need BMD measurements as an outpatient and likely bisphosphonate therapy. -Home vitD to 2000U daily -Calcium citrate bid - Vit D level: 40  #Hypoxia, tachypnea. Improving. Potentially related to hyperventilation and poor inspiration secondary to acute pain. Could also consider more serious etiologies such as PE or fat embolus (given additional finding of tachycardia) if patient does not improve.  - O2 as needed to keep O2 sats > 92%, wean as tolerated - continuous pulseox  - Consider CXR or potentially CTA if not improving   #Systolic CHF (s/p pacemaker placement). Also with NICM. Followed by Dr Caryl Comes. Last EF showed EF  of 35-40% with mild MR. Recently discontinued digoxin.  -continue home coreg  -holding home cozaar, aspirin for now   #UTI. UA remarkable for nitrites and moderate leukocytes. Will hold off on treatment at this time as the patient is asymptomatic and is afebrile and has a normal WBC.  -f/u urine culture; if positive with no other criteria from [facility], would not tx.   #Prolonged QTc. 571ms on admission. 434ms three months ago. Normal K. No chest pain. No QT prolonging medications identified, though uncertain contribution from digoxin.  - Magnesium 1.6  - Avoid QT prolonging meds  #HTN. Coreg as above. Holding cozaar.  #HLD. No statins listed on home medication list. Unsure if patient has been on in the past. Can consider adding on after surgery.   FEN/GI: ADAT Prophylaxis: SCDs, warfarin per pharmacy  Disposition: Admitted to telemetry pending  acute post-op management.  Subjective:  Tolerated hemiarthoplasty yesterday well. In moderate pain this morning. Now off supplemental oxygen. No chest pain or shortness of breath.  Objective: Temp:  [98.1 F (36.7 C)-99.7 F (37.6 C)] 99.2 F (37.3 C) (09/23 0507) Pulse Rate:  [93-105] 99 (09/23 0507) Resp:  [16-22] 16 (09/23 0507) BP: (111-133)/(39-76) 111/41 mmHg (09/23 0507) SpO2:  [94 %-99 %] 95 % (09/23 0507) Physical Exam: General: NAD elderly woman sitting in bed  Cardiovascular: Tachycardic, regular rhythm with occasional ectopic beats. No murmurs appreciated Respiratory: NWOB, CTAB Abdomen: +BS, soft, nontender, nondistended Extremities: 1+ DP pulses bilaterally. Brisk capillary refill in feet bilaterally. Sensation and strength (plantar- and dorsiflexion) intact grossly in feet bilaterally   Laboratory:  Recent Labs Lab 12/19/13 1414 12/20/13 0449 12/21/13 0520  WBC 7.1 7.5 8.1  HGB 14.6 13.1 12.6  HCT 43.1 38.9 37.9  PLT 144* 110* 101*    Recent Labs Lab 12/19/13 1414 12/20/13 0449 12/21/13 0520  NA 140 136*  135*  K 4.2 3.8 3.9  CL 103 102 98  CO2 24 23 23   BUN 16 9 5*  CREATININE 0.68 0.65 0.58  CALCIUM 9.1 8.2* 8.4  PROT 6.4  --   --   BILITOT 0.4  --   --   ALKPHOS 67  --   --   ALT 13  --   --   AST 21  --   --   GLUCOSE 116* 112* 118*    Imaging/Diagnostic Tests: Dg Hip Bilateral W/pelvis 12/19/2013  IMPRESSION: Displaced LEFT femoral neck fracture. Osseous demineralization.    Dimas Chyle, MD 12/21/2013, 9:49 AM PGY-1, [place] Intern pager: [phone], text pages welcome

## 2013-12-21 NOTE — Progress Notes (Signed)
Family Medicine Teaching Service PCP Social Visit  I stopped by Ms. Hamler's room tonight to visit with her. She looks to be doing pretty well, all things considered. Her pain is well controlled at this time. She is clearly quite stressed and saddened by this setback. It is a particularly bad time as her children had planned a beach trip next weekend for her and her adult special needs son, Liliane Channel, who is also my patient and for whom she is the primary caregiver. She does have excellent family support. Her other children have come together to help care for [facility], and have made sure that she has everything she needs. Still though, it is hard for all of them to adjust to this current reality.  Ms. Vickrey is delightful, independent, and opinionated. I have really enjoyed being her primary doctor during the last several years, and have learned from her as my patient. I suspect that the loss of her independence, even if transient, will be the hardest part of this entire ordeal for her. She is not enthused about the idea of going to a rehabilitation facility, but understands its importance in recovering.  I did confirm her DNR code status with her and her daughter, as at some point during the course of her surgery her code status was changed to full code. She has made her wishes clear to me during prior encounters as well, so after hearing that additional confirmation from her tonight I have updated her code status back to DNR.   I sincerely appreciate all the excellent care that has been provided for Ms. Bossard by the FPTS, orthopedics, nursing, and PT/OT. She was very complimentary of the care she has received. Please let me know if there is any way I can be of help in her care.  Chrisandra Netters, MD Family Medicine PGY-3  Personal Pager [phone]

## 2013-12-21 NOTE — Op Note (Signed)
NAMETalulah, Schirmer Andersen                  ACCOUNT NO.:  [id number]  MEDICAL RECORD NO.:  [id number]  LOCATION:  5N30C                        FACILITY:  [facility]  PHYSICIAN:  Anderson Malta, M.D.    DATE OF BIRTH:  08-31-1929  DATE OF PROCEDURE:  12/20/2013 DATE OF DISCHARGE:                              OPERATIVE REPORT   PREOPERATIVE DIAGNOSIS:  Left hip femoral neck fracture.  POSTOPERATIVE DIAGNOSIS: Left hip femoral neck fracture.  PROCEDURE:  Left hip femoral neck fracture, hip hemiarthroplasty, DePuy trial lock press-fit 6 femur, 46, -3 high offset head.  SURGEON:  Anderson Malta, M.D.  ASSISTANT:  Nehemiah Massed, PA.  ANESTHESIA:  Spinal.  ESTIMATED BLOOD LOSS:  100 mL.  DRAINS:  None.  INDICATIONS:  [name] is a patient with left hip fracture presents for operative management after explanation of risks and benefits.  PROCEDURE IN DETAIL:  The patient was brought to the operating room, where spinal anesthetic was induced.  IV antibiotics were given preoperatively.  Patient was placed in lateral decubitus position with the right axilla well padded.  Right peroneal nerve well padded.  Left hip was prescrubbed with alcohol and Betadine, allowed to air dry. Prepped with DuraPrep solution and draped in sterile manner.  [name] was used to cover the operative field.  Incision posterior approach to the hip was made after time-out was called.  The fascia lata divided, sciatic nerve identified, palpated and protected all times.  The piriformis tendon was tagged.  Capsule was also tagged and divided in T- shape fashion.  Head was removed, sized to 46.  Femoral neck cut performed with reciprocating saw.  At this time, broaching was performed up to a size 6 with good fit obtained.  Trial reduction was performed with both 0 and -3.  Femoral neck cut was about slightly less than a fingerbreadth above the lesser.  With the patient had excellent stability in full extension, external rotation,  position of sleep, as well as 90 degrees of hip flexion, 10 degrees of adduction, and about of 70 degrees of internal rotation, it was also stable anteriorly with external rotation.  Trial components were removed.  Thorough irrigation was performed.  True components tapped into position.  Bone quality was actually good for the press fit.  Calcar was intact after placing the prosthesis.  Patient was then irrigated and closed by first repairing the capsule tagging the piriformis tendon to the capsule itself as well. The fascia lata closed using #1 Vicryl suture followed by interrupted inverted 0 Vicryl suture and 2-0 Vicryl suture.  Skin staples and an Aquacel dressing.  The patient was transferred to recovery room in stable condition.  Leg lengths approximately equal.  Nehemiah Massed, assistance required at all times during the case for retraction and protection of neurovascular structures and reduction of the hip.  Her assistance was a medical necessity.     Anderson Malta, M.D.     GSD/MEDQ  D:  12/20/2013  T:  12/21/2013  Job:  [id number]

## 2013-12-21 NOTE — Clinical Social Work Note (Signed)
CSW met with pt regarding possible SNF recommendation.  Pt agreeable and prefers [facility].  Full assessment to follow.  PT/OT eval pending.  Nonnie Done, [place] [phone]  Psychiatric & Orthopedics (5N 1-16) Clinical Social Worker

## 2013-12-21 NOTE — Care Management Note (Signed)
CARE MANAGEMENT NOTE 12/21/2013  Patient:  [name], [name]   Account Number:  [id number]  Date Initiated:  12/21/2013  Documentation initiated by:  Ricki Miller  Subjective/Objective Assessment:   80 yr old female admitted with left hip fracture, s/p left total hip arthroplasty.     Action/Plan:   Patient will need shortterm rehab at [facility], Social worker is aware.   Anticipated DC Date:  12/22/2013   Anticipated DC Plan:  SKILLED NURSING FACILITY  In-house referral  Clinical Social Worker      DC Planning Services  CM consult      [facility] Choice  NA   Choice offered to / List presented to:     DME arranged  NA        [place] arranged  NA      Status of service:  Completed, signed off Medicare Important Message given?  YES (If response is "NO", the following Medicare IM given date fields will be blank) Date Medicare IM given:  12/21/2013 Medicare IM given by:  Ricki Miller Date Additional Medicare IM given:   Additional Medicare IM given by:    Discharge Disposition:  [place]  Per UR Regulation:  Reviewed for med. necessity/level of care/duration of stay

## 2013-12-21 NOTE — Anesthesia Postprocedure Evaluation (Signed)
  Anesthesia Post-op Note  Patient: [name]  Procedure(s) Performed: Procedure(s): LEFT HIP HEMI ARTHROPLASTY (Left)  Patient Location: PACU  Anesthesia Type:Spinal  Level of Consciousness: awake  Airway and Oxygen Therapy: Patient Spontanous Breathing  Post-op Pain: mild  Post-op Assessment: Post-op Vital signs reviewed, Patient's Cardiovascular Status Stable, Respiratory Function Stable, Patent Airway and No signs of Nausea or vomiting  Post-op Vital Signs: Reviewed and stable  Last Vitals:  Filed Vitals:   12/21/13 0507  BP: 111/41  Pulse: 99  Temp: 37.3 C  Resp: 16    Complications: No apparent anesthesia complications

## 2013-12-21 NOTE — Progress Notes (Signed)
FMTS Attending Note Patient's care discussed with resident team and I agree with Dr Marigene Ehlers note as documented. Dalbert Mayotte, MD

## 2013-12-21 NOTE — Progress Notes (Signed)
Orthopedic Tech Progress Note Patient Details:  [name] Jun 22, 1929 [id number]  Ortho Devices Ortho Device/Splint Location: trapeze bar patient helper Ortho Device/Splint Interventions: Application   Hildred Priest 12/21/2013, 2:05 PM

## 2013-12-21 NOTE — Progress Notes (Signed)
Pt stable Leg lengths ok Up with PT today snf thursday

## 2013-12-21 NOTE — Discharge Instructions (Addendum)
You were admitted to the hospital after falling and breaking your left hip. While here, you had a partial left hip replacement. You worked with the physical therapist here and will be going to a short term rehab facility for continued physical therapy.   Hip Fracture A hip fracture is a fracture of the upper part of your thigh bone (femur).  CAUSES A hip fracture is caused by a direct blow to the side of your hip. This is usually the result of a fall but can occur in other circumstances, such as an automobile accident. RISK FACTORS There is an increased risk of hip fractures in people with:  An unsteady walking pattern (gait) and those with conditions that contribute to poor balance, such as Parkinson's disease or dementia.  Osteopenia and osteoporosis.  Cancer that spreads to the leg bones.  Certain metabolic diseases. SYMPTOMS  Symptoms of hip fracture include:  Pain over the injured hip.  Inability to put weight on the leg in which the fracture occurred (although, some patients are able to walk after a hip fracture).  Toes and foot of the affected leg point outward when you lie down. DIAGNOSIS A physical exam can determine if a hip fracture is likely to have occurred. X-ray exams are needed to confirm the fracture and to look for other injuries. The X-ray exam can help to determine the type of hip fracture. Rarely, the fracture is not visible on an X-ray image and a CT scan or MRI will have to be done. TREATMENT  The treatment for a fracture is usually surgery. This means using a screw, nail, or rod to hold the bones in place.  HOME CARE INSTRUCTIONS Take all medicines as directed by your health care provider. SEEK MEDICAL CARE IF: Pain continues, even after taking pain medicine. MAKE SURE YOU:  Understand these instructions.   Will watch your condition.  Will get help right away if you are not doing well or get worse. Document Released: 03/17/2005 Document Revised:  03/22/2013 Document Reviewed: 10/27/2012 [facility] Patient Information 2015 [place], [place]. This information is not intended to replace advice given to you by your health care provider. Make sure you discuss any questions you have with your health care provider.      Information on my medicine - Coumadin   (Warfarin)  This medication education was reviewed with me or my healthcare representative as part of my discharge preparation.    Why was Coumadin prescribed for you? Coumadin was prescribed for you because you have a blood clot or a medical condition that can cause an increased risk of forming blood clots. Blood clots can cause serious health problems by blocking the flow of blood to the heart, lung, or brain. Coumadin can prevent harmful blood clots from forming. As a reminder your indication for Coumadin is:   Blood Clot Prevention After Orthopedic Surgery  What test will check on my response to Coumadin? While on Coumadin (warfarin) you will need to have an INR test regularly to ensure that your dose is keeping you in the desired range. The INR (international normalized ratio) number is calculated from the result of the laboratory test called prothrombin time (PT).  If an INR APPOINTMENT HAS NOT ALREADY BEEN MADE FOR YOU please schedule an appointment to have this lab work done by your health care provider within 7 days. Your INR goal is usually a number between:  2 to 3 or your provider may give you a more narrow range like 2-2.5.  Ask your health care provider during an office visit what your goal INR is.  What  do you need to  know  About  COUMADIN? Take Coumadin (warfarin) exactly as prescribed by your healthcare provider about the same time each day.  DO NOT stop taking without talking to the doctor who prescribed the medication.  Stopping without other blood clot prevention medication to take the place of Coumadin may increase your risk of developing a new clot or stroke.  Get  refills before you run out.  What do you do if you miss a dose? If you miss a dose, take it as soon as you remember on the same day then continue your regularly scheduled regimen the next day.  Do not take two doses of Coumadin at the same time.  Important Safety Information A possible side effect of Coumadin (Warfarin) is an increased risk of bleeding. You should call your healthcare provider right away if you experience any of the following:   Bleeding from an injury or your nose that does not stop.   Unusual colored urine (red or dark brown) or unusual colored stools (red or black).   Unusual bruising for unknown reasons.   A serious fall or if you hit your head (even if there is no bleeding).  Some foods or medicines interact with Coumadin (warfarin) and might alter your response to warfarin. To help avoid this:   Eat a balanced diet, maintaining a consistent amount of Vitamin K.   Notify your provider about major diet changes you plan to make.   Avoid alcohol or limit your intake to 1 drink for women and 2 drinks for men per day. (1 drink is 5 oz. wine, 12 oz. beer, or 1.5 oz. liquor.)  Make sure that ANY health care provider who prescribes medication for you knows that you are taking Coumadin (warfarin).  Also make sure the healthcare provider who is monitoring your Coumadin knows when you have started a new medication including herbals and non-prescription products.  Coumadin (Warfarin)  Major Drug Interactions  Increased Warfarin Effect Decreased Warfarin Effect  Alcohol (large quantities) Antibiotics (esp. Septra/Bactrim, Flagyl, Cipro) Amiodarone (Cordarone) Aspirin (ASA) Cimetidine (Tagamet) Megestrol (Megace) NSAIDs (ibuprofen, naproxen, etc.) Piroxicam (Feldene) Propafenone (Rythmol SR) Propranolol (Inderal) Isoniazid (INH) Posaconazole (Noxafil) Barbiturates (Phenobarbital) Carbamazepine (Tegretol) Chlordiazepoxide (Librium) Cholestyramine  (Questran) Griseofulvin Oral Contraceptives Rifampin Sucralfate (Carafate) Vitamin K   Coumadin (Warfarin) Major Herbal Interactions  Increased Warfarin Effect Decreased Warfarin Effect  Garlic Ginseng Ginkgo biloba Coenzyme Q10 Green tea St. Johns wort    Coumadin (Warfarin) FOOD Interactions  Eat a consistent number of servings per week of foods HIGH in Vitamin K (1 serving =  cup)  Collards (cooked, or boiled & drained) Kale (cooked, or boiled & drained) Mustard greens (cooked, or boiled & drained) Parsley *serving size only =  cup Spinach (cooked, or boiled & drained) Swiss chard (cooked, or boiled & drained) Turnip greens (cooked, or boiled & drained)  Eat a consistent number of servings per week of foods MEDIUM-HIGH in Vitamin K (1 serving = 1 cup)  Asparagus (cooked, or boiled & drained) Broccoli (cooked, boiled & drained, or raw & chopped) Brussel sprouts (cooked, or boiled & drained) *serving size only =  cup Lettuce, raw (green leaf, endive, romaine) Spinach, raw Turnip greens, raw & chopped   These websites have more information on Coumadin (warfarin):  FailFactory.se; VeganReport.com.au;

## 2013-12-22 LAB — BASIC METABOLIC PANEL
Anion gap: 7 (ref 5–15)
BUN: 4 mg/dL — AB (ref 6–23)
CALCIUM: 8.7 mg/dL (ref 8.4–10.5)
CO2: 30 mEq/L (ref 19–32)
CREATININE: 0.53 mg/dL (ref 0.50–1.10)
Chloride: 98 mEq/L (ref 96–112)
GFR calc Af Amer: >90 mL/min (ref >90)
GFR, EST NON AFRICAN AMERICAN: 87 mL/min — AB (ref >90)
GLUCOSE: 118 mg/dL — AB (ref 70–99)
Potassium: 4.3 mEq/L (ref 3.7–5.3)
Sodium: 135 mEq/L — ABNORMAL LOW (ref 137–147)

## 2013-12-22 LAB — CBC
HEMATOCRIT: 35.1 % — AB (ref 36.0–46.0)
HEMOGLOBIN: 11.7 g/dL — AB (ref 12.0–15.0)
MCH: 31.9 pg (ref 26.0–34.0)
MCHC: 33.3 g/dL (ref 30.0–36.0)
MCV: 95.6 fL (ref 78.0–100.0)
Platelets: 97 10*3/uL — ABNORMAL LOW (ref 150–400)
RBC: 3.67 MIL/uL — ABNORMAL LOW (ref 3.87–5.11)
RDW: 13 % (ref 11.5–15.5)
WBC: 7.1 10*3/uL (ref 4.0–10.5)

## 2013-12-22 LAB — PROTIME-INR
INR: 1.83 — ABNORMAL HIGH (ref 0.00–1.49)
Prothrombin Time: 21.2 seconds — ABNORMAL HIGH (ref 11.6–15.2)

## 2013-12-22 MED ORDER — WARFARIN SODIUM 2 MG PO TABS: 2.0000 mg | ORAL_TABLET | Freq: Every day | ORAL | Status: DC

## 2013-12-22 MED ORDER — CYCLOBENZAPRINE HCL 5 MG PO TABS: 5.0000 mg | ORAL_TABLET | Freq: Three times a day (TID) | ORAL | Status: DC | PRN

## 2013-12-22 MED ORDER — WARFARIN SODIUM 2 MG PO TABS
2.0000 mg | ORAL_TABLET | Freq: Once | ORAL | Status: DC
  Filled 2013-12-22: qty 1

## 2013-12-22 MED ORDER — VITAMIN D3 25 MCG (1000 UNIT) PO TABS: 2000.0000 [IU] | ORAL_TABLET | Freq: Every day | ORAL | Status: DC

## 2013-12-22 MED ORDER — OXYCODONE HCL 5 MG PO TABS: 5.0000 mg | ORAL_TABLET | ORAL | Status: DC | PRN

## 2013-12-22 MED ORDER — CALCIUM CITRATE 950 (200 CA) MG PO TABS: 200.0000 mg | ORAL_TABLET | Freq: Two times a day (BID) | ORAL | Status: DC

## 2013-12-22 MED ORDER — ACETAMINOPHEN 325 MG PO TABS: 650.0000 mg | ORAL_TABLET | Freq: Four times a day (QID) | ORAL | Status: DC

## 2013-12-22 NOTE — Progress Notes (Signed)
PT Eval in place and CSW sent SNF referral out to [facility].  Will receive bed offers today and CSW will follow up with patient and family.  MD: please complete d/c summary if medically stable.  FL2 on chart for signature and will require scripts for any narcotics if indicated.  Thanks!  Lorie Phenix. Pauline Good, [place]  (coverage for Nonnie Done)

## 2013-12-22 NOTE — Progress Notes (Signed)
[name] to be D/C'd Skilled nursing facility per MD order. Discussed with the patient and all questions fully answered.    Medication List    STOP taking these medications       acetaminophen 650 MG CR tablet  Commonly known as:  TYLENOL  Replaced by:  acetaminophen 325 MG tablet     digoxin 0.125 MG tablet  Commonly known as:  LANOXIN     Vitamin D-3 1000 UNITS Caps  Replaced by:  cholecalciferol 1000 UNITS tablet      TAKE these medications       acetaminophen 325 MG tablet  Commonly known as:  TYLENOL  Take 2 tablets (650 mg total) by mouth every 6 (six) hours.     aspirin 81 MG EC tablet  Take 81 mg by mouth daily.     calcium citrate 950 MG tablet  Commonly known as:  CALCITRATE - dosed in mg elemental calcium  Take 1 tablet (200 mg of elemental calcium total) by mouth 2 (two) times daily before a meal.     carvedilol 3.125 MG tablet  Commonly known as:  COREG  Take 3.125 mg by mouth 2 (two) times daily with a meal.     cholecalciferol 1000 UNITS tablet  Commonly known as:  VITAMIN D  Take 2 tablets (2,000 Units total) by mouth daily.     cyclobenzaprine 5 MG tablet  Commonly known as:  FLEXERIL  Take 1 tablet (5 mg total) by mouth 3 (three) times daily as needed for muscle spasms.     furosemide 40 MG tablet  Commonly known as:  LASIX  Take 1 tablet (40 mg total) by mouth daily as needed for fluid.     losartan 25 MG tablet  Commonly known as:  COZAAR  Take 25 mg by mouth daily.     oxyCODONE 5 MG immediate release tablet  Commonly known as:  Oxy IR/ROXICODONE  Take 1-2 tablets (5-10 mg total) by mouth every 4 (four) hours as needed for moderate pain or severe pain.     SM CRANBERRY 300 MG tablet  Generic drug:  Cranberry  Take 600 mg by mouth 2 (two) times daily.     warfarin 2 MG tablet  Commonly known as:  COUMADIN  Take 1 tablet (2 mg total) by mouth daily.        VVS, Skin clean, dry and intact without evidence of skin break down, no  evidence of skin tears noted.  IV catheter discontinued intact. Site without signs and symptoms of complications. Dressing and pressure applied.  An After Visit Summary was printed and given to the patient.  Patient escorted via stretcher, and D/C to SNF by ambulance.  Cyndra Numbers  12/22/2013 2:44 PM

## 2013-12-22 NOTE — Discharge Summary (Signed)
FMTS Attending Note Patient case discussed with resident team and I agree with Dr Marigene Ehlers note for today.  Dalbert Mayotte, MD

## 2013-12-22 NOTE — Clinical Social Work Note (Addendum)
Pt is being discharged today and admitted to STR/SNF. SNF: [facility] RN to call report to: [phone] Transportation: [facility] Family at bedside notified by [place].  [employer] authorization received from [place] #[id number].  Nonnie Done, [place] [phone]  Psychiatric & Orthopedics (5N 1-16) Clinical Social Worker

## 2013-12-22 NOTE — Clinical Social Work Placement (Signed)
Clinical Social Work Department CLINICAL SOCIAL WORK PLACEMENT NOTE 12/22/2013  Patient:  [name], [name]  Account Number:  [id number] Admit date:  12/19/2013  Clinical Social Worker:  Wylene Men  Date/time:  12/22/2013 10:29 AM  Clinical Social Work is seeking post-discharge placement for this patient at the following level of care:   SKILLED NURSING   (*CSW will update this form in Epic as items are completed)   12/22/2013  Patient/family provided with [place] Department of Clinical Social Work's list of facilities offering this level of care within the geographic area requested by the patient (or if unable, by the patient's family).  12/22/2013  Patient/family informed of their freedom to choose among providers that offer the needed level of care, that participate in Medicare, Medicaid or managed care program needed by the patient, have an available bed and are willing to accept the patient.  12/22/2013  Patient/family informed of [facility]' ownership interest in [facility], as well as of the fact that they are under no obligation to receive care at this facility.  PASARR submitted to EDS on 12/22/2013 PASARR number received on 12/22/2013  FL2 transmitted to all facilities in geographic area requested by pt/family on  12/22/2013 FL2 transmitted to all facilities within larger geographic area on 12/22/2013  Patient informed that his/her managed care company has contracts with or will negotiate with  certain facilities, including the following:     Patient/family informed of bed offers received:  12/22/2013 Patient chooses bed at [place] Physician recommends and patient chooses bed at    Patient to be transferred to [facility] on  12/22/2013 Patient to be transferred to facility by [facility] Patient and family notified of transfer on 12/22/2013 Name of family member notified:  Jerry Caras (at bedside) and pt  The following physician request were  entered in Epic:   Additional Comments:   Nonnie Done, [place] [phone]  Psychiatric & Orthopedics (5N 1-16) Clinical Social Worker

## 2013-12-22 NOTE — Progress Notes (Signed)
ANTICOAGULATION CONSULT NOTE - Follow Up Consult  Pharmacy Consult for coumadin Indication: VTE prophylaxis  Allergies  Allergen Reactions  . Sulfamethoxazole     REACTION: unspecified    Patient Measurements: Height: 5\' 5"  (165.1 cm) Weight: 108 lb (48.988 kg) IBW/kg (Calculated) : 57   Vital Signs: Temp: 98.1 F (36.7 C) (09/24 0401) Temp src: Axillary (09/24 0401) BP: 131/57 mmHg (09/24 0401) Pulse Rate: 102 (09/24 0401)  Labs:  Recent Labs  12/20/13 0449 12/21/13 0520 12/21/13 1216 12/22/13 0644  HGB 13.1 12.6  --  11.7*  HCT 38.9 37.9  --  35.1*  PLT 110* 101*  --  97*  LABPROT  --   --  16.8* 21.2*  INR  --   --  1.36 1.83*  CREATININE 0.65 0.58  --  0.53    Estimated Creatinine Clearance: 42.7 ml/min (by C-G formula based on Cr of 0.53).   Assessment: Patient is an 80 y.o F on coumadin for VTE prophylaxis.  INR increased from 1.36 to 1.83 today.  Jump in INR is probably reflective of 5mg  dose given on 9/23.  No bleeding documented.  Goal of Therapy:  INR 2-3    Plan:  1) coumadin 2mg  PO x1 today (if still here this evening) 2) d/c to SNF today  Angelissa Supan P 12/22/2013,2:08 PM

## 2013-12-22 NOTE — Progress Notes (Signed)
Family Medicine Teaching Service Daily Progress Note Intern Pager: [phone]  Patient name: [name] Medical record number: [id number] Date of birth: 17-Mar-1930 Age: 80 y.o. Gender: female  Primary Care Provider: Chrisandra Netters, MD Consultants: Orthopedics Code Status: DNR  Pt Overview and Major Events to Date:  9/21 - Admitted with left hip fracture 9/22 - Left hip hemiarthroplasty  Assessment and Plan: [name] is a 80 y.o. female presenting with left hip fracture after suffering a mechanical fall . PMH is significant for systolic heart failure (with pacer, no AICD), NICM, HTN, PAF, osteoporosis, and HLD. Also with prolonged QTc on EKG and UTI.   #Left Hip Fracture s/p mechanical fall. Fall caused by left knee buckling (history of OA and prior episodes of buckling). No evidence for cardiac or neurologic etiologies. Neurovascularly intact in lower extremities bilaterally.  -Orthopedics consulted, s/p hip hemiarthroplasty 9/22. -Pain management:  Scheduled tyenol, and Po oxycodone 5mg  and dilaudid 1mg  IV prn  -Flexeril for muscle spasms -Continue to monitor for occult injuries that may be overlooked in setting of major fracture  -Warfarin per orthopedics for VTE prophylaxis -PT/OT consults: SNF placement  #Osteoporosis. Osseus demineralization on hip plain films. Will need BMD measurements as an outpatient and likely bisphosphonate therapy. -Home vitD to 2000U daily -Calcium citrate bid - Vit D level: 40  #Hypoxia, tachypnea. Improving. Potentially related to hyperventilation and poor inspiration secondary to acute pain. Could also consider more serious etiologies such as PE or fat embolus (given additional finding of tachycardia) if patient does not improve.  - O2 as needed to keep O2 sats > 92%, wean as tolerated - continuous pulseox  - Consider CXR or potentially CTA if not improving   #Systolic CHF (s/p pacemaker placement). Also with NICM. Followed by Dr Caryl Comes. Last  EF showed EF of 35-40% with mild MR. Recently discontinued digoxin.  -continue home coreg  -holding home cozaar, aspirin for now   #UTI. UA remarkable for nitrites and moderate leukocytes. Will hold off on treatment at this time as the patient is asymptomatic and is afebrile and has a normal WBC.  -Urine culture: Klebsiella pneumoniae; since no other criteria from [facility], will not treat at this time   #Left Foot Sprain: Moderately tender to palpation over left midfoot. No significant erythema or edema. Non tender over posterior edges of medial and lateral malleoli. Was able to bear some weight with rehab.  - No imaging indicated per [place] ankle rules  #Prolonged QTc. 542ms on admission. 430ms three months ago. Normal K. No chest pain. No QT prolonging medications identified, though uncertain contribution from digoxin.   - Avoid QT prolonging meds  #HTN. Coreg as above. Holding cozaar.  #HLD. No statins listed on home medication list. Unsure if patient has been on in the past. Can consider adding on after surgery.   FEN/GI: ADAT Prophylaxis: SCDs, warfarin per pharmacy  Disposition: Anticipate discharge to SNF today.  Subjective:  Did well with rehab yesterday. Pain is adequately controlled. Has some pain in left foot.  Objective: Temp:  [97.7 F (36.5 C)-99 F (37.2 C)] 98.1 F (36.7 C) (09/24 0401) Pulse Rate:  [97-110] 102 (09/24 0401) Resp:  [16-18] 18 (09/24 0401) BP: (113-131)/(46-57) 131/57 mmHg (09/24 0401) SpO2:  [90 %-96 %] 94 % (09/24 0401) Physical Exam: General: NAD elderly woman sitting in bed  Cardiovascular: Tachycardic, regular rhythm with occasional ectopic beats. No murmurs appreciated Respiratory: NWOB, CTAB Abdomen: +BS, soft, nontender, nondistended Extremities: 1+ DP pulses bilaterally. Brisk  capillary refill in feet bilaterally. Sensation and strength (plantar- and dorsiflexion) intact grossly in feet bilaterally. Tender to palpation on left midfoot.  Nontender on left 5th phalanx. Nontender over posterior edges of lateral and medial malleoli.    Laboratory:  Recent Labs Lab 12/19/13 1414 12/20/13 0449 12/21/13 0520  WBC 7.1 7.5 8.1  HGB 14.6 13.1 12.6  HCT 43.1 38.9 37.9  PLT 144* 110* 101*    Recent Labs Lab 12/19/13 1414 12/20/13 0449 12/21/13 0520  NA 140 136* 135*  K 4.2 3.8 3.9  CL 103 102 98  CO2 24 23 23   BUN 16 9 5*  CREATININE 0.68 0.65 0.58  CALCIUM 9.1 8.2* 8.4  PROT 6.4  --   --   BILITOT 0.4  --   --   ALKPHOS 67  --   --   ALT 13  --   --   AST 21  --   --   GLUCOSE 116* 112* 118*    Imaging/Diagnostic Tests: Dg Hip Bilateral W/pelvis 12/19/2013  IMPRESSION: Displaced LEFT femoral neck fracture. Osseous demineralization.    Dimas Chyle, MD 12/22/2013, 7:21 AM PGY-1, [place] Intern pager: [phone], text pages welcome

## 2013-12-22 NOTE — Clinical Social Work Psychosocial (Signed)
Clinical Social Work Department BRIEF PSYCHOSOCIAL ASSESSMENT 12/22/2013  Patient:  [name], [name]     Account Number:  [id number]     Admit date:  12/19/2013  Clinical Social Worker:  Wylene Men  Date/Time:  12/22/2013 10:23 AM  Referred by:  Physician  Date Referred:  12/22/2013 Referred for  SNF Placement  Psychosocial assessment   Other Referral:   none   Interview type:  Other - See comment Other interview type:   patient and two daughters, Benjamine Mola and [place]    PSYCHOSOCIAL DATA Living Status:  ALONE Admitted from facility:   Level of care:   Primary support name:  Benjamine Mola and Faythe Dingwall Primary support relationship to patient:  CHILD, ADULT Degree of support available:   strong    CURRENT CONCERNS Current Concerns  Post-Acute Placement   Other Concerns:   none    SOCIAL WORK ASSESSMENT / PLAN CSW assessed pt at bedside. Pt was alert and oriented x4. Pt had daughters in room, Faythe Dingwall and Benjamine Mola who remained for the assessment per pt request.  Pt daughter Faythe Dingwall is from [place] lives locally to pt.  Both daughters have been constantly at bedside and involved in patient care.  Pt was recently admitted to [facility] after a fall at home.  Pt is psychologist.  Pt is hopeful to return to independent living upon completion of STR.    Pt requests CSW to begin SNF/STR search in the [facility] area.  First choice is [facility] and second choice is [facility].  Both facilities have been contacted.  [facility] has offered a bed to the patient. Patient and daughters have accepted this offer.  Hoyle Sauer with [facility] to complete admission's paperwork with pt at bedside later this morning.  MD projection to dc this afternoon.  Possible ankle xray pending.   Assessment/plan status:  Psychosocial Support/Ongoing Assessment of Needs Other assessment/ plan:   FL2  PASARR   Information/referral to community resources:   SNF    PATIENT'S/FAMILY'S RESPONSE TO  PLAN OF CARE: Pt and daughter agreeable to SNF/STR disposition and is appreciative of CSW assistance and support.       Nonnie Done, [place] [phone]  Psychiatric & Orthopedics (5N 1-16) Clinical Social Worker

## 2013-12-22 NOTE — Progress Notes (Signed)
Report called to [facility].

## 2013-12-22 NOTE — Progress Notes (Signed)
OT Cancellation Note  Patient Details Name: [name] MRN: [id number] DOB: [birth year]   Cancelled Treatment:    Reason Eval/Treat Not Completed: Other (comment) Pt's current D/C plan is SNF and she has already accepted a bed offer. No apparent immediate acute care OT needs, therefore will defer OT to SNF. If OT eval is needed please call Acute Rehab Dept. at [phone] or text page OT at [phone].     Almon Register [phone] 12/22/2013, 12:49 PM

## 2013-12-22 NOTE — Progress Notes (Signed)
FMTS attending note Patient care discussed with resident team, and I agree with Dr Marigene Ehlers note for today.  Dalbert Mayotte, MD

## 2013-12-22 NOTE — Progress Notes (Signed)
FMTS Attending Note Patient seen and examined by me, discussed with resident team and I agree with Dr Marigene Ehlers note for today. Regina Mayotte, MD

## 2013-12-26 ENCOUNTER — Encounter: Payer: Self-pay | Admitting: Adult Health

## 2013-12-26 ENCOUNTER — Non-Acute Institutional Stay (SKILLED_NURSING_FACILITY): Payer: Commercial Managed Care - HMO | Admitting: Adult Health

## 2013-12-26 DIAGNOSIS — M810 Age-related osteoporosis without current pathological fracture: Secondary | ICD-10-CM

## 2013-12-26 DIAGNOSIS — E785 Hyperlipidemia, unspecified: Secondary | ICD-10-CM

## 2013-12-26 DIAGNOSIS — I10 Essential (primary) hypertension: Secondary | ICD-10-CM

## 2013-12-26 DIAGNOSIS — I5022 Chronic systolic (congestive) heart failure: Secondary | ICD-10-CM

## 2013-12-26 DIAGNOSIS — S72009D Fracture of unspecified part of neck of unspecified femur, subsequent encounter for closed fracture with routine healing: Secondary | ICD-10-CM

## 2013-12-26 DIAGNOSIS — S72002D Fracture of unspecified part of neck of left femur, subsequent encounter for closed fracture with routine healing: Secondary | ICD-10-CM

## 2013-12-26 NOTE — Progress Notes (Signed)
Patient ID: [name], female   DOB: October 18, 1929, 80 y.o.   MRN: [id number]   12/26/2013  Facility:  [facility] Home Location:  [place] Room Number: 244-0 LEVEL OF CARE:  SNF (31)  Routine Visit  Chief Complaint  Patient presents with  . New Admit To SNF    HISTORY OF PRESENT ILLNESS:  This is an 80 year old female who has been admitted to [facility] on 12/22/13 from [facility] with left hip fracture status post hemiarthroplasty. She is being admitted for a short-term rehabilitation.  REASSESSMENT OF ONGOING PROBLEMS:  OSTEOPOROSIS: The patient's osteoporosis remains stable. The patient denies recent worsening of pain and the range of motion remains stable. There has not been any evidence of fractures. No complications noted from the medications presently being used.  CHF:The patient does not relate significant weight changes, denies sob, DOE, orthopnea, PNDs, pedal edema, palpitations or chest pain.  CHF remains stable.  No complications form the medications being used.  HTN: Pt 's HTN remains stable.  Denies CP, sob, DOE, pedal edema, headaches, dizziness or visual disturbances.  No complications from the medications currently being used.  Last BP :   PAST MEDICAL HISTORY:  Past Medical History  Diagnosis Date  . Nonischemic cardiomyopathy     Cath without coronary disease 2006  . Systolic CHF   . Hyperlipidemia   . Hypertension   . Osteoporosis   . Urge incontinence   . Raynaud phenomenon   . PAF (paroxysmal atrial fibrillation)     Short bursts of PAF. Asymptomatic.  Sees Dr Rex Kras.  . Biventricular ICD (implantable cardioverter-defibrillator)-[employer]     Date of implant 2007 generator change 2012  . Sinus tachycardia   . Macro dislodgment of the left ventricular lead 09/09/2012    CURRENT MEDICATIONS: Reviewed per MAR/see medication list  Allergies  Allergen Reactions  . Sulfamethoxazole     REACTION: unspecified      REVIEW OF SYSTEMS:  GENERAL: no change in appetite, no fatigue, no weight changes, no fever, chills or weakness RESPIRATORY: no cough, SOB, DOE, wheezing, hemoptysis CARDIAC: no chest pain, or palpitations, + edema GI: no abdominal pain, diarrhea, constipation, heart burn, nausea or vomiting  PHYSICAL EXAMINATION  GENERAL: no acute distress, normal body habitus EYES: conjunctivae normal, sclerae normal, normal eye lids NECK: supple, trachea midline, no neck masses, no thyroid tenderness, no thyromegaly LYMPHATICS: no LAN in the neck, no supraclavicular LAN RESPIRATORY: breathing is even & unlabored, BS CTAB CARDIAC: RRR, no murmur,no extra heart sounds, LLE edema 2+, left chest pacemaker GI: abdomen soft, normal BS, no masses, no tenderness, no hepatomegaly, no splenomegaly EXTREMITIES:  Able to move x4 extremities PSYCHIATRIC: the patient is alert & oriented to person, affect & behavior appropriate  LABS/RADIOLOGY: Labs reviewed: Basic Metabolic Panel:  Recent Labs  12/19/13 2126 12/20/13 0449 12/21/13 0520 12/22/13 0644  NA  --  136* 135* 135*  K  --  3.8 3.9 4.3  CL  --  102 98 98  CO2  --  23 23 30   GLUCOSE  --  112* 118* 118*  BUN  --  9 5* 4*  CREATININE  --  0.65 0.58 0.53  CALCIUM  --  8.2* 8.4 8.7  MG 1.6  --   --   --    Liver Function Tests:  Recent Labs  10/26/13 0923 12/19/13 1414  AST 18 21  ALT 11 13  ALKPHOS 60 67  BILITOT  0.6 0.4  PROT 6.5 6.4  ALBUMIN 4.0 3.4*   CBC:  Recent Labs  03/21/13 0758  12/19/13 1414 12/20/13 0449 12/21/13 0520 12/22/13 0644  WBC 8.0  < > 7.1 7.5 8.1 7.1  NEUTROABS 4.6  --  5.1  --   --   --   HGB 14.2  < > 14.6 13.1 12.6 11.7*  HCT 42.5  < > 43.1 38.9 37.9 35.1*  MCV 90.9  < > 94.5 94.9 96.9 95.6  PLT 131.0*  < > 144* 110* 101* 97*  < > = values in this interval not displayed.  Dg Hip Bilateral W/pelvis  12/19/2013   CLINICAL DATA:  LEFT hip pain post fall today  EXAM: BILATERAL HIP WITH PELVIS  - 4+ VIEW  COMPARISON:  CT abdomen and pelvis 09/28/2008  FINDINGS: Osseous demineralization.  Displaced LEFT femoral neck fracture.  No dislocation.  Hip and SI joint spaces preserved.  Foreign body projects over pelvis.  SI joints symmetric.  Degenerative disc disease changes lumbar spine.  IMPRESSION: Displaced LEFT femoral neck fracture.  Osseous demineralization.   Electronically Signed   By: Lavonia Dana M.D.   On: 12/19/2013 16:00   Ct Head Wo Contrast  12/19/2013   CLINICAL DATA:  [place] at [place] office injuring LEFT side, struck LEFT side of head, denies headache and neck pain, past history of CHF, non ischemic cardiomyopathy, hypertension  EXAM: CT HEAD WITHOUT CONTRAST  CT CERVICAL SPINE WITHOUT CONTRAST  TECHNIQUE: Multidetector CT imaging of the head and cervical spine was performed following the standard protocol without intravenous contrast. Multiplanar CT image reconstructions of the cervical spine were also generated.  COMPARISON:  None  FINDINGS: CT HEAD FINDINGS  Generalized atrophy.  Normal ventricular morphology.  No midline shift or mass effect.  Small vessel chronic ischemic changes of deep cerebral white matter.  No intracranial hemorrhage, mass lesion, or acute infarction.  Visualized paranasal sinuses and mastoid air cells clear.  Bones unremarkable.  CT CERVICAL SPINE FINDINGS  Osseous demineralization.  Visualized skullbase intact.  Beam hardening artifacts of dental origin.  Atherosclerotic calcifications of the LEFT carotid system and aortic arch.  Peribronchial thickening with emphysematous changes and interstitial/septal thickening in the upper lobes.  Prevertebral soft tissues normal thickness.  Disc space narrowing with endplate spur formation throughout cervical region.  Retrolisthesis at C4-C5 and minimally C3-C4.  Mild anterolisthesis at C6-C7.  Vertebral body heights maintained without fracture or subluxation.  Scattered facet degenerative changes.  IMPRESSION: Atrophy with  small vessel chronic ischemic changes of deep cerebral white matter.  No acute intracranial abnormalities.  Osseous demineralization with multilevel degenerative disc and facet disease changes of the cervical spine.  Scattered mild listheses as above, felt to be degenerative in origin.  No definite acute cervical spine abnormalities.  Emphysematous, bronchitic and interstitial changes at BILATERAL upper lobes.   Electronically Signed   By: Lavonia Dana M.D.   On: 12/19/2013 15:35   Ct Cervical Spine Wo Contrast  12/19/2013   CLINICAL DATA:  [address] at [place] office injuring LEFT side, struck LEFT side of head, denies headache and neck pain, past history of CHF, non ischemic cardiomyopathy, hypertension  EXAM: CT HEAD WITHOUT CONTRAST  CT CERVICAL SPINE WITHOUT CONTRAST  TECHNIQUE: Multidetector CT imaging of the head and cervical spine was performed following the standard protocol without intravenous contrast. Multiplanar CT image reconstructions of the cervical spine were also generated.  COMPARISON:  None  FINDINGS: CT HEAD FINDINGS  Generalized atrophy.  Normal ventricular morphology.  No midline shift or mass effect.  Small vessel chronic ischemic changes of deep cerebral white matter.  No intracranial hemorrhage, mass lesion, or acute infarction.  Visualized paranasal sinuses and mastoid air cells clear.  Bones unremarkable.  CT CERVICAL SPINE FINDINGS  Osseous demineralization.  Visualized skullbase intact.  Beam hardening artifacts of dental origin.  Atherosclerotic calcifications of the LEFT carotid system and aortic arch.  Peribronchial thickening with emphysematous changes and interstitial/septal thickening in the upper lobes.  Prevertebral soft tissues normal thickness.  Disc space narrowing with endplate spur formation throughout cervical region.  Retrolisthesis at C4-C5 and minimally C3-C4.  Mild anterolisthesis at C6-C7.  Vertebral body heights maintained without fracture or subluxation.  Scattered  facet degenerative changes.  IMPRESSION: Atrophy with small vessel chronic ischemic changes of deep cerebral white matter.  No acute intracranial abnormalities.  Osseous demineralization with multilevel degenerative disc and facet disease changes of the cervical spine.  Scattered mild listheses as above, felt to be degenerative in origin.  No definite acute cervical spine abnormalities.  Emphysematous, bronchitic and interstitial changes at BILATERAL upper lobes.   Electronically Signed   By: Lavonia Dana M.D.   On: 12/19/2013 15:35   Dg Pelvis Portable  12/21/2013   CLINICAL DATA:  Postop left hip replacement  EXAM: PORTABLE PELVIS 1-2 VIEWS  COMPARISON:  12/19/2013  FINDINGS: Unipolar left hip prosthesis with anticipated postsurgical change surrounding the joint and component in anticipated position.  IMPRESSION: Postoperative change   Electronically Signed   By: Skipper Cliche M.D.   On: 12/21/2013 02:32    ASSESSMENT/PLAN:  Left hip fracture status post hemiarthroplasty - for rehabilitation Systolic CHF - stable Hypertension - well controlled; continue Coreg Osteoporosis - continue calcium citrate Hyperlipidemia - patient not interested in starting a statin   CPT CODE: 24401    MEDINA-VARGAS,MONINA, [place]

## 2013-12-27 ENCOUNTER — Non-Acute Institutional Stay (SKILLED_NURSING_FACILITY): Payer: Commercial Managed Care - HMO | Admitting: Internal Medicine

## 2013-12-27 DIAGNOSIS — I428 Other cardiomyopathies: Secondary | ICD-10-CM

## 2013-12-27 DIAGNOSIS — S72002S Fracture of unspecified part of neck of left femur, sequela: Secondary | ICD-10-CM

## 2013-12-27 DIAGNOSIS — S72009S Fracture of unspecified part of neck of unspecified femur, sequela: Secondary | ICD-10-CM

## 2013-12-27 DIAGNOSIS — I10 Essential (primary) hypertension: Secondary | ICD-10-CM

## 2013-12-27 DIAGNOSIS — I4891 Unspecified atrial fibrillation: Secondary | ICD-10-CM

## 2013-12-27 DIAGNOSIS — I480 Paroxysmal atrial fibrillation: Secondary | ICD-10-CM | POA: Insufficient documentation

## 2013-12-27 NOTE — Progress Notes (Signed)
HISTORY & PHYSICAL  DATE: 12/27/2013   FACILITY: [facility]  LEVEL OF CARE: SNF (31)  ALLERGIES:  Allergies  Allergen Reactions  . Sulfamethoxazole     REACTION: unspecified    CHIEF COMPLAINT:  Manage left hip fracture, cardiomyopathy and atrial fibrillation  HISTORY OF PRESENT ILLNESS: Patient is an 80 year old Caucasian female.  HIP FRACTURE: The patient had a mechanical fall and sustained a femur fracture.  Patient subsequently underwent left hip hemiarthroplasty and tolerated the procedure well. Patient is admitted to this facility for short-term rehabilitation. Patient denies hip pain currently. No complications reported from the pain medications currently being used.  CARDIOMYOPATHY: The patient's cardiomyopathy remains stable. Patient denies increasing lower extremity swelling, shortness of breath, chest pain, palpitations, orthopnea or PNDs. No complications reported from the medications currently being used. EF 35-40%. Has chronic lower extremity swelling.  ATRIAL FIBRILLATION: the patients atrial fibrillation remains stable.  The patient denies DOE, tachycardia, orthopnea, transient neurological sx, palpitations, & PNDs.  No complications noted from the medications currently being used.  PAST MEDICAL HISTORY :  Past Medical History  Diagnosis Date  . Nonischemic cardiomyopathy     Cath without coronary disease 2006  . Systolic CHF   . Hyperlipidemia   . Hypertension   . Osteoporosis   . Urge incontinence   . Raynaud phenomenon   . PAF (paroxysmal atrial fibrillation)     Short bursts of PAF. Asymptomatic.  Sees Dr Rex Kras.  . Biventricular ICD (implantable cardioverter-defibrillator)-[employer]     Date of implant 2007 generator change 2012  . Sinus tachycardia   . Macro dislodgment of the left ventricular lead 09/09/2012    PAST SURGICAL HISTORY: Past Surgical History  Procedure Laterality Date  . Appendectomy    . Biventricular  icd implant  06/28/2010; 03/28/2013    STJ CRTD implanted 2012 with dislodgement of LV lead 2014; 03/28/2013 CRTP placed with new LV lead by Dr Caryl Comes  . Bladder tack    . Cardiac catheterization  03/2005    EF 25%, Normal arteries, Nonischemic CM  . Abdominal hysterectomy  1970s  . Suprapubic urethrovesicular suspension  1992  . Hip arthroplasty Left 12/20/2013    Procedure: LEFT HIP HEMI ARTHROPLASTY;  Surgeon: Meredith Pel, MD;  Location: [place];  Service: Orthopedics;  Laterality: Left;    SOCIAL HISTORY:  reports that she quit smoking about 11 years ago. She has never used smokeless tobacco. She reports that she does not drink alcohol or use illicit drugs.  FAMILY HISTORY:  Family History  Problem Relation Age of Onset  . Heart disease Mother   . Heart disease Father   . Cancer Father     Kidney  . Heart disease Brother   . Diabetes Son     CURRENT MEDICATIONS: Reviewed per MAR/see medication list  REVIEW OF SYSTEMS:  See HPI otherwise 14 point ROS is negative.  PHYSICAL EXAMINATION  VS:  See VS section  GENERAL: no acute distress, thin body habitus EYES: conjunctivae normal, sclerae normal, normal eye lids MOUTH/THROAT: lips without lesions,no lesions in the mouth,tongue is without lesions,uvula elevates in midline NECK: supple, trachea midline, no neck masses, no thyroid tenderness, no thyromegaly LYMPHATICS: no LAN in the neck, no supraclavicular LAN RESPIRATORY: breathing is even & unlabored, BS CTAB CARDIAC: Heart rate is irregularly irregular, no murmur,no extra heart sounds, +2 bilateral lower extremity edema GI:  ABDOMEN: abdomen soft, normal BS, no masses, no tenderness  LIVER/SPLEEN: no hepatomegaly, no splenomegaly MUSCULOSKELETAL: HEAD: normal to inspection  EXTREMITIES: LEFT UPPER EXTREMITY: full range of motion, normal strength & tone RIGHT UPPER EXTREMITY:  full range of motion, normal strength & tone LEFT LOWER EXTREMITY:  range of motion not  tested due to surgery, normal strength & tone RIGHT LOWER EXTREMITY:  Minimal range of motion, normal strength & tone PSYCHIATRIC: the patient is alert & oriented to person, affect & behavior appropriate  LABS/RADIOLOGY:  Labs reviewed: Basic Metabolic Panel:  Recent Labs  12/19/13 2126 12/20/13 0449 12/21/13 0520 12/22/13 0644  NA  --  136* 135* 135*  K  --  3.8 3.9 4.3  CL  --  102 98 98  CO2  --  23 23 30   GLUCOSE  --  112* 118* 118*  BUN  --  9 5* 4*  CREATININE  --  0.65 0.58 0.53  CALCIUM  --  8.2* 8.4 8.7  MG 1.6  --   --   --    Liver Function Tests:  Recent Labs  10/26/13 0923 12/19/13 1414  AST 18 21  ALT 11 13  ALKPHOS 60 67  BILITOT 0.6 0.4  PROT 6.5 6.4  ALBUMIN 4.0 3.4*   CBC:  Recent Labs  03/21/13 0758  12/19/13 1414 12/20/13 0449 12/21/13 0520 12/22/13 0644  WBC 8.0  < > 7.1 7.5 8.1 7.1  NEUTROABS 4.6  --  5.1  --   --   --   HGB 14.2  < > 14.6 13.1 12.6 11.7*  HCT 42.5  < > 43.1 38.9 37.9 35.1*  MCV 90.9  < > 94.5 94.9 96.9 95.6  PLT 131.0*  < > 144* 110* 101* 97*  < > = values in this interval not displayed.   Transthoracic Echocardiography  Patient:    [name], [place] MR #:       [id number] Study Date: 09/15/2012 Gender:     F Age:        76 Height:     165.1cm Weight:     51.7kg BSA:        1.45m^2 Pt. Status: Room:    ATTENDING    [place], [place], [facility]  SONOGRAPHER  Barbra Sarks, RDCS  ORDERING     Virl Axe Md  REFERRING    Virl Axe Md cc:  ------------------------------------------------------------ LV EF: 25% -   30%  ------------------------------------------------------------ Indications:     Other primary cardiomyopathy 425.4  ------------------------------------------------------------ History:   PMH:  Acquired from the patient and from the patient's chart.  Fatigue and bilateral lower extremity edema.  Tachycardia. Paroxysmal Atrial fibrillation. Non-ischemic  cardiomyopathy.  Congestive heart failure. Risk factors:  Former tobacco use. Hypertension. Dyslipidemia.  ------------------------------------------------------------ Study Conclusions  - Left ventricle: The cavity size was normal. Wall thickness   was normal. Systolic function was severely reduced. The   estimated ejection fraction was in the range of 25% to   30%. Diffuse hypokinesis. There was septal-lateral   dyssynchrony. Doppler parameters are consistent with   abnormal left ventricular relaxation (grade 1 diastolic   dysfunction). - Aortic valve: There was no stenosis. Mild regurgitation. - Mitral valve: Moderate central regurgitation. - Left atrium: The atrium was mildly dilated. - Right ventricle: The cavity size was normal. Pacer wire or   catheter noted in right ventricle. Systolic function was   normal. - Tricuspid valve: Peak RV-RA gradient: 92mm Hg (S). - Pulmonary arteries: PA systolic pressure 73-71 mmHg. -  Systemic veins: IVC measured 1.9 cm with normal   respirophasic variation, suggesting RA pressure 6-10 mmHg. Impressions:  - Normal LV size with severe global hypokinesis, EF 25-30%.   There was septal-lateral dyssynchrony. Normal RV size and   systolic function. Moderate central MR.  ------------------------------------------------------------ Labs, prior tests, procedures, and surgery: Catheterization (2006).  ICD system implantation.    Currently implanted device: implantable cardioverter defibrillator. Transthoracic echocardiography.  M-mode, complete 2D, spectral Doppler, and color Doppler.  Height:  Height: 165.1cm. Height: 65in.  Weight:  Weight: 51.7kg. Weight: 113.8lb.  Body mass index:  BMI: 19kg/m^2.  Body surface area:    BSA: 1.73m^2.  Blood pressure:     142/70.  Patient status:  Outpatient.  Location:   Site  3  ------------------------------------------------------------  ------------------------------------------------------------ Left ventricle:  The cavity size was normal. Wall thickness was normal. Systolic function was severely reduced. The estimated ejection fraction was in the range of 25% to 30%. Diffuse hypokinesis. There was septal-lateral dyssynchrony. Doppler parameters are consistent with abnormal left ventricular relaxation (grade 1 diastolic dysfunction).  ------------------------------------------------------------ Aortic valve:   Trileaflet.  Doppler:   There was no stenosis.    Mild regurgitation.  ------------------------------------------------------------ Aorta:  Aortic root: The aortic root was normal in size. Ascending aorta: The ascending aorta was normal in size.  ------------------------------------------------------------ Mitral valve:   Doppler:   There was no evidence for stenosis.   Moderate central regurgitation.  ------------------------------------------------------------ Left atrium:  The atrium was mildly dilated.  ------------------------------------------------------------ Right ventricle:  The cavity size was normal. Pacer wire or catheter noted in right ventricle. Systolic function was normal.  ------------------------------------------------------------ Pulmonic valve:    Structurally normal valve.   Cusp separation was normal.  Doppler:  Transvalvular velocity was within the normal range.  Mild regurgitation.  ------------------------------------------------------------ Tricuspid valve:   Doppler:   Mild regurgitation.  ------------------------------------------------------------ Pulmonary artery:   PA systolic pressure 95-62 mmHg.  ------------------------------------------------------------ Right atrium:  The atrium was normal in size.  ------------------------------------------------------------ Pericardium:  There was no  pericardial effusion.  ------------------------------------------------------------ Systemic veins:  IVC measured 1.9 cm with normal respirophasic variation, suggesting RA pressure 6-10 mmHg.  ------------------------------------------------------------  2D measurements        Normal  Doppler measurements   Norma Left ventricle                                        l LVID ED,   45.5 mm     43-52   Left ventricle chord,                         Ea, lat   4.1 cm/s     ----- PLAX                           Lizvette, tiss   9 LVID ES,   36.9 mm     23-38   DP chord,                         E/Ea, lat 10.          ----- PLAX                           Emillie, tiss  72 FS, chord,   19 %      >  29     DP PLAX                           Ea, med   3.3 cm/s     ----- LVPW, ED     12 mm     ------  Jeraldean, tiss   1 IVS/LVPW   0.96        <1.3    DP ratio, ED                      E/Ea, med 13.          ----- Ventricular septum             Dalisa, tiss  56 IVS, ED    11.5 mm     ------  DP LVOT                           LVOT Diam, S      21 mm     ------  Peak vel, 61. cm/s     ----- Area       3.46 cm^2   ------  S           7 Diam         21 mm     ------  VTI, S    10. cm       ----- Aorta                                      3 Root diam,   32 mm     ------  HR         76 bpm      ----- ED                             Stroke    35. ml       ----- Left atrium                    vol         7 AP dim       40 mm     ------  Cardiac   2.7 L/min    ----- AP dim     2.56 cm/m^2 <2.2    output index                          Cardiac   1.7 L/(min-m -----                                index         ^2)                                Stroke    22. ml/m^2   -----                                index       9  Aortic valve                                Regurg    390 ms       -----                                PHT                                Mitral valve                                 Peak E    44. cm/s     -----                                vel         9                                Peak A    92. cm/s     -----                                vel         8                                Decelerat 165 ms       150-2                                ion time               30                                Peak E/A  0.5          -----                                ratio                                Tricuspid valve                                Regurg    232 cm/s     -----                                peak vel                                Peak       22 mm Hg    -----  RV-RA                                gradient,                                S                                Right ventricle                                Sa vel,   10. cm/s     -----                                lat Zakaria,    6                                tiss DP   CT HEAD WITHOUT CONTRAST   CT CERVICAL SPINE WITHOUT CONTRAST   TECHNIQUE: Multidetector CT imaging of the head and cervical spine was performed following the standard protocol without intravenous contrast. Multiplanar CT image reconstructions of the cervical spine were also generated.   COMPARISON:  None   FINDINGS: CT HEAD FINDINGS   Generalized atrophy.   Normal ventricular morphology.   No midline shift or mass effect.   Small vessel chronic ischemic changes of deep cerebral white matter.   No intracranial hemorrhage, mass lesion, or acute infarction.   Visualized paranasal sinuses and mastoid air cells clear.   Bones unremarkable.   CT CERVICAL SPINE FINDINGS   Osseous demineralization.   Visualized skullbase intact.   Beam hardening artifacts of dental origin.   Atherosclerotic calcifications of the LEFT carotid system and aortic arch.   Peribronchial thickening with emphysematous changes and interstitial/septal thickening in the upper lobes.   Prevertebral  soft tissues normal thickness.   Disc space narrowing with endplate spur formation throughout cervical region.   Retrolisthesis at C4-C5 and minimally C3-C4.   Mild anterolisthesis at C6-C7.   Vertebral body heights maintained without fracture or subluxation.   Scattered facet degenerative changes.   IMPRESSION: Atrophy with small vessel chronic ischemic changes of deep cerebral white matter.   No acute intracranial abnormalities.   Osseous demineralization with multilevel degenerative disc and facet disease changes of the cervical spine.   Scattered mild listheses as above, felt to be degenerative in origin.   No definite acute cervical spine abnormalities.   Emphysematous, bronchitic and interstitial changes at BILATERAL upper lobes.     BILATERAL HIP WITH PELVIS - 4+ VIEW   COMPARISON:  CT abdomen and pelvis 09/28/2008   FINDINGS: Osseous demineralization.   Displaced LEFT femoral neck fracture.   No dislocation.   Hip and SI joint spaces preserved.   Foreign body projects over pelvis.   SI joints symmetric.   Degenerative disc disease changes lumbar spine.   IMPRESSION: Displaced LEFT femoral neck fracture.   Osseous demineralization.     PORTABLE PELVIS 1-2 VIEWS   COMPARISON:  12/19/2013   FINDINGS: Unipolar left hip prosthesis with anticipated postsurgical change surrounding the joint and component in anticipated position.   IMPRESSION: Postoperative change  ASSESSMENT/PLAN:  Left hip fracture-status post hemiarthroplasty. Continue rehabilitation. Atrial fibrillation-rate controlled Cardiomyopathy-compensated Hypertension-well-controlled Anemia-check hemoglobin Thrombocytopenia recheck platelets  I have reviewed patient's medical records received at admission/from hospitalization.  CPT CODE: 79390  Gayani Y Dasanayaka, [place] [phone]

## 2013-12-29 ENCOUNTER — Non-Acute Institutional Stay (SKILLED_NURSING_FACILITY): Payer: Commercial Managed Care - HMO | Admitting: Adult Health

## 2013-12-29 ENCOUNTER — Encounter: Payer: Self-pay | Admitting: Adult Health

## 2013-12-29 DIAGNOSIS — N390 Urinary tract infection, site not specified: Secondary | ICD-10-CM

## 2013-12-29 NOTE — Progress Notes (Signed)
Patient ID: [name], female   DOB: 1929-10-10, 80 y.o.   MRN: [id number]   12/29/2013  Facility:  Nursing Home Location:  [place] Room Number: 366-2 LEVEL OF CARE:  SNF (31)   Chief Complaint  Patient presents with  . Acute Visit    UTI    HISTORY OF PRESENT ILLNESS:  This is an 80 year old female who complained of dysuria and abdominal cramping. No reported fever nor hematuria. Urine culture shows > 100,000 CFU/ml Klebsiella pneumoniae ssp pneumoniae.   PAST MEDICAL HISTORY:  Past Medical History  Diagnosis Date  . Nonischemic cardiomyopathy     Cath without coronary disease 2006  . Systolic CHF   . Hyperlipidemia   . Hypertension   . Osteoporosis   . Urge incontinence   . Raynaud phenomenon   . PAF (paroxysmal atrial fibrillation)     Short bursts of PAF. Asymptomatic.  Sees Dr Rex Kras.  . Biventricular ICD (implantable cardioverter-defibrillator)-[employer]     Date of implant 2007 generator change 2012  . Sinus tachycardia   . Macro dislodgment of the left ventricular lead 09/09/2012    CURRENT MEDICATIONS: Reviewed per MAR/see medication list  Allergies  Allergen Reactions  . Sulfamethoxazole     REACTION: unspecified     REVIEW OF SYSTEMS:  GENERAL: no change in appetite, no fatigue, no weight changes, no fever, chills or weakness RESPIRATORY: no cough, SOB, DOE, wheezing, hemoptysis CARDIAC: no chest pain, or palpitations, + edema GI: no abdominal pain, diarrhea, constipation, heart burn, nausea or vomiting GU:  - hematuria, +dysuria PHYSICAL EXAMINATION  GENERAL: no acute distress, normal body habitus NECK: supple, trachea midline, no neck masses, no thyroid tenderness, no thyromegaly LYMPHATICS: no LAN in the neck, no supraclavicular LAN RESPIRATORY: breathing is even & unlabored, BS CTAB CARDIAC: RRR, no murmur,no extra heart sounds, LLE edema 2+, left chest pacemaker GI: abdomen soft, normal BS, no masses, no  tenderness, no hepatomegaly, no splenomegaly EXTREMITIES:  Able to move x4 extremities; walks with walker PSYCHIATRIC: the patient is alert & oriented to person, affect & behavior appropriate  LABS/RADIOLOGY: Labs reviewed: Basic Metabolic Panel:  Recent Labs  12/19/13 2126 12/20/13 0449 12/21/13 0520 12/22/13 0644  NA  --  136* 135* 135*  K  --  3.8 3.9 4.3  CL  --  102 98 98  CO2  --  23 23 30   GLUCOSE  --  112* 118* 118*  BUN  --  9 5* 4*  CREATININE  --  0.65 0.58 0.53  CALCIUM  --  8.2* 8.4 8.7  MG 1.6  --   --   --    Liver Function Tests:  Recent Labs  10/26/13 0923 12/19/13 1414  AST 18 21  ALT 11 13  ALKPHOS 60 67  BILITOT 0.6 0.4  PROT 6.5 6.4  ALBUMIN 4.0 3.4*   CBC:  Recent Labs  03/21/13 0758  12/19/13 1414 12/20/13 0449 12/21/13 0520 12/22/13 0644  WBC 8.0  < > 7.1 7.5 8.1 7.1  NEUTROABS 4.6  --  5.1  --   --   --   HGB 14.2  < > 14.6 13.1 12.6 11.7*  HCT 42.5  < > 43.1 38.9 37.9 35.1*  MCV 90.9  < > 94.5 94.9 96.9 95.6  PLT 131.0*  < > 144* 110* 101* 97*  < > = values in this interval not displayed.  Dg Hip Bilateral W/pelvis  12/19/2013   CLINICAL  DATA:  LEFT hip pain post fall today  EXAM: BILATERAL HIP WITH PELVIS - 4+ VIEW  COMPARISON:  CT abdomen and pelvis 09/28/2008  FINDINGS: Osseous demineralization.  Displaced LEFT femoral neck fracture.  No dislocation.  Hip and SI joint spaces preserved.  Foreign body projects over pelvis.  SI joints symmetric.  Degenerative disc disease changes lumbar spine.  IMPRESSION: Displaced LEFT femoral neck fracture.  Osseous demineralization.   Electronically Signed   By: Lavonia Dana M.D.   On: 12/19/2013 16:00   Ct Head Wo Contrast  12/19/2013   CLINICAL DATA:  [address] at [place] office injuring LEFT side, struck LEFT side of head, denies headache and neck pain, past history of CHF, non ischemic cardiomyopathy, hypertension  EXAM: CT HEAD WITHOUT CONTRAST  CT CERVICAL SPINE WITHOUT CONTRAST  TECHNIQUE:  Multidetector CT imaging of the head and cervical spine was performed following the standard protocol without intravenous contrast. Multiplanar CT image reconstructions of the cervical spine were also generated.  COMPARISON:  None  FINDINGS: CT HEAD FINDINGS  Generalized atrophy.  Normal ventricular morphology.  No midline shift or mass effect.  Small vessel chronic ischemic changes of deep cerebral white matter.  No intracranial hemorrhage, mass lesion, or acute infarction.  Visualized paranasal sinuses and mastoid air cells clear.  Bones unremarkable.  CT CERVICAL SPINE FINDINGS  Osseous demineralization.  Visualized skullbase intact.  Beam hardening artifacts of dental origin.  Atherosclerotic calcifications of the LEFT carotid system and aortic arch.  Peribronchial thickening with emphysematous changes and interstitial/septal thickening in the upper lobes.  Prevertebral soft tissues normal thickness.  Disc space narrowing with endplate spur formation throughout cervical region.  Retrolisthesis at C4-C5 and minimally C3-C4.  Mild anterolisthesis at C6-C7.  Vertebral body heights maintained without fracture or subluxation.  Scattered facet degenerative changes.  IMPRESSION: Atrophy with small vessel chronic ischemic changes of deep cerebral white matter.  No acute intracranial abnormalities.  Osseous demineralization with multilevel degenerative disc and facet disease changes of the cervical spine.  Scattered mild listheses as above, felt to be degenerative in origin.  No definite acute cervical spine abnormalities.  Emphysematous, bronchitic and interstitial changes at BILATERAL upper lobes.   Electronically Signed   By: Lavonia Dana M.D.   On: 12/19/2013 15:35   Ct Cervical Spine Wo Contrast  12/19/2013   CLINICAL DATA:  [address] at [place] office injuring LEFT side, struck LEFT side of head, denies headache and neck pain, past history of CHF, non ischemic cardiomyopathy, hypertension  EXAM: CT HEAD WITHOUT  CONTRAST  CT CERVICAL SPINE WITHOUT CONTRAST  TECHNIQUE: Multidetector CT imaging of the head and cervical spine was performed following the standard protocol without intravenous contrast. Multiplanar CT image reconstructions of the cervical spine were also generated.  COMPARISON:  None  FINDINGS: CT HEAD FINDINGS  Generalized atrophy.  Normal ventricular morphology.  No midline shift or mass effect.  Small vessel chronic ischemic changes of deep cerebral white matter.  No intracranial hemorrhage, mass lesion, or acute infarction.  Visualized paranasal sinuses and mastoid air cells clear.  Bones unremarkable.  CT CERVICAL SPINE FINDINGS  Osseous demineralization.  Visualized skullbase intact.  Beam hardening artifacts of dental origin.  Atherosclerotic calcifications of the LEFT carotid system and aortic arch.  Peribronchial thickening with emphysematous changes and interstitial/septal thickening in the upper lobes.  Prevertebral soft tissues normal thickness.  Disc space narrowing with endplate spur formation throughout cervical region.  Retrolisthesis at C4-C5 and minimally C3-C4.  Mild anterolisthesis at C6-C7.  Vertebral body heights maintained without fracture or subluxation.  Scattered facet degenerative changes.  IMPRESSION: Atrophy with small vessel chronic ischemic changes of deep cerebral white matter.  No acute intracranial abnormalities.  Osseous demineralization with multilevel degenerative disc and facet disease changes of the cervical spine.  Scattered mild listheses as above, felt to be degenerative in origin.  No definite acute cervical spine abnormalities.  Emphysematous, bronchitic and interstitial changes at BILATERAL upper lobes.   Electronically Signed   By: Lavonia Dana M.D.   On: 12/19/2013 15:35   Dg Pelvis Portable  12/21/2013   CLINICAL DATA:  Postop left hip replacement  EXAM: PORTABLE PELVIS 1-2 VIEWS  COMPARISON:  12/19/2013  FINDINGS: Unipolar left hip prosthesis with anticipated  postsurgical change surrounding the joint and component in anticipated position.  IMPRESSION: Postoperative change   Electronically Signed   By: Skipper Cliche M.D.   On: 12/21/2013 02:32    ASSESSMENT/PLAN:  UTI - MicroBid 100 mg 1 capsule by mouth twice a day x7 days  CPT CODE: 95284    MEDINA-VARGAS,[place], [place] [phone]

## 2014-01-02 ENCOUNTER — Other Ambulatory Visit: Payer: Self-pay | Admitting: *Deleted

## 2014-01-02 MED ORDER — OXYCODONE HCL 5 MG PO TABS: ORAL_TABLET | ORAL | Status: DC

## 2014-01-02 NOTE — Telephone Encounter (Signed)
[facility] 

## 2014-01-05 ENCOUNTER — Non-Acute Institutional Stay (SKILLED_NURSING_FACILITY): Payer: Commercial Managed Care - HMO | Admitting: Adult Health

## 2014-01-05 ENCOUNTER — Encounter: Payer: Self-pay | Admitting: Adult Health

## 2014-01-05 DIAGNOSIS — I5022 Chronic systolic (congestive) heart failure: Secondary | ICD-10-CM

## 2014-01-05 DIAGNOSIS — I480 Paroxysmal atrial fibrillation: Secondary | ICD-10-CM

## 2014-01-05 DIAGNOSIS — I10 Essential (primary) hypertension: Secondary | ICD-10-CM

## 2014-01-05 DIAGNOSIS — N3000 Acute cystitis without hematuria: Secondary | ICD-10-CM

## 2014-01-05 DIAGNOSIS — S72002D Fracture of unspecified part of neck of left femur, subsequent encounter for closed fracture with routine healing: Secondary | ICD-10-CM

## 2014-01-05 NOTE — Progress Notes (Signed)
Patient ID: [name], female   DOB: 08/15/1929, 80 y.o.   MRN: [id number]  01/05/2014  Facility:  Nursing Home Location:  [place] Room Number: 1008-P LEVEL OF CARE:  SNF (31)   Chief Complaint  Patient presents with  . Discharge Note    HISTORY OF PRESENT ILLNESS:  This is an 80 year old female who is for discharge home with Home health PT, OT and Nursing. She has been admitted to [facility] on 12/22/13 from [facility] with left hip fracture status post hemiarthroplasty. Patient was admitted to this facility for short-term rehabilitation after the patient's recent hospitalization.  Patient has completed SNF rehabilitation and therapy has cleared the patient for discharge.  REASSESSMENT OF ONGOING PROBLEMS:  UTI: The UTI remains stable.  The patient denies ongoing suprapubic pain, flank pain, dysuria, urinary frequency, urinary hesitancy or hematuria. Day-1 of post ATB - Macrobid  ATRIAL FIBRILLATION: the patients atrial fibrillation remains stable.  The patient denies DOE, tachycardia, orthopnea, transient neurological sx, palpitations, & PNDs.  No complications noted from the medications currently being used.  HTN: Pt 's HTN remains stable.  Denies CP, sob, DOE, headaches, dizziness or visual disturbances.  No complications from the medications currently being used.  Last BP : 125/70   PAST MEDICAL HISTORY:  Past Medical History  Diagnosis Date  . Nonischemic cardiomyopathy     Cath without coronary disease 2006  . Systolic CHF   . Hyperlipidemia   . Hypertension   . Osteoporosis   . Urge incontinence   . Raynaud phenomenon   . PAF (paroxysmal atrial fibrillation)     Short bursts of PAF. Asymptomatic.  Sees Dr Rex Kras.  . Biventricular ICD (implantable cardioverter-defibrillator)-[employer]     Date of implant 2007 generator change 2012  . Sinus tachycardia   . Macro dislodgment of the left ventricular lead 09/09/2012    CURRENT  MEDICATIONS: Reviewed per MAR/see medication list  Allergies  Allergen Reactions  . Sulfamethoxazole     REACTION: unspecified     REVIEW OF SYSTEMS:  GENERAL: no change in appetite, no fatigue, no weight changes, no fever, chills or weakness RESPIRATORY: no cough, SOB, DOE, wheezing, hemoptysis CARDIAC: no chest pain, or palpitations, + edema GI: no abdominal pain, diarrhea, constipation, heart burn, nausea or vomiting  PHYSICAL EXAMINATION  GENERAL: no acute distress, normal body habitus NECK: supple, trachea midline, no neck masses, no thyroid tenderness, no thyromegaly LYMPHATICS: no LAN in the neck, no supraclavicular LAN RESPIRATORY: breathing is even & unlabored, BS CTAB CARDIAC: Heart rate is irregularly irregular, no murmur,no extra heart sounds, LLE edema 2+, left chest pacemaker GI: abdomen soft, normal BS, no masses, no tenderness, no hepatomegaly, no splenomegaly EXTREMITIES:  Able to move x4 extremities; ambulates with walker PSYCHIATRIC: the patient is alert & oriented to person, affect & behavior appropriate  LABS/RADIOLOGY: 12/28/13  WBC 8.9 hemoglobin 11.2 hematocrit 35.8 MCV 99.7 Labs reviewed: Basic Metabolic Panel:  Recent Labs  12/19/13 2126 12/20/13 0449 12/21/13 0520 12/22/13 0644  NA  --  136* 135* 135*  K  --  3.8 3.9 4.3  CL  --  102 98 98  CO2  --  23 23 30   GLUCOSE  --  112* 118* 118*  BUN  --  9 5* 4*  CREATININE  --  0.65 0.58 0.53  CALCIUM  --  8.2* 8.4 8.7  MG 1.6  --   --   --  Liver Function Tests:  Recent Labs  10/26/13 0923 12/19/13 1414  AST 18 21  ALT 11 13  ALKPHOS 60 67  BILITOT 0.6 0.4  PROT 6.5 6.4  ALBUMIN 4.0 3.4*   CBC:  Recent Labs  03/21/13 0758  12/19/13 1414 12/20/13 0449 12/21/13 0520 12/22/13 0644  WBC 8.0  < > 7.1 7.5 8.1 7.1  NEUTROABS 4.6  --  5.1  --   --   --   HGB 14.2  < > 14.6 13.1 12.6 11.7*  HCT 42.5  < > 43.1 38.9 37.9 35.1*  MCV 90.9  < > 94.5 94.9 96.9 95.6  PLT 131.0*  < >  144* 110* 101* 97*  < > = values in this interval not displayed.  Dg Hip Bilateral W/pelvis  12/19/2013   CLINICAL DATA:  LEFT hip pain post fall today  EXAM: BILATERAL HIP WITH PELVIS - 4+ VIEW  COMPARISON:  CT abdomen and pelvis 09/28/2008  FINDINGS: Osseous demineralization.  Displaced LEFT femoral neck fracture.  No dislocation.  Hip and SI joint spaces preserved.  Foreign body projects over pelvis.  SI joints symmetric.  Degenerative disc disease changes lumbar spine.  IMPRESSION: Displaced LEFT femoral neck fracture.  Osseous demineralization.   Electronically Signed   By: Lavonia Dana M.D.   On: 12/19/2013 16:00   Ct Head Wo Contrast  12/19/2013   CLINICAL DATA:  [address] at [place] office injuring LEFT side, struck LEFT side of head, denies headache and neck pain, past history of CHF, non ischemic cardiomyopathy, hypertension  EXAM: CT HEAD WITHOUT CONTRAST  CT CERVICAL SPINE WITHOUT CONTRAST  TECHNIQUE: Multidetector CT imaging of the head and cervical spine was performed following the standard protocol without intravenous contrast. Multiplanar CT image reconstructions of the cervical spine were also generated.  COMPARISON:  None  FINDINGS: CT HEAD FINDINGS  Generalized atrophy.  Normal ventricular morphology.  No midline shift or mass effect.  Small vessel chronic ischemic changes of deep cerebral white matter.  No intracranial hemorrhage, mass lesion, or acute infarction.  Visualized paranasal sinuses and mastoid air cells clear.  Bones unremarkable.  CT CERVICAL SPINE FINDINGS  Osseous demineralization.  Visualized skullbase intact.  Beam hardening artifacts of dental origin.  Atherosclerotic calcifications of the LEFT carotid system and aortic arch.  Peribronchial thickening with emphysematous changes and interstitial/septal thickening in the upper lobes.  Prevertebral soft tissues normal thickness.  Disc space narrowing with endplate spur formation throughout cervical region.  Retrolisthesis at  C4-C5 and minimally C3-C4.  Mild anterolisthesis at C6-C7.  Vertebral body heights maintained without fracture or subluxation.  Scattered facet degenerative changes.  IMPRESSION: Atrophy with small vessel chronic ischemic changes of deep cerebral white matter.  No acute intracranial abnormalities.  Osseous demineralization with multilevel degenerative disc and facet disease changes of the cervical spine.  Scattered mild listheses as above, felt to be degenerative in origin.  No definite acute cervical spine abnormalities.  Emphysematous, bronchitic and interstitial changes at BILATERAL upper lobes.   Electronically Signed   By: Lavonia Dana M.D.   On: 12/19/2013 15:35   Ct Cervical Spine Wo Contrast  12/19/2013   CLINICAL DATA:  [address] at [place] office injuring LEFT side, struck LEFT side of head, denies headache and neck pain, past history of CHF, non ischemic cardiomyopathy, hypertension  EXAM: CT HEAD WITHOUT CONTRAST  CT CERVICAL SPINE WITHOUT CONTRAST  TECHNIQUE: Multidetector CT imaging of the head and cervical spine was performed following the standard protocol without intravenous  contrast. Multiplanar CT image reconstructions of the cervical spine were also generated.  COMPARISON:  None  FINDINGS: CT HEAD FINDINGS  Generalized atrophy.  Normal ventricular morphology.  No midline shift or mass effect.  Small vessel chronic ischemic changes of deep cerebral white matter.  No intracranial hemorrhage, mass lesion, or acute infarction.  Visualized paranasal sinuses and mastoid air cells clear.  Bones unremarkable.  CT CERVICAL SPINE FINDINGS  Osseous demineralization.  Visualized skullbase intact.  Beam hardening artifacts of dental origin.  Atherosclerotic calcifications of the LEFT carotid system and aortic arch.  Peribronchial thickening with emphysematous changes and interstitial/septal thickening in the upper lobes.  Prevertebral soft tissues normal thickness.  Disc space narrowing with endplate spur  formation throughout cervical region.  Retrolisthesis at C4-C5 and minimally C3-C4.  Mild anterolisthesis at C6-C7.  Vertebral body heights maintained without fracture or subluxation.  Scattered facet degenerative changes.  IMPRESSION: Atrophy with small vessel chronic ischemic changes of deep cerebral white matter.  No acute intracranial abnormalities.  Osseous demineralization with multilevel degenerative disc and facet disease changes of the cervical spine.  Scattered mild listheses as above, felt to be degenerative in origin.  No definite acute cervical spine abnormalities.  Emphysematous, bronchitic and interstitial changes at BILATERAL upper lobes.   Electronically Signed   By: Lavonia Dana M.D.   On: 12/19/2013 15:35   Dg Pelvis Portable  12/21/2013   CLINICAL DATA:  Postop left hip replacement  EXAM: PORTABLE PELVIS 1-2 VIEWS  COMPARISON:  12/19/2013  FINDINGS: Unipolar left hip prosthesis with anticipated postsurgical change surrounding the joint and component in anticipated position.  IMPRESSION: Postoperative change   Electronically Signed   By: Skipper Cliche M.D.   On: 12/21/2013 02:32    ASSESSMENT/PLAN:  Left hip fracture status post hemiarthroplasty - for home health PT, OT and nursing Systolic CHF - stable Hypertension - well controlled; continue Coreg Osteoporosis - continue calcium citrate Hyperlipidemia - patient not interested in starting a statin UTI - day 1 post atb -  finished Macrobid x7 days Atrial fibrillation - rate controlled   I have filled out patient's discharge paperwork and written prescriptions.  Patient will receive home health PT, OT and Nursing.   Total discharge time:Less than 30 minutes  Discharge time involved coordination of the discharge process with social worker, nursing staff and therapy department. Medical justification for home health services verified.    CPT CODE: 28786    MEDINA-VARGAS,[place], [facility] [phone]

## 2014-01-08 DIAGNOSIS — I480 Paroxysmal atrial fibrillation: Secondary | ICD-10-CM

## 2014-01-08 DIAGNOSIS — S72002D Fracture of unspecified part of neck of left femur, subsequent encounter for closed fracture with routine healing: Secondary | ICD-10-CM

## 2014-01-08 DIAGNOSIS — I5032 Chronic diastolic (congestive) heart failure: Secondary | ICD-10-CM

## 2014-01-08 DIAGNOSIS — I10 Essential (primary) hypertension: Secondary | ICD-10-CM

## 2014-01-17 ENCOUNTER — Encounter: Payer: Self-pay | Admitting: Internal Medicine

## 2014-01-17 NOTE — Telephone Encounter (Signed)
This encounter was created in error - please disregard.

## 2014-01-17 NOTE — Telephone Encounter (Deleted)
New Message     Pt calling stating that she had hip surgery in Sept and has been on different meds given by her surgeon. Pt is c/o of high blood pressure (106/84 & 113/74) and wants to know if her heart medications need to be changed. Pt states she still isn't well enough from her surgery to come to the office and be seen but would like some guidance. Please call pt back and advise.

## 2014-03-01 ENCOUNTER — Encounter: Payer: Self-pay | Admitting: Internal Medicine

## 2014-03-09 ENCOUNTER — Encounter (HOSPITAL_COMMUNITY): Payer: Self-pay | Admitting: Cardiovascular Disease

## 2014-03-10 ENCOUNTER — Ambulatory Visit (INDEPENDENT_AMBULATORY_CARE_PROVIDER_SITE_OTHER): Payer: Commercial Managed Care - HMO | Admitting: Internal Medicine

## 2014-03-10 ENCOUNTER — Encounter: Payer: Self-pay | Admitting: Internal Medicine

## 2014-03-10 VITALS — BP 132/64 | HR 104 | Ht 64.0 in | Wt 109.8 lb

## 2014-03-10 DIAGNOSIS — R432 Parageusia: Secondary | ICD-10-CM

## 2014-03-10 DIAGNOSIS — I5022 Chronic systolic (congestive) heart failure: Secondary | ICD-10-CM

## 2014-03-10 DIAGNOSIS — Z95810 Presence of automatic (implantable) cardiac defibrillator: Secondary | ICD-10-CM

## 2014-03-10 DIAGNOSIS — Z45018 Encounter for adjustment and management of other part of cardiac pacemaker: Secondary | ICD-10-CM

## 2014-03-10 DIAGNOSIS — I429 Cardiomyopathy, unspecified: Secondary | ICD-10-CM

## 2014-03-10 DIAGNOSIS — I428 Other cardiomyopathies: Secondary | ICD-10-CM

## 2014-03-10 LAB — MDC_IDC_ENUM_SESS_TYPE_INCLINIC
Battery Voltage: 2.98 V
Brady Statistic RA Percent Paced: 0.08 %
Date Time Interrogation Session: 20151211104121
Implantable Pulse Generator Model: 3242
Implantable Pulse Generator Serial Number: 7554613
Lead Channel Impedance Value: 362.5 Ohm
Lead Channel Impedance Value: 475 Ohm
Lead Channel Impedance Value: 475 Ohm
Lead Channel Pacing Threshold Amplitude: 0.75 V
Lead Channel Pacing Threshold Amplitude: 0.75 V
Lead Channel Pacing Threshold Amplitude: 1 V
Lead Channel Pacing Threshold Amplitude: 1.125 V
Lead Channel Pacing Threshold Pulse Width: 0.4 ms
Lead Channel Pacing Threshold Pulse Width: 0.4 ms
Lead Channel Pacing Threshold Pulse Width: 0.4 ms
Lead Channel Pacing Threshold Pulse Width: 0.4 ms
Lead Channel Pacing Threshold Pulse Width: 0.7 ms
Lead Channel Sensing Intrinsic Amplitude: 3.4 mV
Lead Channel Setting Pacing Amplitude: 2 V
Lead Channel Setting Pacing Amplitude: 2.125
Lead Channel Setting Pacing Pulse Width: 0.7 ms
MDC IDC MSMT BATTERY REMAINING LONGEVITY: 73.2 mo
MDC IDC MSMT LEADCHNL RV PACING THRESHOLD AMPLITUDE: 1 V
MDC IDC MSMT LEADCHNL RV SENSING INTR AMPL: 9.9 mV
MDC IDC SET LEADCHNL RV PACING AMPLITUDE: 2.5 V
MDC IDC SET LEADCHNL RV PACING PULSEWIDTH: 0.4 ms
MDC IDC SET LEADCHNL RV SENSING SENSITIVITY: 2 mV
MDC IDC STAT BRADY RV PERCENT PACED: 93 %

## 2014-03-10 LAB — CBC WITH DIFFERENTIAL/PLATELET
Basophils Absolute: 0.1 10*3/uL (ref 0.0–0.1)
Basophils Relative: 0.9 % (ref 0.0–3.0)
Eosinophils Absolute: 0.1 10*3/uL (ref 0.0–0.7)
Eosinophils Relative: 1.3 % (ref 0.0–5.0)
HEMATOCRIT: 43.2 % (ref 36.0–46.0)
Hemoglobin: 13.9 g/dL (ref 12.0–15.0)
LYMPHS ABS: 2.4 10*3/uL (ref 0.7–4.0)
Lymphocytes Relative: 31.4 % (ref 12.0–46.0)
MCHC: 32.3 g/dL (ref 30.0–36.0)
MCV: 89.5 fl (ref 78.0–100.0)
MONO ABS: 0.9 10*3/uL (ref 0.1–1.0)
Monocytes Relative: 11.2 % (ref 3.0–12.0)
Neutro Abs: 4.2 10*3/uL (ref 1.4–7.7)
Neutrophils Relative %: 55.2 % (ref 43.0–77.0)
PLATELETS: 189 10*3/uL (ref 150.0–400.0)
RBC: 4.83 Mil/uL (ref 3.87–5.11)
RDW: 15.3 % (ref 11.5–15.5)
WBC: 7.7 10*3/uL (ref 4.0–10.5)

## 2014-03-10 LAB — BASIC METABOLIC PANEL
BUN: 14 mg/dL (ref 6–23)
CHLORIDE: 101 meq/L (ref 96–112)
CO2: 25 meq/L (ref 19–32)
Calcium: 9.8 mg/dL (ref 8.4–10.5)
Creatinine, Ser: 0.7 mg/dL (ref 0.4–1.2)
GFR: 82.39 mL/min (ref >60.00)
GLUCOSE: 92 mg/dL (ref 70–99)
POTASSIUM: 5 meq/L (ref 3.5–5.1)
Sodium: 133 mEq/L — ABNORMAL LOW (ref 135–145)

## 2014-03-10 LAB — TSH: TSH: 1.85 u[IU]/mL (ref 0.35–4.50)

## 2014-03-10 MED ORDER — CARVEDILOL 3.125 MG PO TABS: 3.1250 mg | ORAL_TABLET | Freq: Two times a day (BID) | ORAL | Status: DC

## 2014-03-10 MED ORDER — FUROSEMIDE 40 MG PO TABS: ORAL_TABLET | ORAL | Status: DC

## 2014-03-10 NOTE — Patient Instructions (Signed)
Your physician has recommended you make the following change in your medication:  1) RESTART Carvedilol 3.125 mg twice a day 2) INCREASE Lasix to 40 mg 2 or 3 times per week  Labs today: BMET, TSH, CBCD  Remote monitoring is used to monitor your Pacemaker of ICD from home. This monitoring reduces the number of office visits required to check your device to one time per year. It allows [place] to keep an eye on the functioning of your device to ensure it is working properly. You are scheduled for a device check from home on 06/12/14. You may send your transmission at any time that day. If you have a wireless device, the transmission will be sent automatically. After your physician reviews your transmission, you will receive a postcard with your next transmission date.  Your physician wants you to follow-up in: 6 months with Dr. Caryl Comes.  You will receive a reminder letter in the mail two months in advance. If you don't receive a letter, please call our office to schedule the follow-up appointment.

## 2014-03-10 NOTE — Progress Notes (Signed)
Patient Care Team: [name], MD as PCP - General (Pediatrics) Deboraha Sprang, MD as Consulting Physician (Cardiology)   HPI  [name] is a 80 y.o. female  Seen in followup for CHF with previous CRT with LV lead dislodgement confirmed by CXR  in December she underwent downgrade of her device for ICD-->>   this is pacemaker insertion of a new LV lead.   Efforts for afterload reduction complicated by hypotension  She underwent cath for the above and chest pain>>LV dysfunction mod 35-40% with [place] MR   Her biggest complaint now is dysgeusia. She wonders whether it is related to some of her medications. We tried a serial exclusion trial without any real impact. Unfortunately about 10 days after we saw her she fell and broke her hip. She had a very unpleasant experience at [facility]. She has tried to relate her discontent to the powers that be      Past Medical History  Diagnosis Date  . Nonischemic cardiomyopathy     Cath without coronary disease 2006  . Systolic CHF   . Hyperlipidemia   . Hypertension   . Osteoporosis   . Urge incontinence   . Raynaud phenomenon   . PAF (paroxysmal atrial fibrillation)     Short bursts of PAF. Asymptomatic.  Sees Dr Rex Kras.  . Biventricular ICD (implantable cardioverter-defibrillator)-[employer]     Date of implant 2007 generator change 2012  . Sinus tachycardia   . Macro dislodgment of the left ventricular lead 09/09/2012    Past Surgical History  Procedure Laterality Date  . Appendectomy    . Biventricular icd implant  06/28/2010; 03/28/2013    STJ CRTD implanted 2012 with dislodgement of LV lead 2014; 03/28/2013 CRTP placed with new LV lead by Dr Caryl Comes  . Bladder tack    . Cardiac catheterization  03/2005    EF 25%, Normal arteries, Nonischemic CM  . Abdominal hysterectomy  1970s  . Suprapubic urethrovesicular suspension  1992  . Hip arthroplasty Left 12/20/2013    Procedure: LEFT HIP HEMI ARTHROPLASTY;   Surgeon: Meredith Pel, MD;  Location: [place];  Service: Orthopedics;  Laterality: Left;  . Left and right heart catheterization with coronary angiogram N/A 12/13/2012    Procedure: LEFT AND RIGHT HEART CATHETERIZATION WITH CORONARY ANGIOGRAM;  Surgeon: Blane Ohara, MD;  Location: [facility] CATH LAB;  Service: Cardiovascular;  Laterality: N/A;  . Bi-ventricular pacemaker insertion N/A 03/28/2013    Procedure: BI-VENTRICULAR PACEMAKER INSERTION (CRT-P);  Surgeon: Deboraha Sprang, MD;  Location: [facility] CATH LAB;  Service: Cardiovascular;  Laterality: N/A;    Current Outpatient Prescriptions  Medication Sig Dispense Refill  . acetaminophen (TYLENOL) 325 MG tablet Take 2 tablets (650 mg total) by mouth every 6 (six) hours. 240 tablet 0  . aspirin 81 MG EC tablet Take 81 mg by mouth daily.     . Cranberry (SM CRANBERRY) 300 MG tablet Take 600 mg by mouth 2 (two) times daily.     . furosemide (LASIX) 40 MG tablet Take 1 tablet (40 mg total) by mouth daily as needed for fluid. 30 tablet 3   No current facility-administered medications for this visit.    Allergies  Allergen Reactions  . Sulfamethoxazole     REACTION: unspecified    Review of Systems negative except from HPI and PMH  Physical Exam BP 132/64 mmHg  Pulse 104  Ht 5\' 4"  (1.626 m)  Wt 109 lb 12.8 oz (  49.805 kg)  BMI 18.84 kg/m2  Well developed and nourished in no acute distress HENT normal Neck supple with JVP-<10 Clear Regular but rapid rate and rhythm, no murmurs or gallops Abd-soft with active BS No Clubbing cyanosis 2+ aymmmetiricedema Skin-warm and dry A & Oriented  Grossly normal sensory and motor function  ECG demonstrates sinus rhythm with PACs versus pacing at a rate of 104   Assessment and plan  Nonischemic cardiomyopathy  Congestive heart failure-chronic systolic  Implantable device-CRT>>> defibrillator-->pacemaker The patient's device was interrogated and the information was fully reviewed.  The device was  reprogrammed to increase the max tracking rate  Sinus tachycardia  Dysgeusia  Dysgeusia is somewhat better. She came off of most of her medications following her hospitalization and feels better.  Given her sinus tachycardia, this is clearly right shifted compared to her data from 9/15, we will check for triggers i.e. thyroid CBC and electrolyte abnormalities. She is willing to resume her beta blocker. We will started a carvedilol 3.125 twice daily.  Her heart failure issue is worse. We will resume her Lasix on a 2-3 times a week basis compared to 2-3 times a month.

## 2014-03-14 ENCOUNTER — Encounter: Payer: Self-pay | Admitting: Internal Medicine

## 2014-03-14 NOTE — Telephone Encounter (Signed)
This encounter was created in error - please disregard.

## 2014-03-14 NOTE — Telephone Encounter (Signed)
New Msg     Pt returning call about labs, please call

## 2014-05-02 ENCOUNTER — Telehealth: Payer: Self-pay | Admitting: Family Medicine

## 2014-05-02 NOTE — Telephone Encounter (Signed)
[employer] rep is calling to see if there was an approval for patient to see Dr. Vira Blanco on 03/10/14? They do not show an approval and are need this or the patient will be billed. Please call them at [phone] and ext [phone]. jw

## 2014-05-03 NOTE — Telephone Encounter (Signed)
Left message on voicemail for Ms. Riddle that our office hasnt processed a referral for patient since September. Asked that she call back with any questions.

## 2014-05-10 ENCOUNTER — Telehealth: Payer: Self-pay | Admitting: Family Medicine

## 2014-05-10 DIAGNOSIS — S72002S Fracture of unspecified part of neck of left femur, sequela: Secondary | ICD-10-CM

## 2014-05-10 NOTE — Telephone Encounter (Signed)
Received faxed request from [facility] that pt needs a bone mineral density test scheduled due to her recent hip fracture. Called and discussed this recommendation with pt. She is wary of taking medications for osteoporosis but is willing to do the test to appease her insurance carrier. She's also planning to schedule a f/u appt with me in the next month or so.  Red team, please schedule pt for a DEXA scan some time within the next few weeks and call her with the appointment time. Pt prefers an afternoon appointment.   Thanks! [name], MD

## 2014-05-17 ENCOUNTER — Other Ambulatory Visit: Payer: Self-pay | Admitting: Family Medicine

## 2014-05-17 DIAGNOSIS — M810 Age-related osteoporosis without current pathological fracture: Secondary | ICD-10-CM

## 2014-05-17 NOTE — Telephone Encounter (Signed)
Pt scheduled at the breast center on Monday feb 22 @ 1:30. Must arrive @ 1:15. Pt informed. Tyjay Galindo Kennon Holter, CMA

## 2014-05-22 ENCOUNTER — Ambulatory Visit
Admission: RE | Admit: 2014-05-22 | Discharge: 2014-05-22 | Disposition: A | Discharge status: Home / Self Care | Payer: Commercial Managed Care - HMO | Source: Ambulatory Visit | Attending: Family Medicine | Admitting: Family Medicine

## 2014-05-22 DIAGNOSIS — M810 Age-related osteoporosis without current pathological fracture: Secondary | ICD-10-CM | POA: Diagnosis not present

## 2014-05-29 ENCOUNTER — Telehealth: Payer: Self-pay | Admitting: Family Medicine

## 2014-05-29 NOTE — Telephone Encounter (Signed)
[employer]: would like the results of the bone density test faxed to them [phone]

## 2014-05-30 NOTE — Telephone Encounter (Signed)
Results faxed to number provided.

## 2014-06-08 ENCOUNTER — Telehealth: Payer: Self-pay | Admitting: Cardiology

## 2014-06-08 NOTE — Telephone Encounter (Signed)
Spoke w/ pt and rescheduled her remote transmission for March.

## 2014-06-15 ENCOUNTER — Encounter: Payer: Self-pay | Admitting: Family Medicine

## 2014-06-15 ENCOUNTER — Ambulatory Visit (INDEPENDENT_AMBULATORY_CARE_PROVIDER_SITE_OTHER): Payer: Commercial Managed Care - HMO | Admitting: Family Medicine

## 2014-06-15 VITALS — BP 147/85 | HR 94 | Temp 98.0°F | Ht 64.0 in | Wt 113.6 lb

## 2014-06-15 DIAGNOSIS — M810 Age-related osteoporosis without current pathological fracture: Secondary | ICD-10-CM

## 2014-06-15 DIAGNOSIS — M7150 Other bursitis, not elsewhere classified, unspecified site: Secondary | ICD-10-CM | POA: Diagnosis not present

## 2014-06-15 DIAGNOSIS — M7050 Other bursitis of knee, unspecified knee: Secondary | ICD-10-CM

## 2014-06-15 MED ORDER — ALENDRONATE SODIUM 70 MG PO TABS: 70.0000 mg | ORAL_TABLET | ORAL | Status: DC

## 2014-06-15 NOTE — Progress Notes (Signed)
Patient ID: [name], female   DOB: 1929-04-28, 81 y.o.   MRN: [id number]  HPI:  Follow-up DEXA scan: DEXA scan showed osteoporosis. Patient had a hip fracture back in the fall. She is not on any treatment for osteoporosis at present. She wants to avoid taking medications if she can. She is willing to try a bisphosphonate if it is affordable. She is going to the senior center 5 times a week for exercise. She works on balance, strength, and yoga.  Jaw pain: Has had some pain in her right jaw. 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 He wanted to know if any CT scans were obtained at that time. Patient is not sure if they were done.  Left knee pain: Occasionally has pain in her left knee. Has gotten cortisone shots in the past which has helped. This is not flaring right now. She just wants to know if this would be an option for her in the future. She does have some mild swelling over the medial aspect of her lower left knee. This happens on occasion.  ROS: See HPI.  [place]: History of osteoporosis, hyperlipidemia, hypertension, ICD, nonischemic cardiomyopathy, chronic systolic heart failure  PHYSICAL EXAM: BP 147/85 mmHg  Pulse 94  Temp(Src) 98 F (36.7 C) (Oral)  Ht 5\' 4"  (1.626 m)  Wt 113 lb 9.6 oz (51.529 kg)  BMI 19.49 kg/m2 Gen: NAD, pleasant, cooperative HEENT: NCAT, speech normal, moves jaw normally Neuro: grossly nonfocal speech normal Ext: Area of mild chronic boggy swelling overlying the pes anserine bursa on left knee. Knee is otherwise without effusion.  ASSESSMENT/PLAN:  Osteoporosis Willing to try bisphosphonate. I sent in a prescription for alendronate. Patient will look into whether this is affordable for her. We discussed that the benefits of this to her given her age of 25 are mild, and if the side effects outweigh the benefit for her than it is reasonable for her not to take them. She will give it a try. I also  discussed with her the need to repeat her DEXA scan in 1 year if she does take the medication.   Pes anserine bursitis Not in acute flare. Suspect this is what has intermittently flared for patient. Encouraged her that if she has increased pain in the future she can always come in and we can consider steroid injection.   Jaw pain Reviewed imaging obtained at the time of patient's fall and hip fracture. She had a CT scan of her head and neck which did not show any acute abnormalities. Patient was reassured to hear this.  FOLLOW UP: F/u as needed F/u in 1 yr for DEXA if takes alendronate  [place] J. [name], [place]

## 2014-06-15 NOTE — Patient Instructions (Signed)
It was great to see you again today!  I sent in a prescription for alendronate.  Take it with plain water in the morning Wait 30 minutes to eat or drink anything else, or take any other medicines Stay sitting upright for at least 30 minutes AND until you eat something. We need to repeat your bone density test in 1 year if you decide to take it.  Be well, Dr. [name]

## 2014-06-16 DIAGNOSIS — M7050 Other bursitis of knee, unspecified knee: Secondary | ICD-10-CM | POA: Insufficient documentation

## 2014-06-16 NOTE — Assessment & Plan Note (Addendum)
Willing to try bisphosphonate. I sent in a prescription for alendronate. Patient will look into whether this is affordable for her. We discussed that the benefits of this to her given her age of 63 are mild, and if the side effects outweigh the benefit for her than it is reasonable for her not to take them. She will give it a try. I also discussed with her the need to repeat her DEXA scan in 1 year if she does take the medication.

## 2014-06-16 NOTE — Assessment & Plan Note (Signed)
Not in acute flare. Suspect this is what has intermittently flared for patient. Encouraged her that if she has increased pain in the future she can always come in and we can consider steroid injection.

## 2014-06-22 ENCOUNTER — Ambulatory Visit (INDEPENDENT_AMBULATORY_CARE_PROVIDER_SITE_OTHER): Payer: Commercial Managed Care - HMO | Admitting: *Deleted

## 2014-06-22 DIAGNOSIS — I428 Other cardiomyopathies: Secondary | ICD-10-CM

## 2014-06-22 DIAGNOSIS — I429 Cardiomyopathy, unspecified: Secondary | ICD-10-CM | POA: Diagnosis not present

## 2014-06-22 NOTE — Progress Notes (Signed)
Remote pacemaker transmission.   

## 2014-06-25 LAB — MDC_IDC_ENUM_SESS_TYPE_REMOTE
Battery Remaining Percentage: 90 %
Brady Statistic AP VS Percent: 1 %
Brady Statistic AS VP Percent: 98 %
Brady Statistic RA Percent Paced: 1 %
Date Time Interrogation Session: 20160324060018
Implantable Pulse Generator Model: 3242
Lead Channel Impedance Value: 430 Ohm
Lead Channel Impedance Value: 440 Ohm
Lead Channel Pacing Threshold Amplitude: 0.75 V
Lead Channel Pacing Threshold Amplitude: 1.125 V
Lead Channel Pacing Threshold Pulse Width: 0.4 ms
Lead Channel Pacing Threshold Pulse Width: 0.7 ms
Lead Channel Setting Pacing Amplitude: 2 V
Lead Channel Setting Pacing Amplitude: 2.125
Lead Channel Setting Pacing Pulse Width: 0.7 ms
Lead Channel Setting Sensing Sensitivity: 2 mV
MDC IDC MSMT BATTERY REMAINING LONGEVITY: 69 mo
MDC IDC MSMT BATTERY VOLTAGE: 2.98 V
MDC IDC MSMT LEADCHNL RA IMPEDANCE VALUE: 310 Ohm
MDC IDC MSMT LEADCHNL RA SENSING INTR AMPL: 2 mV
MDC IDC MSMT LEADCHNL RV PACING THRESHOLD AMPLITUDE: 1 V
MDC IDC MSMT LEADCHNL RV PACING THRESHOLD PULSEWIDTH: 0.4 ms
MDC IDC MSMT LEADCHNL RV SENSING INTR AMPL: 12 mV
MDC IDC PG SERIAL: 7554613
MDC IDC SET LEADCHNL RV PACING AMPLITUDE: 2.5 V
MDC IDC SET LEADCHNL RV PACING PULSEWIDTH: 0.4 ms
MDC IDC STAT BRADY AP VP PERCENT: 1 %
MDC IDC STAT BRADY AS VS PERCENT: 1 %

## 2014-07-05 ENCOUNTER — Encounter: Payer: Self-pay | Admitting: Cardiology

## 2014-07-12 ENCOUNTER — Encounter: Payer: Self-pay | Admitting: Internal Medicine

## 2014-09-04 ENCOUNTER — Inpatient Hospital Stay (HOSPITAL_COMMUNITY): Payer: Commercial Managed Care - HMO | Admitting: Certified Registered"

## 2014-09-04 ENCOUNTER — Inpatient Hospital Stay (HOSPITAL_COMMUNITY): Payer: Commercial Managed Care - HMO

## 2014-09-04 ENCOUNTER — Emergency Department (HOSPITAL_COMMUNITY): Payer: Commercial Managed Care - HMO

## 2014-09-04 ENCOUNTER — Inpatient Hospital Stay (HOSPITAL_COMMUNITY)
Admission: EM | Admit: 2014-09-04 | Discharge: 2014-09-06 | DRG: 470 | Disposition: A | Discharge status: Skilled Nursing Facility | Payer: Commercial Managed Care - HMO | Attending: Family Medicine | Admitting: Family Medicine

## 2014-09-04 ENCOUNTER — Encounter (HOSPITAL_COMMUNITY)
Admission: EM | Disposition: A | Discharge status: Skilled Nursing Facility | Payer: Self-pay | Source: Home / Self Care | Attending: Family Medicine

## 2014-09-04 ENCOUNTER — Encounter (HOSPITAL_COMMUNITY): Payer: Self-pay | Admitting: General Practice

## 2014-09-04 DIAGNOSIS — Z833 Family history of diabetes mellitus: Secondary | ICD-10-CM

## 2014-09-04 DIAGNOSIS — M25551 Pain in right hip: Secondary | ICD-10-CM | POA: Diagnosis not present

## 2014-09-04 DIAGNOSIS — I4891 Unspecified atrial fibrillation: Secondary | ICD-10-CM | POA: Diagnosis not present

## 2014-09-04 DIAGNOSIS — Z95810 Presence of automatic (implantable) cardiac defibrillator: Secondary | ICD-10-CM | POA: Diagnosis not present

## 2014-09-04 DIAGNOSIS — S72041A Displaced fracture of base of neck of right femur, initial encounter for closed fracture: Secondary | ICD-10-CM | POA: Diagnosis not present

## 2014-09-04 DIAGNOSIS — R278 Other lack of coordination: Secondary | ICD-10-CM | POA: Diagnosis not present

## 2014-09-04 DIAGNOSIS — E785 Hyperlipidemia, unspecified: Secondary | ICD-10-CM | POA: Diagnosis present

## 2014-09-04 DIAGNOSIS — Z79899 Other long term (current) drug therapy: Secondary | ICD-10-CM

## 2014-09-04 DIAGNOSIS — W19XXXA Unspecified fall, initial encounter: Secondary | ICD-10-CM

## 2014-09-04 DIAGNOSIS — Z96641 Presence of right artificial hip joint: Secondary | ICD-10-CM

## 2014-09-04 DIAGNOSIS — I480 Paroxysmal atrial fibrillation: Secondary | ICD-10-CM | POA: Diagnosis not present

## 2014-09-04 DIAGNOSIS — W010XXA Fall on same level from slipping, tripping and stumbling without subsequent striking against object, initial encounter: Secondary | ICD-10-CM | POA: Diagnosis present

## 2014-09-04 DIAGNOSIS — I10 Essential (primary) hypertension: Secondary | ICD-10-CM | POA: Diagnosis not present

## 2014-09-04 DIAGNOSIS — S72001A Fracture of unspecified part of neck of right femur, initial encounter for closed fracture: Secondary | ICD-10-CM | POA: Diagnosis not present

## 2014-09-04 DIAGNOSIS — I428 Other cardiomyopathies: Secondary | ICD-10-CM | POA: Diagnosis not present

## 2014-09-04 DIAGNOSIS — Z87891 Personal history of nicotine dependence: Secondary | ICD-10-CM | POA: Diagnosis not present

## 2014-09-04 DIAGNOSIS — Z471 Aftercare following joint replacement surgery: Secondary | ICD-10-CM | POA: Diagnosis not present

## 2014-09-04 DIAGNOSIS — Z66 Do not resuscitate: Secondary | ICD-10-CM | POA: Diagnosis present

## 2014-09-04 DIAGNOSIS — I959 Hypotension, unspecified: Secondary | ICD-10-CM | POA: Diagnosis not present

## 2014-09-04 DIAGNOSIS — R52 Pain, unspecified: Secondary | ICD-10-CM | POA: Diagnosis not present

## 2014-09-04 DIAGNOSIS — I5022 Chronic systolic (congestive) heart failure: Secondary | ICD-10-CM | POA: Diagnosis not present

## 2014-09-04 DIAGNOSIS — I7300 Raynaud's syndrome without gangrene: Secondary | ICD-10-CM | POA: Diagnosis present

## 2014-09-04 DIAGNOSIS — Z8249 Family history of ischemic heart disease and other diseases of the circulatory system: Secondary | ICD-10-CM | POA: Diagnosis not present

## 2014-09-04 DIAGNOSIS — Z96642 Presence of left artificial hip joint: Secondary | ICD-10-CM | POA: Diagnosis present

## 2014-09-04 DIAGNOSIS — S72009A Fracture of unspecified part of neck of unspecified femur, initial encounter for closed fracture: Secondary | ICD-10-CM | POA: Insufficient documentation

## 2014-09-04 DIAGNOSIS — S7290XA Unspecified fracture of unspecified femur, initial encounter for closed fracture: Secondary | ICD-10-CM | POA: Diagnosis present

## 2014-09-04 DIAGNOSIS — F1021 Alcohol dependence, in remission: Secondary | ICD-10-CM | POA: Diagnosis present

## 2014-09-04 DIAGNOSIS — E440 Moderate protein-calorie malnutrition: Secondary | ICD-10-CM | POA: Insufficient documentation

## 2014-09-04 DIAGNOSIS — S299XXA Unspecified injury of thorax, initial encounter: Secondary | ICD-10-CM | POA: Diagnosis not present

## 2014-09-04 DIAGNOSIS — Z950 Presence of cardiac pacemaker: Secondary | ICD-10-CM | POA: Diagnosis not present

## 2014-09-04 DIAGNOSIS — M6281 Muscle weakness (generalized): Secondary | ICD-10-CM | POA: Diagnosis not present

## 2014-09-04 DIAGNOSIS — N3941 Urge incontinence: Secondary | ICD-10-CM | POA: Diagnosis present

## 2014-09-04 DIAGNOSIS — D62 Acute posthemorrhagic anemia: Secondary | ICD-10-CM | POA: Diagnosis not present

## 2014-09-04 DIAGNOSIS — G8918 Other acute postprocedural pain: Secondary | ICD-10-CM | POA: Diagnosis not present

## 2014-09-04 DIAGNOSIS — Z9181 History of falling: Secondary | ICD-10-CM | POA: Diagnosis not present

## 2014-09-04 DIAGNOSIS — G629 Polyneuropathy, unspecified: Secondary | ICD-10-CM | POA: Diagnosis present

## 2014-09-04 DIAGNOSIS — M21251 Flexion deformity, right hip: Secondary | ICD-10-CM | POA: Diagnosis not present

## 2014-09-04 DIAGNOSIS — M810 Age-related osteoporosis without current pathological fracture: Secondary | ICD-10-CM | POA: Diagnosis present

## 2014-09-04 DIAGNOSIS — R262 Difficulty in walking, not elsewhere classified: Secondary | ICD-10-CM | POA: Diagnosis not present

## 2014-09-04 DIAGNOSIS — Y9301 Activity, walking, marching and hiking: Secondary | ICD-10-CM

## 2014-09-04 DIAGNOSIS — Z7982 Long term (current) use of aspirin: Secondary | ICD-10-CM

## 2014-09-04 DIAGNOSIS — Z419 Encounter for procedure for purposes other than remedying health state, unspecified: Secondary | ICD-10-CM

## 2014-09-04 HISTORY — PX: TOTAL HIP ARTHROPLASTY: SHX124

## 2014-09-04 LAB — SURGICAL PCR SCREEN
MRSA, PCR: NEGATIVE
STAPHYLOCOCCUS AUREUS: NEGATIVE

## 2014-09-04 LAB — TYPE AND SCREEN
ABO/RH(D): O POS
Antibody Screen: NEGATIVE

## 2014-09-04 LAB — CBC WITH DIFFERENTIAL/PLATELET
BASOS ABS: 0.1 10*3/uL (ref 0.0–0.1)
Basophils Relative: 1 % (ref 0–1)
EOS PCT: 2 % (ref 0–5)
Eosinophils Absolute: 0.2 10*3/uL (ref 0.0–0.7)
HCT: 43.9 % (ref 36.0–46.0)
Hemoglobin: 14.8 g/dL (ref 12.0–15.0)
Lymphocytes Relative: 21 % (ref 12–46)
Lymphs Abs: 1.8 10*3/uL (ref 0.7–4.0)
MCH: 30.9 pg (ref 26.0–34.0)
MCHC: 33.7 g/dL (ref 30.0–36.0)
MCV: 91.6 fL (ref 78.0–100.0)
Monocytes Absolute: 0.9 10*3/uL (ref 0.1–1.0)
Monocytes Relative: 11 % (ref 3–12)
Neutro Abs: 5.7 10*3/uL (ref 1.7–7.7)
Neutrophils Relative %: 65 % (ref 43–77)
Platelets: 173 10*3/uL (ref 150–400)
RBC: 4.79 MIL/uL (ref 3.87–5.11)
RDW: 14.2 % (ref 11.5–15.5)
WBC: 8.7 10*3/uL (ref 4.0–10.5)

## 2014-09-04 LAB — COMPREHENSIVE METABOLIC PANEL
ALK PHOS: 83 U/L (ref 38–126)
ALT: 17 U/L (ref 14–54)
AST: 26 U/L (ref 15–41)
Albumin: 3.9 g/dL (ref 3.5–5.0)
Anion gap: 12 (ref 5–15)
BUN: 18 mg/dL (ref 6–20)
CO2: 26 mmol/L (ref 22–32)
CREATININE: 0.85 mg/dL (ref 0.44–1.00)
Calcium: 9.6 mg/dL (ref 8.9–10.3)
Chloride: 98 mmol/L — ABNORMAL LOW (ref 101–111)
GFR calc Af Amer: >60 mL/min (ref >60)
GFR calc non Af Amer: >60 mL/min (ref >60)
GLUCOSE: 118 mg/dL — AB (ref 65–99)
POTASSIUM: 4.4 mmol/L (ref 3.5–5.1)
Sodium: 136 mmol/L (ref 135–145)
Total Bilirubin: 0.7 mg/dL (ref 0.3–1.2)
Total Protein: 6.6 g/dL (ref 6.5–8.1)

## 2014-09-04 LAB — PROTIME-INR
INR: 1.02 (ref 0.00–1.49)
Prothrombin Time: 13.6 seconds (ref 11.6–15.2)

## 2014-09-04 SURGERY — ARTHROPLASTY, HIP, TOTAL, ANTERIOR APPROACH
Anesthesia: Spinal | Site: Hip | Laterality: Right

## 2014-09-04 MED ORDER — SODIUM CHLORIDE 0.9 % IJ SOLN
3.0000 mL | Freq: Two times a day (BID) | INTRAMUSCULAR | Status: DC
Start: 2014-09-04 — End: 2014-09-06
  Administered 2014-09-05 – 2014-09-06 (×2): 3 mL via INTRAVENOUS

## 2014-09-04 MED ORDER — MEPERIDINE HCL 25 MG/ML IJ SOLN
6.2500 mg | INTRAMUSCULAR | Status: DC | PRN
  Administered 2014-09-04 (×2): 12.5 mg via INTRAVENOUS

## 2014-09-04 MED ORDER — PHENYLEPHRINE HCL 10 MG/ML IJ SOLN
INTRAMUSCULAR | Status: DC | PRN
  Administered 2014-09-04 (×3): 80 ug via INTRAVENOUS

## 2014-09-04 MED ORDER — CEFAZOLIN SODIUM-DEXTROSE 2-3 GM-% IV SOLR
INTRAVENOUS | Status: AC
  Filled 2014-09-04: qty 50

## 2014-09-04 MED ORDER — PHENOL 1.4 % MT LIQD: 1.0000 | OROMUCOSAL | Status: DC | PRN

## 2014-09-04 MED ORDER — PROPOFOL 10 MG/ML IV BOLUS
INTRAVENOUS | Status: AC
  Filled 2014-09-04: qty 20

## 2014-09-04 MED ORDER — HYDROMORPHONE HCL 1 MG/ML IJ SOLN
0.5000 mg | INTRAMUSCULAR | Status: DC | PRN
  Administered 2014-09-05: 0.5 mg via INTRAVENOUS
  Filled 2014-09-04: qty 1

## 2014-09-04 MED ORDER — ALENDRONATE SODIUM 70 MG PO TABS: 70.0000 mg | ORAL_TABLET | ORAL | Status: DC

## 2014-09-04 MED ORDER — HYDROMORPHONE HCL 1 MG/ML IJ SOLN
1.0000 mg | INTRAMUSCULAR | Status: DC | PRN
Start: 2014-09-04 — End: 2014-09-04
  Administered 2014-09-04 (×2): 1 mg via INTRAVENOUS
  Filled 2014-09-04: qty 1

## 2014-09-04 MED ORDER — ZOLPIDEM TARTRATE 5 MG PO TABS: 5.0000 mg | ORAL_TABLET | Freq: Every evening | ORAL | Status: DC | PRN

## 2014-09-04 MED ORDER — 0.9 % SODIUM CHLORIDE (POUR BTL) OPTIME
TOPICAL | Status: DC | PRN
  Administered 2014-09-04: 1000 mL

## 2014-09-04 MED ORDER — FUROSEMIDE 40 MG PO TABS
40.0000 mg | ORAL_TABLET | ORAL | Status: DC
  Filled 2014-09-04: qty 1

## 2014-09-04 MED ORDER — SODIUM CHLORIDE 0.9 % IV SOLN
INTRAVENOUS | Status: DC
  Administered 2014-09-04: 22:00:00 via INTRAVENOUS

## 2014-09-04 MED ORDER — METHOCARBAMOL 500 MG PO TABS
500.0000 mg | ORAL_TABLET | Freq: Four times a day (QID) | ORAL | Status: DC | PRN
  Administered 2014-09-04 – 2014-09-05 (×2): 500 mg via ORAL

## 2014-09-04 MED ORDER — FENTANYL CITRATE (PF) 250 MCG/5ML IJ SOLN
INTRAMUSCULAR | Status: AC
  Filled 2014-09-04: qty 5

## 2014-09-04 MED ORDER — SODIUM CHLORIDE 0.9 % IR SOLN
Status: DC | PRN
  Administered 2014-09-04: 3000 mL

## 2014-09-04 MED ORDER — ONDANSETRON HCL 4 MG/2ML IJ SOLN
4.0000 mg | Freq: Once | INTRAMUSCULAR | Status: AC
  Administered 2014-09-04: 4 mg via INTRAVENOUS
  Filled 2014-09-04: qty 2

## 2014-09-04 MED ORDER — PROMETHAZINE HCL 25 MG/ML IJ SOLN: 6.2500 mg | INTRAMUSCULAR | Status: DC | PRN

## 2014-09-04 MED ORDER — ONDANSETRON HCL 4 MG/2ML IJ SOLN: 4.0000 mg | Freq: Four times a day (QID) | INTRAMUSCULAR | Status: DC | PRN

## 2014-09-04 MED ORDER — ACETAMINOPHEN 325 MG PO TABS
650.0000 mg | ORAL_TABLET | Freq: Four times a day (QID) | ORAL | Status: DC
  Administered 2014-09-04 – 2014-09-06 (×7): 650 mg via ORAL
  Filled 2014-09-04 (×8): qty 2

## 2014-09-04 MED ORDER — CEFAZOLIN SODIUM-DEXTROSE 2-3 GM-% IV SOLR
2.0000 g | Freq: Once | INTRAVENOUS | Status: AC
  Administered 2014-09-04: 2 g via INTRAVENOUS

## 2014-09-04 MED ORDER — OXYCODONE HCL 5 MG PO TABS
ORAL_TABLET | ORAL | Status: AC
  Administered 2014-09-04: 10 mg via ORAL
  Filled 2014-09-04: qty 2

## 2014-09-04 MED ORDER — METOCLOPRAMIDE HCL 5 MG/ML IJ SOLN
5.0000 mg | Freq: Three times a day (TID) | INTRAMUSCULAR | Status: DC | PRN
Start: 2014-09-04 — End: 2014-09-06

## 2014-09-04 MED ORDER — LACTATED RINGERS IV SOLN
INTRAVENOUS | Status: DC
  Administered 2014-09-04 (×2): via INTRAVENOUS

## 2014-09-04 MED ORDER — METHOCARBAMOL 1000 MG/10ML IJ SOLN
500.0000 mg | Freq: Four times a day (QID) | INTRAVENOUS | Status: DC | PRN
  Filled 2014-09-04: qty 5

## 2014-09-04 MED ORDER — BUPIVACAINE-EPINEPHRINE (PF) 0.5% -1:200000 IJ SOLN
INTRAMUSCULAR | Status: DC | PRN
  Administered 2014-09-04: 25 mL via PERINEURAL

## 2014-09-04 MED ORDER — MENTHOL 3 MG MT LOZG: 1.0000 | LOZENGE | OROMUCOSAL | Status: DC | PRN

## 2014-09-04 MED ORDER — PROPOFOL INFUSION 10 MG/ML OPTIME
INTRAVENOUS | Status: DC | PRN
  Administered 2014-09-04: 50 ug/kg/min via INTRAVENOUS

## 2014-09-04 MED ORDER — ACETAMINOPHEN 650 MG RE SUPP: 650.0000 mg | Freq: Four times a day (QID) | RECTAL | Status: DC | PRN

## 2014-09-04 MED ORDER — SODIUM CHLORIDE 0.9 % IV BOLUS (SEPSIS)
200.0000 mL | Freq: Once | INTRAVENOUS | Status: AC
  Administered 2014-09-04: 200 mL via INTRAVENOUS

## 2014-09-04 MED ORDER — FENTANYL CITRATE (PF) 100 MCG/2ML IJ SOLN
INTRAMUSCULAR | Status: AC
  Administered 2014-09-04: 100 ug via INTRAVENOUS
  Filled 2014-09-04: qty 2

## 2014-09-04 MED ORDER — HYDROMORPHONE HCL 1 MG/ML IJ SOLN
INTRAMUSCULAR | Status: AC
  Administered 2014-09-04: 1 mg via INTRAVENOUS
  Filled 2014-09-04: qty 1

## 2014-09-04 MED ORDER — OXYCODONE HCL 5 MG PO TABS
5.0000 mg | ORAL_TABLET | ORAL | Status: DC | PRN
  Administered 2014-09-04 – 2014-09-06 (×8): 10 mg via ORAL
  Administered 2014-09-06: 5 mg via ORAL
  Administered 2014-09-06: 10 mg via ORAL
  Filled 2014-09-04 (×7): qty 2

## 2014-09-04 MED ORDER — MORPHINE SULFATE 4 MG/ML IJ SOLN
4.0000 mg | INTRAMUSCULAR | Status: AC | PRN
  Administered 2014-09-04 (×2): 4 mg via INTRAVENOUS
  Filled 2014-09-04 (×3): qty 1

## 2014-09-04 MED ORDER — MORPHINE SULFATE 2 MG/ML IJ SOLN: 0.5000 mg | INTRAMUSCULAR | Status: DC | PRN

## 2014-09-04 MED ORDER — ONDANSETRON HCL 4 MG PO TABS: 4.0000 mg | ORAL_TABLET | Freq: Four times a day (QID) | ORAL | Status: DC | PRN

## 2014-09-04 MED ORDER — ASPIRIN EC 325 MG PO TBEC
325.0000 mg | DELAYED_RELEASE_TABLET | Freq: Two times a day (BID) | ORAL | Status: DC
  Administered 2014-09-05 – 2014-09-06 (×4): 325 mg via ORAL
  Filled 2014-09-04 (×4): qty 1

## 2014-09-04 MED ORDER — CARVEDILOL 3.125 MG PO TABS
3.1250 mg | ORAL_TABLET | Freq: Two times a day (BID) | ORAL | Status: DC
  Administered 2014-09-05 – 2014-09-06 (×3): 3.125 mg via ORAL
  Filled 2014-09-04 (×4): qty 1

## 2014-09-04 MED ORDER — LIDOCAINE HCL (CARDIAC) 20 MG/ML IV SOLN
INTRAVENOUS | Status: DC | PRN
  Administered 2014-09-04: 40 mg via INTRAVENOUS

## 2014-09-04 MED ORDER — HYDROMORPHONE HCL 1 MG/ML IJ SOLN
0.5000 mg | Freq: Once | INTRAMUSCULAR | Status: AC
  Administered 2014-09-04: 0.5 mg via INTRAVENOUS
  Filled 2014-09-04: qty 1

## 2014-09-04 MED ORDER — ACETAMINOPHEN 325 MG PO TABS
650.0000 mg | ORAL_TABLET | Freq: Four times a day (QID) | ORAL | Status: DC | PRN
  Administered 2014-09-06: 650 mg via ORAL

## 2014-09-04 MED ORDER — FENTANYL CITRATE (PF) 100 MCG/2ML IJ SOLN
INTRAMUSCULAR | Status: DC | PRN
  Administered 2014-09-04: 100 ug via INTRAVENOUS

## 2014-09-04 MED ORDER — CEFAZOLIN SODIUM-DEXTROSE 2-3 GM-% IV SOLR
2.0000 g | Freq: Four times a day (QID) | INTRAVENOUS | Status: AC
  Administered 2014-09-04 – 2014-09-05 (×2): 2 g via INTRAVENOUS
  Filled 2014-09-04 (×2): qty 50

## 2014-09-04 MED ORDER — SODIUM CHLORIDE 0.9 % IJ SOLN
INTRAMUSCULAR | Status: AC
  Filled 2014-09-04: qty 10

## 2014-09-04 MED ORDER — HYDROCODONE-ACETAMINOPHEN 5-325 MG PO TABS
1.0000 | ORAL_TABLET | Freq: Four times a day (QID) | ORAL | Status: DC | PRN
  Administered 2014-09-04: 2 via ORAL
  Filled 2014-09-04: qty 2

## 2014-09-04 MED ORDER — DOCUSATE SODIUM 100 MG PO CAPS
100.0000 mg | ORAL_CAPSULE | Freq: Two times a day (BID) | ORAL | Status: DC
  Administered 2014-09-04 – 2014-09-06 (×4): 100 mg via ORAL
  Filled 2014-09-04 (×4): qty 1

## 2014-09-04 MED ORDER — ASPIRIN EC 81 MG PO TBEC: 81.0000 mg | DELAYED_RELEASE_TABLET | Freq: Every day | ORAL | Status: DC

## 2014-09-04 MED ORDER — SODIUM CHLORIDE 0.9 % IV SOLN
INTRAVENOUS | Status: DC
  Administered 2014-09-04: 10:00:00 via INTRAVENOUS

## 2014-09-04 MED ORDER — HYDROMORPHONE HCL 1 MG/ML IJ SOLN
0.5000 mg | Freq: Once | INTRAMUSCULAR | Status: AC
Start: 2014-09-04 — End: 2014-09-04
  Administered 2014-09-04: 0.5 mg via INTRAVENOUS
  Filled 2014-09-04: qty 1

## 2014-09-04 MED ORDER — ENOXAPARIN SODIUM 40 MG/0.4ML ~~LOC~~ SOLN: 40.0000 mg | SUBCUTANEOUS | Status: DC

## 2014-09-04 MED ORDER — MEPERIDINE HCL 25 MG/ML IJ SOLN
INTRAMUSCULAR | Status: AC
  Administered 2014-09-04: 12.5 mg via INTRAVENOUS
  Filled 2014-09-04: qty 1

## 2014-09-04 MED ORDER — PROPOFOL 10 MG/ML IV BOLUS
INTRAVENOUS | Status: DC | PRN
  Administered 2014-09-04: 20 mg via INTRAVENOUS
  Administered 2014-09-04: 30 mg via INTRAVENOUS

## 2014-09-04 MED ORDER — ALUM & MAG HYDROXIDE-SIMETH 200-200-20 MG/5ML PO SUSP: 30.0000 mL | ORAL | Status: DC | PRN

## 2014-09-04 MED ORDER — FENTANYL CITRATE (PF) 100 MCG/2ML IJ SOLN
25.0000 ug | INTRAMUSCULAR | Status: DC | PRN
  Administered 2014-09-04: 100 ug via INTRAVENOUS

## 2014-09-04 MED ORDER — METOCLOPRAMIDE HCL 5 MG PO TABS: 5.0000 mg | ORAL_TABLET | Freq: Three times a day (TID) | ORAL | Status: DC | PRN

## 2014-09-04 MED ORDER — METHOCARBAMOL 500 MG PO TABS
ORAL_TABLET | ORAL | Status: AC
  Administered 2014-09-04: 500 mg via ORAL
  Filled 2014-09-04: qty 1

## 2014-09-04 MED ORDER — SODIUM CHLORIDE 0.9 % IV BOLUS (SEPSIS): 250.0000 mL | Freq: Once | INTRAVENOUS | Status: DC

## 2014-09-04 SURGICAL SUPPLY — 58 items
BENZOIN TINCTURE PRP APPL 2/3 (GAUZE/BANDAGES/DRESSINGS) ×3 IMPLANT
BLADE SAW SGTL 18X1.27X75 (BLADE) ×2 IMPLANT
BLADE SAW SGTL 18X1.27X75MM (BLADE) ×1
BLADE SURG ROTATE 9660 (MISCELLANEOUS) IMPLANT
BNDG GAUZE ELAST 4 BULKY (GAUZE/BANDAGES/DRESSINGS) IMPLANT
CABLE CERLAGE W/CRIMP 1.8 (Cable) ×2 IMPLANT
CABLE CERLAGE W/CRIMP 1.8MM (Cable) ×1 IMPLANT
CAPT HIP HEMI 2 ×3 IMPLANT
CELLS DAT CNTRL 66122 CELL SVR (MISCELLANEOUS) ×1 IMPLANT
CLOSURE STERI-STRIP 1/2X4 (GAUZE/BANDAGES/DRESSINGS) ×1
CLOSURE WOUND 1/2 X4 (GAUZE/BANDAGES/DRESSINGS) ×2
CLSR STERI-STRIP ANTIMIC 1/2X4 (GAUZE/BANDAGES/DRESSINGS) ×2 IMPLANT
COVER SURGICAL LIGHT HANDLE (MISCELLANEOUS) ×3 IMPLANT
DRAPE C-ARM 42X72 X-RAY (DRAPES) ×3 IMPLANT
DRAPE IMP U-DRAPE 54X76 (DRAPES) ×3 IMPLANT
DRAPE STERI IOBAN 125X83 (DRAPES) ×3 IMPLANT
DRAPE U-SHAPE 47X51 STRL (DRAPES) ×9 IMPLANT
DRSG AQUACEL AG ADV 3.5X10 (GAUZE/BANDAGES/DRESSINGS) ×3 IMPLANT
DURAPREP 26ML APPLICATOR (WOUND CARE) ×3 IMPLANT
ELECT BLADE 4.0 EZ CLEAN MEGAD (MISCELLANEOUS)
ELECT BLADE 6.5 EXT (BLADE) IMPLANT
ELECT CAUTERY BLADE 6.4 (BLADE) ×3 IMPLANT
ELECT REM PT RETURN 9FT ADLT (ELECTROSURGICAL) ×3
ELECTRODE BLDE 4.0 EZ CLN MEGD (MISCELLANEOUS) IMPLANT
ELECTRODE REM PT RTRN 9FT ADLT (ELECTROSURGICAL) ×1 IMPLANT
FACESHIELD WRAPAROUND (MASK) ×6 IMPLANT
GLOVE BIOGEL PI IND STRL 8 (GLOVE) ×2 IMPLANT
GLOVE BIOGEL PI INDICATOR 8 (GLOVE) ×4
GLOVE ECLIPSE 8.0 STRL XLNG CF (GLOVE) ×3 IMPLANT
GLOVE ORTHO TXT STRL SZ7.5 (GLOVE) ×6 IMPLANT
GOWN STRL REUS W/ TWL LRG LVL3 (GOWN DISPOSABLE) ×2 IMPLANT
GOWN STRL REUS W/ TWL XL LVL3 (GOWN DISPOSABLE) ×2 IMPLANT
GOWN STRL REUS W/TWL LRG LVL3 (GOWN DISPOSABLE) ×4
GOWN STRL REUS W/TWL XL LVL3 (GOWN DISPOSABLE) ×4
HANDPIECE INTERPULSE COAX TIP (DISPOSABLE) ×2
KIT BASIN OR (CUSTOM PROCEDURE TRAY) ×3 IMPLANT
KIT ROOM TURNOVER OR (KITS) ×3 IMPLANT
MANIFOLD NEPTUNE II (INSTRUMENTS) ×3 IMPLANT
NS IRRIG 1000ML POUR BTL (IV SOLUTION) ×3 IMPLANT
PACK TOTAL JOINT (CUSTOM PROCEDURE TRAY) ×3 IMPLANT
PACK UNIVERSAL I (CUSTOM PROCEDURE TRAY) ×3 IMPLANT
PAD ARMBOARD 7.5X6 YLW CONV (MISCELLANEOUS) ×6 IMPLANT
RTRCTR WOUND ALEXIS 18CM MED (MISCELLANEOUS) ×3
SET HNDPC FAN SPRY TIP SCT (DISPOSABLE) ×1 IMPLANT
SPONGE LAP 4X18 X RAY DECT (DISPOSABLE) IMPLANT
STRIP CLOSURE SKIN 1/2X4 (GAUZE/BANDAGES/DRESSINGS) ×4 IMPLANT
SUT ETHIBOND NAB CT1 #1 30IN (SUTURE) ×3 IMPLANT
SUT MNCRL AB 4-0 PS2 18 (SUTURE) ×3 IMPLANT
SUT VIC AB 0 CT1 27 (SUTURE) ×2
SUT VIC AB 0 CT1 27XBRD ANBCTR (SUTURE) ×1 IMPLANT
SUT VIC AB 1 CT1 27 (SUTURE) ×2
SUT VIC AB 1 CT1 27XBRD ANBCTR (SUTURE) ×1 IMPLANT
SUT VIC AB 2-0 CT1 27 (SUTURE) ×2
SUT VIC AB 2-0 CT1 TAPERPNT 27 (SUTURE) ×1 IMPLANT
TOWEL OR 17X24 6PK STRL BLUE (TOWEL DISPOSABLE) ×3 IMPLANT
TOWEL OR 17X26 10 PK STRL BLUE (TOWEL DISPOSABLE) ×3 IMPLANT
TRAY FOLEY CATH 16FRSI W/METER (SET/KITS/TRAYS/PACK) IMPLANT
WATER STERILE IRR 1000ML POUR (IV SOLUTION) ×6 IMPLANT

## 2014-09-04 NOTE — Anesthesia Procedure Notes (Addendum)
Spinal Patient location during procedure: OR Staffing Anesthesiologist: Nolon Nations Performed by: anesthesiologist  Preanesthetic Checklist Completed: patient identified, site marked, surgical consent, pre-op evaluation, timeout performed, IV checked, risks and benefits discussed and monitors and equipment checked Spinal Block Patient position: sitting Prep: ChloraPrep Patient monitoring: heart rate, continuous pulse ox and blood pressure Location: L3-4 Injection technique: single-shot Needle Needle type: Quincke  Needle gauge: 22 G Needle length: 9 cm Additional Notes Expiration date of kit checked and confirmed. Patient tolerated procedure well, without complications.    Anesthesia Regional Block:  Fascia iliaca block  Pre-Anesthetic Checklist: ,, timeout performed, Correct Patient, Correct Site, Correct Laterality, Correct Procedure, Correct Position, site marked, Risks and benefits discussed,  Surgical consent,  Pre-op evaluation,  At surgeon's request and post-op pain management  Laterality: Right and Lower  Prep: chloraprep       Needles:  Injection technique: Single-shot  Needle Type: Echogenic Needle     Needle Length: 9cm 9 cm Needle Gauge: 21 and 21 G    Additional Needles:  Procedures: ultrasound guided (picture in chart) Fascia iliaca block Narrative:  Start time: 09/04/2014 7:12 PM End time: 09/04/2014 7:17 PM Injection made incrementally with aspirations every 5 mL.  Performed by: Personally  Anesthesiologist: Kartik Fernando

## 2014-09-04 NOTE — ED Provider Notes (Signed)
Patient presents to the emergency room for evaluation after a fall. Patient was doing exercises when she lost her balance and fell landing on her right hip. Patient has severe pain in her right hip and now is unable to stand Physical Exam  BP 157/57 mmHg  Pulse 83  Temp(Src) 98.9 F (37.2 C) (Oral)  Resp 16  Ht 5\' 5"  (1.651 m)  Wt 110 lb (49.896 kg)  BMI 18.31 kg/m2  SpO2 98%  Physical Exam  Constitutional: No distress.  Elderly, frail  HENT:  Head: Normocephalic and atraumatic.  Right Ear: External ear normal.  Left Ear: External ear normal.  Eyes: Conjunctivae are normal. Right eye exhibits no discharge. Left eye exhibits no discharge. No scleral icterus.  Neck: Neck supple. No tracheal deviation present.  Cardiovascular: Normal rate.   Pulmonary/Chest: Effort normal. No stridor. No respiratory distress.  Musculoskeletal: She exhibits no edema.       Right hip: She exhibits tenderness, bony tenderness and deformity.  Neurological: She is alert. Cranial nerve deficit: no gross deficits.  Skin: Skin is warm and dry. No rash noted.  Psychiatric: She has a normal mood and affect.  Nursing note and vitals reviewed.   ED Course  Procedures  EKG Interpretation  Date/Time:  Monday September 04 2014 10:09:56 EDT Ventricular Rate:  79 PR Interval:  195 QRS Duration: 118 QT Interval:  470 QTC Calculation: 539 R Axis:   -64 Text Interpretation:  Atrial-sensed ventricular-paced rhythm No further analysis attempted due to paced rhythm No significant change since last tracing Confirmed by Trudi Morgenthaler  MD-J, Shanon Becvar ([id number]) on 09/04/2014 10:19:47 AM      Dg Chest 1 View  09/04/2014   CLINICAL DATA:  Fall onto right side.  Initial encounter.  EXAM: CHEST  1 VIEW  COMPARISON:  03/29/2013  FINDINGS: Stable appearance of pacing/ICD device. Lungs show stable chronic disease and scarring without evidence of infiltrate, edema, nodule or pneumothorax. No pleural fluid is seen. The heart size and mediastinal  contours are normal. No bony fractures are seen.  IMPRESSION: No acute findings.  Stable chronic lung disease.   Electronically Signed   By: Aletta Edouard M.D.   On: 09/04/2014 10:34   Dg Hip Unilat With Pelvis 2-3 Views Right  09/04/2014   CLINICAL DATA:  Fall at senior activity center onto the right side. Right hip pain. Initial encounter.  EXAM: RIGHT HIP (WITH PELVIS) 2-3 VIEWS  COMPARISON:  Pelvis radiograph 12/20/2013. Bilateral hip radiographs 12/19/2013.  FINDINGS: Prior left hip hemiarthroplasty is again identified, incompletely evaluated. There is an acute right femoral neck fracture which demonstrates mild proximal displacement as well as rotation. The femoral head remains approximated with the acetabulum. No destructive osseous lesion is identified. Vascular calcification is noted.  IMPRESSION: Acute displaced right femoral neck fracture.   Electronically Signed   By: Logan Bores   On: 09/04/2014 10:37    MDM Xray shows a hip fracture.  Plan on ortho consult . Medical admission  Medical screening examination/treatment/procedure(s) were conducted as a shared visit with non-physician practitioner(s) and myself.  I personally evaluated the patient during the encounter.        Dorie Rank, MD 09/04/14 1059

## 2014-09-04 NOTE — ED Notes (Signed)
Dr Ninfa Linden is notified and on the way

## 2014-09-04 NOTE — ED Notes (Signed)
Pt brought in via [facility] after a fall at [facility]. Pt fell on right side and is now complaining of right hip pain a 7/10.pt denies any neck or back pain. Pt has a history of a left hip replacement. Pt has chronic bilateral lower extremity edema, and has a pacemaker. EMS gave pt 100 mcg of fentanyl.

## 2014-09-04 NOTE — ED Provider Notes (Signed)
CSN: [id number]     Arrival date & time 09/04/14  1194 History   First MD Initiated Contact with Patient 09/04/14 0945     Chief Complaint  Patient presents with  . Fall     (Consider location/radiation/quality/duration/timing/severity/associated sxs/prior Treatment) HPI    PCP: Chrisandra Netters, MD Blood pressure 157/57, pulse 83, temperature 98.9 F (37.2 C), temperature source Oral, resp. rate 16, height 5\' 5"  (1.651 m), weight 110 lb (49.896 kg), SpO2 98 %.  [name] is a 81 y.o.female with a significant PMH of nonischemic cardiomyopathy, CHF, hypertension, urge incontinence, Raynaud phenomenon, paroxysmal atrial fibrillation, history of sinus tachycardia patient with the ICD implant presents to the ER with complaints of fall and right hip pain. The patient was at the senior activity day center practicing her balance when her foot got caught on the hardwood floor and she fell over onto her right hip. She reports lightly hitting her head but that it does not hurt, she had no loss of consciousness and has no neck pain. She was given pain medication by EMS en route but she reports still having significant pain. She broke her left hip in September 2015 and it was repaired by Dr. Marlou Sa. She says that this feels exactly the same. She woke up feeling at baseline with no complaints prior to the incident.  The patient denies having any symptoms of lethargy, confusion, syncope, fever, back pain, neck pain, head pain, lacerations.    Past Medical History  Diagnosis Date  . Nonischemic cardiomyopathy     Cath without coronary disease 2006  . Systolic CHF   . Hyperlipidemia   . Hypertension   . Osteoporosis   . Urge incontinence   . Raynaud phenomenon   . PAF (paroxysmal atrial fibrillation)     Short bursts of PAF. Asymptomatic.  Sees Dr Rex Kras.  . Biventricular ICD (implantable cardioverter-defibrillator)-[employer]     Date of implant 2007 generator change 2012  . Sinus tachycardia   .  Macro dislodgment of the left ventricular lead 09/09/2012   Past Surgical History  Procedure Laterality Date  . Appendectomy    . Biventricular icd implant  06/28/2010; 03/28/2013    STJ CRTD implanted 2012 with dislodgement of LV lead 2014; 03/28/2013 CRTP placed with new LV lead by Dr Caryl Comes  . Bladder tack    . Cardiac catheterization  03/2005    EF 25%, Normal arteries, Nonischemic CM  . Abdominal hysterectomy  1970s  . Suprapubic urethrovesicular suspension  1992  . Hip arthroplasty Left 12/20/2013    Procedure: LEFT HIP HEMI ARTHROPLASTY;  Surgeon: Meredith Pel, MD;  Location: [place];  Service: Orthopedics;  Laterality: Left;  . Left and right heart catheterization with coronary angiogram N/A 12/13/2012    Procedure: LEFT AND RIGHT HEART CATHETERIZATION WITH CORONARY ANGIOGRAM;  Surgeon: Blane Ohara, MD;  Location: [facility] CATH LAB;  Service: Cardiovascular;  Laterality: N/A;  . Bi-ventricular pacemaker insertion N/A 03/28/2013    Procedure: BI-VENTRICULAR PACEMAKER INSERTION (CRT-P);  Surgeon: Deboraha Sprang, MD;  Location[facility];  Service: Cardiovascular;  Laterality: N/A;   Family History  Problem Relation Age of Onset  . Heart disease Mother   . Heart disease Father   . Cancer Father     Kidney  . Heart disease Brother   . Diabetes Son    History  Substance Use Topics  . Smoking status: Former Smoker    Quit date: 04/22/2002  . Smokeless tobacco: Never  Used  . 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   OB History    No data available     Review of Systems  10 Systems reviewed and are negative for acute change except as noted in the HPI.     Allergies  Sulfamethoxazole  Home Medications   Prior to Admission medications   Medication Sig Start Date End Date Taking? Authorizing Provider  acetaminophen (TYLENOL) 325 MG tablet Take 2 tablets (650 mg total) by mouth every 6 (six) hours. 12/22/13   Vivi Barrack, MD   alendronate (FOSAMAX) 70 MG tablet Take 1 tablet (70 mg total) by mouth every 7 (seven) days. Take with a full glass of water on an empty stomach. 06/15/14   [name], MD  aspirin 81 MG EC tablet Take 81 mg by mouth daily.     Historical Provider, MD  carvedilol (COREG) 3.125 MG tablet Take 1 tablet (3.125 mg total) by mouth 2 (two) times daily. 03/10/14   Deboraha Sprang, MD  Cranberry (SM CRANBERRY) 300 MG tablet Take 600 mg by mouth 2 (two) times daily.     Historical Provider, MD  furosemide (LASIX) 40 MG tablet Take 1 tablet (40mg  total) by mouth 2 to 3 times per week for swelling. 03/10/14   Deboraha Sprang, MD  Zinc 50 MG CAPS Take 50 mg by mouth daily.    Historical Provider, MD   BP 139/88 mmHg  Pulse 82  Temp(Src) 98.9 F (37.2 C) (Oral)  Resp 16  Ht 5\' 5"  (1.651 m)  Wt 110 lb (49.896 kg)  BMI 18.31 kg/m2  SpO2 99% Physical Exam  Constitutional: She is oriented to person, place, and time. She appears well-developed and well-nourished. No distress.  HENT:  Head: Normocephalic and atraumatic.  Eyes: Pupils are equal, round, and reactive to light.  Neck: Normal range of motion. Neck supple. No spinous process tenderness and no muscular tenderness present.  Cardiovascular: Normal rate and regular rhythm.   Pulmonary/Chest: Effort normal and breath sounds normal.  Abdominal: Soft. Bowel sounds are normal. There is no tenderness.  Musculoskeletal:       Right hip: She exhibits decreased range of motion, decreased strength, tenderness, bony tenderness and swelling. She exhibits no crepitus, no deformity and no laceration.  Swelling to bilateral lower extremities. Pedal pulses are symmetrical. Femoral pulses are both palpable.  Neurological: She is alert and oriented to person, place, and time. No cranial nerve deficit or sensory deficit.  Skin: Skin is warm and dry.  Nursing note and vitals reviewed.   ED Course  Procedures (including critical care time) Labs  Review Labs Reviewed  COMPREHENSIVE METABOLIC PANEL - Abnormal; Notable for the following:    Chloride 98 (*)    Glucose, Bld 118 (*)    All other components within normal limits  CBC WITH DIFFERENTIAL/PLATELET  PROTIME-INR  TYPE AND SCREEN    Imaging Review Dg Chest 1 View  09/04/2014   CLINICAL DATA:  Fall onto right side.  Initial encounter.  EXAM: CHEST  1 VIEW  COMPARISON:  03/29/2013  FINDINGS: Stable appearance of pacing/ICD device. Lungs show stable chronic disease and scarring without evidence of infiltrate, edema, nodule or pneumothorax. No pleural fluid is seen. The heart size and mediastinal contours are normal. No bony fractures are seen.  IMPRESSION: No acute findings.  Stable chronic lung disease.   Electronically Signed   By: Jenness Corner.D.  On: 09/04/2014 10:34   Dg Hip Unilat With Pelvis 2-3 Views Right  09/04/2014   CLINICAL DATA:  Fall at senior activity center onto the right side. Right hip pain. Initial encounter.  EXAM: RIGHT HIP (WITH PELVIS) 2-3 VIEWS  COMPARISON:  Pelvis radiograph 12/20/2013. Bilateral hip radiographs 12/19/2013.  FINDINGS: Prior left hip hemiarthroplasty is again identified, incompletely evaluated. There is an acute right femoral neck fracture which demonstrates mild proximal displacement as well as rotation. The femoral head remains approximated with the acetabulum. No destructive osseous lesion is identified. Vascular calcification is noted.  IMPRESSION: Acute displaced right femoral neck fracture.   Electronically Signed   By: Logan Bores   On: 09/04/2014 10:37     EKG Interpretation   Date/Time:  Monday September 04 2014 10:09:56 EDT Ventricular Rate:  79 PR Interval:  195 QRS Duration: 118 QT Interval:  470 QTC Calculation: 539 R Axis:   -64 Text Interpretation:  Atrial-sensed ventricular-paced rhythm No further  analysis attempted due to paced rhythm No significant change since last  tracing Confirmed by KNAPP  MD-J, JON ([id number]) on  09/04/2014 10:19:47 AM      MDM   Final diagnoses:  Fall  Femoral neck fracture, right, closed, initial encounter    Medications  0.9 %  sodium chloride infusion ( Intravenous New Bag/Given 09/04/14 1000)  morphine 4 MG/ML injection 4 mg (4 mg Intravenous Given 09/04/14 1119)  ondansetron (ZOFRAN) injection 4 mg (4 mg Intravenous Given 09/04/14 0959)  sodium chloride 0.9 % bolus 200 mL (200 mLs Intravenous New Bag/Given 09/04/14 1000)  HYDROmorphone (DILAUDID) injection 0.5 mg (0.5 mg Intravenous Given 09/04/14 1143)    Patient has sustained a right femoral neck fracture. Her first surgery was done by Dr. Marlou Sa in September of 2015, I will consult on call for [place] regarding fracture and admit the patient to [facility].  Patient to be admitted Family Practice and Dr. Ninfa Linden has been in the ED to see patient.  Filed Vitals:   09/04/14 1145  BP: 139/88  Pulse:   Temp:   Resp: [place], PA-C 09/04/14 1208

## 2014-09-04 NOTE — H&P (Signed)
[facility] Admission History and Physical Service Pager: [phone]  Patient name: [name] Medical record number: [id number] Date of birth: 1929/09/04 Age: 81 y.o. Gender: female  Primary Care Provider: Chrisandra Netters, MD Consultants: ortho Code Status: DNR  Chief Complaint: right hip pain.   Assessment and Plan: [name] is a 81 y.o. female presenting with right displaced femoral neck fracture . PMH is significant for HLD, HTN, CHF, osteoporosis, pacemaker in place, peripheral neuropathy, and afib.  Right hip displaced femoral neck fracture: relating to fall earlier today. Fall likely due to decreased gait height and possibly related to neuropathy. XR with displaced femoral neck fracture. Good pulses in right leg with intact sensation. - admit to tele, attending Dr Erin Hearing - ortho c/s, Dr Ninfa Linden has seen patient and plans to take to OR this evening - pain control with dilaudid 1 mg q2 hr prn with hold parameters for SBP <90 and DBP <60 - NPO until surgery - PT/OT after surgery  Systolic CHF/NICM: followed by Dr Caryl Comes. Last EF 35-40% with mild MR. Asymptomatic.  - continue twice weekly lasix at this time - continue coreg - monitor for signs of volume overload  HTN: BP in normal range - continue home coreg  Osteoporosis: likely contributing to fracture. - continue fosamax  FEN/GI: NPO Prophylaxis: lovenox  Disposition: admit to tele  History of Present Illness: [name] is a 81 y.o. female presenting with right hip pain. She was at the gym this morning and was walking when she tripped and fell on her right hip. She notes she did not pick up her right foot high enough and the foot got stuck on the ground causing her to fall. She landed on her right hip. She had minimal contact with her head on the floor. She had no LOC or syncope related to this. No headache. No CP, dyspnea, palpitations, abdominal pain, back pain, or fever. She  reports no other pain. She notes having her left hip replaced last year due to fracture at that time. Orthopedist at that time was Dr Marlou Sa.   Review Of Systems: Per HPI with the following additions: none Otherwise 12 point review of systems was performed and was unremarkable.  Patient Active Problem List   Diagnosis Date Noted  . Pes anserine bursitis 06/16/2014  . Atrial fibrillation 12/27/2013  . Hip fracture 12/19/2013  . Peripheral neuropathy 10/03/2013  . PVC's (premature ventricular contractions) 01/12/2013  . Macro dislodgment of the left ventricular lead 09/09/2012  . Biventricular ICD (implantable cardioverter-defibrillator)-St. Jude   . Nonischemic cardiomyopathy   . Sinus tachycardia   . Mole (skin) 06/08/2011  . Left knee pain 04/11/2011  . FATIGUE 03/11/2010  . HYPERLIPIDEMIA 05/28/2006  . HYPERTENSION, BENIGN SYSTEMIC 05/28/2006  . Chronic systolic heart failure 18/29/9371  . Osteoporosis 05/28/2006  . INCONTINENCE, URGE 05/28/2006   Past Medical History: Past Medical History  Diagnosis Date  . Nonischemic cardiomyopathy     Cath without coronary disease 2006  . Systolic CHF   . Hyperlipidemia   . Hypertension   . Osteoporosis   . Urge incontinence   . Raynaud phenomenon   . PAF (paroxysmal atrial fibrillation)     Short bursts of PAF. Asymptomatic.  Sees Dr Rex Kras.  . Biventricular ICD (implantable cardioverter-defibrillator)-St. Jude     Date of implant 2007 generator change 2012  . Sinus tachycardia   . Macro dislodgment of the left ventricular lead 09/09/2012   Past Surgical History: Past  Surgical History  Procedure Laterality Date  . Appendectomy    . Biventricular icd implant  06/28/2010; 03/28/2013    STJ CRTD implanted 2012 with dislodgement of LV lead 2014; 03/28/2013 CRTP placed with new LV lead by Dr Caryl Comes  . Bladder tack    . Cardiac catheterization  03/2005    EF 25%, Normal arteries, Nonischemic CM  . Abdominal hysterectomy  1970s  .  Suprapubic urethrovesicular suspension  1992  . Hip arthroplasty Left 12/20/2013    Procedure: LEFT HIP HEMI ARTHROPLASTY;  Surgeon: Meredith Pel, MD;  Location: [facility];  Service: Orthopedics;  Laterality: Left;  . Left and right heart catheterization with coronary angiogram N/A 12/13/2012    Procedure: LEFT AND RIGHT HEART CATHETERIZATION WITH CORONARY ANGIOGRAM;  Surgeon: Blane Ohara, MD;  Location: [facility] CATH LAB;  Service: Cardiovascular;  Laterality: N/A;  . Bi-ventricular pacemaker insertion N/A 03/28/2013    Procedure: BI-VENTRICULAR PACEMAKER INSERTION (CRT-P);  Surgeon: Deboraha Sprang, MD;  Location: [facility] CATH LAB;  Service: Cardiovascular;  Laterality: N/A;   Social History: History  Substance Use Topics  . Smoking status: Former Smoker    Quit date: 04/22/2002  . Smokeless tobacco: Never Used  . 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   Additional social history: none  Please also refer to relevant sections of EMR.  Family History: Family History  Problem Relation Age of Onset  . Heart disease Mother   . Heart disease Father   . Cancer Father     Kidney  . Heart disease Brother   . Diabetes Son    Allergies and Medications: Allergies  Allergen Reactions  . Sulfamethoxazole     REACTION: unspecified   No current facility-administered medications on file prior to encounter.   Current Outpatient Prescriptions on File Prior to Encounter  Medication Sig Dispense Refill  . acetaminophen (TYLENOL) 325 MG tablet Take 2 tablets (650 mg total) by mouth every 6 (six) hours. 240 tablet 0  . alendronate (FOSAMAX) 70 MG tablet Take 1 tablet (70 mg total) by mouth every 7 (seven) days. Take with a full glass of water on an empty stomach. 4 tablet 11  . aspirin 81 MG EC tablet Take 81 mg by mouth daily.     . carvedilol (COREG) 3.125 MG tablet Take 1 tablet (3.125 mg total) by mouth 2 (two) times daily. 180 tablet 3  . Cranberry (SM  CRANBERRY) 300 MG tablet Take 600 mg by mouth 2 (two) times daily.     . furosemide (LASIX) 40 MG tablet Take 1 tablet (40mg  total) by mouth 2 to 3 times per week for swelling. 12 tablet 6  . Zinc 50 MG CAPS Take 50 mg by mouth daily.      Objective: BP 157/57 mmHg  Pulse 83  Temp(Src) 98.9 F (37.2 C) (Oral)  Resp 16  Ht 5\' 5"  (1.651 m)  Wt 110 lb (49.896 kg)  BMI 18.31 kg/m2  SpO2 98% Exam: General: NAD, resting comfortably in bed Eyes: PERRL, sclera clear ENTM: NCAT, MMM Cardiovascular: rrr, no murmur appreciated, pacemaker noted in chest wall, trace ankle edema Respiratory: CTAB anteriorly, normal work of breathing Abdomen: s, NT, ND MSK: right LE shortened and externally rotated, unable to move RLE due to pain, there is anterior right hip pain, sensation to light touch in tact, 2+ DP pulse in RLE Skin: no rashes noted Neuro: alert, answer questions appropriately, CN 2-12 intact, 5/5  strength in bilateral biceps, triceps, grip, left quad, hamstring, plantar and dorsiflexion, right plantar flexion, unable to move at hip related to pain, sensation to light touch intact in bilateral UE and LE  Labs and Imaging: CBC BMET   Recent Labs Lab 09/04/14 1000  WBC 8.7  HGB 14.8  HCT 43.9  PLT 173    Recent Labs Lab 09/04/14 1000  NA 136  K 4.4  CL 98*  CO2 26  BUN 18  CREATININE 0.85  GLUCOSE 118*  CALCIUM 9.6     EKG: paced rhythm, rate of 79  Dg Chest 1 View  09/04/2014   CLINICAL DATA:  Fall onto right side.  Initial encounter.  EXAM: CHEST  1 VIEW  COMPARISON:  03/29/2013  FINDINGS: Stable appearance of pacing/ICD device. Lungs show stable chronic disease and scarring without evidence of infiltrate, edema, nodule or pneumothorax. No pleural fluid is seen. The heart size and mediastinal contours are normal. No bony fractures are seen.  IMPRESSION: No acute findings.  Stable chronic lung disease.   Electronically Signed   By: Aletta Edouard M.D.   On: 09/04/2014 10:34    Dg Hip Unilat With Pelvis 2-3 Views Right  09/04/2014   CLINICAL DATA:  [facility] onto the right side. Right hip pain. Initial encounter.  EXAM: RIGHT HIP (WITH PELVIS) 2-3 VIEWS  COMPARISON:  Pelvis radiograph 12/20/2013. Bilateral hip radiographs 12/19/2013.  FINDINGS: Prior left hip hemiarthroplasty is again identified, incompletely evaluated. There is an acute right femoral neck fracture which demonstrates mild proximal displacement as well as rotation. The femoral head remains approximated with the acetabulum. No destructive osseous lesion is identified. Vascular calcification is noted.  IMPRESSION: Acute displaced right femoral neck fracture.   Electronically Signed   By: Logan Bores   On: 09/04/2014 10:37     Leone Haven, MD 09/04/2014, 11:53 AM PGY-3, [facility] [facility] pager: [phone], text pages welcome

## 2014-09-04 NOTE — Transfer of Care (Signed)
Immediate Anesthesia Transfer of Care Note  Patient: [name]  Procedure(s) Performed: Procedure(s): BIPOLAR HIP ARTHROPLASTY ANTERIOR APPROACH (Right)  Patient Location: PACU  Anesthesia Type:MAC, Spinal and MAC combined with regional for post-op pain  Level of Consciousness: awake, alert , oriented and patient cooperative  Airway & Oxygen Therapy: Patient Spontanous Breathing and Patient connected to nasal cannula oxygen  Post-op Assessment: Report given to RN, Post -op Vital signs reviewed and stable and Patient moving all extremities  Post vital signs: Reviewed and stable  Last Vitals:  Filed Vitals:   09/04/14 1308  BP: 153/77  Pulse: 91  Temp: 36.9 C  Resp: 18    Complications: No apparent anesthesia complications

## 2014-09-04 NOTE — Progress Notes (Addendum)
Recived a call from central telement[facility] that pt had a 5 beat run of V tachy. MD paged x 2, awaiting page back.

## 2014-09-04 NOTE — Anesthesia Postprocedure Evaluation (Signed)
  Anesthesia Post-op Note  Patient: [name]  Procedure(s) Performed: Procedure(s): BIPOLAR HIP ARTHROPLASTY ANTERIOR APPROACH (Right)  Patient Location: PACU  Anesthesia Type:Spinal and MAC combined with regional for post-op pain  Level of Consciousness: awake and alert   Airway and Oxygen Therapy: Patient Spontanous Breathing and Patient connected to nasal cannula oxygen  Post-op Pain: none  Post-op Assessment: Post-op Vital signs reviewed and Pain level controlled  Post-op Vital Signs: Reviewed and stable  Last Vitals:  Filed Vitals:   09/04/14 2030  BP: 119/91  Pulse: 82  Temp:   Resp: 18    Complications: No apparent anesthesia complications

## 2014-09-04 NOTE — Consult Note (Signed)
Reason for Consult:  Right hip fracture Referring Physician: EDP  [name] is an 81 y.o. female.  HPI:   81 yo female who sustained an accidental mechanical fall this am.  After severe right hip pain and the inability to ambulate, was brought to the ED and found to have a displaced right hip femoral neck fracture.  She denies any other injuries and denies LOC or syncopal episode.  Past Medical History  Diagnosis Date  . Nonischemic cardiomyopathy     Cath without coronary disease 2006  . Systolic CHF   . Hyperlipidemia   . Hypertension   . Osteoporosis   . Urge incontinence   . Raynaud phenomenon   . PAF (paroxysmal atrial fibrillation)     Short bursts of PAF. Asymptomatic.  Sees Dr Rex Kras.  . Biventricular ICD (implantable cardioverter-defibrillator)-St. Jude     Date of implant 2007 generator change 2012  . Sinus tachycardia   . Macro dislodgment of the left ventricular lead 09/09/2012    Past Surgical History  Procedure Laterality Date  . Appendectomy    . Biventricular icd implant  06/28/2010; 03/28/2013    STJ CRTD implanted 2012 with dislodgement of LV lead 2014; 03/28/2013 CRTP placed with new LV lead by Dr Caryl Comes  . Bladder tack    . Cardiac catheterization  03/2005    EF 25%, Normal arteries, Nonischemic CM  . Abdominal hysterectomy  1970s  . Suprapubic urethrovesicular suspension  1992  . Hip arthroplasty Left 12/20/2013    Procedure: LEFT HIP HEMI ARTHROPLASTY;  Surgeon: Meredith Pel, MD;  Location: [facility];  Service: Orthopedics;  Laterality: Left;  . Left and right heart catheterization with coronary angiogram N/A 12/13/2012    Procedure: LEFT AND RIGHT HEART CATHETERIZATION WITH CORONARY ANGIOGRAM;  Surgeon: Blane Ohara, MD;  Location: [facility] CATH LAB;  Service: Cardiovascular;  Laterality: N/A;  . Bi-ventricular pacemaker insertion N/A 03/28/2013    Procedure: BI-VENTRICULAR PACEMAKER INSERTION (CRT-P);  Surgeon: Deboraha Sprang, MD;  Location: [facility] CATH LAB;   Service: Cardiovascular;  Laterality: N/A;    Family History  Problem Relation Age of Onset  . Heart disease Mother   . Heart disease Father   . Cancer Father     Kidney  . Heart disease Brother   . Diabetes Son     Social History:  reports that she quit smoking about 12 years ago. She has never used smokeless tobacco. She reports that she does not drink alcohol or use illicit drugs.  Allergies:  Allergies  Allergen Reactions  . Sulfamethoxazole     REACTION: unspecified    Medications: I have reviewed the patient's current medications.  Results for orders placed or performed during the hospital encounter of 09/04/14 (from the past 48 hour(s))  CBC WITH DIFFERENTIAL     Status: None   Collection Time: 09/04/14 10:00 AM  Result Value Ref Range   WBC 8.7 4.0 - 10.5 K/uL   RBC 4.79 3.87 - 5.11 MIL/uL   Hemoglobin 14.8 12.0 - 15.0 g/dL   HCT 43.9 36.0 - 46.0 %   MCV 91.6 78.0 - 100.0 fL   MCH 30.9 26.0 - 34.0 pg   MCHC 33.7 30.0 - 36.0 g/dL   RDW 14.2 11.5 - 15.5 %   Platelets 173 150 - 400 K/uL   Neutrophils Relative % 65 43 - 77 %   Neutro Abs 5.7 1.7 - 7.7 K/uL   Lymphocytes Relative 21 12 - 46 %  Lymphs Abs 1.8 0.7 - 4.0 K/uL   Monocytes Relative 11 3 - 12 %   Monocytes Absolute 0.9 0.1 - 1.0 K/uL   Eosinophils Relative 2 0 - 5 %   Eosinophils Absolute 0.2 0.0 - 0.7 K/uL   Basophils Relative 1 0 - 1 %   Basophils Absolute 0.1 0.0 - 0.1 K/uL  Protime-INR     Status: None   Collection Time: 09/04/14 10:00 AM  Result Value Ref Range   Prothrombin Time 13.6 11.6 - 15.2 seconds   INR 1.02 0.00 - 1.49  Type and screen     Status: None   Collection Time: 09/04/14 10:00 AM  Result Value Ref Range   ABO/RH(D) O POS    Antibody Screen NEG    Sample Expiration 09/07/2014   Comprehensive metabolic panel     Status: Abnormal   Collection Time: 09/04/14 10:00 AM  Result Value Ref Range   Sodium 136 135 - 145 mmol/L   Potassium 4.4 3.5 - 5.1 mmol/L   Chloride 98 (L)  101 - 111 mmol/L   CO2 26 22 - 32 mmol/L   Glucose, Bld 118 (H) 65 - 99 mg/dL   BUN 18 6 - 20 mg/dL   Creatinine, Ser 0.85 0.44 - 1.00 mg/dL   Calcium 9.6 8.9 - 10.3 mg/dL   Total Protein 6.6 6.5 - 8.1 g/dL   Albumin 3.9 3.5 - 5.0 g/dL   AST 26 15 - 41 U/L   ALT 17 14 - 54 U/L   Alkaline Phosphatase 83 38 - 126 U/L   Total Bilirubin 0.7 0.3 - 1.2 mg/dL   GFR calc non Af Amer >60 >60 mL/min   GFR calc Af Amer >60 >60 mL/min    Comment: (NOTE) The eGFR has been calculated using the CKD EPI equation. This calculation has not been validated in all clinical situations. eGFR's persistently <60 mL/min signify possible Chronic Kidney Disease.    Anion gap 12 5 - 15    Dg Chest 1 View  09/04/2014   CLINICAL DATA:  Fall onto right side.  Initial encounter.  EXAM: CHEST  1 VIEW  COMPARISON:  03/29/2013  FINDINGS: Stable appearance of pacing/ICD device. Lungs show stable chronic disease and scarring without evidence of infiltrate, edema, nodule or pneumothorax. No pleural fluid is seen. The heart size and mediastinal contours are normal. No bony fractures are seen.  IMPRESSION: No acute findings.  Stable chronic lung disease.   Electronically Signed   By: Aletta Edouard M.D.   On: 09/04/2014 10:34   Dg Hip Unilat With Pelvis 2-3 Views Right  09/04/2014   CLINICAL DATA:  [facility] onto the right side. Right hip pain. Initial encounter.  EXAM: RIGHT HIP (WITH PELVIS) 2-3 VIEWS  COMPARISON:  Pelvis radiograph 12/20/2013. Bilateral hip radiographs 12/19/2013.  FINDINGS: Prior left hip hemiarthroplasty is again identified, incompletely evaluated. There is an acute right femoral neck fracture which demonstrates mild proximal displacement as well as rotation. The femoral head remains approximated with the acetabulum. No destructive osseous lesion is identified. Vascular calcification is noted.  IMPRESSION: Acute displaced right femoral neck fracture.   Electronically Signed   By: Logan Bores   On: 09/04/2014 10:37    ROS Blood pressure 157/57, pulse 83, temperature 98.9 F (37.2 C), temperature source Oral, resp. rate 16, height 5' 5"  (1.651 m), weight 49.896 kg (110 lb), SpO2 98 %. Physical Exam  Constitutional: She is oriented to person, place,  and time. She appears well-developed and well-nourished.  HENT:  Head: Normocephalic and atraumatic.  Eyes: EOM are normal. Pupils are equal, round, and reactive to light.  Neck: Normal range of motion. Neck supple.  Cardiovascular: Normal rate and regular rhythm.   Respiratory: Effort normal and breath sounds normal.  GI: Soft. Bowel sounds are normal.  Musculoskeletal:       Right hip: She exhibits decreased range of motion, decreased strength, tenderness, bony tenderness and deformity.  Neurological: She is alert and oriented to person, place, and time.  Skin: Skin is warm and dry.    Assessment/Plan: Right hip displaced femoral neck fracture 1)  To the OR today for a right hip hemiarthroplasty.  Will proceed to surgery this evening.  She understands fully the risks and benefits involved having had a similar left hip fracture last year.  Graciously, the Medicine service has seen her and will be admitting.  Hopefully ill begin early mobility tomorrow.  BLACKMAN,CHRISTOPHER Y 09/04/2014, 11:53 AM

## 2014-09-04 NOTE — Brief Op Note (Signed)
09/04/2014  7:06 PM  PATIENT:  [name]  81 y.o. female  PRE-OPERATIVE DIAGNOSIS:  RIGHT FEMORAL NECK FRACTURE  POST-OPERATIVE DIAGNOSIS:  RIGHT FEMORAL NECK FRACTURE  PROCEDURE:  Procedure(s): BIPOLAR HIP ARTHROPLASTY ANTERIOR APPROACH (Right)  SURGEON:  Surgeon(s) and Role:    * Mcarthur Rossetti, MD - Primary  PHYSICIAN ASSISTANT: Benita Stabile, PA-C  ANESTHESIA:   spinal  EBL:  Total I/O In: 1000 [I.V.:1000] Out: -   BLOOD ADMINISTERED:none  DRAINS: none   LOCAL MEDICATIONS USED:  NONE  SPECIMEN:  No Specimen  DISPOSITION OF SPECIMEN:  N/A  COUNTS:  YES  TOURNIQUET:  * No tourniquets in log *  DICTATION: .Other Dictation: Dictation Number [id number]  PLAN OF CARE: Admit to inpatient   PATIENT DISPOSITION:  PACU - hemodynamically stable.   Delay start of Pharmacological VTE agent (>24hrs) due to surgical blood loss or risk of bleeding: no

## 2014-09-04 NOTE — Anesthesia Preprocedure Evaluation (Addendum)
Anesthesia Evaluation  Patient identified by MRN, date of birth, ID band Patient awake    Reviewed: Allergy & Precautions, H&P , NPO status , Patient's Chart, lab work & pertinent test results, reviewed documented beta blocker date and time   History of Anesthesia Complications Negative for: history of anesthetic complications  Airway Mallampati: II  TM Distance: >3 FB Neck ROM: Full    Dental  (+) Lower Dentures   Pulmonary neg shortness of breath, neg sleep apnea, neg COPDneg recent URI, former smoker,  breath sounds clear to auscultation        Cardiovascular hypertension, Pt. on medications and Pt. on home beta blockers +CHF + pacemaker + Cardiac Defibrillator Rhythm:Regular  St jude dual lead pacemaker, no icd (removed in past, RV pacer dependent   Neuro/Psych negative neurological ROS  negative psych ROS   GI/Hepatic negative GI ROS, Neg liver ROS,   Endo/Other  negative endocrine ROS  Renal/GU negative Renal ROS     Musculoskeletal  (+) Arthritis -, Osteoarthritis,  Left hip fracture   Abdominal   Peds  Hematology   Anesthesia Other Findings   Reproductive/Obstetrics                            Anesthesia Physical  Anesthesia Plan  ASA: III  Anesthesia Plan: Spinal   Post-op Pain Management:    Induction:   Airway Management Planned: Natural Airway  Additional Equipment: None  Intra-op Plan:   Post-operative Plan:   Informed Consent: I have reviewed the patients History and Physical, chart, labs and discussed the procedure including the risks, benefits and alternatives for the proposed anesthesia with the patient or authorized representative who has indicated his/her understanding and acceptance.   Dental advisory given  Plan Discussed with: CRNA  Anesthesia Plan Comments:        Anesthesia Quick Evaluation

## 2014-09-04 NOTE — ED Notes (Signed)
Pt left for XRay 

## 2014-09-05 ENCOUNTER — Encounter (HOSPITAL_COMMUNITY): Payer: Self-pay | Admitting: Orthopaedic Surgery

## 2014-09-05 LAB — CBC
HCT: 33.7 % — ABNORMAL LOW (ref 36.0–46.0)
Hemoglobin: 11.1 g/dL — ABNORMAL LOW (ref 12.0–15.0)
MCH: 30.7 pg (ref 26.0–34.0)
MCHC: 32.9 g/dL (ref 30.0–36.0)
MCV: 93.4 fL (ref 78.0–100.0)
PLATELETS: 112 10*3/uL — AB (ref 150–400)
RBC: 3.61 MIL/uL — ABNORMAL LOW (ref 3.87–5.11)
RDW: 14.3 % (ref 11.5–15.5)
WBC: 6.8 10*3/uL (ref 4.0–10.5)

## 2014-09-05 LAB — BASIC METABOLIC PANEL
ANION GAP: 9 (ref 5–15)
BUN: 9 mg/dL (ref 6–20)
CALCIUM: 8.1 mg/dL — AB (ref 8.9–10.3)
CO2: 25 mmol/L (ref 22–32)
Chloride: 98 mmol/L — ABNORMAL LOW (ref 101–111)
Creatinine, Ser: 0.72 mg/dL (ref 0.44–1.00)
GFR calc Af Amer: >60 mL/min (ref >60)
GFR calc non Af Amer: >60 mL/min (ref >60)
Glucose, Bld: 113 mg/dL — ABNORMAL HIGH (ref 65–99)
Potassium: 4 mmol/L (ref 3.5–5.1)
SODIUM: 132 mmol/L — AB (ref 135–145)

## 2014-09-05 MED ORDER — ENSURE ENLIVE PO LIQD
237.0000 mL | Freq: Two times a day (BID) | ORAL | Status: DC
  Administered 2014-09-05 – 2014-09-06 (×3): 237 mL via ORAL

## 2014-09-05 MED ORDER — OXYCODONE-ACETAMINOPHEN 5-325 MG PO TABS: 1.0000 | ORAL_TABLET | ORAL | Status: DC | PRN

## 2014-09-05 MED ORDER — DOXYCYCLINE HYCLATE 100 MG PO TABS
100.0000 mg | ORAL_TABLET | Freq: Two times a day (BID) | ORAL | Status: DC
  Administered 2014-09-05 – 2014-09-06 (×3): 100 mg via ORAL
  Filled 2014-09-05 (×3): qty 1

## 2014-09-05 MED ORDER — ASPIRIN 325 MG PO TBEC: 325.0000 mg | DELAYED_RELEASE_TABLET | Freq: Two times a day (BID) | ORAL | Status: DC

## 2014-09-05 NOTE — Progress Notes (Signed)
Subjective: 1 Day Post-Op Procedure(s) (LRB): BIPOLAR HIP ARTHROPLASTY ANTERIOR APPROACH (Right) Patient reports pain as moderate.  Did well with surgery last evening.  Vitals stable.  Objective: Vital signs in last 24 hours: Temp:  [97.7 F (36.5 C)-98.9 F (37.2 C)] 98.3 F (36.8 C) (06/07 0639) Pulse Rate:  [78-91] 88 (06/07 0639) Resp:  [14-24] 18 (06/06 2030) BP: (97-172)/(36-107) 110/38 mmHg (06/07 0640) SpO2:  [88 %-100 %] 100 % (06/07 0639) Weight:  [49.896 kg (110 lb)] 49.896 kg (110 lb) (06/06 0951)  Intake/Output from previous day: 06/06 0701 - 06/07 0700 In: 1796.3 [I.V.:1696.3; IV Piggyback:100] Out: 950 [Urine:750; Blood:200] Intake/Output this shift:     Recent Labs  09/04/14 1000  HGB 14.8    Recent Labs  09/04/14 1000  WBC 8.7  RBC 4.79  HCT 43.9  PLT 173    Recent Labs  09/04/14 1000  NA 136  K 4.4  CL 98*  CO2 26  BUN 18  CREATININE 0.85  GLUCOSE 118*  CALCIUM 9.6    Recent Labs  09/04/14 1000  INR 1.02    Sensation intact distally Intact pulses distally Dorsiflexion/Plantar flexion intact Incision: dressing C/D/I  Assessment/Plan: 1 Day Post-Op Procedure(s) (LRB): BIPOLAR HIP ARTHROPLASTY ANTERIOR APPROACH (Right) Up with therapy; WBAT right hip; anterior hip precautions  [name] Y 09/05/2014, 7:26 AM

## 2014-09-05 NOTE — Evaluation (Signed)
Physical Therapy Evaluation Patient Details Name: [name] MRN: [id number] DOB: [birth year] Today's Date: 09/05/2014   History of Present Illness  Patient is a 81 y/o female s/p R BIPOLAR HIP ARTHROPLASTY ANTERIOR APPROACH. PMH includes L THA, pacemaker, CHF, HTN, HLD and PAF.  Clinical Impression  Patient presents with pain, post surgical deficits RLE s/p above surgery and dizziness/hypotension impacting mobility assessment and safety. Tolerated sitting EOB and standing however became visibly dizziness with sway noted resulting in need to return to seated position. "I just don't feel well." See vitals below.  Sitting BP: 105/37, 102 bpm 97% Supine BP:113/41, 98 bpm  91% [place] BP 117/43, 95 bpm 92%  Rn notified of session. Pt would benefit from skilled PT and ST SNF to maximize independence and mobility prior to return home.    Follow Up Recommendations SNF;Supervision/Assistance - 24 hour    Equipment Recommendations  None recommended by PT    Recommendations for Other Services       Precautions / Restrictions Precautions Precautions: Fall Restrictions Weight Bearing Restrictions: Yes RLE Weight Bearing: Weight bearing as tolerated      Mobility  Bed Mobility Overal bed mobility: Needs Assistance Bed Mobility: Supine to Sit;Sit to Supine     Supine to sit: Min assist;HOB elevated Sit to supine: Min assist;HOB elevated   General bed mobility comments: Min A to bring RLE into/out of bed. Use of rails for support. Increased time due to pain.   Transfers Overall transfer level: Needs assistance Equipment used: Rolling walker (2 wheeled) Transfers: Sit to/from Stand Sit to Stand: Min assist         General transfer comment: Min A to rise from EOB with cues for hand placement. pt reaching to pull up on RW. + dizziness, flushed cheeks, visible sway noted resulting in need to sit. See assessment for vitals.   Ambulation/Gait                Stairs             Wheelchair Mobility    Modified Rankin (Stroke Patients Only)       Balance Overall balance assessment: Needs assistance;History of Falls Sitting-balance support: Feet supported;Bilateral upper extremity supported Sitting balance-Leahy Scale: Fair     Standing balance support: During functional activity Standing balance-Leahy Scale: Poor Standing balance comment: Relient on RW for support.                              Pertinent Vitals/Pain Pain Assessment: Faces Faces Pain Scale: Hurts even more Pain Location: right hip with movement Pain Descriptors / Indicators: Grimacing;Guarding;Sore Pain Intervention(s): Monitored during session;Limited activity within patient's tolerance;Repositioned    Home Living Family/patient expects to be discharged to:: Skilled nursing facility                      Prior Function Level of Independence: Independent         Comments: Pt very active particiating in fitness classes at senior center daily.     Hand Dominance        Extremity/Trunk Assessment   Upper Extremity Assessment: Defer to OT evaluation           Lower Extremity Assessment: RLE deficits/detail RLE Deficits / Details: Limited AROM hip, knee secondary to pain and post surgery.        Communication   Communication: No difficulties  Cognition Arousal/Alertness: Awake/alert Behavior During Therapy:  WFL for tasks assessed/performed Overall Cognitive Status: Within Functional Limits for tasks assessed                      General Comments General comments (skin integrity, edema, etc.): Pt's daughter present during PT evaluation.     Exercises Total Joint Exercises Ankle Circles/Pumps: Both;10 reps;Supine Quad Sets: Both;10 reps;Supine      Assessment/Plan    PT Assessment Patient needs continued PT services  PT Diagnosis Acute pain;Generalized weakness   PT Problem List Decreased strength;Pain;Decreased  range of motion;Decreased activity tolerance;Decreased balance;Decreased mobility  PT Treatment Interventions Balance training;Gait training;Functional mobility training;Therapeutic activities;Therapeutic exercise;Patient/family education   PT Goals (Current goals can be found in the Care Plan section) Acute Rehab PT Goals Patient Stated Goal: to go to [facility] for rehab PT Goal Formulation: With patient Time For Goal Achievement: 09/19/14 Potential to Achieve Goals: Good    Frequency Min 5X/week   Barriers to discharge Decreased caregiver support Pt lives alone    Co-evaluation               End of Session Equipment Utilized During Treatment: Gait belt Activity Tolerance: Other (comment);Treatment limited secondary to medical complications (Comment) (dizziness, hypotension) Patient left: in bed;with call bell/phone within reach;with bed alarm set;with family/visitor present Nurse Communication: Mobility status (RN notified of BP.)         Time: 6803-2122 PT Time Calculation (min) (ACUTE ONLY): 35 min   Charges:   PT Evaluation $Initial PT Evaluation Tier I: 1 Procedure PT Treatments $Therapeutic Activity: 8-22 mins   PT G Codes:        Taichi Repka A Srinika Delone 09/05/2014, 11:08 AM Wray Kearns, PT, DPT [phone]

## 2014-09-05 NOTE — Progress Notes (Signed)
Family Medicine Teaching Service Daily Progress Note Intern Pager: [phone]  Patient name: [name] Medical record number: [id number] Date of birth: 06-22-1929 Age: 81 y.o. Gender: female  Primary Care Provider: Chrisandra Netters, MD Consultants: Ortho  Code Status: Full   Pt Overview and Major Events to Date:  6/6- fall with femoral neck fracture: Right hip bipolar hemiarthroplasty through direct anterior approach.    Assessment and Plan: [name] is a 81 y.o. female presenting with right displaced femoral neck fracture secondary to a fall. PMH is significant for HLD, HTN, CHF, osteoporosis, pacemaker in place, peripheral neuropathy, and afib.  Right hip displaced femoral neck fracture: relating to fall due to decreased gait height and possibly related to neuropathy. XR with displaced femoral neck fracture. Good pulses in right leg with intact sensation. Patient is POD 1 from R hip bipolar hemiarthroplasty  - discontinue telemetry as no events overnight.  - ortho c/s, appreciate recs - patient currently has dilaudid, morphine, hydrocodone-APAP, and oxyIR PRN pain  - Has received 1 dose of dilaudid and 2 of oxyIR - Will continue with dilaudid 0.5 mg q2 hr prn with hold parameters for SBP <90 and DBP <60 and oxycodone PRN 5-15 - PT/OT for early ambulation   Systolic CHF/NICM: followed by Dr Caryl Comes. Last EF 35-40% with mild MR. Asymptomatic.  - discontinued patient's IVFs as patient appears euvolemic  - continue twice weekly lasix at this time - continue coreg - monitor for signs of volume overload  HTN: Normotensive and slightly hypotensive at times (down to 110/38) - continue home coreg  Osteoporosis: likely contributing to fracture. - continue fosamax  FEN/GI: Heart healthy diet, KVO Prophylaxis: lovenox  Disposition: Pending PT recs  Subjective:  Patient doing well with pain. Tolerating fluids. Has not tried foods. Able to get up and go to bathroom with RW and tech  with some pain.  Objective: Temp:  [97.7 F (36.5 C)-98.9 F (37.2 C)] 98.3 F (36.8 C) (06/07 0639) Pulse Rate:  [78-91] 88 (06/07 0639) Resp:  [14-24] 18 (06/06 2030) BP: (97-172)/(36-107) 110/38 mmHg (06/07 0640) SpO2:  [88 %-100 %] 100 % (06/07 0639) Weight:  [110 lb (49.896 kg)] 110 lb (49.896 kg) (06/06 0951) Physical Exam: General: Sitting up in bed alert in NAD Cardiovascular: RRR, no m/r/g noted. Trace pitting edema noted. Respiratory: No increased WOB. CTAB without wheezing, rhonchi, or crackles  Abdomen: +BS, soft, ND/NT Extremities: 2+ DP pulses b/l. Sensation intact in the LE. Able to dorsi/plantar flex. Dressing over R anterior hip with some drainage noted however well contained.   Laboratory:  Recent Labs Lab 09/04/14 1000  WBC 8.7  HGB 14.8  HCT 43.9  PLT 173    Recent Labs Lab 09/04/14 1000  NA 136  K 4.4  CL 98*  CO2 26  BUN 18  CREATININE 0.85  CALCIUM 9.6  PROT 6.6  BILITOT 0.7  ALKPHOS 83  ALT 17  AST 26  GLUCOSE 118*    Imaging/Diagnostic Tests: 09/04/2014 CLINICAL DATA: Fall onto right side. Initial encounter. EXAM: CHEST 1 VIEW COMPARISON: 03/29/2013 FINDINGS: Stable appearance of pacing/ICD device. Lungs show stable chronic disease and scarring without evidence of infiltrate, edema, nodule or pneumothorax. No pleural fluid is seen. The heart size and mediastinal contours are normal. No bony fractures are seen. IMPRESSION: No acute findings. Stable chronic lung disease. Electronically Signed By: Aletta Edouard M.D. On: 09/04/2014 10:34   Dg Hip Unilat With Pelvis 2-3 Views Right  09/04/2014 CLINICAL DATA: [facility] onto the right side. Right hip pain. Initial encounter. EXAM: RIGHT HIP (WITH PELVIS) 2-3 VIEWS COMPARISON: Pelvis radiograph 12/20/2013. Bilateral hip radiographs 12/19/2013. FINDINGS: Prior left hip hemiarthroplasty is again identified, incompletely evaluated. There is an acute right  femoral neck fracture which demonstrates mild proximal displacement as well as rotation. The femoral head remains approximated with the acetabulum. No destructive osseous lesion is identified. Vascular calcification is noted. IMPRESSION: Acute displaced right femoral neck fracture. Electronically Signed By: Logan Bores On: 09/04/2014 10:37   Pelvis Dg: Satisfactory RIGHT THR.  Archie Patten, MD 09/05/2014, 7:09 AM PGY-1, [place] Intern pager: [phone], text pages welcome

## 2014-09-05 NOTE — Progress Notes (Signed)
Initial Nutrition Assessment  DOCUMENTATION CODES:  Non-severe (moderate) malnutrition in context of chronic illness, Underweight   Pt meets criteria for MODERATE MALNUTRITION in the context of chronic illness as evidenced by moderate fat and muscle mass loss.  INTERVENTION:  Ensure Enlive (each supplement provides 350kcal and 20 grams of protein) (BID)   Encourage adequate PO intake.   NUTRITION DIAGNOSIS:  Increased nutrient needs related to  (s/p surgery) as evidenced by estimated needs.  GOAL:  Patient will meet greater than or equal to 90% of their needs  MONITOR:  PO intake, Supplement acceptance, Weight trends, Labs, I & O's  REASON FOR ASSESSMENT:   (Low BMI)    ASSESSMENT: Pt with right displaced femoral neck fracture. PMH is significant for HLD, HTN, CHF, osteoporosis, pacemaker in place, peripheral neuropathy, and afib.  PROCEDURE (6/6): BIPOLAR HIP ARTHROPLASTY ANTERIOR APPROACH (Right)  Pt reports her appetite is fine currently and PTA, however does report a lost of taste for food which she related to aging. Meal completion has been 50%. Pt reports she usually consumes 2 meals a day with Ensure 1-2 times daily. Weight has been stable. Pt reports she has always been "thin" all her life. RD to order Ensure to aid in caloric and protein needs as well as in healing. Pt was encouraged to eat her food at meals.  Nutrition-Focused physical exam completed. Findings are moderate fat depletion, moderate muscle depletion, and mild edema.   Labs: Low sodium, chloride, and calcium.  Height:  Ht Readings from Last 1 Encounters:  09/04/14 5\' 5"  (1.651 m)    Weight:  Wt Readings from Last 1 Encounters:  09/04/14 110 lb (49.896 kg)    Ideal Body Weight:  57 kg  Wt Readings from Last 10 Encounters:  09/04/14 110 lb (49.896 kg)  06/15/14 113 lb 9.6 oz (51.529 kg)  03/10/14 109 lb 12.8 oz (49.805 kg)  01/05/14 110 lb (49.896 kg)  12/29/13 113 lb 3.2 oz (51.347  kg)  12/27/13 113 lb 3.2 oz (51.347 kg)  12/26/13 113 lb 3.2 oz (51.347 kg)  12/19/13 108 lb (48.988 kg)  12/08/13 109 lb (49.442 kg)  11/21/13 109 lb 14.4 oz (49.85 kg)    BMI:  Body mass index is 18.31 kg/(m^2). Underweight  Estimated Nutritional Needs:  Kcal:  1600-1800  Protein:  70-80 grams  Fluid:  1.6 - 1.8 L/day  Skin:   (Incision on R hip, L chest)  Diet Order:  Diet Heart Room service appropriate?: Yes; Fluid consistency:: Thin  EDUCATION NEEDS:  No education needs identified at this time   Intake/Output Summary (Last 24 hours) at 09/05/14 1403 Last data filed at 09/05/14 1403  Gross per 24 hour  Intake 2036.25 ml  Output    952 ml  Net 1084.25 ml    Last BM:  6/5  Kallie Locks, MS, RD, LDN Pager # [phone] After hours/ weekend pager # [phone]

## 2014-09-05 NOTE — Op Note (Signed)
[name], Mccubbin Andersen                  ACCOUNT NO.:  [id number]  MEDICAL RECORD NO.:  [id number]  LOCATION:  OTFC                         FACILITY:  [facility]  PHYSICIAN:  Lind Guest. Ninfa Linden, M.D.DATE OF BIRTH:  02-24-1930  DATE OF PROCEDURE:  09/04/2014 DATE OF DISCHARGE:                              OPERATIVE REPORT   PREOPERATIVE DIAGNOSIS:  Displaced right hip femoral neck fracture.  POSTOPERATIVE DIAGNOSIS:  Displaced right hip femoral neck fracture.  PROCEDURE:  Right hip bipolar hemiarthroplasty through direct anterior approach.  IMPLANTS:  DePuy Corail femoral component size 13 with standard offset, size 46+ 1.5 bipolar metal hip ball, and a single cable.  SURGEON:  Lind Guest. Ninfa Linden, MD  ASSISTANT:  Erskine Emery, PA  ANESTHESIA:  Spinal.  ANTIBIOTICS:  A 2 g IV Ancef.  BLOOD LOSS:  200 mL.  COMPLICATIONS:  Crack of the calcar with Zimmer cables successfully placed.  INDICATIONS:  Ms. [name] is an 81 year old who was in an exercise this morning and sustained a mechanical fall onto her right hip.  She has had the inability to ambulate and severe pain and was brought to the [facility] Emergency Room where she was found to have a displaced right hip femoral neck fracture.  She actually had a similar accident last year sustaining a hip fracture on the left side for which she underwent an unipolar hemiarthroplasty through a posterior approach by one of  my partners.  Being on-call I saw her in the emergency room and recommended direct anterior hip for this side.  I explained in detail the risk of acute blood loss anemia, nerve and vessel injury, fracture, infection, dislocation, DVT.  She understands the goals are decreased pain, improved mobility, and overall improved quality of life.  PROCEDURE DESCRIPTION:  After informed consent was obtained, appropriate right hip was marked.  She was brought to the operating room and spinal anesthesia was obtained, and a  Foley catheter was placed.  Next, traction boots were applied to both of her feet.  She was placed supine on the Hana fracture table with a perineal post in place and both legs in inline skeletal traction devices but no traction applied.  Her right operative hip was prepped and draped with DuraPrep and sterile drapes. Time-out was called.  She was identified as the correct patient, correct right hip.  I then made an incision inferior and posterior to the anterior-superior iliac spine and carried this obliquely down the leg. I dissected down to the tensor fascia lata muscle and the tensor fascia was then divided longitudinally, so that I could proceed with a direct anterior approach to the hip.  We identified and cauterized the lateral femoral circumflex vessels and then identified her hip capsule.  We opened the hip capsule finding hematoma from her hip fracture.  We placed Cobra retractors within the hip capsule.  Then, we made our femoral neck cut proximal to the lesser trochanter using the oscillating saw and completed this with an osteotome.  We then removed the femoral head finding an incomplete fracture and measured this for a 46.  I released the transverse acetabular ligament and then we externally rotated the leg to  100 degrees, extended and adducted.  We placed a medial retractor medially and a Hohmann retractor behind the greater trochanter.  I released the lateral joint capsule, then used a box cutting osteotome to enter the femoral canal and a rongeur to lateralize.  We then began broaching from a size 8 broach using a cryo broaching system up to a size 13.  We then trialed a 46+ 1.5 bipolar femoral head, reduced this in the acetabulum and did hear a little pop. Leg lengths were stable and range of motion was stable.  We dislocated the trial components and found there was a crack in the calcar.  I was then able to place the real size 13 trial femoral component and then placed  a single Zimmer cable around the calcar region tightening this down, and this gave good stability.  I then placed the real 46 bipolar head with 1.5 length and reduced this in acetabulum and was stable.  We then irrigated the soft tissue with normal saline solution using pulsatile lavage.  I was able to get a nice tight capsular closure using #1 Ethibond suture followed by running #1 Vicryl in the tensor fascia, 0 Vicryl in the deep tissue, 2-0 Vicryl in subcutaneous tissue, 4-0 Monocryl subcuticular stitch, and Steri-Strips on the skin.  An Aquacel dressing was applied.  She was taken off the Hana table and taken to the recovery room in stable condition.  All final counts were correct.  The only complication that was the calcar fracture, which is certainly common and addressed in our risks and benefits section.  Of note, Benita Stabile, PA-C assisted in the entire case.  His assistance was crucial for facilitating all aspects of this case.     Lind Guest. Ninfa Linden, M.D.     CYB/MEDQ  D:  09/04/2014  T:  09/05/2014  Job:  [id number]

## 2014-09-05 NOTE — Discharge Summary (Signed)
[facility] Discharge Summary  Patient name: [name] Medical record number: [id number] Date of birth: Jan 14, 1930 Age: 81 y.o. Gender: female Date of Admission: 09/04/2014  Date of Discharge: 09/06/14 Admitting Physician: Lind Covert, MD  Primary Care Provider: Chrisandra Netters, MD Consultants: Ortho  Indication for Hospitalization: femoral neck fracture  Discharge Diagnoses/Problem List:  Right displaced femoral neck fracture Systolic CHF/NICM HTN Osteoporosis   Disposition: Discharge to [facility]  Discharge Condition: stable, improved  Discharge Exam:  Blood pressure 123/45, pulse 99, temperature 97.4 F (36.3 C), temperature source Oral, resp. rate 16, height 5\' 5"  (1.651 m), weight 110 lb (49.896 kg), SpO2 95 %. Gen: awake, alert, NAD, pleasant HEENT: [place]/AT, EOMI, MMM Cardio: RRR, no murmurs, +2 radial pulses Pulm: CTAB, no increased WOB, breathing on RA Abd: flat, soft, NT/ND, +BS Ext: warm, no evidence of DVT, R hip with bandage in place, no saturation appreciated, ecchymosis on lateral aspect of R thigh with +TTP but no hematoma appreciated. Skin: as above Neuro: no focal deficits, follows commands Psych: mood stable, speech normal, AOX3  Brief Hospital Course:  Ms. [name] is a 81 y/o who presented to the ED after a trip and fall with a land on her R hip. In the ED, she was noted to have an acute displaced right femoral neck fracture with good pulses and intact sensation. Dr. Ninfa Linden with orthopedics admitted the patient and performed a hemiarthroplasty (please see operative note in EMR for details). She tolerated the operation and post operative pain management was well controlled.  She as evaluated by PT/OT who recommended SNF placement.  Patient chose [facility] for continued physical rehabilitation.    Her home medications for CHF, HTN, and osteoporosis were continued during hospitalization.    Upon discharge, patient was  stable.  Her pain was well controlled.  Discharge instructions and return precautions were reviewed with patient, who voiced good understanding.  Issues for Follow Up:  -Recommend repeating BMET in 2 days.  Follow sodium level.   -Consider addition of vitamin D and calcium to medication regimen if appropriate.  Significant Procedures:  09/04/14: Right hip bipolar hemiarthroplasty through direct anterior approach  Significant Labs and Imaging:   Recent Labs Lab 09/04/14 1000 09/05/14 0646 09/06/14 0452  WBC 8.7 6.8 10.8*  HGB 14.8 11.1* 10.4*  HCT 43.9 33.7* 31.7*  PLT 173 112* 111*    Recent Labs Lab 09/04/14 1000 09/05/14 0646 09/06/14 0452  NA 136 132* 129*  K 4.4 4.0 3.8  CL 98* 98* 97*  CO2 26 25 26   GLUCOSE 118* 113* 182*  BUN 18 9 8   CREATININE 0.85 0.72 0.59  CALCIUM 9.6 8.1* 8.1*  ALKPHOS 83  --   --   AST 26  --   --   ALT 17  --   --   ALBUMIN 3.9  --   --    Pelvis Portable  09/04/2014   CLINICAL DATA:  Status post total hip replacement.  EXAM: PORTABLE PELVIS 1-2 VIEWS  COMPARISON:  12/20/2013.  FINDINGS: Status post RIGHT THR. Intertrochanteric banding augments the femoral stem. Satisfactory position and alignment. LEFT THR unchanged.  IMPRESSION: Satisfactory RIGHT THR.   Electronically Signed   By: Rolla Flatten M.D.   On: 09/04/2014 20:11   Dg Hip Operative Unilat With Pelvis Right  09/04/2014   CLINICAL DATA:  Right hip fracture  EXAM: OPERATIVE RIGHT HIP (WITH PELVIS IF PERFORMED) 2 VIEWS  TECHNIQUE: Fluoroscopic spot image(s) were submitted  for interpretation post-operatively.  FLUOROSCOPY TIME:  If the device does not provide the exposure index:  Fluoroscopy Time:  15 seconds  Number of Acquired Images:  2  COMPARISON:  None.  FINDINGS: Two intraoperative fluoroscopic spot images. Right total hip arthroplasty without failure or complication.  IMPRESSION: Right total hip arthroplasty.   Electronically Signed   By: Kathreen Devoid   On: 09/04/2014 19:10    Results/Tests Pending at Time of Discharge: none  Discharge Medications:    Medication List    TAKE these medications        acetaminophen 650 MG CR tablet  Commonly known as:  TYLENOL  Take 1,300 mg by mouth 2 (two) times daily.     alendronate 70 MG tablet  Commonly known as:  FOSAMAX  Take 1 tablet (70 mg total) by mouth every 7 (seven) days. Take with a full glass of water on an empty stomach.     aspirin 325 MG EC tablet  Take 1 tablet (325 mg total) by mouth 2 (two) times daily after a meal.     carvedilol 3.125 MG tablet  Commonly known as:  COREG  Take 1 tablet (3.125 mg total) by mouth 2 (two) times daily.     docusate sodium 100 MG capsule  Commonly known as:  COLACE  Take 1 capsule (100 mg total) by mouth 2 (two) times daily.     feeding supplement (ENSURE ENLIVE) Liqd  Take 237 mLs by mouth 2 (two) times daily between meals.     furosemide 40 MG tablet  Commonly known as:  LASIX  Take 1 tablet (40mg  total) by mouth 2 to 3 times per week for swelling.     oxyCODONE-acetaminophen 5-325 MG per tablet  Commonly known as:  ROXICET  Take 1-2 tablets by mouth every 4 (four) hours as needed.     SM CRANBERRY 300 MG tablet  Generic drug:  Cranberry  Take 600 mg by mouth 2 (two) times daily.     Zinc 50 MG Caps  Take 50 mg by mouth daily.        Discharge Instructions: Please refer to Patient Instructions section of EMR for full details.  Patient was counseled important signs and symptoms that should prompt return to medical care, changes in medications, dietary instructions, activity restrictions, and follow up appointments.   Follow-Up Appointments: Follow-up Information    Follow up with Mcarthur Rossetti, MD. Schedule an appointment as soon as possible for a visit in 2 weeks.   Specialty:  Orthopedic Surgery   Contact information:   [place] [address] [phone]       Schedule an appointment as soon as possible for a  visit with Chrisandra Netters, MD.   Specialty:  Family Medicine   Why:  once discharged from [facility] information:   [place] [address] [phone]       Janora Norlander, DO 09/06/2014, 12:30 PM PGY-1, [place]

## 2014-09-05 NOTE — Clinical Social Work Note (Signed)
Clinical Social Work Assessment  Patient Details  Name: [name] MRN: [id number] Date of Birth: 18-Oct-1929  Date of referral:  09/05/14               Reason for consult:  Facility Placement, Discharge Planning                Permission sought to share information with:  Family Supports, Chartered certified accountant granted to share information::  Yes, Verbal Permission Granted  Name::     Sports coach::  [employer] [facility]  Relationship::  Daughter  Contact Information:     Housing/Transportation Living arrangements for the past 2 months:  Single Family Home Source of Information:  Adult Children Patient Interpreter Needed:  None Criminal Activity/Legal Involvement Pertinent to Current Situation/Hospitalization:  No - Comment as needed Significant Relationships:  Adult Children, Dependent Children Lives with:  Adult Children (Patient's MR son lives with patient.) Do you feel safe going back to the place where you live?  No (High fall risk.) Need for family participation in patient care:  Yes (Comment) (Patient's daughter active in patient's care.)  Care giving concerns:  No concerns expressed by patient nor patient's daughter.   Social Worker assessment / plan:  CSW received referral for SNF placement at time of discharge. CSW spoke with patient and patient's daughter regarding discharge disposition. Per patient's daughter, patient has previously completed short-term rehabilitation at [facility] and family would prefer for patient to return. CSW to continue to follow and assist with discharge planning needs.  Employment status:  Retired Nurse, adult PT Recommendations:  [place] / Referral to community resources:  [place]  Patient/Family's Response to care:  Patient and patient's daughter understanding and agreeable to CSW plan of care.  Patient/Family's Understanding of  and Emotional Response to Diagnosis, Current Treatment, and Prognosis:  Patient and patient's daughter understanding and agreeable to CSW plan of care.  Emotional Assessment Appearance:  Appears stated age Attitude/Demeanor/Rapport:  Other (Pleasant) Affect (typically observed):  Pleasant, Appropriate, Accepting Orientation:  Oriented to Self, Oriented to Place, Oriented to  Time, Oriented to Situation Alcohol / Substance use:  Not Applicable Psych involvement (Current and /or in the community):  No (Comment) (Not appropriate on this admission.)  Discharge Needs  Concerns to be addressed:  No discharge needs identified Readmission within the last 30 days:  No Current discharge risk:  None Barriers to Discharge:  No Barriers Identified   Caroline Sauger, LCSW 09/05/2014, 2:17 PM [phone]

## 2014-09-05 NOTE — Care Management Utilization Note (Signed)
Utilization review completed. Megon Kalina, RN BSN Case Manager 

## 2014-09-05 NOTE — Progress Notes (Signed)
FPTS Interim Progress Note  S: Evaluated patient post-op overnight. No concerns per RN or patient. Checked on patient, she was awake and resting in bed. Tolerating R-hip arthroplasty for R-femoral neck hip fracture well. Surgery was evening of 6/6 after 5784, no complications. Currently admits to some pain in Right hip but not worse following surgery, improved with Oxycodone. Difficulty sleeping. No other complaints.  O: BP 110/51 mmHg  Pulse 87  Temp(Src) 98.1 F (36.7 C) (Oral)  Resp 18  Ht 5\' 5"  (1.651 m)  Wt 110 lb (49.896 kg)  BMI 18.31 kg/m2  SpO2 100%  Gen - frail elderly 62 yr F, resting in bed, appears comfortable, NAD Heart - RRR Lungs - CTAB MSK - R-hip surgical site with pressure dressing on with mild amount of saturated blood, dressing in place, no significant edema. Small areas of ecchymosis posterior to op site mildly firm, mildly +TTP Skin - warm, dry  A/P: Briefly, [name] is an 45 yr F admitted on 6/6 for fall with R-femoral neck hip fracture s/p R-hip arthroplasty evening of 6/6.  # R-femoral neck hip fracture, s/p R-hip arthroplasty (6/6) Followed by [facility] (Dr. Ninfa Linden performed surgery). Doing well POD#1 - Currently vitals stable, BP 110/51, no O2 req - Pain controlled on Oxycodone PRN 5-15, received 10mg  last dose - SCDs for dvt prophylaxis per ortho - Ortho f/u in AM, planning for possible early ambulation  Olin Hauser, DO 09/05/2014, 2:50 AM PGY-2, [place] [facility] pager [phone]

## 2014-09-05 NOTE — Clinical Social Work Placement (Signed)
   CLINICAL SOCIAL WORK PLACEMENT  NOTE  Date:  09/05/2014  Patient Details  Name: [name] MRN: [id number] Date of Birth: April 19, 1929  Clinical Social Work is seeking post-discharge placement for this patient at the [place] level of care (*CSW will initial, date and re-position this form in  chart as items are completed):  Yes   Patient/family provided with [place] Work Department's list of facilities offering this level of care within the geographic area requested by the patient (or if unable, by the patient's family).  Yes   Patient/family informed of their freedom to choose among providers that offer the needed level of care, that participate in Medicare, Medicaid or managed care program needed by the patient, have an available bed and are willing to accept the patient.  Yes   Patient/family informed of [place]'s ownership interest in [facility] and [facility], as well as of the fact that they are under no obligation to receive care at these facilities.  PASRR submitted to EDS on  (n/a)     PASRR number received on  (n/a)     Existing PASRR number confirmed on 09/05/14     FL2 transmitted to all facilities in geographic area requested by pt/family on 09/05/14     FL2 transmitted to all facilities within larger geographic area on  (n/a)     Patient informed that his/her managed care company has contracts with or will negotiate with certain facilities, including the following:   (yes)     Yes   Patient/family informed of bed offers received.  Patient chooses bed at [facility]     Physician recommends and patient chooses bed at      Patient to be transferred to [facility] on  .  Patient to be transferred to facility by       Patient family notified on   of transfer.  Name of family member notified:        PHYSICIAN Please sign FL2     Additional Comment:     _______________________________________________ Caroline Sauger, LCSW 09/05/2014, 2:19 PM [phone]

## 2014-09-06 DIAGNOSIS — M6281 Muscle weakness (generalized): Secondary | ICD-10-CM | POA: Diagnosis not present

## 2014-09-06 DIAGNOSIS — I5022 Chronic systolic (congestive) heart failure: Secondary | ICD-10-CM | POA: Diagnosis not present

## 2014-09-06 DIAGNOSIS — Z96641 Presence of right artificial hip joint: Secondary | ICD-10-CM | POA: Diagnosis not present

## 2014-09-06 DIAGNOSIS — S72001S Fracture of unspecified part of neck of right femur, sequela: Secondary | ICD-10-CM | POA: Diagnosis not present

## 2014-09-06 DIAGNOSIS — K5900 Constipation, unspecified: Secondary | ICD-10-CM | POA: Diagnosis not present

## 2014-09-06 DIAGNOSIS — Z950 Presence of cardiac pacemaker: Secondary | ICD-10-CM | POA: Diagnosis not present

## 2014-09-06 DIAGNOSIS — Z471 Aftercare following joint replacement surgery: Secondary | ICD-10-CM | POA: Diagnosis not present

## 2014-09-06 DIAGNOSIS — S72001A Fracture of unspecified part of neck of right femur, initial encounter for closed fracture: Secondary | ICD-10-CM | POA: Diagnosis not present

## 2014-09-06 DIAGNOSIS — Z9181 History of falling: Secondary | ICD-10-CM | POA: Diagnosis not present

## 2014-09-06 DIAGNOSIS — E440 Moderate protein-calorie malnutrition: Secondary | ICD-10-CM | POA: Insufficient documentation

## 2014-09-06 DIAGNOSIS — M25551 Pain in right hip: Secondary | ICD-10-CM | POA: Diagnosis not present

## 2014-09-06 DIAGNOSIS — I10 Essential (primary) hypertension: Secondary | ICD-10-CM | POA: Diagnosis not present

## 2014-09-06 DIAGNOSIS — M810 Age-related osteoporosis without current pathological fracture: Secondary | ICD-10-CM | POA: Diagnosis not present

## 2014-09-06 DIAGNOSIS — R262 Difficulty in walking, not elsewhere classified: Secondary | ICD-10-CM | POA: Diagnosis not present

## 2014-09-06 DIAGNOSIS — M21251 Flexion deformity, right hip: Secondary | ICD-10-CM | POA: Diagnosis not present

## 2014-09-06 DIAGNOSIS — D62 Acute posthemorrhagic anemia: Secondary | ICD-10-CM | POA: Diagnosis not present

## 2014-09-06 DIAGNOSIS — R278 Other lack of coordination: Secondary | ICD-10-CM | POA: Diagnosis not present

## 2014-09-06 LAB — BASIC METABOLIC PANEL
ANION GAP: 6 (ref 5–15)
BUN: 8 mg/dL (ref 6–20)
CALCIUM: 8.1 mg/dL — AB (ref 8.9–10.3)
CO2: 26 mmol/L (ref 22–32)
Chloride: 97 mmol/L — ABNORMAL LOW (ref 101–111)
Creatinine, Ser: 0.59 mg/dL (ref 0.44–1.00)
GFR calc Af Amer: >60 mL/min (ref >60)
GFR calc non Af Amer: >60 mL/min (ref >60)
Glucose, Bld: 182 mg/dL — ABNORMAL HIGH (ref 65–99)
POTASSIUM: 3.8 mmol/L (ref 3.5–5.1)
Sodium: 129 mmol/L — ABNORMAL LOW (ref 135–145)

## 2014-09-06 LAB — CBC
HCT: 31.7 % — ABNORMAL LOW (ref 36.0–46.0)
Hemoglobin: 10.4 g/dL — ABNORMAL LOW (ref 12.0–15.0)
MCH: 30.3 pg (ref 26.0–34.0)
MCHC: 32.8 g/dL (ref 30.0–36.0)
MCV: 92.4 fL (ref 78.0–100.0)
Platelets: 111 10*3/uL — ABNORMAL LOW (ref 150–400)
RBC: 3.43 MIL/uL — AB (ref 3.87–5.11)
RDW: 14.4 % (ref 11.5–15.5)
WBC: 10.8 10*3/uL — AB (ref 4.0–10.5)

## 2014-09-06 MED ORDER — DOCUSATE SODIUM 100 MG PO CAPS: 100.0000 mg | ORAL_CAPSULE | Freq: Two times a day (BID) | ORAL | Status: DC

## 2014-09-06 MED ORDER — ENSURE ENLIVE PO LIQD: 237.0000 mL | Freq: Two times a day (BID) | ORAL | Status: DC

## 2014-09-06 NOTE — Clinical Social Work Placement (Signed)
   CLINICAL SOCIAL WORK PLACEMENT  NOTE  Date:  09/06/2014  Patient Details  Name: [name] MRN: [id number] Date of Birth: 1929-10-12  Clinical Social Work is seeking post-discharge placement for this patient at the [place] level of care (*CSW will initial, date and re-position this form in  chart as items are completed):  Yes   Patient/family provided with [place] Work Department's list of facilities offering this level of care within the geographic area requested by the patient (or if unable, by the patient's family).  Yes   Patient/family informed of their freedom to choose among providers that offer the needed level of care, that participate in Medicare, Medicaid or managed care program needed by the patient, have an available bed and are willing to accept the patient.  Yes   Patient/family informed of [place]'s ownership interest in [facility] and [facility], as well as of the fact that they are under no obligation to receive care at these facilities.  PASRR submitted to EDS on  (n/a)     PASRR number received on  (n/a)     Existing PASRR number confirmed on 09/05/14     FL2 transmitted to all facilities in geographic area requested by pt/family on 09/05/14     FL2 transmitted to all facilities within larger geographic area on  (n/a)     Patient informed that his/her managed care company has contracts with or will negotiate with certain facilities, including the following:   (yes)     Yes   Patient/family informed of bed offers received.  Patient chooses bed at [facility]     Physician recommends and patient chooses bed at      Patient to be transferred to [facility] on 09/06/14.  Patient to be transferred to facility by [facility]     Patient family notified on 09/06/14 of transfer.  Name of family member notified:  Patient updated at bedside.     PHYSICIAN       Additional Comment:     _______________________________________________ Caroline Sauger, LCSW 09/06/2014, 10:24 AM [phone]

## 2014-09-06 NOTE — Clinical Social Work Note (Signed)
Patient to be discharged to [facility]. Patient and RN updated regarding discharge.  [employer] Medicare: [id number]  Facility: [facility] RN report number: [phone] Transportation: EMS ([address]  Lubertha Sayres, [place] [phone]) and Surgical [phone])

## 2014-09-06 NOTE — Progress Notes (Signed)
Physical Therapy Treatment Patient Details Name: [name] MRN: [id number] DOB: [birth year] Today's Date: 09/06/2014    History of Present Illness Patient is a 81 y/o female s/p R BIPOLAR HIP ARTHROPLASTY ANTERIOR APPROACH. PMH includes L THA, pacemaker, CHF, HTN, HLD and PAF.    PT Comments    Patient progressing well with mobility. Improved ambulation distance with Min guard assist for safety. Pt continues to be high fall risk. Reviewed there ex. Continues to be appropriate for ST SNF to maximize independence and mobility so pt can return to PLOF.   Follow Up Recommendations  SNF;Supervision/Assistance - 24 hour     Equipment Recommendations  None recommended by PT    Recommendations for Other Services       Precautions / Restrictions Precautions Precautions: Fall Restrictions Weight Bearing Restrictions: Yes RLE Weight Bearing: Weight bearing as tolerated    Mobility  Bed Mobility               General bed mobility comments: Sitting in chair upon PT arrival.   Transfers Overall transfer level: Needs assistance Equipment used: Rolling walker (2 wheeled) Transfers: Sit to/from Stand Sit to Stand: Min guard         General transfer comment: Min guard for safety and cues for hand placement and technique   Ambulation/Gait Ambulation/Gait assistance: Min guard Ambulation Distance (Feet): 100 Feet Assistive device: Rolling walker (2 wheeled) Gait Pattern/deviations: Step-to pattern;Decreased stance time - right;Decreased step length - left;Trunk flexed;Antalgic   Gait velocity interpretation: Below normal speed for age/gender General Gait Details: Slow, unsteady gait. No dizziness today. Dyspnea present. Sa02 dropped to high 80s. Resolved quickly.    Stairs            Wheelchair Mobility    Modified Rankin (Stroke Patients Only)       Balance Overall balance assessment: Needs assistance Sitting-balance support: Feet supported;No upper  extremity supported Sitting balance-Leahy Scale: Fair     Standing balance support: During functional activity Standing balance-Leahy Scale: Fair                      Cognition Arousal/Alertness: Awake/alert Behavior During Therapy: WFL for tasks assessed/performed Overall Cognitive Status: Within Functional Limits for tasks assessed                      Exercises General Exercises - Lower Extremity Ankle Circles/Pumps: Both;10 reps;Supine Short Arc Quad: Right;10 reps;Seated Long Arc Quad: 10 reps;Right Hip Flexion/Marching: AAROM;Right;10 reps;Seated Heel Raises: Both;10 reps;Seated    General Comments        Pertinent Vitals/Pain Pain Assessment: Faces Faces Pain Scale: Hurts a little bit Pain Location: right hip Pain Descriptors / Indicators: Sore Pain Intervention(s): Monitored during session;Repositioned;Ice applied    Home Living Family/patient expects to be discharged to:: Skilled nursing facility                    Prior Function Level of Independence: Independent      Comments: Pt very active particiating in fitness classes at senior center daily.   PT Goals (current goals can now be found in the care plan section) Acute Rehab PT Goals Patient Stated Goal: to go to [facility] place for rehab Progress towards PT goals: Progressing toward goals    Frequency  Min 5X/week    PT Plan Current plan remains appropriate    Co-evaluation             End of  Session Equipment Utilized During Treatment: Gait belt Activity Tolerance: Patient tolerated treatment well Patient left: in chair;with call bell/phone within reach     Time: 1330-1350 PT Time Calculation (min) (ACUTE ONLY): 20 min  Charges:  $Gait Training: 8-22 mins                    G Codes:      Renessa Wellnitz A Catera Hankins 09/06/2014, 1:55 PM Wray Kearns, [place], DPT [phone]

## 2014-09-06 NOTE — Discharge Instructions (Signed)
You can put full weight as tolerated on your right hip. Leave your current dressing in place until your outpatient orthopedic follow-up. Daily stool softener as needed. You can get you hip dressing wet in the shower daily.  Your sodium level was slightly low at discharge.  Refrain from drinking large amounts of fluids until you start taking your Lasix again.  [name] will recheck your level in 2 days.

## 2014-09-06 NOTE — Progress Notes (Signed)
Subjective: 2 Days Post-Op Procedure(s) (LRB): BIPOLAR HIP ARTHROPLASTY ANTERIOR APPROACH (Right) Patient reports pain as moderate.  Very motivated.  Working well with therapy.  Asymptomatic acute blood loss anemia.  Objective: Vital signs in last 24 hours: Temp:  [97.4 F (36.3 C)-99.7 F (37.6 C)] 97.4 F (36.3 C) (06/08 0451) Pulse Rate:  [73-111] 99 (06/08 0451) Resp:  [16-20] 16 (06/08 0451) BP: (108-123)/(45-50) 123/45 mmHg (06/08 0451) SpO2:  [92 %-95 %] 95 % (06/08 0451)  Intake/Output from previous day: 06/07 0701 - 06/08 0700 In: 826.6 [P.O.:360; I.V.:466.6] Out: 3 [Urine:3] Intake/Output this shift: Total I/O In: 113.7 [I.V.:113.7] Out: -    Recent Labs  09/04/14 1000 09/05/14 0646 09/06/14 0452  HGB 14.8 11.1* 10.4*    Recent Labs  09/05/14 0646 09/06/14 0452  WBC 6.8 10.8*  RBC 3.61* 3.43*  HCT 33.7* 31.7*  PLT 112* 111*    Recent Labs  09/05/14 0646 09/06/14 0452  NA 132* 129*  K 4.0 3.8  CL 98* 97*  CO2 25 26  BUN 9 8  CREATININE 0.72 0.59  GLUCOSE 113* 182*  CALCIUM 8.1* 8.1*    Recent Labs  09/04/14 1000  INR 1.02    Sensation intact distally Intact pulses distally Dorsiflexion/Plantar flexion intact Incision: dressing C/D/I  Assessment/Plan: 2 Days Post-Op Procedure(s) (LRB): BIPOLAR HIP ARTHROPLASTY ANTERIOR APPROACH (Right) Up with therapy  Aidon Klemens Y 09/06/2014, 6:59 AM

## 2014-09-06 NOTE — Progress Notes (Signed)
Occupational Therapy Evaluation Patient Details Name: [name] MRN: [id number] DOB: [birth year] Today's Date: 09/06/2014    History of Present Illness Patient is a 81 y/o female s/p R BIPOLAR HIP ARTHROPLASTY ANTERIOR APPROACH. PMH includes L THA, pacemaker, CHF, HTN, HLD and PAF.   Clinical Impression   Patient presenting with decreased independence in the areas of ADLs, IADLs, functional mobility secondary to fall and s/p R hip arthroplasty. Patient independent and very active PTA. Patient currently requires min guard for functional mobility using RW and up to mod assist for LB ADLs. Patient will benefit from acute OT to increase overall independence in the areas of ADLs, functional mobility, education on energy conservation, and overall safety in order to safely discharge to venue listed below.     Follow Up Recommendations  SNF;Supervision/Assistance - 24 hour    Equipment Recommendations  Other (comment) (TBD next venue of care)    Recommendations for Other Services  None at this time   Precautions / Restrictions Precautions Precautions: Fall Restrictions Weight Bearing Restrictions: Yes RLE Weight Bearing: Weight bearing as tolerated      Mobility Bed Mobility General bed mobility comments: Pt found seated in recliner upon OT entering/exiting room, please see PT note for more information  Transfers Overall transfer level: Needs assistance Equipment used: Rolling walker (2 wheeled) Transfers: Sit to/from Stand Sit to Stand: Min guard General transfer comment: Min guard for safety and cues for hand placement and technique     Balance Overall balance assessment: Needs assistance Sitting-balance support: No upper extremity supported;Feet supported Sitting balance-Leahy Scale: Fair     Standing balance support: Bilateral upper extremity supported;During functional activity Standing balance-Leahy Scale: Fair    ADL Overall ADL's : Needs  assistance/impaired Eating/Feeding: Set up;Sitting   Grooming: Set up;Sitting   Upper Body Bathing: Min guard;Sitting   Lower Body Bathing: Moderate assistance;Cueing for safety;Sit to/from stand   Upper Body Dressing : Min guard;Sitting   Lower Body Dressing: Moderate assistance;Cueing for safety;Sit to/from stand   Toilet Transfer: Min guard;RW;BSC General ADL Comments: Pt unable to reach RLE for LB ADLs, pt states she has a hip kit at home to help increase independence with LB ADLs. Pt's daughter present and supportive. Pt requires min guard for mobility and up to mod for LB ADLs without use of AE. Pt will benefit from ST SNF prior to d/c> home with family.     Pertinent Vitals/Pain Pain Assessment: Faces Faces Pain Scale: Hurts little more ("mild") Pain Location: RLE with transfers and movement Pain Descriptors / Indicators: Grimacing;Guarding;Sore Pain Intervention(s): Limited activity within patient's tolerance;Monitored during session;Ice applied     Hand Dominance Right   Extremity/Trunk Assessment Upper Extremity Assessment Upper Extremity Assessment: Overall WFL for tasks assessed   Lower Extremity Assessment Lower Extremity Assessment: Defer to PT evaluation   Cervical / Trunk Assessment Cervical / Trunk Assessment: Normal   Communication Communication Communication: No difficulties   Cognition Arousal/Alertness: Awake/alert Behavior During Therapy: WFL for tasks assessed/performed Overall Cognitive Status: Within Functional Limits for tasks assessed             Home Living Family/patient expects to be discharged to:: Skilled nursing facility   Prior Functioning/Environment Level of Independence: Independent        Comments: Pt very active particiating in fitness classes at senior center daily.    OT Diagnosis: Generalized weakness;Acute pain   OT Problem List: Decreased strength;Decreased range of motion;Decreased activity tolerance;Impaired  balance (sitting and/or standing);Decreased safety awareness;Decreased knowledge  of use of DME or AE;Pain   OT Treatment/Interventions: Self-care/ADL training;Therapeutic exercise;Energy conservation;DME and/or AE instruction;Therapeutic activities;Patient/family education;Balance training    OT Goals(Current goals can be found in the care plan section) Acute Rehab OT Goals Patient Stated Goal: to go to [facility] place for rehab OT Goal Formulation: With patient/family Time For Goal Achievement: 09/13/14 Potential to Achieve Goals: Good ADL Goals Pt Will Perform Grooming: with modified independence;standing Pt Will Perform Lower Body Bathing: sit to/from stand;with adaptive equipment;with supervision Pt Will Perform Lower Body Dressing: with supervision;with adaptive equipment;sit to/from stand Pt Will Transfer to Toilet: with modified independence;ambulating;bedside commode Pt Will Perform Toileting - Clothing Manipulation and hygiene: with modified independence;sit to/from stand Additional ADL Goal #1: Pt will be educated on energy conservation techniques to help increase independence and safety with ADLs and functional mobility  OT Frequency: Min 2X/week   Barriers to D/C: None known at this time   End of Session Equipment Utilized During Treatment: Rolling walker  Activity Tolerance: Patient tolerated treatment well Patient left: in chair;with call bell/phone within reach;with family/visitor present   Time: 2035-5974 OT Time Calculation (min): 22 min Charges:  OT General Charges $OT Visit: 1 Procedure OT Evaluation $Initial OT Evaluation Tier I: 1 Procedure  Ranay Ketter , MS, OTR/L, CLT Pager: [phone]  09/06/2014, 11:57 AM

## 2014-09-07 ENCOUNTER — Non-Acute Institutional Stay (SKILLED_NURSING_FACILITY): Payer: Commercial Managed Care - HMO | Admitting: Adult Health

## 2014-09-07 ENCOUNTER — Telehealth: Payer: Self-pay | Admitting: Family Medicine

## 2014-09-07 ENCOUNTER — Encounter: Payer: Self-pay | Admitting: Adult Health

## 2014-09-07 DIAGNOSIS — I5022 Chronic systolic (congestive) heart failure: Secondary | ICD-10-CM | POA: Diagnosis not present

## 2014-09-07 DIAGNOSIS — I10 Essential (primary) hypertension: Secondary | ICD-10-CM

## 2014-09-07 DIAGNOSIS — M810 Age-related osteoporosis without current pathological fracture: Secondary | ICD-10-CM | POA: Diagnosis not present

## 2014-09-07 DIAGNOSIS — K5900 Constipation, unspecified: Secondary | ICD-10-CM

## 2014-09-07 DIAGNOSIS — S72001S Fracture of unspecified part of neck of right femur, sequela: Secondary | ICD-10-CM | POA: Diagnosis not present

## 2014-09-07 NOTE — Progress Notes (Signed)
Patient ID: [name], female   DOB: [birth year], 80 y.o.   MRN: [id number]   09/07/2014  Facility:  [facility] Home Location:  [place] Room Number: 1206-P LEVEL OF CARE:  SNF (31)   Chief Complaint  Patient presents with  . Hospitalization Follow-up    Right femoral neck fracture S/P right hemiarthroplasty, systolic CHF, hypertension and osteoporosis    HISTORY OF PRESENT ILLNESS:  This is an 81 year old female who has been admitted to [facility] on 09/05/12 from [facility]. She fell and sustained a displaced right femoral neck fracture for which she had right hemiarthroplasty. She has PMH of A. fib, hyperlipidemia, hypertension and osteoporosis.  She has been admitted for a short-term rehabilitation.  PAST MEDICAL HISTORY:  Past Medical History  Diagnosis Date  . Nonischemic cardiomyopathy     Cath without coronary disease 2006  . Systolic CHF   . Hyperlipidemia   . Hypertension   . Osteoporosis   . Urge incontinence   . Raynaud phenomenon   . PAF (paroxysmal atrial fibrillation)     Short bursts of PAF. Asymptomatic.  Sees Dr Rex Kras.  . Biventricular ICD (implantable cardioverter-defibrillator)-[employer]     Date of implant 2007 generator change 2012  . Sinus tachycardia   . Macro dislodgment of the left ventricular lead 09/09/2012    CURRENT MEDICATIONS: Reviewed per MAR/see medication list  Allergies  Allergen Reactions  . Sulfamethoxazole     REACTION: unspecified     REVIEW OF SYSTEMS:  GENERAL: no change in appetite, no fatigue, no weight changes, no fever, chills or weakness RESPIRATORY: no cough, SOB, DOE, wheezing, hemoptysis CARDIAC: no chest pain, edema or palpitations GI: no abdominal pain, diarrhea, heart burn, nausea or vomiting, +constipation  PHYSICAL EXAMINATION  GENERAL: no acute distress SKIN:  Right hip surgical incision has aquacel dressing, no erythema, dry EYES: conjunctivae normal, sclerae  normal, normal eye lids NECK: supple, trachea midline, no neck masses, no thyroid tenderness, no thyromegaly LYMPHATICS: no LAN in the neck, no supraclavicular LAN RESPIRATORY: breathing is even & unlabored, BS CTAB CARDIAC: RRR, no murmur,no extra heart sounds, BLE edema 1+ GI: abdomen soft, normal BS, no masses, no tenderness, no hepatomegaly, no splenomegaly EXTREMITIES:  Able to move 4 extremities PSYCHIATRIC: the patient is alert & oriented to person, affect & behavior appropriate  LABS/RADIOLOGY: Labs reviewed: Basic Metabolic Panel:  Recent Labs  12/19/13 2126  09/04/14 1000 09/05/14 0646 09/06/14 0452  NA  --   < > 136 132* 129*  K  --   < > 4.4 4.0 3.8  CL  --   < > 98* 98* 97*  CO2  --   < > 26 25 26   GLUCOSE  --   < > 118* 113* 182*  BUN  --   < > 18 9 8   CREATININE  --   < > 0.85 0.72 0.59  CALCIUM  --   < > 9.6 8.1* 8.1*  MG 1.6  --   --   --   --   < > = values in this interval not displayed. Liver Function Tests:  Recent Labs  10/26/13 0923 12/19/13 1414 09/04/14 1000  AST 18 21 26   ALT 11 13 17   ALKPHOS 60 67 83  BILITOT 0.6 0.4 0.7  PROT 6.5 6.4 6.6  ALBUMIN 4.0 3.4* 3.9   CBC:  Recent Labs  12/19/13 1414  03/10/14 1047 09/04/14 1000 09/05/14 0646 09/06/14 0452  WBC 7.1  < > 7.7 8.7 6.8 10.8*  NEUTROABS 5.1  --  4.2 5.7  --   --   HGB 14.6  < > 13.9 14.8 11.1* 10.4*  HCT 43.1  < > 43.2 43.9 33.7* 31.7*  MCV 94.5  < > 89.5 91.6 93.4 92.4  PLT 144*  < > 189.0 173 112* 111*  < > = values in this interval not displayed.   Dg Chest 1 View  09/04/2014   CLINICAL DATA:  Fall onto right side.  Initial encounter.  EXAM: CHEST  1 VIEW  COMPARISON:  03/29/2013  FINDINGS: Stable appearance of pacing/ICD device. Lungs show stable chronic disease and scarring without evidence of infiltrate, edema, nodule or pneumothorax. No pleural fluid is seen. The heart size and mediastinal contours are normal. No bony fractures are seen.  IMPRESSION: No acute  findings.  Stable chronic lung disease.   Electronically Signed   By: Aletta Edouard M.D.   On: 09/04/2014 10:34   Pelvis Portable  09/04/2014   CLINICAL DATA:  Status post total hip replacement.  EXAM: PORTABLE PELVIS 1-2 VIEWS  COMPARISON:  12/20/2013.  FINDINGS: Status post RIGHT THR. Intertrochanteric banding augments the femoral stem. Satisfactory position and alignment. LEFT THR unchanged.  IMPRESSION: Satisfactory RIGHT THR.   Electronically Signed   By: Rolla Flatten M.D.   On: 09/04/2014 20:11   Dg Hip Operative Unilat With Pelvis Right  09/04/2014   CLINICAL DATA:  Right hip fracture  EXAM: OPERATIVE RIGHT HIP (WITH PELVIS IF PERFORMED) 2 VIEWS  TECHNIQUE: Fluoroscopic spot image(s) were submitted for interpretation post-operatively.  FLUOROSCOPY TIME:  If the device does not provide the exposure index:  Fluoroscopy Time:  15 seconds  Number of Acquired Images:  2  COMPARISON:  None.  FINDINGS: Two intraoperative fluoroscopic spot images. Right total hip arthroplasty without failure or complication.  IMPRESSION: Right total hip arthroplasty.   Electronically Signed   By: Kathreen Devoid   On: 09/04/2014 19:10   Dg Hip Unilat With Pelvis 2-3 Views Right  09/04/2014   CLINICAL DATA:  [facility]. Right hip pain. Initial encounter.  EXAM: RIGHT HIP (WITH PELVIS) 2-3 VIEWS  COMPARISON:  Pelvis radiograph 12/20/2013. Bilateral hip radiographs 12/19/2013.  FINDINGS: Prior left hip hemiarthroplasty is again identified, incompletely evaluated. There is an acute right femoral neck fracture which demonstrates mild proximal displacement as well as rotation. The femoral head remains approximated with the acetabulum. No destructive osseous lesion is identified. Vascular calcification is noted.  IMPRESSION: Acute displaced right femoral neck fracture.   Electronically Signed   By: Logan Bores   On: 09/04/2014 10:37    ASSESSMENT/PLAN:  Right femoral neck fracture S/P right  hemiarthroplasty - for rehabilitation; continue aspirin 325 mg 1 tab by mouth twice a day for DVT prophylaxis; Percocet 5/325 mg 1-2 tabs by mouth every 4 hours when necessary for pain Systolic CHF - increase Lasix to 40 mg 1 tab by mouth every Monday-Wednesday-Friday  Hypertension - continue carvedilol 3.125 mg by mouth twice a day Osteoporosis - continue Fosamax 70mg  tab by mouth every 7 days Constipation - start senna S2 tabs by mouth twice a day and MiraLAX 17 g +4-6 ounces liquid by mouth twice a day  Goals of care:  Short-term rehabilitation  Labs/test ordered:  BMP and CBC  Spent 50 minutes in patient care.   [facility], NP [employer] [phone]

## 2014-09-07 NOTE — Telephone Encounter (Signed)
[employer] auth obtained ID# [id number]. Good through 09/07/14 - 03/06/15.

## 2014-09-07 NOTE — Telephone Encounter (Signed)
[facility] called and they need a silverback for the patient to be seen for Post OP Care. She will be seeing Dr. Ninfa Linden NPI [id number]. Her first appointment will be 09/18/14. Blima Rich

## 2014-09-08 ENCOUNTER — Non-Acute Institutional Stay (SKILLED_NURSING_FACILITY): Payer: Commercial Managed Care - HMO | Admitting: Internal Medicine

## 2014-09-08 ENCOUNTER — Encounter: Payer: Self-pay | Admitting: Internal Medicine

## 2014-09-08 DIAGNOSIS — S72001S Fracture of unspecified part of neck of right femur, sequela: Secondary | ICD-10-CM | POA: Diagnosis not present

## 2014-09-08 DIAGNOSIS — D62 Acute posthemorrhagic anemia: Secondary | ICD-10-CM

## 2014-09-08 DIAGNOSIS — I5022 Chronic systolic (congestive) heart failure: Secondary | ICD-10-CM | POA: Diagnosis not present

## 2014-09-08 DIAGNOSIS — I10 Essential (primary) hypertension: Secondary | ICD-10-CM

## 2014-09-08 DIAGNOSIS — M810 Age-related osteoporosis without current pathological fracture: Secondary | ICD-10-CM

## 2014-09-08 DIAGNOSIS — K5900 Constipation, unspecified: Secondary | ICD-10-CM | POA: Diagnosis not present

## 2014-09-08 NOTE — Progress Notes (Signed)
Patient ID: [name], female   DOB: 12/12/1929, 81 y.o.   MRN: [id number]     [place]  PCP: Chrisandra Netters, MD  Code Status: DNR  Allergies  Allergen Reactions  . Sulfamethoxazole     REACTION: unspecified    Chief Complaint  Patient presents with  . New Admit To SNF    New Admission      HPI:  81 year old patient is here for short term rehabilitation post hospital admission from 09/04/14-09/06/14 post fall with right femoral neck fracture. She underwent right hip hemiarthroplasty. She is seen in her room today. She has PMH of afib, chf, HTN, HLD.her pain is under control with current regimen. She has been constipated. She has chronic constipation, had last bowel movement prior to the fall. She takes suppository at home that helps. No other concerns. No new concern from staff.  Review of Systems:  Constitutional: Negative for fever, chills, diaphoresis.  HENT: Negative for headache, congestion Eyes: Negative for eye pain, blurred vision, double vision and discharge.  Respiratory: Negative for cough, shortness of breath and wheezing.   Cardiovascular: Negative for chest pain, palpitations, leg swelling.  Gastrointestinal: Negative for heartburn, nausea, vomiting, abdominal pain Genitourinary: Negative for dysuria.  Musculoskeletal: Negative for back pain, falls in facility Skin: Negative for itching, rash.  Neurological: Negative for dizziness, tingling, focal weakness Psychiatric/Behavioral: Negative for depression   Past Medical History  Diagnosis Date  . Nonischemic cardiomyopathy     Cath without coronary disease 2006  . Systolic CHF   . Hyperlipidemia   . Hypertension   . Osteoporosis   . Urge incontinence   . Raynaud phenomenon   . PAF (paroxysmal atrial fibrillation)     Short bursts of PAF. Asymptomatic.  Sees Dr Rex Kras.  . Biventricular ICD (implantable cardioverter-defibrillator)-[employer]     Date of implant 2007 generator change  2012  . Sinus tachycardia   . Macro dislodgment of the left ventricular lead 09/09/2012   Past Surgical History  Procedure Laterality Date  . Appendectomy    . Biventricular icd implant  06/28/2010; 03/28/2013    STJ CRTD implanted 2012 with dislodgement of LV lead 2014; 03/28/2013 CRTP placed with new LV lead by Dr Caryl Comes  . Bladder tack    . Cardiac catheterization  03/2005    EF 25%, Normal arteries, Nonischemic CM  . Abdominal hysterectomy  1970s  . Suprapubic urethrovesicular suspension  1992  . Hip arthroplasty Left 12/20/2013    Procedure: LEFT HIP HEMI ARTHROPLASTY;  Surgeon: Meredith Pel, MD;  Location: [place];  Service: Orthopedics;  Laterality: Left;  . Left and right heart catheterization with coronary angiogram N/A 12/13/2012    Procedure: LEFT AND RIGHT HEART CATHETERIZATION WITH CORONARY ANGIOGRAM;  Surgeon: Blane Ohara, MD;  Location: [facility] C[facility] LAB;  Service: Cardiovascular;  Laterality: N/A;  . Bi-ventricular pacemaker insertion N/A 03/28/2013    Procedure: BI-VENTRICULAR PACEMAKER INSERTION (CRT-P);  Surgeon: Deboraha Sprang, MD;  Location: [facility] CATH LAB;  Service: Cardiovascular;  Laterality: N/A;  . Total hip arthroplasty Right 09/04/2014    Procedure: BIPOLAR HIP ARTHROPLASTY ANTERIOR APPROACH;  Surgeon: Mcarthur Rossetti, MD;  Location: [place];  Service: Orthopedics;  Laterality: Right;   Social History:   reports that she quit smoking about 12 years ago. She has never used smokeless tobacco. She reports that she does not drink alcohol or use illicit drugs.  Family History  Problem Relation Age of Onset  .  Heart disease Mother   . Heart disease Father   . Cancer Father     Kidney  . Heart disease Brother   . Diabetes Son     Medications: Patient's Medications  New Prescriptions   No medications on file  Previous Medications   ACETAMINOPHEN (TYLENOL) 650 MG CR TABLET    Take 1,300 mg by mouth 2 (two) times daily.   ASPIRIN EC 325 MG EC TABLET    Take 1  tablet (325 mg total) by mouth 2 (two) times daily after a meal.   CARVEDILOL (COREG) 3.125 MG TABLET    Take 1 tablet (3.125 mg total) by mouth 2 (two) times daily.   CRANBERRY (SM CRANBERRY) 300 MG TABLET    Take 600 mg by mouth 2 (two) times daily.    OXYCODONE-ACETAMINOPHEN (ROXICET) 5-325 MG PER TABLET    Take 1-2 tablets by mouth every 4 (four) hours as needed.   POLYETHYLENE GLYCOL (MIRALAX / GLYCOLAX) PACKET    Take 17 g by mouth 2 (two) times daily.   SENNOSIDES-DOCUSATE SODIUM (SENOKOT-S) 8.6-50 MG TABLET    Take 2 tablets by mouth 2 (two) times daily.   ZINC 50 MG CAPS    Take 50 mg by mouth daily.  Modified Medications   Modified Medication Previous Medication   FUROSEMIDE (LASIX) 40 MG TABLET furosemide (LASIX) 40 MG tablet      Take 1 tablet (40mg  total) by mouth M/W/F    Take 1 tablet (40mg  total) by mouth 2 to 3 times per week for swelling.  Discontinued Medications   ALENDRONATE (FOSAMAX) 70 MG TABLET    Take 1 tablet (70 mg total) by mouth every 7 (seven) days. Take with a full glass of water on an empty stomach.   DOCUSATE SODIUM (COLACE) 100 MG CAPSULE    Take 1 capsule (100 mg total) by mouth 2 (two) times daily.   FEEDING SUPPLEMENT, ENSURE ENLIVE, (ENSURE ENLIVE) LIQD    Take 237 mLs by mouth 2 (two) times daily between meals.     Physical Exam: Filed Vitals:   09/08/14 1135  BP: 110/58  Pulse: 94  Temp: 98 F (36.7 C)  TempSrc: Oral  Resp: 19  Height: 5\' 5"  (1.651 m)  Weight: 110 lb (49.896 kg)  SpO2: 96%    General- elderly female, in no acute distress Head- normocephalic, atraumatic Throat- moist mucus membrane Eyes- PERRLA, EOMI, no pallor, no icterus, no discharge, normal conjunctiva, normal sclera Neck- no cervical lymphadenopathy Cardiovascular- normal s1,s2, no murmurs, palpabledorsalis pedis and radial pulses, right leg 1+ edema Respiratory- bilateral clear to auscultation, no wheeze, no rhonchi, no crackles, no use of accessory muscles Abdomen-  bowel sounds present, soft, non tender, no guarding/ rigidity Musculoskeletal- able to move all 4 extremities, right leg ROM limited  Neurological- no focal deficit Skin- warm and dry, aquacel dressing to right leg, right thigh bruise noted Psychiatry- alert and oriented to person, place and time, normal mood and affect    Labs reviewed: Basic Metabolic Panel:  Recent Labs  12/19/13 2126  09/04/14 1000 09/05/14 0646 09/06/14 0452  NA  --   < > 136 132* 129*  K  --   < > 4.4 4.0 3.8  CL  --   < > 98* 98* 97*  CO2  --   < > 26 25 26   GLUCOSE  --   < > 118* 113* 182*  BUN  --   < > 18 9 8   CREATININE  --   < >  0.85 0.72 0.59  CALCIUM  --   < > 9.6 8.1* 8.1*  MG 1.6  --   --   --   --   < > = values in this interval not displayed. Liver Function Tests:  Recent Labs  10/26/13 0923 12/19/13 1414 09/04/14 1000  AST 18 21 26   ALT 11 13 17   ALKPHOS 60 67 83  BILITOT 0.6 0.4 0.7  PROT 6.5 6.4 6.6  ALBUMIN 4.0 3.4* 3.9   No results for input(s): LIPASE, AMYLASE in the last 8760 hours. No results for input(s): AMMONIA in the last 8760 hours. CBC:  Recent Labs  12/19/13 1414  03/10/14 1047 09/04/14 1000 09/05/14 0646 09/06/14 0452  WBC 7.1  < > 7.7 8.7 6.8 10.8*  NEUTROABS 5.1  --  4.2 5.7  --   --   HGB 14.6  < > 13.9 14.8 11.1* 10.4*  HCT 43.1  < > 43.2 43.9 33.7* 31.7*  MCV 94.5  < > 89.5 91.6 93.4 92.4  PLT 144*  < > 189.0 173 112* 111*  < > = values in this interval not displayed.   Radiological exam  Dg Chest 1 View  09/04/2014   CLINICAL DATA:  Fall onto right side.  Initial encounter.  EXAM: CHEST  1 VIEW  COMPARISON:  03/29/2013  FINDINGS: Stable appearance of pacing/ICD device. Lungs show stable chronic disease and scarring without evidence of infiltrate, edema, nodule or pneumothorax. No pleural fluid is seen. The heart size and mediastinal contours are normal. No bony fractures are seen.  IMPRESSION: No acute findings.  Stable chronic lung disease.    Electronically Signed   By: Aletta Edouard M.D.   On: 09/04/2014 10:34   Pelvis Portable  09/04/2014   CLINICAL DATA:  Status post total hip replacement.  EXAM: PORTABLE PELVIS 1-2 VIEWS  COMPARISON:  12/20/2013.  FINDINGS: Status post RIGHT THR. Intertrochanteric banding augments the femoral stem. Satisfactory position and alignment. LEFT THR unchanged.  IMPRESSION: Satisfactory RIGHT THR.   Electronically Signed   By: Rolla Flatten M.D.   On: 09/04/2014 20:11   Dg Hip Operative Unilat With Pelvis Right  09/04/2014   CLINICAL DATA:  Right hip fracture  EXAM: OPERATIVE RIGHT HIP (WITH PELVIS IF PERFORMED) 2 VIEWS  TECHNIQUE: Fluoroscopic spot image(s) were submitted for interpretation post-operatively.  FLUOROSCOPY TIME:  If the device does not provide the exposure index:  Fluoroscopy Time:  15 seconds  Number of Acquired Images:  2  COMPARISON:  None.  FINDINGS: Two intraoperative fluoroscopic spot images. Right total hip arthroplasty without failure or complication.  IMPRESSION: Right total hip arthroplasty.   Electronically Signed   By: Kathreen Devoid   On: 09/04/2014 19:10   Dg Hip Unilat With Pelvis 2-3 Views Right  09/04/2014   CLINICAL DATA:  [facility]. Right hip pain. Initial encounter.  EXAM: RIGHT HIP (WITH PELVIS) 2-3 VIEWS  COMPARISON:  Pelvis radiograph 12/20/2013. Bilateral hip radiographs 12/19/2013.  FINDINGS: Prior left hip hemiarthroplasty is again identified, incompletely evaluated. There is an acute right femoral neck fracture which demonstrates mild proximal displacement as well as rotation. The femoral head remains approximated with the acetabulum. No destructive osseous lesion is identified. Vascular calcification is noted.  IMPRESSION: Acute displaced right femoral neck fracture.   Electronically Signed   By: Logan Bores   On: 09/04/2014 10:37    Assessment/Plan  Right femoral neck fracture  S/P right hemiarthroplasty. Continue aspirin  325  mg bid for DVT prophylaxis. Continue Percocet 5/325 mg 1-2 tabs q4h prn pain. Will have her work with physical therapy and occupational therapy team to help with gait training and muscle strengthening exercises.fall precautions. Skin care. Encourage to be out of bed.   Constipation Continue miralax and senna s. Add dulcolax suppository x 1 now and then daily prn  Blood loss anemia Post op, monitor h&h  HTN Stable, continue coreg 3.125 mg bid and lasix 40 mg three days a week and monitor bp  Systolic CHF euvolemic on exam. Continue lasix 40 mg three days a week and coreg 3.125 mg bid. Monitor weight and bmp  Osteoporosis continue Fosamax 70mg  once a week, fall precautions   Goals of care: short term rehabilitation   Labs/tests ordered: cbc, bmp  Family/ staff Communication: reviewed care plan with patient and nursing supervisor    Blanchie Serve, MD  [facility] [phone] (Monday-Friday 8 am - 5 pm) [phone] (afterhours)

## 2014-09-15 ENCOUNTER — Encounter: Payer: Self-pay | Admitting: Adult Health

## 2014-09-15 ENCOUNTER — Non-Acute Institutional Stay: Payer: Commercial Managed Care - HMO | Admitting: Adult Health

## 2014-09-15 DIAGNOSIS — K5900 Constipation, unspecified: Secondary | ICD-10-CM

## 2014-09-15 DIAGNOSIS — I5022 Chronic systolic (congestive) heart failure: Secondary | ICD-10-CM

## 2014-09-15 DIAGNOSIS — M810 Age-related osteoporosis without current pathological fracture: Secondary | ICD-10-CM

## 2014-09-15 DIAGNOSIS — I10 Essential (primary) hypertension: Secondary | ICD-10-CM | POA: Diagnosis not present

## 2014-09-15 DIAGNOSIS — S72001S Fracture of unspecified part of neck of right femur, sequela: Secondary | ICD-10-CM

## 2014-09-15 NOTE — Progress Notes (Signed)
Patient ID: GERALD HONEA, female   DOB: 01/22/1930, 81 y.o.   MRN: [id number]   09/15/2014  Facility:  [facility]ursing Home Location:  [place] Room Number: 1206-P LEVEL OF CARE:  SNF (31)   Chief Complaint  Patient presents with  . Discharge Note    Right femoral neck fracture S/P right hemiarthroplasty, systolic CHF, hypertension, constipation and osteoporosis    HISTORY OF PRESENT ILLNESS:  This is an 81 year old female who is for discharge home with Home health PT for balance, gait, strengthening and activity tolerance and OT for self-care and home management. She has been admitted to [facility] on 09/05/12 from [facility] She fell and sustained a displaced right femoral neck fracture for which she had right hemiarthroplasty. She has PMH of A. fib, hyperlipidemia, hypertension and osteoporosis.  Patient was admitted to this facility for short-term rehabilitation after the patient's recent hospitalization.  Patient has completed SNF rehabilitation and therapy has cleared the patient for discharge.  PAST MEDICAL HISTORY:  Past Medical History  Diagnosis Date  . Nonischemic cardiomyopathy     Cath without coronary disease 2006  . Systolic CHF   . Hyperlipidemia   . Hypertension   . Osteoporosis   . Urge incontinence   . Raynaud phenomenon   . PAF (paroxysmal atrial fibrillation)     Short bursts of PAF. Asymptomatic.  Sees Dr Rex Kras.  . Biventricular ICD (implantable cardioverter-defibrillator)-[employer]     Date of implant 2007 generator change 2012  . Sinus tachycardia   . Macro dislodgment of the left ventricular lead 09/09/2012    CURRENT MEDICATIONS: Reviewed per MAR/see medication list  Allergies  Allergen Reactions  . Sulfamethoxazole     REACTION: unspecified     REVIEW OF SYSTEMS:  GENERAL: no change in appetite, no fatigue, no weight changes, no fever, chills or weakness RESPIRATORY: no cough, SOB, DOE, wheezing,  hemoptysis CARDIAC: no chest pain, edema or palpitations GI: no abdominal pain, diarrhea, heart burn, nausea or vomiting  PHYSICAL EXAMINATION  GENERAL: no acute distress SKIN:  Right hip surgical incision has aquacel dressing, no erythema, dry NECK: supple, trachea midline, no neck masses, no thyroid tenderness, no thyromegaly LYMPHATICS: no LAN in the neck, no supraclavicular LAN RESPIRATORY: breathing is even & unlabored, BS CTAB CARDIAC: RRR, no murmur,no extra heart sounds, BLE edema 1+ GI: abdomen soft, normal BS, no masses, no tenderness, no hepatomegaly, no splenomegaly EXTREMITIES:  Able to move 4 extremities PSYCHIATRIC: the patient is alert & oriented to person, affect & behavior appropriate  LABS/RADIOLOGY: Labs reviewed: 09/08/14  WBC 6.9 hemoglobin 10.4 hematocrit 30.8 MCV 90.3 platelet 163 sodium 135 potassium 4.5 glucose 112 BUN 12 creatinine 0.51 calcium 8.2 Basic Metabolic Panel:  Recent Labs  12/19/13 2126  09/04/14 1000 09/05/14 0646 09/06/14 0452  NA  --   < > 136 132* 129*  K  --   < > 4.4 4.0 3.8  CL  --   < > 98* 98* 97*  CO2  --   < > 26 25 26   GLUCOSE  --   < > 118* 113* 182*  BUN  --   < > 18 9 8   CREATININE  --   < > 0.85 0.72 0.59  CALCIUM  --   < > 9.6 8.1* 8.1*  MG 1.6  --   --   --   --   < > = values in this interval not displayed. Liver Function  Tests:  Recent Labs  10/26/13 0923 12/19/13 1414 09/04/14 1000  AST 18 21 26   ALT 11 13 17   ALKPHOS 60 67 83  BILITOT 0.6 0.4 0.7  PROT 6.5 6.4 6.6  ALBUMIN 4.0 3.4* 3.9   CBC:  Recent Labs  12/19/13 1414  03/10/14 1047 09/04/14 1000 09/05/14 0646 09/06/14 0452  WBC 7.1  < > 7.7 8.7 6.8 10.8*  NEUTROABS 5.1  --  4.2 5.7  --   --   HGB 14.6  < > 13.9 14.8 11.1* 10.4*  HCT 43.1  < > 43.2 43.9 33.7* 31.7*  MCV 94.5  < > 89.5 91.6 93.4 92.4  PLT 144*  < > 189.0 173 112* 111*  < > = values in this interval not displayed.   Dg Chest 1 View  09/04/2014   CLINICAL DATA:  Fall onto  right side.  Initial encounter.  EXAM: CHEST  1 VIEW  COMPARISON:  03/29/2013  FINDINGS: Stable appearance of pacing/ICD device. Lungs show stable chronic disease and scarring without evidence of infiltrate, edema, nodule or pneumothorax. No pleural fluid is seen. The heart size and mediastinal contours are normal. No bony fractures are seen.  IMPRESSION: No acute findings.  Stable chronic lung disease.   Electronically Signed   By: Aletta Edouard M.D.   On: 09/04/2014 10:34   Pelvis Portable  09/04/2014   CLINICAL DATA:  Status post total hip replacement.  EXAM: PORTABLE PELVIS 1-2 VIEWS  COMPARISON:  12/20/2013.  FINDINGS: Status post RIGHT THR. Intertrochanteric banding augments the femoral stem. Satisfactory position and alignment. LEFT THR unchanged.  IMPRESSION: Satisfactory RIGHT THR.   Electronically Signed   By: Rolla Flatten M.D.   On: 09/04/2014 20:11   Dg Hip Operative Unilat With Pelvis Right  09/04/2014   CLINICAL DATA:  Right hip fracture  EXAM: OPERATIVE RIGHT HIP (WITH PELVIS IF PERFORMED) 2 VIEWS  TECHNIQUE: Fluoroscopic spot image(s) were submitted for interpretation post-operatively.  FLUOROSCOPY TIME:  If the device does not provide the exposure index:  Fluoroscopy Time:  15 seconds  Number of Acquired Images:  2  COMPARISON:  None.  FINDINGS: Two intraoperative fluoroscopic spot images. Right total hip arthroplasty without failure or complication.  IMPRESSION: Right total hip arthroplasty.   Electronically Signed   By: Kathreen Devoid   On: 09/04/2014 19:10   Dg Hip Unilat With Pelvis 2-3 Views Right  09/04/2014   CLINICAL DATA:  [facility] onto the right side. Right hip pain. Initial encounter.  EXAM: RIGHT HIP (WITH PELVIS) 2-3 VIEWS  COMPARISON:  Pelvis radiograph 12/20/2013. Bilateral hip radiographs 12/19/2013.  FINDINGS: Prior left hip hemiarthroplasty is again identified, incompletely evaluated. There is an acute right femoral neck fracture which demonstrates mild  proximal displacement as well as rotation. The femoral head remains approximated with the acetabulum. No destructive osseous lesion is identified. Vascular calcification is noted.  IMPRESSION: Acute displaced right femoral neck fracture.   Electronically Signed   By: Logan Bores   On: 09/04/2014 10:37    ASSESSMENT/PLAN:  Right femoral neck fracture S/P right hemiarthroplasty - for Home health PT and OT; continue aspirin 325 mg 1 tab by mouth twice a day for DVT prophylaxis; Percocet 5/325 mg 1-2 tabs by mouth every 4 hours when necessary for pain Systolic CHF - continue Lasix to 40 mg 1 tab by mouth every Monday-Wednesday-Friday  Hypertension - continue carvedilol 3.125 mg by mouth twice a day Osteoporosis - continue Fosamax 70mg  tab  by mouth every 7 days Constipation - continue senna S2 tabs by mouth twice a day, Dulcolax suppository 1 per rectum Q D PRN and MiraLAX 17 g +4-6 ounces liquid by mouth twice a day    I have filled out patient's discharge paperwork and written prescriptions.  Patient will receive home health PT and OT.  Total discharge time: Less than 30 minutes  Discharge time involved coordination of the discharge process with Education officer, museum, nursing staff and therapy department. Medical justification for home health services verified.   [facility], NP [employer] [phone]

## 2014-09-16 DIAGNOSIS — M810 Age-related osteoporosis without current pathological fracture: Secondary | ICD-10-CM | POA: Diagnosis not present

## 2014-09-16 DIAGNOSIS — I429 Cardiomyopathy, unspecified: Secondary | ICD-10-CM | POA: Diagnosis not present

## 2014-09-16 DIAGNOSIS — I10 Essential (primary) hypertension: Secondary | ICD-10-CM | POA: Diagnosis not present

## 2014-09-16 DIAGNOSIS — Z95810 Presence of automatic (implantable) cardiac defibrillator: Secondary | ICD-10-CM | POA: Diagnosis not present

## 2014-09-16 DIAGNOSIS — Z9181 History of falling: Secondary | ICD-10-CM | POA: Diagnosis not present

## 2014-09-16 DIAGNOSIS — Z96641 Presence of right artificial hip joint: Secondary | ICD-10-CM | POA: Diagnosis not present

## 2014-09-16 DIAGNOSIS — I5020 Unspecified systolic (congestive) heart failure: Secondary | ICD-10-CM | POA: Diagnosis not present

## 2014-09-16 DIAGNOSIS — G629 Polyneuropathy, unspecified: Secondary | ICD-10-CM | POA: Diagnosis not present

## 2014-09-16 DIAGNOSIS — Z471 Aftercare following joint replacement surgery: Secondary | ICD-10-CM | POA: Diagnosis not present

## 2014-09-18 DIAGNOSIS — S72041A Displaced fracture of base of neck of right femur, initial encounter for closed fracture: Secondary | ICD-10-CM | POA: Diagnosis not present

## 2014-09-19 ENCOUNTER — Telehealth: Payer: Self-pay | Admitting: Family Medicine

## 2014-09-19 ENCOUNTER — Ambulatory Visit (INDEPENDENT_AMBULATORY_CARE_PROVIDER_SITE_OTHER): Payer: Commercial Managed Care - HMO | Admitting: Internal Medicine

## 2014-09-19 ENCOUNTER — Encounter: Payer: Self-pay | Admitting: Internal Medicine

## 2014-09-19 VITALS — BP 138/64 | HR 100 | Ht 65.0 in | Wt 114.2 lb

## 2014-09-19 DIAGNOSIS — I429 Cardiomyopathy, unspecified: Secondary | ICD-10-CM

## 2014-09-19 DIAGNOSIS — Z45018 Encounter for adjustment and management of other part of cardiac pacemaker: Secondary | ICD-10-CM | POA: Diagnosis not present

## 2014-09-19 DIAGNOSIS — I5022 Chronic systolic (congestive) heart failure: Secondary | ICD-10-CM | POA: Diagnosis not present

## 2014-09-19 DIAGNOSIS — I428 Other cardiomyopathies: Secondary | ICD-10-CM

## 2014-09-19 LAB — CUP PACEART INCLINIC DEVICE CHECK
Battery Remaining Longevity: 75.6 mo
Brady Statistic RA Percent Paced: 0 %
Brady Statistic RV Percent Paced: 98 %
Date Time Interrogation Session: 20160621152446
Lead Channel Impedance Value: 350 Ohm
Lead Channel Impedance Value: 425 Ohm
Lead Channel Impedance Value: 512.5 Ohm
Lead Channel Pacing Threshold Amplitude: 0.75 V
Lead Channel Pacing Threshold Amplitude: 0.75 V
Lead Channel Pacing Threshold Amplitude: 1.375 V
Lead Channel Pacing Threshold Pulse Width: 0.4 ms
Lead Channel Pacing Threshold Pulse Width: 0.4 ms
Lead Channel Pacing Threshold Pulse Width: 0.4 ms
Lead Channel Pacing Threshold Pulse Width: 0.4 ms
Lead Channel Pacing Threshold Pulse Width: 0.7 ms
Lead Channel Sensing Intrinsic Amplitude: 9.2 mV
Lead Channel Setting Pacing Amplitude: 2 V
Lead Channel Setting Pacing Amplitude: 2.5 V
Lead Channel Setting Pacing Pulse Width: 0.4 ms
Lead Channel Setting Pacing Pulse Width: 0.7 ms
Lead Channel Setting Sensing Sensitivity: 2 mV
MDC IDC MSMT BATTERY VOLTAGE: 2.98 V
MDC IDC MSMT LEADCHNL RA PACING THRESHOLD AMPLITUDE: 0.75 V
MDC IDC MSMT LEADCHNL RA SENSING INTR AMPL: 3.4 mV
MDC IDC MSMT LEADCHNL RV PACING THRESHOLD AMPLITUDE: 0.75 V
MDC IDC PG MODEL: 3242
MDC IDC SET LEADCHNL LV PACING AMPLITUDE: 2.375
Pulse Gen Serial Number: 7554613

## 2014-09-19 MED ORDER — CEPHALEXIN 500 MG PO CAPS: 500.0000 mg | ORAL_CAPSULE | Freq: Three times a day (TID) | ORAL | Status: DC

## 2014-09-19 NOTE — Progress Notes (Signed)
Patient Care Team: [name], MD as PCP - General (Pediatrics) Deboraha Sprang, MD as Consulting Physician (Cardiology)   HPI  [name] is a 81 y.o. female  Seen in followup for CHF with previous CRT with LV lead dislodgement confirmed by CXR  in December she underwent downgrade of her device for ICD-->>   this is pacemaker insertion of a new LV lead.  Efforts for afterload reduction complicated by hypotension   She underwent cath for the above and chest pain>>LV dysfunction mod 35-40% with [place] MR   She has fallen again this year and broke the other hip   She s otherwise stable        Past Medical History  Diagnosis Date  . Nonischemic cardiomyopathy     Cath without coronary disease 2006  . Systolic CHF   . Hyperlipidemia   . Hypertension   . Osteoporosis   . Urge incontinence   . Raynaud phenomenon   . PAF (paroxysmal atrial fibrillation)     Short bursts of PAF. Asymptomatic.  Sees Dr Rex Kras.  . Biventricular ICD (implantable cardioverter-defibrillator)-[employer]     Date of implant 2007 generator change 2012  . Sinus tachycardia   . Macro dislodgment of the left ventricular lead 09/09/2012    Past Surgical History  Procedure Laterality Date  . Appendectomy    . Biventricular icd implant  06/28/2010; 03/28/2013    STJ CRTD implanted 2012 with dislodgement of LV lead 2014; 03/28/2013 CRTP placed with new LV lead by Dr Caryl Comes  . Bladder tack    . Cardiac catheterization  03/2005    EF 25%, Normal arteries, Nonischemic CM  . Abdominal hysterectomy  1970s  . Suprapubic urethrovesicular suspension  1992  . Hip arthroplasty Left 12/20/2013    Procedure: LEFT HIP HEMI ARTHROPLASTY;  Surgeon: Meredith Pel, MD;  Location: [place];  Service: Orthopedics;  Laterality: Left;  . Left and right heart catheterization with coronary angiogram N/A 12/13/2012    Procedure: LEFT AND RIGHT HEART CATHETERIZATION WITH CORONARY ANGIOGRAM;  Surgeon: Blane Ohara, MD;  Location: [facility] CATH LAB;  Service: Cardiovascular;  Laterality: N/A;  . Bi-ventricular pacemaker insertion N/A 03/28/2013    Procedure: BI-VENTRICULAR PACEMAKER INSERTION (CRT-P);  Surgeon: Deboraha Sprang, MD;  Location: [facility] CATH LAB;  Service: Cardiovascular;  Laterality: N/A;  . Total hip arthroplasty Right 09/04/2014    Procedure: BIPOLAR HIP ARTHROPLASTY ANTERIOR APPROACH;  Surgeon: Mcarthur Rossetti, MD;  Location: [place];  Service: Orthopedics;  Laterality: Right;    Current Outpatient Prescriptions  Medication Sig Dispense Refill  . acetaminophen (TYLENOL) 650 MG CR tablet Take 1,300 mg by mouth 2 (two) times daily.    Marland Kitchen aspirin 81 MG tablet Take 81 mg by mouth daily.    . carvedilol (COREG) 3.125 MG tablet Take 1 tablet (3.125 mg total) by mouth 2 (two) times daily. 180 tablet 3  . Cranberry (SM CRANBERRY) 300 MG tablet Take 600 mg by mouth 2 (two) times daily.     . furosemide (LASIX) 40 MG tablet Take 1 tablet (40mg  total) by mouth M/W/F 12 tablet 6  . oxyCODONE-acetaminophen (ROXICET) 5-325 MG per tablet Take 1-2 tablets by mouth every 4 (four) hours as needed. 60 tablet 0  . Zinc 50 MG CAPS Take 50 mg by mouth daily.     No current facility-administered medications for this visit.    Allergies  Allergen Reactions  . Sulfamethoxazole  REACTION: unspecified    Review of Systems negative except from HPI and PMH  Physical Exam BP 138/64 mmHg  Pulse 100  Ht 5\' 5"  (1.651 m)  Wt 114 lb 3.2 oz (51.801 kg)  BMI 19.00 kg/m2  Well developed and nourished in no acute distress HENT normal Neck supple with JVP-<10 Clear Regular but rapid rate and rhythm, no murmurs or gallops Abd-soft with active BS No Clubbing cyanosis 2+ aymmmetiricedema Skin-warm and dry A & Oriented  Grossly normal sensory and motor function  ECG demonstrates sinus rhythm with PACs versus pacing at a rate of 104   Assessment and plan  Nonischemic cardiomyopathy  Congestive heart  failure-chronic systolic  Implantable device-CRT>>> defibrillator-->pacemaker The patient's device was interrogated and the information was fully reviewed.  The device was reprogrammed to increase the max tracking rate  Sinus tachycardia   continue current meds  She is euvolemic

## 2014-09-19 NOTE — Telephone Encounter (Signed)
Called pt. She reports dysuria and pressure. Had to have foley catheter after her surgery and thinks has developed UTI. Denies fevers or back pain. Would have trouble getting into clinic to be seen right now and would like abx called in. This is definitely reasonable. Pt appreciative.  Reviewed available prior sensitivity data. Pt requested cipro as pt reports this has always worked well for her but it now appears there is a new FDA alert out about flouroquinolones not being used in UTI's due to risk outweighing benefits. Will rx keflex. rx sent to pharmacy. Attempted to call pt back and let her know that I instead sent in keflex, but line was busy multiple times. Will try to call again tomorrow.  [name], MD

## 2014-09-19 NOTE — Telephone Encounter (Signed)
Was just released from rehab Is developing a UTI after having catherer Could she get an antibotic called in? [place] Please advise

## 2014-09-19 NOTE — Patient Instructions (Signed)
Medication Instructions:  Your physician recommends that you continue on your current medications as directed. Please refer to the Current Medication list given to you today.  Labwork: None ordered  Testing/Procedures: None ordered  Follow-Up: Remote monitoring is used to monitor your Pacemaker of ICD from home. This monitoring reduces the number of office visits required to check your device to one time per year. It allows [place] to keep an eye on the functioning of your device to ensure it is working properly. You are scheduled for a device check from home on 12/19/14. You may send your transmission at any time that day. If you have a wireless device, the transmission will be sent automatically. After your physician reviews your transmission, you will receive a postcard with your next transmission date.  Your physician wants you to follow-up in: 6 months with Chanetta Marshall, NP.  You will receive a reminder letter in the mail two months in advance. If you don't receive a letter, please call our office to schedule the follow-up appointment.  Your physician wants you to follow-up in: 1 year with Dr. Caryl Comes.  You will receive a reminder letter in the mail two months in advance. If you don't receive a letter, please call our office to schedule the follow-up appointment.  Thank you for choosing [facility]!!

## 2014-09-20 DIAGNOSIS — I10 Essential (primary) hypertension: Secondary | ICD-10-CM | POA: Diagnosis not present

## 2014-09-20 DIAGNOSIS — M810 Age-related osteoporosis without current pathological fracture: Secondary | ICD-10-CM | POA: Diagnosis not present

## 2014-09-20 DIAGNOSIS — Z9181 History of falling: Secondary | ICD-10-CM | POA: Diagnosis not present

## 2014-09-20 DIAGNOSIS — Z95810 Presence of automatic (implantable) cardiac defibrillator: Secondary | ICD-10-CM | POA: Diagnosis not present

## 2014-09-20 DIAGNOSIS — Z471 Aftercare following joint replacement surgery: Secondary | ICD-10-CM | POA: Diagnosis not present

## 2014-09-20 DIAGNOSIS — I5020 Unspecified systolic (congestive) heart failure: Secondary | ICD-10-CM | POA: Diagnosis not present

## 2014-09-20 DIAGNOSIS — G629 Polyneuropathy, unspecified: Secondary | ICD-10-CM | POA: Diagnosis not present

## 2014-09-20 DIAGNOSIS — Z96641 Presence of right artificial hip joint: Secondary | ICD-10-CM | POA: Diagnosis not present

## 2014-09-20 DIAGNOSIS — I429 Cardiomyopathy, unspecified: Secondary | ICD-10-CM | POA: Diagnosis not present

## 2014-09-20 NOTE — Telephone Encounter (Signed)
Called patient and explained why I sent in keflex. She was appreciative.  [name], MD

## 2014-09-22 DIAGNOSIS — I10 Essential (primary) hypertension: Secondary | ICD-10-CM | POA: Diagnosis not present

## 2014-09-22 DIAGNOSIS — Z471 Aftercare following joint replacement surgery: Secondary | ICD-10-CM | POA: Diagnosis not present

## 2014-09-22 DIAGNOSIS — I5020 Unspecified systolic (congestive) heart failure: Secondary | ICD-10-CM | POA: Diagnosis not present

## 2014-09-22 DIAGNOSIS — G629 Polyneuropathy, unspecified: Secondary | ICD-10-CM | POA: Diagnosis not present

## 2014-09-22 DIAGNOSIS — Z96641 Presence of right artificial hip joint: Secondary | ICD-10-CM | POA: Diagnosis not present

## 2014-09-22 DIAGNOSIS — Z9181 History of falling: Secondary | ICD-10-CM | POA: Diagnosis not present

## 2014-09-22 DIAGNOSIS — M810 Age-related osteoporosis without current pathological fracture: Secondary | ICD-10-CM | POA: Diagnosis not present

## 2014-09-22 DIAGNOSIS — I429 Cardiomyopathy, unspecified: Secondary | ICD-10-CM | POA: Diagnosis not present

## 2014-09-22 DIAGNOSIS — Z95810 Presence of automatic (implantable) cardiac defibrillator: Secondary | ICD-10-CM | POA: Diagnosis not present

## 2014-09-25 DIAGNOSIS — Z96641 Presence of right artificial hip joint: Secondary | ICD-10-CM | POA: Diagnosis not present

## 2014-09-25 DIAGNOSIS — I5020 Unspecified systolic (congestive) heart failure: Secondary | ICD-10-CM | POA: Diagnosis not present

## 2014-09-25 DIAGNOSIS — G629 Polyneuropathy, unspecified: Secondary | ICD-10-CM | POA: Diagnosis not present

## 2014-09-25 DIAGNOSIS — Z9181 History of falling: Secondary | ICD-10-CM | POA: Diagnosis not present

## 2014-09-25 DIAGNOSIS — I10 Essential (primary) hypertension: Secondary | ICD-10-CM | POA: Diagnosis not present

## 2014-09-25 DIAGNOSIS — Z95810 Presence of automatic (implantable) cardiac defibrillator: Secondary | ICD-10-CM | POA: Diagnosis not present

## 2014-09-25 DIAGNOSIS — I429 Cardiomyopathy, unspecified: Secondary | ICD-10-CM | POA: Diagnosis not present

## 2014-09-25 DIAGNOSIS — M810 Age-related osteoporosis without current pathological fracture: Secondary | ICD-10-CM | POA: Diagnosis not present

## 2014-09-25 DIAGNOSIS — Z471 Aftercare following joint replacement surgery: Secondary | ICD-10-CM | POA: Diagnosis not present

## 2014-09-29 DIAGNOSIS — I5020 Unspecified systolic (congestive) heart failure: Secondary | ICD-10-CM | POA: Diagnosis not present

## 2014-09-29 DIAGNOSIS — M810 Age-related osteoporosis without current pathological fracture: Secondary | ICD-10-CM | POA: Diagnosis not present

## 2014-09-29 DIAGNOSIS — I429 Cardiomyopathy, unspecified: Secondary | ICD-10-CM | POA: Diagnosis not present

## 2014-09-29 DIAGNOSIS — Z95810 Presence of automatic (implantable) cardiac defibrillator: Secondary | ICD-10-CM | POA: Diagnosis not present

## 2014-09-29 DIAGNOSIS — G629 Polyneuropathy, unspecified: Secondary | ICD-10-CM | POA: Diagnosis not present

## 2014-09-29 DIAGNOSIS — I10 Essential (primary) hypertension: Secondary | ICD-10-CM | POA: Diagnosis not present

## 2014-09-29 DIAGNOSIS — Z96641 Presence of right artificial hip joint: Secondary | ICD-10-CM | POA: Diagnosis not present

## 2014-09-29 DIAGNOSIS — Z9181 History of falling: Secondary | ICD-10-CM | POA: Diagnosis not present

## 2014-09-29 DIAGNOSIS — Z471 Aftercare following joint replacement surgery: Secondary | ICD-10-CM | POA: Diagnosis not present

## 2014-10-04 DIAGNOSIS — Z96641 Presence of right artificial hip joint: Secondary | ICD-10-CM | POA: Diagnosis not present

## 2014-10-04 DIAGNOSIS — I5020 Unspecified systolic (congestive) heart failure: Secondary | ICD-10-CM | POA: Diagnosis not present

## 2014-10-04 DIAGNOSIS — M810 Age-related osteoporosis without current pathological fracture: Secondary | ICD-10-CM | POA: Diagnosis not present

## 2014-10-04 DIAGNOSIS — Z471 Aftercare following joint replacement surgery: Secondary | ICD-10-CM | POA: Diagnosis not present

## 2014-10-04 DIAGNOSIS — I429 Cardiomyopathy, unspecified: Secondary | ICD-10-CM | POA: Diagnosis not present

## 2014-10-04 DIAGNOSIS — I10 Essential (primary) hypertension: Secondary | ICD-10-CM | POA: Diagnosis not present

## 2014-10-04 DIAGNOSIS — Z9181 History of falling: Secondary | ICD-10-CM | POA: Diagnosis not present

## 2014-10-04 DIAGNOSIS — G629 Polyneuropathy, unspecified: Secondary | ICD-10-CM | POA: Diagnosis not present

## 2014-10-04 DIAGNOSIS — Z95810 Presence of automatic (implantable) cardiac defibrillator: Secondary | ICD-10-CM | POA: Diagnosis not present

## 2014-10-06 DIAGNOSIS — M810 Age-related osteoporosis without current pathological fracture: Secondary | ICD-10-CM | POA: Diagnosis not present

## 2014-10-06 DIAGNOSIS — Z96641 Presence of right artificial hip joint: Secondary | ICD-10-CM | POA: Diagnosis not present

## 2014-10-06 DIAGNOSIS — Z9181 History of falling: Secondary | ICD-10-CM | POA: Diagnosis not present

## 2014-10-06 DIAGNOSIS — Z471 Aftercare following joint replacement surgery: Secondary | ICD-10-CM | POA: Diagnosis not present

## 2014-10-06 DIAGNOSIS — I10 Essential (primary) hypertension: Secondary | ICD-10-CM | POA: Diagnosis not present

## 2014-10-06 DIAGNOSIS — I5020 Unspecified systolic (congestive) heart failure: Secondary | ICD-10-CM | POA: Diagnosis not present

## 2014-10-06 DIAGNOSIS — G629 Polyneuropathy, unspecified: Secondary | ICD-10-CM | POA: Diagnosis not present

## 2014-10-06 DIAGNOSIS — I429 Cardiomyopathy, unspecified: Secondary | ICD-10-CM | POA: Diagnosis not present

## 2014-10-06 DIAGNOSIS — Z95810 Presence of automatic (implantable) cardiac defibrillator: Secondary | ICD-10-CM | POA: Diagnosis not present

## 2014-10-11 DIAGNOSIS — Z471 Aftercare following joint replacement surgery: Secondary | ICD-10-CM | POA: Diagnosis not present

## 2014-10-11 DIAGNOSIS — G629 Polyneuropathy, unspecified: Secondary | ICD-10-CM | POA: Diagnosis not present

## 2014-10-11 DIAGNOSIS — Z95810 Presence of automatic (implantable) cardiac defibrillator: Secondary | ICD-10-CM | POA: Diagnosis not present

## 2014-10-11 DIAGNOSIS — Z96641 Presence of right artificial hip joint: Secondary | ICD-10-CM | POA: Diagnosis not present

## 2014-10-11 DIAGNOSIS — Z9181 History of falling: Secondary | ICD-10-CM | POA: Diagnosis not present

## 2014-10-11 DIAGNOSIS — I429 Cardiomyopathy, unspecified: Secondary | ICD-10-CM | POA: Diagnosis not present

## 2014-10-11 DIAGNOSIS — I5020 Unspecified systolic (congestive) heart failure: Secondary | ICD-10-CM | POA: Diagnosis not present

## 2014-10-11 DIAGNOSIS — M810 Age-related osteoporosis without current pathological fracture: Secondary | ICD-10-CM | POA: Diagnosis not present

## 2014-10-11 DIAGNOSIS — I10 Essential (primary) hypertension: Secondary | ICD-10-CM | POA: Diagnosis not present

## 2014-10-13 DIAGNOSIS — Z9181 History of falling: Secondary | ICD-10-CM | POA: Diagnosis not present

## 2014-10-13 DIAGNOSIS — M810 Age-related osteoporosis without current pathological fracture: Secondary | ICD-10-CM | POA: Diagnosis not present

## 2014-10-13 DIAGNOSIS — Z471 Aftercare following joint replacement surgery: Secondary | ICD-10-CM | POA: Diagnosis not present

## 2014-10-13 DIAGNOSIS — I429 Cardiomyopathy, unspecified: Secondary | ICD-10-CM | POA: Diagnosis not present

## 2014-10-13 DIAGNOSIS — G629 Polyneuropathy, unspecified: Secondary | ICD-10-CM | POA: Diagnosis not present

## 2014-10-13 DIAGNOSIS — I5020 Unspecified systolic (congestive) heart failure: Secondary | ICD-10-CM | POA: Diagnosis not present

## 2014-10-13 DIAGNOSIS — I10 Essential (primary) hypertension: Secondary | ICD-10-CM | POA: Diagnosis not present

## 2014-10-13 DIAGNOSIS — Z96641 Presence of right artificial hip joint: Secondary | ICD-10-CM | POA: Diagnosis not present

## 2014-10-13 DIAGNOSIS — Z95810 Presence of automatic (implantable) cardiac defibrillator: Secondary | ICD-10-CM | POA: Diagnosis not present

## 2014-10-20 DIAGNOSIS — Z96641 Presence of right artificial hip joint: Secondary | ICD-10-CM | POA: Diagnosis not present

## 2014-10-20 DIAGNOSIS — G629 Polyneuropathy, unspecified: Secondary | ICD-10-CM | POA: Diagnosis not present

## 2014-10-20 DIAGNOSIS — I10 Essential (primary) hypertension: Secondary | ICD-10-CM | POA: Diagnosis not present

## 2014-10-20 DIAGNOSIS — Z471 Aftercare following joint replacement surgery: Secondary | ICD-10-CM | POA: Diagnosis not present

## 2014-10-20 DIAGNOSIS — M810 Age-related osteoporosis without current pathological fracture: Secondary | ICD-10-CM | POA: Diagnosis not present

## 2014-10-20 DIAGNOSIS — Z9181 History of falling: Secondary | ICD-10-CM | POA: Diagnosis not present

## 2014-10-20 DIAGNOSIS — Z95810 Presence of automatic (implantable) cardiac defibrillator: Secondary | ICD-10-CM | POA: Diagnosis not present

## 2014-10-20 DIAGNOSIS — I5020 Unspecified systolic (congestive) heart failure: Secondary | ICD-10-CM | POA: Diagnosis not present

## 2014-10-20 DIAGNOSIS — I429 Cardiomyopathy, unspecified: Secondary | ICD-10-CM | POA: Diagnosis not present

## 2014-10-27 DIAGNOSIS — Z96641 Presence of right artificial hip joint: Secondary | ICD-10-CM | POA: Diagnosis not present

## 2014-10-27 DIAGNOSIS — I5020 Unspecified systolic (congestive) heart failure: Secondary | ICD-10-CM | POA: Diagnosis not present

## 2014-10-27 DIAGNOSIS — Z95810 Presence of automatic (implantable) cardiac defibrillator: Secondary | ICD-10-CM | POA: Diagnosis not present

## 2014-10-27 DIAGNOSIS — I10 Essential (primary) hypertension: Secondary | ICD-10-CM | POA: Diagnosis not present

## 2014-10-27 DIAGNOSIS — M810 Age-related osteoporosis without current pathological fracture: Secondary | ICD-10-CM | POA: Diagnosis not present

## 2014-10-27 DIAGNOSIS — I429 Cardiomyopathy, unspecified: Secondary | ICD-10-CM | POA: Diagnosis not present

## 2014-10-27 DIAGNOSIS — Z471 Aftercare following joint replacement surgery: Secondary | ICD-10-CM | POA: Diagnosis not present

## 2014-10-27 DIAGNOSIS — Z9181 History of falling: Secondary | ICD-10-CM | POA: Diagnosis not present

## 2014-10-27 DIAGNOSIS — G629 Polyneuropathy, unspecified: Secondary | ICD-10-CM | POA: Diagnosis not present

## 2014-11-16 ENCOUNTER — Encounter: Payer: Self-pay | Admitting: *Deleted

## 2014-12-15 ENCOUNTER — Other Ambulatory Visit: Payer: Self-pay

## 2014-12-15 NOTE — Telephone Encounter (Signed)
Medication Detail      Disp Refills Start End     carvedilol (COREG) 3.125 MG tablet 180 tablet 3 03/10/2014     Sig - Route: Take 1 tablet (3.125 mg total) by mouth 2 (two) times daily. - Oral    E-Prescribing Status: Receipt confirmed by pharmacy (03/10/2014 10:41 AM EST)     Pharmacy    [place], [place]

## 2014-12-19 ENCOUNTER — Encounter: Payer: Self-pay | Admitting: Internal Medicine

## 2014-12-19 ENCOUNTER — Ambulatory Visit (INDEPENDENT_AMBULATORY_CARE_PROVIDER_SITE_OTHER): Payer: Medicare HMO | Admitting: *Deleted

## 2014-12-19 DIAGNOSIS — I429 Cardiomyopathy, unspecified: Secondary | ICD-10-CM

## 2014-12-19 DIAGNOSIS — I428 Other cardiomyopathies: Secondary | ICD-10-CM

## 2014-12-19 DIAGNOSIS — I5022 Chronic systolic (congestive) heart failure: Secondary | ICD-10-CM | POA: Diagnosis not present

## 2014-12-19 NOTE — Progress Notes (Signed)
Remote pacemaker transmission.   

## 2014-12-25 LAB — CUP PACEART REMOTE DEVICE CHECK
Battery Remaining Longevity: 77 mo
Brady Statistic AS VS Percent: 1.4 %
Brady Statistic RA Percent Paced: 1 %
Date Time Interrogation Session: 20160920093605
Lead Channel Impedance Value: 350 Ohm
Lead Channel Impedance Value: 440 Ohm
Lead Channel Impedance Value: 510 Ohm
Lead Channel Pacing Threshold Amplitude: 0.75 V
Lead Channel Pacing Threshold Amplitude: 0.75 V
Lead Channel Pacing Threshold Amplitude: 1.75 V
Lead Channel Pacing Threshold Pulse Width: 0.4 ms
Lead Channel Pacing Threshold Pulse Width: 0.4 ms
Lead Channel Pacing Threshold Pulse Width: 0.7 ms
Lead Channel Setting Pacing Amplitude: 2 V
Lead Channel Setting Pacing Amplitude: 2.75 V
Lead Channel Setting Pacing Pulse Width: 0.4 ms
Lead Channel Setting Pacing Pulse Width: 0.7 ms
MDC IDC MSMT BATTERY REMAINING PERCENTAGE: 95.5 %
MDC IDC MSMT BATTERY VOLTAGE: 2.96 V
MDC IDC MSMT LEADCHNL RA SENSING INTR AMPL: 2.9 mV
MDC IDC MSMT LEADCHNL RV SENSING INTR AMPL: 12 mV
MDC IDC SET LEADCHNL RV PACING AMPLITUDE: 2.5 V
MDC IDC SET LEADCHNL RV SENSING SENSITIVITY: 2 mV
MDC IDC STAT BRADY AP VP PERCENT: 1 %
MDC IDC STAT BRADY AP VS PERCENT: 1 %
MDC IDC STAT BRADY AS VP PERCENT: 97 %
Pulse Gen Model: 3242
Pulse Gen Serial Number: 7554613

## 2015-01-09 ENCOUNTER — Encounter: Payer: Self-pay | Admitting: Cardiology

## 2015-01-17 ENCOUNTER — Ambulatory Visit (INDEPENDENT_AMBULATORY_CARE_PROVIDER_SITE_OTHER): Payer: Commercial Managed Care - HMO | Admitting: Internal Medicine

## 2015-01-17 ENCOUNTER — Encounter: Payer: Self-pay | Admitting: Internal Medicine

## 2015-01-17 VITALS — BP 118/78 | HR 97 | Temp 98.2°F | Wt 108.0 lb

## 2015-01-17 DIAGNOSIS — N390 Urinary tract infection, site not specified: Secondary | ICD-10-CM | POA: Diagnosis not present

## 2015-01-17 DIAGNOSIS — R300 Dysuria: Secondary | ICD-10-CM

## 2015-01-17 LAB — POCT URINALYSIS DIP (MANUAL ENTRY)
Bilirubin, UA: NEGATIVE
Glucose, UA: NEGATIVE
Ketones, POC UA: NEGATIVE
Nitrite, UA: POSITIVE — AB
PH UA: 7
Protein Ur, POC: NEGATIVE
RBC UA: NEGATIVE
Spec Grav, UA: 1.02
UROBILINOGEN UA: 0.2

## 2015-01-17 MED ORDER — CEPHALEXIN 500 MG PO CAPS: 500.0000 mg | ORAL_CAPSULE | Freq: Two times a day (BID) | ORAL | Status: DC

## 2015-01-17 NOTE — Progress Notes (Signed)
Patient ID: [name], female   DOB: [birth year], 81 y.o.   MRN: [id number]  Subjective:   CC: possible UTI   HPI:  - 2-3 day history of dysuria, increased frequency of urination, urinary urgency and suprapubic fullness - No hematuria, vaginal discharge, fevers, or chills, no flank pain - Notes of history of UTIs with last one in June (treated with keflex)  - Noted HR of 104. Has history of A fib on Coreg. Denies palpitations, chest pain, lightheadedness. Repeat HR 97.   Review of Systems - Per HPI.   PMH and medications reviewd Smoking status: former smoker     Objective:  Physical Exam BP 118/78 mmHg  Pulse 97  Temp(Src) 98.2 F (36.8 C) (Oral)  Wt 108 lb (48.988 kg) GEN: NAD, nontoxic appearing  CV: RRR, no murmurs, rubs, or gallops PULM: CTAB, normal effort ABD: Soft, mild suprapubic tenderness to palpation, nondistended, NABS, no organomegaly; no CVA tenderness SKIN: No rash or cyanosis; warm and well-perfused EXTR: no calf tenderness; nonpitting edema bilaterally PSYCH: Mood and affect euthymic, normal rate and volume of speech NEURO: Awake, alert, no focal deficits grossly, normal speech    Assessment:     [name] is a 81 y.o. female with h/o Afib on Coreg with pacemaker here for dysuria. UA consistent with UTI. Elevated HR possibly due to UTI. Patient is asymptomatic as she denies palpitations, lightheadedness, chest pain.     Plan:     # See problem list and after visit summary for problem-specific plans.     Follow-up: Follow up if symptoms do not improve or if they worsen   Smiley Houseman, MD [place]

## 2015-01-17 NOTE — Patient Instructions (Signed)
Thank you for coming in  - I sent prescription for Keflex to your pharmacy. Please take 1 tablet twice a day for 7 days - please return to clinic if your symptoms do not improve or if they worsen

## 2015-01-17 NOTE — Assessment & Plan Note (Signed)
UA significant for + nitrites and small LE. Symptoms of dysuria, increased, frequency, and urgency. No flank pain, No fevers or chills. Notes of hx of UTI since teenager.  - Keflex 500mg  BID for 7 days (previous urine culture sensitive to cephalosporins) - Urine culture (patient has been treated with Keflex for UTI in the recent past; will obtain to determine if resistant to cephalosporins)  - return precautions given

## 2015-01-19 LAB — URINE CULTURE: Colony Count: >=100000

## 2015-01-25 ENCOUNTER — Telehealth: Payer: Self-pay | Admitting: Internal Medicine

## 2015-01-25 DIAGNOSIS — N3090 Cystitis, unspecified without hematuria: Secondary | ICD-10-CM

## 2015-01-25 MED ORDER — CIPROFLOXACIN HCL 250 MG PO TABS: 250.0000 mg | ORAL_TABLET | Freq: Two times a day (BID) | ORAL | Status: DC

## 2015-01-25 NOTE — Telephone Encounter (Signed)
Spoke with patient to inform her that her urine culture showed resistance to cephalosporin (patient was initially prescribed Keflex). Informed patient that a prescription for Ciprofloxacin 250mg  BID for 3 days was sent to her pharmacy to be picked up. Patient understands and did not have questions. Advised patient to seek medical attention if she starts having fevers/chills/back pain or if symptoms worsen despite antibiotic treatment. Advised patient to call clinic if she has further questions or concerns.

## 2015-02-05 ENCOUNTER — Ambulatory Visit (INDEPENDENT_AMBULATORY_CARE_PROVIDER_SITE_OTHER): Payer: Commercial Managed Care - HMO | Admitting: Family Medicine

## 2015-02-05 ENCOUNTER — Telehealth: Payer: Self-pay | Admitting: Family Medicine

## 2015-02-05 ENCOUNTER — Encounter: Payer: Self-pay | Admitting: Family Medicine

## 2015-02-05 VITALS — BP 134/72 | HR 98 | Temp 97.8°F | Ht 65.0 in | Wt 111.6 lb

## 2015-02-05 DIAGNOSIS — R399 Unspecified symptoms and signs involving the genitourinary system: Secondary | ICD-10-CM

## 2015-02-05 LAB — POCT URINALYSIS DIPSTICK
BILIRUBIN UA: NEGATIVE
Glucose, UA: NEGATIVE
Ketones, UA: NEGATIVE
NITRITE UA: NEGATIVE
Spec Grav, UA: 1.025
UROBILINOGEN UA: 0.2
pH, UA: 6.5

## 2015-02-05 MED ORDER — AMOXICILLIN-POT CLAVULANATE 875-125 MG PO TABS: 1.0000 | ORAL_TABLET | Freq: Two times a day (BID) | ORAL | Status: DC

## 2015-02-05 NOTE — Patient Instructions (Signed)
For UTI: -take augmentin 1 pill twice a day for 7 days -call or return if this isn't helping  Good luck at the polls! :)  Be well, Dr. [name]

## 2015-02-05 NOTE — Progress Notes (Signed)
Date of Visit: 02/05/2015   HPI:  Pt presents for a same day appointment to discuss possible UTI.  Endorses hesitancy, pressure, dysuria. No fever or nausea/vomiting. Eating and drinking well. Has had ongoing UTI symptoms since she was seen by Dr. Dallas Schimke on 10/19. Initially had been prescribed keflex, but urine grew klebsiella species resistant to cefazolin, was called and switched to ciprofloxacin. Has completed course of cipro but still has symptoms. Wants to go ahead and get treated for this as she is planning to work at [employer] all day tomorrow.  ROS: See HPI  [place]: history of afib, chronic systolic CHF, ICD in place, bilateral hip fractures, hyperlipidemia, hypertension, osteoporosis  PHYSICAL EXAM: BP 134/72 mmHg  Pulse 98  Temp(Src) 97.8 F (36.6 C) (Oral)  Ht 5\' 5"  (1.651 m)  Wt 111 lb 9.6 oz (50.621 kg)  BMI 18.57 kg/m2 Gen: NAD pleasant, cooperative HEENT: normocephalic, atraumatic, MMM Abdomen: soft. Mild suprapubic tenderness. No rigidity, rebound, guarding, or organomegaly Neuro: alert, interactive, speech normal Back: no CVA tenderness bilaterally   ASSESSMENT/PLAN:  1. UTI symptoms - urinalysis remains indicative of UTI, congruent with symptoms patient is reporting. Antibiotic choice is tricky - she is allergic to sulfa, did not improve with cipro, and is resistant to first generation cephalosporins. Not a great candidate for nitrofurantoin due to age. Culture does show sensitivity to augmentin, so will initiate this. Well appearing without signs of systemic illness. F/u as needed if symptoms worsen or do not improve.   FOLLOW UP: F/u as needed if symptoms worsen or do not improve.   [place]. [name], [place]

## 2015-02-20 ENCOUNTER — Encounter: Payer: Self-pay | Admitting: Family Medicine

## 2015-02-20 ENCOUNTER — Ambulatory Visit (INDEPENDENT_AMBULATORY_CARE_PROVIDER_SITE_OTHER): Payer: Commercial Managed Care - HMO | Admitting: Family Medicine

## 2015-02-20 VITALS — BP 141/60 | HR 92 | Temp 97.5°F | Ht 65.0 in | Wt 112.6 lb

## 2015-02-20 DIAGNOSIS — R399 Unspecified symptoms and signs involving the genitourinary system: Secondary | ICD-10-CM

## 2015-02-20 LAB — POCT URINALYSIS DIPSTICK
Bilirubin, UA: NEGATIVE
Glucose, UA: NEGATIVE
Ketones, UA: NEGATIVE
Nitrite, UA: POSITIVE
PH UA: 5.5
PROTEIN UA: NEGATIVE
RBC UA: NEGATIVE
Spec Grav, UA: >=1.03
UROBILINOGEN UA: 0.2

## 2015-02-20 MED ORDER — AMOXICILLIN-POT CLAVULANATE 875-125 MG PO TABS: 1.0000 | ORAL_TABLET | Freq: Two times a day (BID) | ORAL | Status: DC

## 2015-02-20 NOTE — Progress Notes (Signed)
Date of Visit: 02/20/2015   HPI:  Pt presents for a same day appointment to discuss UTI symptoms.  Her recent course is as follows: 10/19: Seen by Dr. Dallas Schimke. Initially had been prescribed keflex, but urine grew klebsiella species resistant to cefazolin, was called and switched to ciprofloxacin.  11/7: seen by myself, had completed cipro course with no improvement in symptoms. UA with signs of infection, not enough urine to culture. Given rx for augmentin  Patient completed course of augmentin with improvement and resolution of symptoms, but on Saturday night symptoms returned. Has dysuria, urgency, and hesitancy. No fevers. Does feel fatigued chronically. No back pain. Drinking well. Has taken pyridium which has helped the pain.   ROS: See HPI  [place]: history of atrial fibrillation, systolic CHF with biV ICD in place, bilateral hip fractures, osteoporosis, hyperlipidemia, hypertension  PHYSICAL EXAM: BP 141/60 mmHg  Pulse 92  Temp(Src) 97.5 F (36.4 C) (Oral)  Ht 5\' 5"  (1.651 m)  Wt 112 lb 9.6 oz (51.075 kg)  BMI 18.74 kg/m2 Gen: NAD, pleasant, cooperative HEENT: normocephalic, atraumatic, moist mucous membranes  Abdomen: soft, nontender to palpation. No peritoneal signs or masses Back: no CVA tenderness on either side  ASSESSMENT/PLAN:  1. UTI - recurrence of symptoms after initial improvement. Yet again, UA with signs of infection but sample not large enough for microscopy or culture. Discussed options with patient, including catheterization for culture, or additional round of antibiotics. She prefers antibiotics presently. Will rx augmentin as klebsiella was sensitive to this previously, and patient had improvement on it. Follow up if not improving.  FOLLOW UP: F/u as needed if symptoms worsen or do not improve.   [place]. [name], [place]

## 2015-02-20 NOTE — Patient Instructions (Signed)
Let's try another round of antibiotics (augmentin) If that doesn't help we'll need to get more urine and send a culture Call with any questions  Be well, Dr. [name]   Urinary Tract Infection Urinary tract infections (UTIs) can develop anywhere along your urinary tract. Your urinary tract is your body's drainage system for removing wastes and extra water. Your urinary tract includes two kidneys, two ureters, a bladder, and a urethra. Your kidneys are a pair of bean-shaped organs. Each kidney is about the size of your fist. They are located below your ribs, one on each side of your spine. CAUSES Infections are caused by microbes, which are microscopic organisms, including fungi, viruses, and bacteria. These organisms are so small that they can only be seen through a microscope. Bacteria are the microbes that most commonly cause UTIs. SYMPTOMS  Symptoms of UTIs may vary by age and gender of the patient and by the location of the infection. Symptoms in young women typically include a frequent and intense urge to urinate and a painful, burning feeling in the bladder or urethra during urination. Older women and men are more likely to be tired, shaky, and weak and have muscle aches and abdominal pain. A fever may mean the infection is in your kidneys. Other symptoms of a kidney infection include pain in your back or sides below the ribs, nausea, and vomiting. DIAGNOSIS To diagnose a UTI, your caregiver will ask you about your symptoms. Your caregiver will also ask you to provide a urine sample. The urine sample will be tested for bacteria and white blood cells. White blood cells are made by your body to help fight infection. TREATMENT  Typically, UTIs can be treated with medication. Because most UTIs are caused by a bacterial infection, they usually can be treated with the use of antibiotics. The choice of antibiotic and length of treatment depend on your symptoms and the type of bacteria causing your  infection. HOME CARE INSTRUCTIONS  If you were prescribed antibiotics, take them exactly as your caregiver instructs you. Finish the medication even if you feel better after you have only taken some of the medication.  Drink enough water and fluids to keep your urine clear or pale yellow.  Avoid caffeine, tea, and carbonated beverages. They tend to irritate your bladder.  Empty your bladder often. Avoid holding urine for long periods of time.  Empty your bladder before and after sexual intercourse.  After a bowel movement, women should cleanse from front to back. Use each tissue only once. SEEK MEDICAL CARE IF:   You have back pain.  You develop a fever.  Your symptoms do not begin to resolve within 3 days. SEEK IMMEDIATE MEDICAL CARE IF:   You have severe back pain or lower abdominal pain.  You develop chills.  You have nausea or vomiting.  You have continued burning or discomfort with urination. MAKE SURE YOU:   Understand these instructions.  Will watch your condition.  Will get help right away if you are not doing well or get worse.   This information is not intended to replace advice given to you by your health care provider. Make sure you discuss any questions you have with your health care provider.   Document Released: 12/25/2004 Document Revised: 12/06/2014 Document Reviewed: 04/25/2011 Elsevier Interactive Patient Education [employer].

## 2015-03-21 ENCOUNTER — Encounter: Payer: Self-pay | Admitting: Internal Medicine

## 2015-03-21 ENCOUNTER — Encounter: Payer: Self-pay | Admitting: Nurse Practitioner

## 2015-03-21 ENCOUNTER — Ambulatory Visit (INDEPENDENT_AMBULATORY_CARE_PROVIDER_SITE_OTHER): Payer: Commercial Managed Care - HMO | Admitting: Nurse Practitioner

## 2015-03-21 DIAGNOSIS — I10 Essential (primary) hypertension: Secondary | ICD-10-CM

## 2015-03-21 DIAGNOSIS — I429 Cardiomyopathy, unspecified: Secondary | ICD-10-CM

## 2015-03-21 DIAGNOSIS — I5022 Chronic systolic (congestive) heart failure: Secondary | ICD-10-CM

## 2015-03-21 DIAGNOSIS — I428 Other cardiomyopathies: Secondary | ICD-10-CM

## 2015-03-21 LAB — CUP PACEART INCLINIC DEVICE CHECK
Implantable Lead Implant Date: 20070712
Implantable Lead Implant Date: 20070712
Implantable Lead Implant Date: 20141229
Implantable Lead Location: 753858
Implantable Lead Location: 753860
Implantable Lead Model: 7020
Lead Channel Setting Pacing Pulse Width: 0.4 ms
Lead Channel Setting Pacing Pulse Width: 0.7 ms
Lead Channel Setting Sensing Sensitivity: 2 mV
MDC IDC LEAD LOCATION: 753859
MDC IDC PG MODEL: 3242
MDC IDC PG SERIAL: 7554613
MDC IDC SESS DTM: 20161221144715
MDC IDC SET LEADCHNL LV PACING AMPLITUDE: 2.75 V
MDC IDC SET LEADCHNL RA PACING AMPLITUDE: 2 V
MDC IDC SET LEADCHNL RV PACING AMPLITUDE: 2.5 V

## 2015-03-21 MED ORDER — CARVEDILOL 6.25 MG PO TABS: 6.2500 mg | ORAL_TABLET | Freq: Two times a day (BID) | ORAL | Status: DC

## 2015-03-21 NOTE — Patient Instructions (Addendum)
Medication Instructions:   START TAKING CARVEDILOL 6.25 MG TWICE A DAY    If you need a refill on your cardiac medications before your next appointment, please call your pharmacy.  Labwork:NONE ORDER TODAY   Testing/Procedures:  Your physician has requested that you have an echocardiogram. Echocardiography is a painless test that uses sound waves to create images of your heart. It provides your doctor with information about the size and shape of your heart and how well your heart's chambers and valves are working. This procedure takes approximately one hour. There are no restrictions for this procedure.   Follow-Up:  Your physician wants you to follow-up in:  [place] will receive a reminder letter in the mail two months in advance. If you don't receive a letter, please call our office to schedule the follow-up appointment.  Remote monitoring is used to monitor your Pacemaker of ICD from home. This monitoring reduces the number of office visits required to check your device to one time per year. It allows [place] to keep an eye on the functioning of your device to ensure it is working properly. You are scheduled for a device check from home on .06/19/15 You may send your transmission at any time that day. If you have a wireless device, the transmission will be sent automatically. After your physician reviews your transmission, you will receive a postcard with your next transmission date.      Any Other Special Instructions Will Be Listed Below (If Applicable).

## 2015-03-21 NOTE — Progress Notes (Signed)
Electrophysiology Office Note Date: 03/21/2015  ID:  Meradith, Beechy 11/23/1929, MRN [id number]  PCP: Chrisandra Netters, MD Electrophysiologist: Caryl Comes  CC: Pacemaker follow-up  [name] is a 81 y.o. female seen today for Dr Caryl Comes.  She presents today for routine electrophysiology followup.  Since last being seen in our clinic, the patient reports doing relatively well.  She denies chest pain, palpitations, dyspnea, PND, orthopnea, nausea, vomiting, dizziness, syncope, edema, weight gain, or early satiety.  Echo 08/2012 demonstrated EF [id number], grade 1 diastolic dysfunction, moderate MR  She remains active caring for her mentally retarded son.  Fatigue is her biggest complaint today.  Device History: STJ CRTD implanted 2007 for NICM; gen change 2012; LV lead revision and downgrade to CRTP 2014    Past Medical History  Diagnosis Date  . Nonischemic cardiomyopathy ([place])     Cath without coronary disease 2006  . Systolic CHF ([place])   . Hyperlipidemia   . Hypertension   . Osteoporosis   . Urge incontinence   . Raynaud phenomenon   . PAF (paroxysmal atrial fibrillation) ([facility])     Short bursts of PAF. Asymptomatic.  Sees Dr Rex Kras.  . Biventricular ICD (implantable cardioverter-defibrillator)-[employer]     Date of implant 2007 generator change 2012  . Sinus tachycardia ([place])   . Macro dislodgment of the left ventricular lead 09/09/2012   Past Surgical History  Procedure Laterality Date  . Appendectomy    . Biventricular icd implant  06/28/2010; 03/28/2013    STJ CRTD implanted 2012 with dislodgement of LV lead 2014; 03/28/2013 CRTP placed with new LV lead by Dr Caryl Comes  . Bladder tack    . Cardiac catheterization  03/2005    EF 25%, Normal arteries, Nonischemic CM  . Abdominal hysterectomy  1970s  . Suprapubic urethrovesicular suspension  1992  . Hip arthroplasty Left 12/20/2013    Procedure: LEFT HIP HEMI ARTHROPLASTY;  Surgeon: Meredith Pel, MD;  Location: [facility];   Service: Orthopedics;  Laterality: Left;  . Left and right heart catheterization with coronary angiogram N/A 12/13/2012    Procedure: LEFT AND RIGHT HEART CATHETERIZATION WITH CORONARY ANGIOGRAM;  Surgeon: Blane Ohara, MD;  Location: [facility] CATH LAB;  Service: Cardiovascular;  Laterality: N/A;  . Bi-ventricular pacemaker insertion N/A 03/28/2013    Procedure: BI-VENTRICULAR PACEMAKER INSERTION (CRT-P);  Surgeon: Deboraha Sprang, MD;  Location: [facility] C[facility] LAB;  Service: Cardiovascular;  Laterality: N/A;  . Total hip arthroplasty Right 09/04/2014    Procedure: BIPOLAR HIP ARTHROPLASTY ANTERIOR APPROACH;  Surgeon: Mcarthur Rossetti, MD;  Location: [facility];  Service: Orthopedics;  Laterality: Right;  . US echocardiography  11/18/2011    EF 40%, LA moderately dilated, mild AI  . Nm myocar perf wall motion  03/19/2010    EF 63%, low risk    Current Outpatient Prescriptions  Medication Sig Dispense Refill  . acetaminophen (TYLENOL) 650 MG CR tablet Take 1,300 mg by mouth 2 (two) times daily.    Marland Kitchen aspirin 81 MG tablet Take 81 mg by mouth daily.    . carvedilol (COREG) 3.125 MG tablet Take 1 tablet (3.125 mg total) by mouth 2 (two) times daily. 180 tablet 3  . Cranberry (SM CRANBERRY) 300 MG tablet Take 600 mg by mouth 2 (two) times daily.     . furosemide (LASIX) 40 MG tablet Take 40 mg by mouth. M/W/F     No current facility-administered medications for this visit.    Allergies:  Sulfamethoxazole   Social History: Social History   Social History  . Marital Status: Widowed    Spouse Name: N/A  . Number of Children: 4  . Years of Education: masters   Occupational History  . Retired- Chartered certified accountant   .     Social History Main Topics  . Smoking status: Former Smoker    Quit date: 04/22/2002  . Smokeless tobacco: Never Used  . 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  . Drug Use: No  . Sexual Activity: Not on file   Other Topics Concern  . Not on  file   Social History Narrative   Health Care POA: Oletta Lamas (h) [phone] (c) [phone]   [name] has copy of Living Will.   Emergency Contact: Oletta Lamas   End of Life Plan: DNR/DNI   Who lives with you: Lives with son who is handicapped   Any pets: bird, [place]   Diet: Patient has a varied diet and does not eat fish or a lot of meat.   Exercise:  gardens, and cleans home   Seatbelts: Patient reports wearing seat belt when in vehicle.   Sun Exposure/Protection: Patient denies wearing sun protection    Hobbies: Reading, shopping, gardening, visiting family      Widowed 4 children- son Delfino Lovett lives with her (mild MR).       Retired, formerly was Buyer, retail.  Recovered alcoholic,  Quit [id number]. Quit Smoking 2004.    Family History: Family History  Problem Relation Age of Onset  . Heart disease Mother   . Heart disease Father   . Cancer Father     Kidney  . Heart disease Brother   . Diabetes Son      Review of Systems: All other systems reviewed and are otherwise negative except as noted above.   Physical Exam: VS:  BP 138/90 mmHg  Pulse 85  Ht 5\' 5"  (1.651 m)  Wt 115 lb 6.4 oz (52.345 kg)  BMI 19.20 kg/m2 , BMI Body mass index is 19.2 kg/(m^2).  GEN- The patient is elderly appearing, alert and oriented x 3 today.   HEENT: normocephalic, atraumatic; sclera clear, conjunctiva pink; hearing intact; oropharynx clear; neck supple Lungs- Clear to ausculation bilaterally, normal work of breathing.  No wheezes, rales, rhonchi Heart- Regular rate and rhythm (paced) GI- soft, non-tender, non-distended, bowel sounds present Extremities- no clubbing, cyanosis, 1+ BLE edema MS- no significant deformity or atrophy Skin- warm and dry, no rash or lesion; PPM pocket well healed Psych- euthymic mood, full affect Neuro- strength and sensation are intact  PPM Interrogation- reviewed in detail today,  See PACEART report  EKG:  EKG is not ordered today. The ekg  ordered today shows sinus rhythm, V pacing  Recent Labs: 09/04/2014: ALT 17 09/06/2014: BUN 8; Creatinine, Ser 0.59; Hemoglobin 10.4*; Platelets 111*; Potassium 3.8; Sodium 129*   Wt Readings from Last 3 Encounters:  03/21/15 115 lb 6.4 oz (52.345 kg)  02/20/15 112 lb 9.6 oz (51.075 kg)  02/05/15 111 lb 9.6 oz (50.621 kg)     Other studies Reviewed: Additional studies/ records that were reviewed today include: Dr Olin Pia office notes  Assessment and Plan:  1.  Chronic systolic heart failure Euvolemic on exam Normal PPM function See Pace Art report No changes today Update echo Increase Coreg to 6.25mg  bid today Consider uptitrating further at next OV with Dr Caryl Comes  2.  Moderate MR Update echo  3.  HTN BP elevated today Increase Coreg as above  4.  Sinus tach Histograms are right shifted Increase Coreg as above.   Current medicines are reviewed at length with the patient today.   The patient does not have concerns regarding her medicines.  The following changes were made today:  Increase Coreg to 6.25mg  twice daily  Labs/ tests ordered today include: echo   Disposition:   Follow up with [place] transmissions, Dr Caryl Comes in 6 months    Signed, Chanetta Marshall, NP 03/21/2015 2:41 PM  [facility] [address] [place] [address] [phone] (office) [phone] (fax)

## 2015-04-03 ENCOUNTER — Ambulatory Visit (HOSPITAL_COMMUNITY): Payer: Commercial Managed Care - HMO | Attending: Cardiovascular Disease

## 2015-04-03 ENCOUNTER — Other Ambulatory Visit: Payer: Self-pay

## 2015-04-03 DIAGNOSIS — I517 Cardiomegaly: Secondary | ICD-10-CM | POA: Insufficient documentation

## 2015-04-03 DIAGNOSIS — Z87891 Personal history of nicotine dependence: Secondary | ICD-10-CM | POA: Insufficient documentation

## 2015-04-03 DIAGNOSIS — I351 Nonrheumatic aortic (valve) insufficiency: Secondary | ICD-10-CM | POA: Insufficient documentation

## 2015-04-03 DIAGNOSIS — I340 Nonrheumatic mitral (valve) insufficiency: Secondary | ICD-10-CM | POA: Diagnosis not present

## 2015-04-03 DIAGNOSIS — I429 Cardiomyopathy, unspecified: Secondary | ICD-10-CM | POA: Diagnosis not present

## 2015-04-03 DIAGNOSIS — I428 Other cardiomyopathies: Secondary | ICD-10-CM

## 2015-04-03 DIAGNOSIS — E785 Hyperlipidemia, unspecified: Secondary | ICD-10-CM | POA: Diagnosis not present

## 2015-04-03 DIAGNOSIS — I5022 Chronic systolic (congestive) heart failure: Secondary | ICD-10-CM | POA: Diagnosis not present

## 2015-04-05 ENCOUNTER — Telehealth: Payer: Self-pay | Admitting: *Deleted

## 2015-04-05 NOTE — Telephone Encounter (Signed)
-----   Message from Regina Berthold, NP sent at 04/03/2015  8:43 AM EST ----- Please notify patient of results. No significant change since last echo. Thanks

## 2015-04-24 ENCOUNTER — Telehealth: Payer: Self-pay | Admitting: *Deleted

## 2015-04-24 NOTE — Telephone Encounter (Signed)
Called patient to offer flu vaccine. Patient states she and her son, Toriah Liverpool both received the flu vaccine at the [facility], [place], on approximately 10/15/20162016. Added to historical immunization record. Velora Heckler, RN

## 2015-05-10 ENCOUNTER — Ambulatory Visit (INDEPENDENT_AMBULATORY_CARE_PROVIDER_SITE_OTHER): Payer: Commercial Managed Care - HMO | Admitting: Family Medicine

## 2015-05-10 ENCOUNTER — Encounter: Payer: Self-pay | Admitting: Family Medicine

## 2015-05-10 VITALS — BP 127/65 | HR 92 | Temp 98.3°F | Ht 65.0 in | Wt 114.0 lb

## 2015-05-10 DIAGNOSIS — N3941 Urge incontinence: Secondary | ICD-10-CM | POA: Diagnosis not present

## 2015-05-10 DIAGNOSIS — R5383 Other fatigue: Secondary | ICD-10-CM | POA: Diagnosis not present

## 2015-05-10 DIAGNOSIS — M818 Other osteoporosis without current pathological fracture: Secondary | ICD-10-CM | POA: Diagnosis not present

## 2015-05-10 LAB — CBC WITH DIFFERENTIAL/PLATELET
BASOS ABS: 0.1 10*3/uL (ref 0.0–0.1)
BASOS PCT: 1 % (ref 0–1)
EOS ABS: 0.2 10*3/uL (ref 0.0–0.7)
Eosinophils Relative: 3 % (ref 0–5)
HCT: 41.3 % (ref 36.0–46.0)
Hemoglobin: 13.6 g/dL (ref 12.0–15.0)
LYMPHS ABS: 2.1 10*3/uL (ref 0.7–4.0)
Lymphocytes Relative: 30 % (ref 12–46)
MCH: 30.6 pg (ref 26.0–34.0)
MCHC: 32.9 g/dL (ref 30.0–36.0)
MCV: 93 fL (ref 78.0–100.0)
MPV: 11.4 fL (ref 8.6–12.4)
Monocytes Absolute: 0.8 10*3/uL (ref 0.1–1.0)
Monocytes Relative: 12 % (ref 3–12)
NEUTROS PCT: 54 % (ref 43–77)
Neutro Abs: 3.8 10*3/uL (ref 1.7–7.7)
PLATELETS: 153 10*3/uL (ref 150–400)
RBC: 4.44 MIL/uL (ref 3.87–5.11)
RDW: 14 % (ref 11.5–15.5)
WBC: 7 10*3/uL (ref 4.0–10.5)

## 2015-05-10 LAB — TSH: TSH: 1.28 m[IU]/L

## 2015-05-10 NOTE — Patient Instructions (Signed)
Checking labs today I'll call you when I talk with Dr. Caryl Comes about incontinence medicine Call us if you haven't heard from me in 2 weeks  Be well, Dr. [name]

## 2015-05-11 LAB — COMPREHENSIVE METABOLIC PANEL
ALBUMIN: 4 g/dL (ref 3.6–5.1)
ALK PHOS: 77 U/L (ref 33–130)
ALT: 12 U/L (ref 6–29)
AST: 20 U/L (ref 10–35)
BILIRUBIN TOTAL: 0.5 mg/dL (ref 0.2–1.2)
BUN: 21 mg/dL (ref 7–25)
CALCIUM: 9.6 mg/dL (ref 8.6–10.4)
CO2: 25 mmol/L (ref 20–31)
Chloride: 101 mmol/L (ref 98–110)
Creat: 0.73 mg/dL (ref 0.60–0.88)
Glucose, Bld: 108 mg/dL — ABNORMAL HIGH (ref 65–99)
POTASSIUM: 4.5 mmol/L (ref 3.5–5.3)
Sodium: 141 mmol/L (ref 135–146)
Total Protein: 6.8 g/dL (ref 6.1–8.1)

## 2015-05-11 LAB — VITAMIN D 25 HYDROXY (VIT D DEFICIENCY, FRACTURES): VIT D 25 HYDROXY: 28 ng/mL — AB (ref 30–100)

## 2015-05-13 NOTE — Progress Notes (Signed)
Date of Visit: 05/10/2015   HPI:  Patient presents to discuss fatigue and urinary incontinence.  Fatigue - has felt quite fatigued for the last 6 or so month. Recently had carvedilol increased at cardiologist appointment, but reports fatigue preceded that medication change. Denies having fever, cough, pains, or shortness of breath. Just feels generally tired. Has never slept well in the last 20+ years, but this is unchanged.   Urinary incontinence - has urge incontinence frequently. Wears diaper. Can't get to bathroom fast enough and leaks. Has had a bladder tack surgery in the past without great results. Had 4 prior vaginal deliveries. Interested in medication to help with incontinence.   ROS: See HPI.  [place]: history of osteoporosis, bilateral prior hip fractures, nonischemic cardiomyopathy, chronic systolic CHF, hyperlipidemia, hypertension, biventricular ICD in place, urge incontinence  PHYSICAL EXAM: BP 127/65 mmHg  Pulse 92  Temp(Src) 98.3 F (36.8 C) (Oral)  Ht 5\' 5"  (1.651 m)  Wt 114 lb (51.71 kg)  BMI 18.97 kg/m2 Gen: NAD, pleasant, cooperative HEENT: normocephalic, atraumatic. No anterior cervical or supraclavicular lymphadenopathy. Oropharynx clear and moist Heart: regular rate and rhythm, no murmur Lungs: clear to auscultation bilaterally, normal work of breathing  Abdomen: soft, nontender to palpation, no masses or organomegaly Neuro: alert, grossly nonfocal, speech normal Ext: atraumatic  ASSESSMENT/PLAN:  INCONTINENCE, URGE Discussed options with patient for medical tx of overactive bladder/urge incontinence. Patient is not interested in seeing a urologist. The issue is she is already feeling fatigued, and has previously broken both hips from low impact falls. I'm concerned about putting her on medications with anticholinergic activity due to the risk of adverse effects. Also, her cardiac history must be taken into account. Will plan to speak with pharmacist Dr. Valentina Lucks  as well as contact her cardiologist Dr. Caryl Comes to inquire whether they would recommend oxybutynin vs myrbetriq vs some other medication.  Fatigue No obvious cause on history or exam. Will check basic labs today, including cmet, cbc diff, vit D, and TSH.   FOLLOW UP: Will discuss medication recommendations by phone within next couple of weeks.  [place]. [name], [place]

## 2015-05-14 NOTE — Assessment & Plan Note (Signed)
No obvious cause on history or exam. Will check basic labs today, including cmet, cbc diff, vit D, and TSH.

## 2015-05-14 NOTE — Assessment & Plan Note (Signed)
Discussed options with patient for medical tx of overactive bladder/urge incontinence. Patient is not interested in seeing a urologist. The issue is she is already feeling fatigued, and has previously broken both hips from low impact falls. I'm concerned about putting her on medications with anticholinergic activity due to the risk of adverse effects. Also, her cardiac history must be taken into account. Will plan to speak with pharmacist Dr. Valentina Lucks as well as contact her cardiologist Dr. Caryl Comes to inquire whether they would recommend oxybutynin vs myrbetriq vs some other medication.

## 2015-05-16 ENCOUNTER — Other Ambulatory Visit: Payer: Self-pay

## 2015-05-16 MED ORDER — FUROSEMIDE 40 MG PO TABS: ORAL_TABLET | ORAL | Status: DC

## 2015-05-18 NOTE — Progress Notes (Signed)
Addendum for billing purposes Vit D also obtained for ICD 10 dx code of M81.8 (other osteoporosis)

## 2015-05-29 ENCOUNTER — Telehealth: Payer: Self-pay | Admitting: Family Medicine

## 2015-05-29 MED ORDER — SOLIFENACIN SUCCINATE 5 MG PO TABS: 5.0000 mg | ORAL_TABLET | Freq: Every day | ORAL | Status: DC

## 2015-05-29 NOTE — Telephone Encounter (Signed)
Called patient back & discussed labs & urinary medication  Labs - glucose mildly elevated. Recommend check A1c. Vit D slightly low. She takes 1000IU of vit D3 daily now. Recommend taking 2000IU daily for the next several weeks  Regarding urinary medication - let her know that I talked w/ Dr. Valentina Lucks about recommended overactive bladder medication given her history of heart failure and osteoporosis with prior fractures. He recommended Vesicare. Discussed risks & benefits of this medication. She is to monitor closely for any signs of dizziness/lightheadedness/wobbliness and will call if she has any symptoms. Want to really avoid any falls in her, as she has proven herself to be quite susceptible to fractures. Patient understands and accepts these risks, and will be very cautious about monitoring for side effects.  Rx sent in. Appointment scheduled to follow up with me in 1 month. Patient appreciative.  [name], MD

## 2015-05-29 NOTE — Telephone Encounter (Signed)
Please call Mrs. Dutson back regarding discussion with Dr. Caryl Comes

## 2015-05-31 ENCOUNTER — Telehealth: Payer: Self-pay | Admitting: *Deleted

## 2015-05-31 NOTE — Telephone Encounter (Signed)
Claiborne Billings from [facility] called stating that patient's insurance is charging patient [phone] for [employer].  Request to change to oxybutynin.  Please call [phone]. Derl Barrow, RN

## 2015-06-01 NOTE — Telephone Encounter (Signed)
Patient calls, states she will not be taking medication due to cost. Unsure if she will still be interested in taking another med if cheaper.

## 2015-06-13 ENCOUNTER — Other Ambulatory Visit: Payer: Self-pay | Admitting: *Deleted

## 2015-06-18 ENCOUNTER — Telehealth: Payer: Self-pay | Admitting: Internal Medicine

## 2015-06-18 NOTE — Telephone Encounter (Signed)
Verbal instructions given on how to send a manual transmission since appt was not made in [place]. Patient voiced understanding.

## 2015-06-18 NOTE — Telephone Encounter (Signed)
New message    Patient calling has a remote schedule on tomorrow needs instruction , does not have phone # .

## 2015-06-19 ENCOUNTER — Ambulatory Visit (INDEPENDENT_AMBULATORY_CARE_PROVIDER_SITE_OTHER): Payer: Commercial Managed Care - HMO | Admitting: *Deleted

## 2015-06-19 DIAGNOSIS — I429 Cardiomyopathy, unspecified: Secondary | ICD-10-CM | POA: Diagnosis not present

## 2015-06-19 DIAGNOSIS — I428 Other cardiomyopathies: Secondary | ICD-10-CM

## 2015-06-20 NOTE — Progress Notes (Signed)
Remote pacemaker transmission.   

## 2015-06-22 IMAGING — CR DG PORTABLE PELVIS
1 series · 1 of 1 positions shown · non-contrast
Comparison: 12/19/2013

CLINICAL DATA: Postop left hip replacement

EXAM:
PORTABLE PELVIS 1-2 VIEWS

[AP]
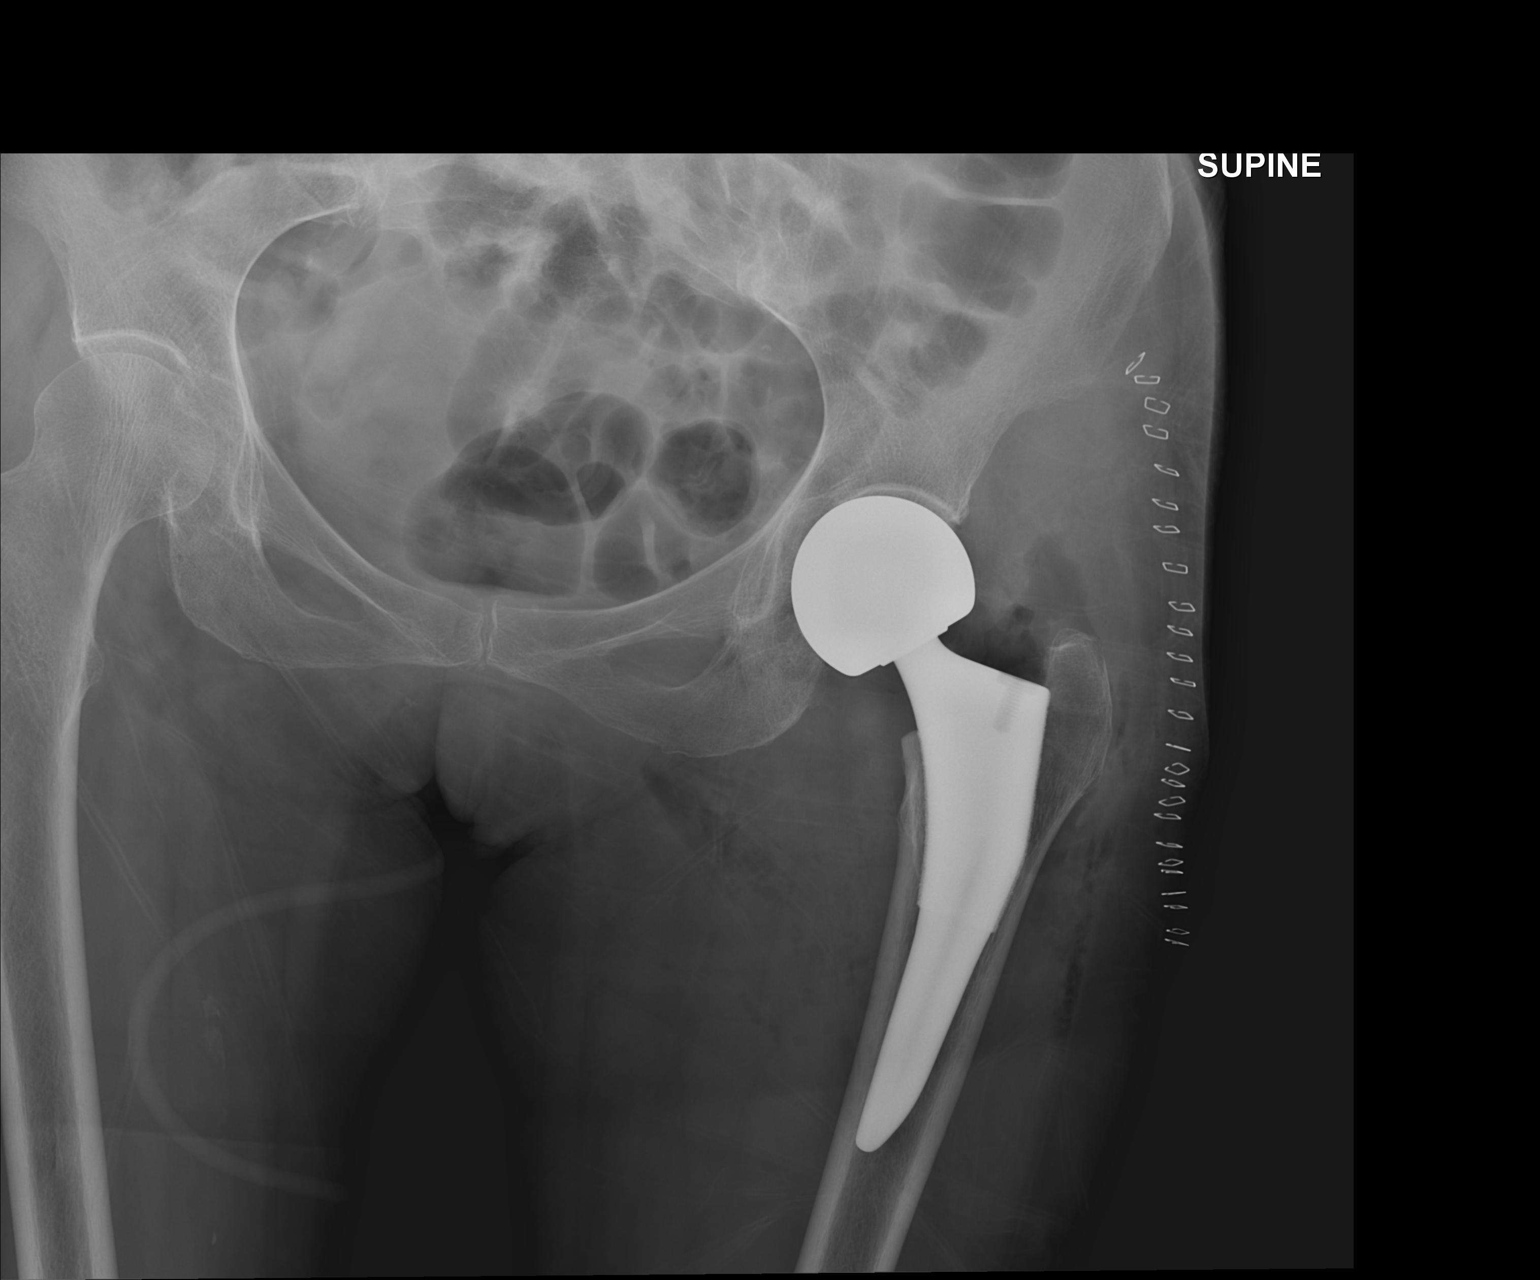

[1 of 1 positions shown; findings below may reference images not displayed]

FINDINGS: Unipolar left hip prosthesis with anticipated postsurgical change
surrounding the joint and component in anticipated position.
IMPRESSION: Postoperative change

## 2015-06-26 ENCOUNTER — Ambulatory Visit (INDEPENDENT_AMBULATORY_CARE_PROVIDER_SITE_OTHER): Payer: Commercial Managed Care - HMO | Admitting: Family Medicine

## 2015-06-26 ENCOUNTER — Encounter: Payer: Self-pay | Admitting: Family Medicine

## 2015-06-26 VITALS — BP 137/53 | HR 82 | Temp 97.9°F | Ht 65.0 in | Wt 116.0 lb

## 2015-06-26 DIAGNOSIS — I10 Essential (primary) hypertension: Secondary | ICD-10-CM | POA: Diagnosis not present

## 2015-06-26 DIAGNOSIS — R739 Hyperglycemia, unspecified: Secondary | ICD-10-CM

## 2015-06-26 DIAGNOSIS — N3941 Urge incontinence: Secondary | ICD-10-CM | POA: Diagnosis not present

## 2015-06-26 DIAGNOSIS — M810 Age-related osteoporosis without current pathological fracture: Secondary | ICD-10-CM | POA: Diagnosis not present

## 2015-06-26 LAB — POCT GLYCOSYLATED HEMOGLOBIN (HGB A1C): HEMOGLOBIN A1C: 6

## 2015-06-26 MED ORDER — ALENDRONATE SODIUM 70 MG PO TABS: 70.0000 mg | ORAL_TABLET | ORAL | Status: DC

## 2015-06-26 MED ORDER — TOLTERODINE TARTRATE 1 MG PO TABS: 1.0000 mg | ORAL_TABLET | Freq: Two times a day (BID) | ORAL | Status: DC

## 2015-06-26 MED ORDER — CARVEDILOL 3.125 MG PO TABS: 3.1250 mg | ORAL_TABLET | Freq: Two times a day (BID) | ORAL | Status: DC

## 2015-06-26 NOTE — Progress Notes (Signed)
Date of Visit: 06/26/2015   HPI:  Patient presents for follow up.  Still notes lots of fatigue. At recent cardiology visit, her coreg was increased to 6.25mg  twice daily. Patient has been compliant with this but is reporting issues with dizziness and fatigue, feeling like blood pressure is getting too low. Wants to go back down to 3.125mg  twice daily. No syncope.  Urinary incontinence - was not able to afford vesicare. Willing to try something else. If it's very expensive she won't want it, would rather just wear depends.  Osteoporosis - s/p bilateral hip fractures. Is not taking bisphosphonate but after discussion today is willing.  ROS: See HPI.  [place]: history of atrial fibrillation, ICD in place, chronic systolic heart failure, hyperlipidemia, hypertension, NICM, osteoporosis  PHYSICAL EXAM: BP 137/53 mmHg  Pulse 82  Temp(Src) 97.9 F (36.6 C) (Oral)  Ht 5\' 5"  (1.651 m)  Wt 116 lb (52.617 kg)  BMI 19.30 kg/m2 Gen: NAD, pleasant, cooperative HEENT: normocephalic, atraumatic, moist mucous membranes  Heart: regular rate and rhythm, no murmur Lungs: clear to auscultation bilaterally, normal work of breathing  Neuro: alert, grossly nonfocal, speech normal Ext: Mild appreciable lower extremity edema bilaterally   ASSESSMENT/PLAN:  HYPERTENSION, BENIGN SYSTEMIC Patient concerned blood pressure has been getting too low on new dose of carvedilol Will decrease back to 3.125mg  twice daily  I will message Dr. Caryl Comes, her cardiologist, so he is aware we are making this change.  Follow up with me in 1 month   Osteoporosis rediscussed taking alendronate today and the potential for benefit Patient willing to give it a try rx sent in   [place] Could not afford vesicare. Discussed with Dr. Valentina Lucks again today, regarding recommended agent for ovearctive bladder in this patient who is at high risk for another fracture if she falls due to anticholinergic medications. He  recommended Detrol due to its relative inexpense and lower ability to cross blood-brain barrier Will cautiously prescribe this - with strict precautions about confusion, dizziness, falls. If she notices any of this with this medication she is to stop it and call me.  Hyperglycemia - recent labs with elevated glucose. Check A1c today.  FOLLOW UP: Follow up in 1 month for above issues  [place] J. [name], [place]

## 2015-06-26 NOTE — Patient Instructions (Signed)
Sent in detrol for you (bladder medicine). Use caution if it makes you lightheaded, dizzy, or weak. If it does these things stop taking and call me.  For osteoporosis, start alendronate once a week. Sent this in.  For blood pressure, decrease back to 3.125mg  twice a day. Sent this in. I'll message Dr. Caryl Comes to let him know we made this change.  Be well, Dr. [name]

## 2015-06-28 NOTE — Assessment & Plan Note (Signed)
Could not afford vesicare. Discussed with Dr. Valentina Lucks again today, regarding recommended agent for ovearctive bladder in this patient who is at high risk for another fracture if she falls due to anticholinergic medications. He recommended Detrol due to its relative inexpense and lower ability to cross blood-brain barrier Will cautiously prescribe this - with strict precautions about confusion, dizziness, falls. If she notices any of this with this medication she is to stop it and call me.

## 2015-06-28 NOTE — Assessment & Plan Note (Signed)
Patient concerned blood pressure has been getting too low on new dose of carvedilol Will decrease back to 3.125mg  twice daily  I will message Dr. Caryl Comes, her cardiologist, so he is aware we are making this change.  Follow up with me in 1 month

## 2015-06-28 NOTE — Assessment & Plan Note (Signed)
rediscussed taking alendronate today and the potential for benefit Patient willing to give it a try rx sent in

## 2015-07-06 ENCOUNTER — Encounter: Payer: Self-pay | Admitting: Internal Medicine

## 2015-07-06 ENCOUNTER — Telehealth: Payer: Self-pay | Admitting: Internal Medicine

## 2015-07-06 ENCOUNTER — Encounter: Payer: Self-pay | Admitting: Cardiology

## 2015-07-06 NOTE — Telephone Encounter (Signed)
Pt would like the results of her transmission from 06-19-15 pls call [phone]

## 2015-07-06 NOTE — Telephone Encounter (Signed)
Spoke w/ pt and informed her that we did receive her remote transmission on 06-19-15. Pt verbalized understanding.

## 2015-07-31 LAB — CUP PACEART REMOTE DEVICE CHECK
Battery Remaining Longevity: 85 mo
Battery Remaining Percentage: 95.5 %
Brady Statistic AP VP Percent: 1 %
Brady Statistic AS VS Percent: 1.1 %
Implantable Lead Implant Date: 20070712
Implantable Lead Implant Date: 20070712
Implantable Lead Implant Date: 20141229
Implantable Lead Location: 753858
Implantable Lead Location: 753860
Implantable Lead Model: 7020
Lead Channel Impedance Value: 490 Ohm
Lead Channel Impedance Value: 510 Ohm
Lead Channel Pacing Threshold Amplitude: 1.125 V
Lead Channel Sensing Intrinsic Amplitude: 12 mV
Lead Channel Setting Pacing Amplitude: 2.125
Lead Channel Setting Sensing Sensitivity: 2 mV
MDC IDC LEAD LOCATION: 753859
MDC IDC MSMT BATTERY VOLTAGE: 2.98 V
MDC IDC MSMT LEADCHNL LV PACING THRESHOLD PULSEWIDTH: 0.7 ms
MDC IDC MSMT LEADCHNL RA IMPEDANCE VALUE: 360 Ohm
MDC IDC MSMT LEADCHNL RA SENSING INTR AMPL: 4.1 mV
MDC IDC SESS DTM: 20170321123723
MDC IDC SET LEADCHNL LV PACING PULSEWIDTH: 0.7 ms
MDC IDC SET LEADCHNL RA PACING AMPLITUDE: 2 V
MDC IDC SET LEADCHNL RV PACING AMPLITUDE: 2.5 V
MDC IDC SET LEADCHNL RV PACING PULSEWIDTH: 0.4 ms
MDC IDC STAT BRADY AP VS PERCENT: 1 %
MDC IDC STAT BRADY AS VP PERCENT: 97 %
MDC IDC STAT BRADY RA PERCENT PACED: 1 %
Pulse Gen Model: 3242
Pulse Gen Serial Number: 7554613

## 2015-08-08 ENCOUNTER — Encounter: Payer: Self-pay | Admitting: Cardiology

## 2015-09-17 NOTE — Progress Notes (Signed)
Patient Care Team: [name], MD as PCP - General (Pediatrics) Deboraha Sprang, MD as Consulting Physician (Cardiology)   HPI  [name] is a 82 y.o. female  Seen in followup for CHF with previous CRT with LV lead dislodgement confirmed by CXR  in December she underwent downgrade of her device for ICD-->>   this is pacemaker insertion of a new LV lead.  Efforts for afterload reduction complicated by hypotension.  She underwent cath for the above and chest pain>>LV dysfunction mod 35-40% with [place] MR  Echo 1/17 EF 20-25%  Her biggest complaint is fatigue. She sleeps poorly. She has daytime somnolence.  She has moderate edema. She does not correlate her mind closely edema with dyspnea and/or her fatigue.  She s otherwise stable        Past Medical History  Diagnosis Date  . Nonischemic cardiomyopathy ([place])     Cath without coronary disease 2006  . Systolic CHF ([place])   . Hyperlipidemia   . Hypertension   . Osteoporosis   . Urge incontinence   . Raynaud phenomenon   . PAF (paroxysmal atrial fibrillation) ([facility])     Short bursts of PAF. Asymptomatic.  Sees Dr Rex Kras.  . Biventricular ICD (implantable cardioverter-defibrillator)-[employer]     Date of implant 2007 generator change 2012  . Sinus tachycardia ([place])   . Macro dislodgment of the left ventricular lead 09/09/2012    Past Surgical History  Procedure Laterality Date  . Appendectomy    . Biventricular icd implant  06/28/2010; 03/28/2013    STJ CRTD implanted 2012 with dislodgement of LV lead 2014; 03/28/2013 CRTP placed with new LV lead by Dr Caryl Comes  . Bladder tack    . Cardiac catheterization  03/2005    EF 25%, Normal arteries, Nonischemic CM  . Abdominal hysterectomy  1970s  . Suprapubic urethrovesicular suspension  1992  . Hip arthroplasty Left 12/20/2013    Procedure: LEFT HIP HEMI ARTHROPLASTY;  Surgeon: Meredith Pel, MD;  Location: [place] [facility];  Service: Orthopedics;  Laterality: Left;  .  Left and right heart catheterization with coronary angiogram N/A 12/13/2012    Procedure: LEFT AND RIGHT HEART CATHETERIZATION WITH CORONARY ANGIOGRAM;  Surgeon: Blane Ohara, MD;  Location: [facility] CATH LAB;  Service: Cardiovascular;  Laterality: N/A;  . Bi-ventricular pacemaker insertion N/A 03/28/2013    Procedure: BI-VENTRICULAR PACEMAKER INSERTION (CRT-P);  Surgeon: Deboraha Sprang, MD;  Location: [facility] CATH LAB;  Service: Cardiovascular;  Laterality: N/A;  . Total hip arthroplasty Right 09/04/2014    Procedure: BIPOLAR HIP ARTHROPLASTY ANTERIOR APPROACH;  Surgeon: Mcarthur Rossetti, MD;  Location: [place];  Service: Orthopedics;  Laterality: Right;  . US echocardiography  11/18/2011    EF 40%, LA moderately dilated, mild AI  . Nm myocar perf wall motion  03/19/2010    EF 63%, low risk    Current Outpatient Prescriptions  Medication Sig Dispense Refill  . acetaminophen (TYLENOL) 650 MG CR tablet Take 650 mg by mouth 3 (three) times a week.     Marland Kitchen aspirin 81 MG tablet Take 81 mg by mouth daily.    . carvedilol (COREG) 3.125 MG tablet Take 1 tablet (3.125 mg total) by mouth 2 (two) times daily with a meal. 60 tablet 3  . Cranberry (SM CRANBERRY) 300 MG tablet Take 600 mg by mouth 2 (two) times daily.     . furosemide (LASIX) 40 MG tablet Take M/W/F 30 tablet 1  No current facility-administered medications for this visit.    Allergies  Allergen Reactions  . Sulfamethoxazole     REACTION: unspecified    Review of Systems negative except from HPI and PMH  Physical Exam BP 132/60 mmHg  Pulse 90  Ht 5\' 5"  (1.651 m)  Wt 115 lb 9.6 oz (52.436 kg)  BMI 19.24 kg/m2  SpO2 98%  Well developed and nourished in no acute distress HENT normal Neck supple with JVP-8-9 Clear Regular but rapid rate and rhythm, no murmurs or gallops Abd-soft with active BS No Clubbing cyanosis 2+ aymmmetiricedema Skin-warm and dry A & Oriented  Grossly normal sensory and motor function  ECG demonstrates  sinus rhythm with PACs versus pacing at a rate of 104   Assessment and plan  Nonischemic cardiomyopathy  Congestive heart failure-chronic systolic  Implantable device-CRT>>> defibrillator-->pacemaker The patient's device was interrogated and the information was fully reviewed.  The device was reprogrammed to increase the max tracking rate  Sinus tachycardia  Fatigue/daytime somnolence  I will have her follow-up with her PCP regarding the possibility of sleep study   There is evidence of volume overload. We will undertake a 5 day furosemide trial. We will try to correlate symptoms of fatigue and dyspnea with her edema.  I would like to try to get her on Entresto; her blood pressures have mostly been pretty good although she says at home sometimes her blood pressures are low. We will thus recorded blood pressures for 1 week and for averages about 110 we will start her on Entresto 24/26 the following week. I'll have her come in to see one of the PAs about 10 days after that.

## 2015-09-18 ENCOUNTER — Encounter: Payer: Self-pay | Admitting: Internal Medicine

## 2015-09-18 ENCOUNTER — Ambulatory Visit (INDEPENDENT_AMBULATORY_CARE_PROVIDER_SITE_OTHER): Payer: Commercial Managed Care - HMO | Admitting: Internal Medicine

## 2015-09-18 VITALS — BP 132/60 | HR 90 | Ht 65.0 in | Wt 115.6 lb

## 2015-09-18 DIAGNOSIS — I5022 Chronic systolic (congestive) heart failure: Secondary | ICD-10-CM | POA: Diagnosis not present

## 2015-09-18 DIAGNOSIS — I429 Cardiomyopathy, unspecified: Secondary | ICD-10-CM | POA: Diagnosis not present

## 2015-09-18 DIAGNOSIS — I428 Other cardiomyopathies: Secondary | ICD-10-CM

## 2015-09-18 LAB — CUP PACEART INCLINIC DEVICE CHECK
Battery Remaining Longevity: 79.2
Brady Statistic RA Percent Paced: 0.02 %
Brady Statistic RV Percent Paced: 97 %
Implantable Lead Implant Date: 20070712
Implantable Lead Location: 753858
Implantable Lead Location: 753859
Implantable Lead Location: 753860
Implantable Lead Model: 7020
Lead Channel Impedance Value: 362.5 Ohm
Lead Channel Impedance Value: 487.5 Ohm
Lead Channel Impedance Value: 487.5 Ohm
Lead Channel Pacing Threshold Amplitude: 0.5 V
Lead Channel Pacing Threshold Amplitude: 0.75 V
Lead Channel Pacing Threshold Pulse Width: 0.4 ms
Lead Channel Sensing Intrinsic Amplitude: 11.4 mV
Lead Channel Sensing Intrinsic Amplitude: 3.7 mV
Lead Channel Setting Pacing Amplitude: 2.375
Lead Channel Setting Pacing Amplitude: 2.5 V
Lead Channel Setting Pacing Pulse Width: 0.7 ms
Lead Channel Setting Sensing Sensitivity: 2 mV
MDC IDC LEAD IMPLANT DT: 20070712
MDC IDC LEAD IMPLANT DT: 20141229
MDC IDC MSMT BATTERY VOLTAGE: 2.98 V
MDC IDC MSMT LEADCHNL LV PACING THRESHOLD PULSEWIDTH: 0.7 ms
MDC IDC MSMT LEADCHNL RA PACING THRESHOLD AMPLITUDE: 0.5 V
MDC IDC MSMT LEADCHNL RA PACING THRESHOLD PULSEWIDTH: 0.4 ms
MDC IDC PG MODEL: 3242
MDC IDC PG SERIAL: 7554613
MDC IDC SESS DTM: 20170620092334
MDC IDC SET LEADCHNL RA PACING AMPLITUDE: 2 V
MDC IDC SET LEADCHNL RV PACING PULSEWIDTH: 0.4 ms

## 2015-09-18 NOTE — Patient Instructions (Signed)
Medication Instructions: - Your physician has recommended you make the following change in your medication:  1) Take lasix (furosemide) 40 mg once daily x 5 days, then resume normal dosing  ** Take your blood pressure over the next week, if the top number is > 110, then start Entresto 24/26 mg one tablet by mouth twice daily  Labwork: - none  Procedures/Testing: - none  Follow-Up: - Your physician recommends that you schedule a follow-up appointment in: 3 weeks with the PA/ NP  - Remote monitoring is used to monitor your Pacemaker of ICD from home. This monitoring reduces the number of office visits required to check your device to one time per year. It allows [place] to keep an eye on the functioning of your device to ensure it is working properly. You are scheduled for a device check from home on 12/18/15 You may send your transmission at any time that day. If you have a wireless device, the transmission will be sent automatically. After your physician reviews your transmission, you will receive a postcard with your next transmission date.  - Your physician wants you to follow-up in: 1 year with Dr. Caryl Comes. You will receive a reminder letter in the mail two months in advance. If you don't receive a letter, please call our office to schedule the follow-up appointment. nder letter in the mail two months in advance. If you don't receive a letter, please call our office to schedule the follow-up appointment.    Any Additional Special Instructions Will Be Listed Below (If Applicable).     If you need a refill on your cardiac medications before your next appointment, please call your pharmacy.

## 2015-09-19 ENCOUNTER — Telehealth: Payer: Self-pay | Admitting: *Deleted

## 2015-09-19 NOTE — Telephone Encounter (Signed)
Prior Authorization received from [facility] for [place]. Formulary and PA form placed in provider box for completion. Derl Barrow, RN

## 2015-09-24 NOTE — Telephone Encounter (Signed)
This needs to come from her cardiologist Dr. Caryl Comes as he is the one managing & prescribing the medication. Please contact pharmacy & ask them to send prior auth to cardiology office.  [name], MD

## 2015-09-25 NOTE — Telephone Encounter (Signed)
Called pharmacy and they said they didn't see any documentation of them sending a prior authorization. Deseree Kennon Holter, CMA

## 2015-10-06 ENCOUNTER — Emergency Department (HOSPITAL_COMMUNITY)
Admission: EM | Admit: 2015-10-06 | Discharge: 2015-10-06 | Disposition: A | Discharge status: Home / Self Care | Payer: Commercial Managed Care - HMO | Attending: Emergency Medicine | Admitting: Emergency Medicine

## 2015-10-06 ENCOUNTER — Other Ambulatory Visit: Payer: Self-pay | Admitting: Physician Assistant

## 2015-10-06 ENCOUNTER — Encounter (HOSPITAL_COMMUNITY): Payer: Self-pay | Admitting: Emergency Medicine

## 2015-10-06 DIAGNOSIS — Z96642 Presence of left artificial hip joint: Secondary | ICD-10-CM | POA: Insufficient documentation

## 2015-10-06 DIAGNOSIS — I959 Hypotension, unspecified: Secondary | ICD-10-CM | POA: Diagnosis not present

## 2015-10-06 DIAGNOSIS — I5020 Unspecified systolic (congestive) heart failure: Secondary | ICD-10-CM | POA: Insufficient documentation

## 2015-10-06 DIAGNOSIS — I9589 Other hypotension: Secondary | ICD-10-CM | POA: Insufficient documentation

## 2015-10-06 DIAGNOSIS — R404 Transient alteration of awareness: Secondary | ICD-10-CM | POA: Diagnosis not present

## 2015-10-06 DIAGNOSIS — Z7982 Long term (current) use of aspirin: Secondary | ICD-10-CM | POA: Insufficient documentation

## 2015-10-06 DIAGNOSIS — Z8679 Personal history of other diseases of the circulatory system: Secondary | ICD-10-CM | POA: Insufficient documentation

## 2015-10-06 DIAGNOSIS — Z950 Presence of cardiac pacemaker: Secondary | ICD-10-CM | POA: Insufficient documentation

## 2015-10-06 DIAGNOSIS — Z87891 Personal history of nicotine dependence: Secondary | ICD-10-CM | POA: Insufficient documentation

## 2015-10-06 DIAGNOSIS — R531 Weakness: Secondary | ICD-10-CM | POA: Diagnosis not present

## 2015-10-06 NOTE — Discharge Instructions (Signed)
Call Dr. Olin Pia office to make sure that they know that you could not tolerate the Entresto.  Be careful when you stand up.   Hypotension As your heart beats, it forces blood through your arteries. This force is your blood pressure. If your blood pressure is too low for you to go about your normal activities or to support the organs of your body, you have hypotension. Hypotension is also referred to as low blood pressure. When your blood pressure becomes too low, you may not get enough blood to your brain. As a result, you may feel weak, feel lightheaded, or develop a rapid heart rate. In a more severe case, you may faint. CAUSES Various conditions can cause hypotension. These include:  Blood loss.  Dehydration.  Heart or endocrine problems.  Pregnancy.  Severe infection.  Not having a well-balanced diet filled with needed nutrients.  Severe allergic reactions (anaphylaxis). Some medicines, such as blood pressure medicine or water pills (diuretics), may lower your blood pressure below normal. Sometimes taking too much medicine or taking medicine not as directed can cause hypotension. TREATMENT  Hospitalization is sometimes required for hypotension if fluid or blood replacement is needed, if time is needed for medicines to wear off, or if further monitoring is needed. Treatment might include changing your diet, changing your medicines (including medicines aimed at raising your blood pressure), and use of support stockings. HOME CARE INSTRUCTIONS   Drink enough fluids to keep your urine clear or pale yellow.  Take your medicines as directed by your health care provider.  Get up slowly from reclining or sitting positions. This gives your blood pressure a chance to adjust.  Wear support stockings as directed by your health care provider.  Maintain a healthy diet by including nutritious food, such as fruits, vegetables, nuts, whole grains, and lean meats. SEEK MEDICAL CARE IF:  You  have vomiting or diarrhea.  You have a fever for more than 2-3 days.  You feel more thirsty than usual.  You feel weak and tired. SEEK IMMEDIATE MEDICAL CARE IF:   You have chest pain or a fast or irregular heartbeat.  You have a loss of feeling in some part of your body, or you lose movement in your arms or legs.  You have trouble speaking.  You become sweaty or feel lightheaded.  You faint. MAKE SURE YOU:   Understand these instructions.  Will watch your condition.  Will get help right away if you are not doing well or get worse.   This information is not intended to replace advice given to you by your health care provider. Make sure you discuss any questions you have with your health care provider.   Document Released: 03/17/2005 Document Revised: 01/05/2013 Document Reviewed: 09/17/2012 Elsevier Interactive Patient Education [employer].

## 2015-10-06 NOTE — ED Notes (Signed)
Per [facility] patient from home.  Patient states she felt dizzy and short of breath after taking her first dose of a new prescription this morning, "Entresto".  Speaking in complete sentences.

## 2015-10-06 NOTE — ED Provider Notes (Signed)
CSN: [id number]     Arrival date & time 10/06/15  1528 History   First MD Initiated Contact with Patient 10/06/15 1539     Chief Complaint  Patient presents with  . Shortness of Breath     (Consider location/radiation/quality/duration/timing/severity/associated sxs/prior Treatment) HPI   [name] is a 82 y.o. female who presents for evaluation of hypotension. He took a first dose of interest though, this morning, and subsequently felt weak. She never checked her blood pressure and found it to be low. Lowest blood pressure. She checked, was 70 systolic. She presents by her vehicle, feeling better. Her doctor diuresed her starting last week, as a preparation to start this medication. She denies chest pain, paresthesia or focal weakness, nausea, vomiting, or back pain. There are no other known modifying factors.   Past Medical History  Diagnosis Date  . Nonischemic cardiomyopathy ([place])     Cath without coronary disease 2006  . Systolic CHF ([place])   . Hyperlipidemia   . Hypertension   . Osteoporosis   . Urge incontinence   . Raynaud phenomenon   . PAF (paroxysmal atrial fibrillation) ([facility])     Short bursts of PAF. Asymptomatic.  Sees Dr Rex Kras.  . Biventricular ICD (implantable cardioverter-defibrillator)-[employer]. Jude     Date of implant 2007 generator change 2012  . Sinus tachycardia ([place])   . Macro dislodgment of the left ventricular lead 09/09/2012   Past Surgical History  Procedure Laterality Date  . Appendectomy    . Biventricular icd implant  06/28/2010; 03/28/2013    STJ CRTD implanted 2012 with dislodgement of LV lead 2014; 03/28/2013 CRTP placed with new LV lead by Dr Caryl Comes  . Bladder tack    . Cardiac catheterization  03/2005    EF 25%, Normal arteries, Nonischemic CM  . Abdominal hysterectomy  1970s  . Suprapubic urethrovesicular suspension  1992  . Hip arthroplasty Left 12/20/2013    Procedure: LEFT HIP HEMI ARTHROPLASTY;  Surgeon: Meredith Pel, MD;  Location: [place];  Service: Orthopedics;  Laterality: Left;  . Left and right heart catheterization with coronary angiogram N/A 12/13/2012    Procedure: LEFT AND RIGHT HEART CATHETERIZATION WITH CORONARY ANGIOGRAM;  Surgeon: Blane Ohara, MD;  Location: [facility] CATH LAB;  Service: Cardiovascular;  Laterality: N/A;  . Bi-ventricular pacemaker insertion N/A 03/28/2013    Procedure: BI-VENTRICULAR PACEMAKER INSERTION (CRT-P);  Surgeon: Deboraha Sprang, MD;  Location: [facility] CATH LAB;  Service: Cardiovascular;  Laterality: N/A;  . Total hip arthroplasty Right 09/04/2014    Procedure: BIPOLAR HIP ARTHROPLASTY ANTERIOR APPROACH;  Surgeon: Mcarthur Rossetti, MD;  Location: [place];  Service: Orthopedics;  Laterality: Right;  . US echocardiography  11/18/2011    EF 40%, LA moderately dilated, mild AI  . Nm myocar perf wall motion  03/19/2010    EF 63%, low risk   Family History  Problem Relation Age of Onset  . Heart disease Mother   . Heart disease Father   . Cancer Father     Kidney  . Heart disease Brother   . Diabetes Son    Social History  Substance Use Topics  . Smoking status: Former Smoker    Quit date: 04/22/2002  . Smokeless tobacco: Never Used  . 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   OB History    No data available     Review of Systems  All other systems reviewed and are negative.  Allergies  Sulfamethoxazole  Home Medications   Prior to Admission medications   Medication Sig Start Date End Date Taking? Authorizing Provider  aspirin 81 MG tablet Take 81 mg by mouth daily.   Yes Historical Provider, MD  carvedilol (COREG) 6.25 MG tablet Take 6.25 mg by mouth 2 (two) times daily with a meal.   Yes Historical Provider, MD  Cranberry (SM CRANBERRY) 300 MG tablet Take 300 mg by mouth 2 (two) times daily.    Yes Historical Provider, MD  furosemide (LASIX) 40 MG tablet Take M/W/F Patient taking differently: Take 40 mg by mouth every Monday, Wednesday,  and Friday.  05/16/15  Yes Deboraha Sprang, MD  sacubitril-valsartan (ENTRESTO) 24-26 MG Take 1 tablet by mouth 2 (two) times daily. 09/18/15  Yes Deboraha Sprang, MD   BP 133/60 mmHg  Pulse 88  Temp(Src) 98.2 F (36.8 C) (Oral)  Resp 22  SpO2 97% Physical Exam  Constitutional: She is oriented to person, place, and time. She appears well-developed.  Elderly, frail  HENT:  Head: Normocephalic and atraumatic.  Right Ear: External ear normal.  Left Ear: External ear normal.  Eyes: Conjunctivae and EOM are normal. Pupils are equal, round, and reactive to light.  Neck: Normal range of motion and phonation normal. Neck supple.  Cardiovascular: Normal rate, regular rhythm and normal heart sounds.   Pulmonary/Chest: Effort normal and breath sounds normal. She exhibits no bony tenderness.  Abdominal: Soft. There is no tenderness.  Musculoskeletal: Normal range of motion.  Neurological: She is alert and oriented to person, place, and time. No cranial nerve deficit or sensory deficit. She exhibits normal muscle tone. Coordination normal.  Skin: Skin is warm, dry and intact.  Psychiatric: She has a normal mood and affect. Her behavior is normal. Judgment and thought content normal.  Nursing note and vitals reviewed.   ED Course  Procedures (including critical care time)  Medications - No data to display  Patient Vitals for the past 24 hrs:  BP Temp Temp src Pulse Resp SpO2  10/06/15 1540 133/60 mmHg 98.2 F (36.8 C) Oral 88 22 97 %    At discharge Reevaluation with update and discussion. After initial assessment and treatment, an updated evaluation reveals she remains comfortable. She was able to ambulate without dizziness or weakness. Findings discussed with the patient and her daughter, all questions were answered. [place] Review Labs Reviewed - No data to display  Imaging Review No results found. I have personally reviewed and evaluated these images and lab results  as part of my medical decision-making.   EKG Interpretation   Date/Time:  Saturday October 06 2015 15:35:09 EDT Ventricular Rate:  87 PR Interval:    QRS Duration: 128 QT Interval:  430 QTC Calculation: 512 R Axis:   -71 Text Interpretation:  atrial-sensed ventricular-paced complexes frequent  PVC Baseline wander in lead(s) V1 V2 Since last tracing PVC are new  Confirmed by Sanya Kobrin  MD, Keena Heesch [id number]) on 10/06/2015 4:07:06 PM      MDM   Final diagnoses:  Transient hypotension    Transient hypotension related to new medication use. Suspect mild volume depletion exacerbating the condition. Doubt ACS, PE or pneumonia.   Nursing Notes Reviewed/ Care Coordinated Applicable Imaging Reviewed Interpretation of Laboratory Data incorporated into ED treatment  The patient appears reasonably screened and/or stabilized for discharge and I doubt any other medical condition or other [facility] requiring further screening, evaluation, or treatment in the ED at this  time prior to discharge.  Plan: Home Medications- usual except Entreato 9stop); Home Treatments- rest; return here if the recommended treatment, does not improve the symptoms; Recommended follow up- Cards and PCP prn     Daleen Bo, MD 10/06/15 1700

## 2015-10-06 NOTE — Telephone Encounter (Signed)
Patient called Saturday answering service. She says she was told to take the new Entresto that was started 09/17/15 only if her BP was 110 or above. She has occasional low readings at home. Today it was 110 so she took the [place] as recommended. She subsequently began to feel lightheaded and SOB with subsequent BPs:  10:20: 101/68 10:52: 95/61 1:45: 73/54 2:41: 100/74  She continues to feel labored breathing and woozy despite improved BP. She said she didn't want to overreact but wonders if she should call 911. I told her if she is continuing to feel poorly that the next best step is to proceed to the ER and she should call 911 if she feels that's what's right. She verbalized understanding and gratitude.  Dayna Dunn PA-C

## 2015-10-08 NOTE — Addendum Note (Signed)
Addended by: Emmaline Life on: 10/08/2015 03:39 PM   Modules accepted: Orders, Medications

## 2015-10-08 NOTE — Telephone Encounter (Addendum)
Spoke with patient who states she is doing better today; states she went to exercise class this morning.  She reports BP today 100/58 mmHg, heart rate 88 bpm.  She states she is not willing to take any additional entresto.  I reviewed Dr. Olin Pia advice with her and she states she is willing to take losartan again but not entresto.  She states she is currently taking carvedilol 3.125 mg per Dr. Lennie Odor, PCP, recommendation and I answered her questions about the different actions of these 2 medications.  I answered questions to her satisfaction. I advised her to continue to monitor her vital signs on these 2 medications and to call back to report in a couple of weeks.  She thanked me for the call.

## 2015-10-08 NOTE — Telephone Encounter (Signed)
Continue to try, but if recurrent hypotension, would stop and we would then try just a bit of losartan 12.5

## 2015-10-09 MED ORDER — LOSARTAN POTASSIUM 25 MG PO TABS: 12.5000 mg | ORAL_TABLET | Freq: Every day | ORAL | Status: DC

## 2015-10-30 ENCOUNTER — Encounter: Payer: Self-pay | Admitting: Internal Medicine

## 2015-10-30 ENCOUNTER — Ambulatory Visit (INDEPENDENT_AMBULATORY_CARE_PROVIDER_SITE_OTHER): Payer: Commercial Managed Care - HMO | Admitting: Internal Medicine

## 2015-10-30 ENCOUNTER — Telehealth: Payer: Self-pay | Admitting: Internal Medicine

## 2015-10-30 ENCOUNTER — Ambulatory Visit (HOSPITAL_COMMUNITY)
Admission: RE | Admit: 2015-10-30 | Discharge: 2015-10-30 | Disposition: A | Discharge status: Home / Self Care | Payer: Commercial Managed Care - HMO | Source: Ambulatory Visit | Attending: Family Medicine | Admitting: Family Medicine

## 2015-10-30 VITALS — BP 137/61 | HR 92 | Temp 98.5°F | Ht 65.0 in | Wt 116.6 lb

## 2015-10-30 DIAGNOSIS — S81002A Unspecified open wound, left knee, initial encounter: Secondary | ICD-10-CM

## 2015-10-30 DIAGNOSIS — S81812A Laceration without foreign body, left lower leg, initial encounter: Secondary | ICD-10-CM

## 2015-10-30 DIAGNOSIS — S91002A Unspecified open wound, left ankle, initial encounter: Secondary | ICD-10-CM

## 2015-10-30 DIAGNOSIS — S81802A Unspecified open wound, left lower leg, initial encounter: Secondary | ICD-10-CM | POA: Diagnosis not present

## 2015-10-30 DIAGNOSIS — W2209XA Striking against other stationary object, initial encounter: Secondary | ICD-10-CM | POA: Diagnosis not present

## 2015-10-30 MED ORDER — CEPHALEXIN 500 MG PO CAPS: 500.0000 mg | ORAL_CAPSULE | Freq: Two times a day (BID) | ORAL | 0 refills | Status: AC

## 2015-10-30 NOTE — Patient Instructions (Signed)
[name],  Please take keflex twice daily x 1 week. Keep the dressing in place until then. Replace the kerlex (outer dressing) as needed.  Make an appointment for Friday afternoon with me to re-check the wound.  I have ordered an left lower leg x-ray. This can be done at [facility] between 7:30am and 4:30pm during the week.  Best, Dr. Ola Spurr

## 2015-10-30 NOTE — Telephone Encounter (Signed)
Left message for patient stating xray of left lower leg was normal.

## 2015-10-30 NOTE — Progress Notes (Signed)
[facility] Progress Note  Subjective:  [name] is an 85-y/o female who presents with non-healing leg wound.  Left leg wound: - Tripped and hit her leg against an open dishwasher door. Says she "left some of my meat" on the door.  - Injury occurred about 2 weeks ago  - Pt's daughters told her she probably needed stiches, but patient did not want to return to the Emergency Room, as she had just been there for hypotension due to one of her medications - She denies other falls - She has been cleaning the wound with hydrogen peroxide and applying triple antibiotic ointment - Wound hurts and seeps some fluid during her [employer] Balance exercise classes, so she thought she should get it evaluated. - She is only on aspirin - She has peripheral neuropathy  ROS: Denies fevers, chills, loss of appetite, easy bleeding  Social: Former smoker, former alcoholic  Objective: Blood pressure 137/61, pulse 92, temperature 98.5 F (36.9 C), temperature source Oral, height 5\' 5"  (1.651 m), weight 116 lb 9.6 oz (52.9 kg). Constitutional: Well-appearing older female, pleasant in NAD Cardiovascular: RRR, S1, S2, no m/r/g.  MSK: Trace pitting edema of both legs, which look tight and shiny. Full ROM of LEs. No trouble with gait.  Skin: 2x1.5cm wound of left lateral leg with irregular piece of skin left covering wound bed. Minimal erythema surrounding wound. Minimal tenderness of surrounding area.  Psychiatric: Normal mood and affect.  Vitals reviewed  Assessment/Plan: Leg wound, left - Consistent with skin tear. Skin has now receded too much to place stitches - Pt without fever, increased warmth at wound site and only minimal erythema around wound base, suggesting against infection. However, wound has been present for 2 weeks, so will prescribe keflex 500 mg BID x 7 days and ordered L lower leg x-ray - Placed colloid wound dressing over site of injury and wrapped with kerlex. Advised patient  to leave in place until follow-up, at which time another colloid dressing may need to be placed - Will recommend multivitamin at follow-up appointment to encourage healing  Follow-up in clinic in 3 days.  Olene Floss, MD [place], PGY-2

## 2015-10-31 DIAGNOSIS — S81802A Unspecified open wound, left lower leg, initial encounter: Secondary | ICD-10-CM | POA: Insufficient documentation

## 2015-10-31 NOTE — Assessment & Plan Note (Signed)
-   Consistent with skin tear. Skin has now receded too much to place stitches - Pt without fever, increased warmth at wound site and only minimal erythema around wound base, suggesting against infection. However, wound has been present for 2 weeks, so will prescribe keflex 500 mg BID x 7 days and ordered L lower leg x-ray - Placed colloid wound dressing over site of injury and wrapped with kerlex. Advised patient to leave in place until follow-up, at which time another colloid dressing may need to be placed - Will recommend multivitamin at follow-up appointment to encourage healing

## 2015-11-01 ENCOUNTER — Other Ambulatory Visit: Payer: Self-pay | Admitting: Family Medicine

## 2015-11-02 ENCOUNTER — Ambulatory Visit (INDEPENDENT_AMBULATORY_CARE_PROVIDER_SITE_OTHER): Payer: Commercial Managed Care - HMO | Admitting: Internal Medicine

## 2015-11-02 ENCOUNTER — Encounter: Payer: Self-pay | Admitting: Internal Medicine

## 2015-11-02 DIAGNOSIS — S81802D Unspecified open wound, left lower leg, subsequent encounter: Secondary | ICD-10-CM | POA: Diagnosis not present

## 2015-11-02 NOTE — Progress Notes (Signed)
[facility] Progress Note  Subjective:  [name] presents for follow-up of left leg wound. Initial injury occurred over 2 weeks ago when patient tripped into open dish washer door. Patient initially presented for evaluation 3 days ago, as wound was not healing well. She is here to check progress of wound healing after placing colloid dressing. She has been taking keflex as prescribed at last visit and had leg x-ray performed.  ROS: No fevers, chills  Social: Former smoker  Objective: Blood pressure 132/66, pulse 92, temperature 98.3 F (36.8 C), temperature source Oral, weight 116 lb 12.8 oz (53 kg). Constitutional: Well-appearing female, in NAD  Musculoskeletal: Trace pitting edema of legs bilaterally Skin: 3x2.5cm wound of left lateral shin with medial edge filling in with granulation tissue. Slight tenderness around wound with no increased warmth.  Vitals reviewed      X-ray of left lower leg 10/30/15 showed no fracture, no subq emphysema, no radiopaque foreign body or osteolysis  Assessment/Plan: Leg wound, left - Wound improved, with granulation tissue filling in - Replaced colloid dressing and gave patient replacement bandage to change on Monday to then remove Wednesday night - Advised patient to return if wound still open after these next 2 dressing changes - Strict return precautions given of increased redness or pain, fever, or n/v   Follow-up as needed.  Olene Floss, MD [place], PGY-2

## 2015-11-02 NOTE — Patient Instructions (Signed)
Ms. Pfeil,  Please complete your course of antibiotics.  Change your dressing Monday and apply new ulcer patch.  If wound edge is not coming together when you take bandage off Wednesday night, please contact clinic.  Best, Dr. Ola Spurr

## 2015-11-04 NOTE — Assessment & Plan Note (Signed)
-   Wound improved, with granulation tissue filling in - Replaced colloid dressing and gave patient replacement bandage to change on Monday to then remove Wednesday night - Advised patient to return if wound still open after these next 2 dressing changes - Strict return precautions given of increased redness or pain, fever, or n/v

## 2015-11-09 ENCOUNTER — Other Ambulatory Visit: Payer: Self-pay

## 2015-11-09 MED ORDER — FUROSEMIDE 40 MG PO TABS: ORAL_TABLET | ORAL | 1 refills | Status: DC

## 2015-11-29 ENCOUNTER — Telehealth: Payer: Self-pay | Admitting: Internal Medicine

## 2015-11-29 NOTE — Telephone Encounter (Signed)
Spoke with [name]. I made her aware that this is a theoretical risk and that no device has ever been hacked. We do have firmware upgrade for these devices available, but our clinic (along with many other clinics across the country) has determined that the risks of installing the upgrade outweigh the benefit. If in the future we determine that it would be beneficial, the firmware upgrade is available to [place] and we will notify our patients. She verbalizes understanding and has no other questions at this time.

## 2015-11-29 NOTE — Telephone Encounter (Signed)
New Message  Pt voiced she seen on [employer] that the [employer] over half a million peoples have been hacked.  Pt voiced the hackers can change it or cut it off.  Pt voiced she didn't know what to do besides to alert [place] if we did not know.  Please follow up with pt. Thanks!

## 2015-12-15 DIAGNOSIS — L03213 Periorbital cellulitis: Secondary | ICD-10-CM | POA: Diagnosis not present

## 2015-12-15 DIAGNOSIS — H00011 Hordeolum externum right upper eyelid: Secondary | ICD-10-CM | POA: Diagnosis not present

## 2015-12-17 ENCOUNTER — Encounter: Payer: Self-pay | Admitting: Family Medicine

## 2015-12-18 ENCOUNTER — Ambulatory Visit (INDEPENDENT_AMBULATORY_CARE_PROVIDER_SITE_OTHER): Payer: Commercial Managed Care - HMO | Admitting: *Deleted

## 2015-12-18 DIAGNOSIS — I429 Cardiomyopathy, unspecified: Secondary | ICD-10-CM

## 2015-12-18 DIAGNOSIS — I428 Other cardiomyopathies: Secondary | ICD-10-CM

## 2015-12-18 NOTE — Progress Notes (Signed)
Remote pacemaker transmission.   

## 2015-12-19 ENCOUNTER — Encounter: Payer: Self-pay | Admitting: Cardiology

## 2015-12-20 NOTE — Progress Notes (Signed)
Late entry. Saw patient during her son's appointment with me on 12/17/15 - she mentioned recent health developments:  Over the weekend developed redness & draining of her right eye. Seen at an urgent care center and given rx for keflex 500mg  three times daily and tobramycin 0.3% drops 1-2 drops four times a day  For 7 days.  Doing three times daily warm compresses Reports redness around eye on skin has already improved. Mild blurry vision occasionally due to drainage but overall is doing well without visual disturbance. Briefly examined - pupils equal round and reactive to light, sclera clear without any perilimbal redness, no photophobia  Seems she is being treated for periorbital cellulitis and has responded well to current medications. Advised if she worsens or has any questions to schedule a formal visit with me later this week.

## 2016-01-04 ENCOUNTER — Ambulatory Visit (INDEPENDENT_AMBULATORY_CARE_PROVIDER_SITE_OTHER): Payer: Commercial Managed Care - HMO | Admitting: *Deleted

## 2016-01-04 DIAGNOSIS — Z23 Encounter for immunization: Secondary | ICD-10-CM | POA: Diagnosis not present

## 2016-01-07 DIAGNOSIS — H5200 Hypermetropia, unspecified eye: Secondary | ICD-10-CM | POA: Diagnosis not present

## 2016-01-11 LAB — CUP PACEART REMOTE DEVICE CHECK
Battery Voltage: 2.96 V
Brady Statistic AS VP Percent: 95 %
Implantable Lead Location: 753859
Lead Channel Impedance Value: 360 Ohm
Lead Channel Impedance Value: 450 Ohm
Lead Channel Pacing Threshold Amplitude: 0.5 V
Lead Channel Pacing Threshold Pulse Width: 0.4 ms
Lead Channel Pacing Threshold Pulse Width: 0.7 ms
Lead Channel Sensing Intrinsic Amplitude: 4 mV
Lead Channel Setting Pacing Amplitude: 2 V
Lead Channel Setting Pacing Amplitude: 2.5 V
Lead Channel Setting Pacing Pulse Width: 0.4 ms
Lead Channel Setting Pacing Pulse Width: 0.7 ms
MDC IDC LEAD IMPLANT DT: 20070712
MDC IDC LEAD IMPLANT DT: 20070712
MDC IDC LEAD IMPLANT DT: 20141229
MDC IDC LEAD LOCATION: 753858
MDC IDC LEAD LOCATION: 753860
MDC IDC MSMT BATTERY REMAINING LONGEVITY: 77 mo
MDC IDC MSMT BATTERY REMAINING PERCENTAGE: 95.5 %
MDC IDC MSMT LEADCHNL LV IMPEDANCE VALUE: 450 Ohm
MDC IDC MSMT LEADCHNL LV PACING THRESHOLD AMPLITUDE: 1.125 V
MDC IDC MSMT LEADCHNL RA PACING THRESHOLD PULSEWIDTH: 0.4 ms
MDC IDC MSMT LEADCHNL RV PACING THRESHOLD AMPLITUDE: 0.75 V
MDC IDC MSMT LEADCHNL RV SENSING INTR AMPL: 10.2 mV
MDC IDC PG MODEL: 3242
MDC IDC PG SERIAL: 7554613
MDC IDC SESS DTM: 20170919061134
MDC IDC SET LEADCHNL LV PACING AMPLITUDE: 2.125
MDC IDC SET LEADCHNL RV SENSING SENSITIVITY: 2 mV
MDC IDC STAT BRADY AP VP PERCENT: 1 %
MDC IDC STAT BRADY AP VS PERCENT: 1 %
MDC IDC STAT BRADY AS VS PERCENT: 1.1 %
MDC IDC STAT BRADY RA PERCENT PACED: 1 %

## 2016-01-25 ENCOUNTER — Other Ambulatory Visit: Payer: Self-pay | Admitting: Internal Medicine

## 2016-01-25 MED ORDER — FUROSEMIDE 40 MG PO TABS: ORAL_TABLET | ORAL | 8 refills | Status: DC

## 2016-02-26 ENCOUNTER — Ambulatory Visit (INDEPENDENT_AMBULATORY_CARE_PROVIDER_SITE_OTHER): Payer: Commercial Managed Care - HMO | Admitting: *Deleted

## 2016-02-26 ENCOUNTER — Encounter: Payer: Self-pay | Admitting: *Deleted

## 2016-02-26 VITALS — BP 126/43 | HR 84 | Temp 97.6°F | Ht 65.0 in | Wt 120.4 lb

## 2016-02-26 DIAGNOSIS — Z23 Encounter for immunization: Secondary | ICD-10-CM

## 2016-02-26 DIAGNOSIS — Z0000 Encounter for general adult medical examination without abnormal findings: Secondary | ICD-10-CM

## 2016-02-26 NOTE — Patient Instructions (Signed)
Fall Prevention in the Home Introduction Falls can cause injuries. They can happen to people of all ages. There are many things you can do to make your home safe and to help prevent falls. What can I do on the outside of my home?  Regularly fix the edges of walkways and driveways and fix any cracks.  Remove anything that might make you trip as you walk through a door, such as a raised step or threshold.  Trim any bushes or trees on the path to your home.  Use bright outdoor lighting.  Clear any walking paths of anything that might make someone trip, such as rocks or tools.  Regularly check to see if handrails are loose or broken. Make sure that both sides of any steps have handrails.  Any raised decks and porches should have guardrails on the edges.  Have any leaves, snow, or ice cleared regularly.  Use sand or salt on walking paths during winter.  Clean up any spills in your garage right away. This includes oil or grease spills. What can I do in the bathroom?  Use night lights.  Install grab bars by the toilet and in the tub and shower. Do not use towel bars as grab bars.  Use non-skid mats or decals in the tub or shower.  If you need to sit down in the shower, use a plastic, non-slip stool.  Keep the floor dry. Clean up any water that spills on the floor as soon as it happens.  Remove soap buildup in the tub or shower regularly.  Attach bath mats securely with double-sided non-slip rug tape.  Do not have throw rugs and other things on the floor that can make you trip. What can I do in the bedroom?  Use night lights.  Make sure that you have a light by your bed that is easy to reach.  Do not use any sheets or blankets that are too big for your bed. They should not hang down onto the floor.  Have a firm chair that has side arms. You can use this for support while you get dressed.  Do not have throw rugs and other things on the floor that can make you trip. What  can I do in the kitchen?  Clean up any spills right away.  Avoid walking on wet floors.  Keep items that you use a lot in easy-to-reach places.  If you need to reach something above you, use a strong step stool that has a grab bar.  Keep electrical cords out of the way.  Do not use floor polish or wax that makes floors slippery. If you must use wax, use non-skid floor wax.  Do not have throw rugs and other things on the floor that can make you trip. What can I do with my stairs?  Do not leave any items on the stairs.  Make sure that there are handrails on both sides of the stairs and use them. Fix handrails that are broken or loose. Make sure that handrails are as long as the stairways.  Check any carpeting to make sure that it is firmly attached to the stairs. Fix any carpet that is loose or worn.  Avoid having throw rugs at the top or bottom of the stairs. If you do have throw rugs, attach them to the floor with carpet tape.  Make sure that you have a light switch at the top of the stairs and the bottom of the stairs. If  you do not have them, ask someone to add them for you. What else can I do to help prevent falls?  Wear shoes that:  Do not have high heels.  Have rubber bottoms.  Are comfortable and fit you well.  Are closed at the toe. Do not wear sandals.  If you use a stepladder:  Make sure that it is fully opened. Do not climb a closed stepladder.  Make sure that both sides of the stepladder are locked into place.  Ask someone to hold it for you, if possible.  Clearly mark and make sure that you can see:  Any grab bars or handrails.  First and last steps.  Where the edge of each step is.  Use tools that help you move around (mobility aids) if they are needed. These include:  Canes.  Walkers.  Scooters.  Crutches.  Turn on the lights when you go into a dark area. Replace any light bulbs as soon as they burn out.  Set up your furniture so you  have a clear path. Avoid moving your furniture around.  If any of your floors are uneven, fix them.  If there are any pets around you, be aware of where they are.  Review your medicines with your doctor. Some medicines can make you feel dizzy. This can increase your chance of falling. Ask your doctor what other things that you can do to help prevent falls. This information is not intended to replace advice given to you by your health care provider. Make sure you discuss any questions you have with your health care provider. Document Released: 01/11/2009 Document Revised: 08/23/2015 Document Reviewed: 04/21/2014  2017 [employer]  Health Maintenance, Female Introduction Adopting a healthy lifestyle and getting preventive care can go a long way to promote health and wellness. Talk with your health care provider about what schedule of regular examinations is right for you. This is a good chance for you to check in with your provider about disease prevention and staying healthy. In between checkups, there are plenty of things you can do on your own. Experts have done a lot of research about which lifestyle changes and preventive measures are most likely to keep you healthy. Ask your health care provider for more information. Weight and diet Eat a healthy diet  Be sure to include plenty of vegetables, fruits, low-fat dairy products, and lean protein.  Do not eat a lot of foods high in solid fats, added sugars, or salt.  Get regular exercise. This is one of the most important things you can do for your health.  Most adults should exercise for at least 150 minutes each week. The exercise should increase your heart rate and make you sweat (moderate-intensity exercise).  Most adults should also do strengthening exercises at least twice a week. This is in addition to the moderate-intensity exercise. Maintain a healthy weight  Body mass index (BMI) is a measurement that can be used to identify  possible weight problems. It estimates body fat based on height and weight. Your health care provider can help determine your BMI and help you achieve or maintain a healthy weight.  For females 21 years of age and older:  A BMI below 18.5 is considered underweight.  A BMI of 18.5 to 24.9 is normal.  A BMI of 25 to 29.9 is considered overweight.  A BMI of 30 and above is considered obese. Watch levels of cholesterol and blood lipids  You should start having your blood tested  for lipids and cholesterol at 80 years of age, then have this test every 5 years.  You may need to have your cholesterol levels checked more often if:  Your lipid or cholesterol levels are high.  You are older than 80 years of age.  You are at high risk for heart disease. Cancer screening Lung Cancer  Lung cancer screening is recommended for adults 25-63 years old who are at high risk for lung cancer because of a history of smoking.  A yearly low-dose CT scan of the lungs is recommended for people who:  Currently smoke.  Have quit within the past 15 years.  Have at least a 30-pack-year history of smoking. A pack year is smoking an average of one pack of cigarettes a day for 1 year.  Yearly screening should continue until it has been 15 years since you quit.  Yearly screening should stop if you develop a health problem that would prevent you from having lung cancer treatment. Breast Cancer  Practice breast self-awareness. This means understanding how your breasts normally appear and feel.  It also means doing regular breast self-exams. Let your health care provider know about any changes, no matter how small.  If you are in your 20s or 30s, you should have a clinical breast exam (CBE) by a health care provider every 1-3 years as part of a regular health exam.  If you are 10 or older, have a CBE every year. Also consider having a breast X-ray (mammogram) every year.  If you have a family history of  breast cancer, talk to your health care provider about genetic screening.  If you are at high risk for breast cancer, talk to your health care provider about having an MRI and a mammogram every year.  Breast cancer gene (BRCA) assessment is recommended for women who have family members with BRCA-related cancers. BRCA-related cancers include:  Breast.  Ovarian.  Tubal.  Peritoneal cancers.  Results of the assessment will determine the need for genetic counseling and BRCA1 and BRCA2 testing. Cervical Cancer  Your health care provider may recommend that you be screened regularly for cancer of the pelvic organs (ovaries, uterus, and vagina). This screening involves a pelvic examination, including checking for microscopic changes to the surface of your cervix (Pap test). You may be encouraged to have this screening done every 3 years, beginning at age 45.  For women ages 73-65, health care providers may recommend pelvic exams and Pap testing every 3 years, or they may recommend the Pap and pelvic exam, combined with testing for human papilloma virus (HPV), every 5 years. Some types of HPV increase your risk of cervical cancer. Testing for HPV may also be done on women of any age with unclear Pap test results.  Other health care providers may not recommend any screening for nonpregnant women who are considered low risk for pelvic cancer and who do not have symptoms. Ask your health care provider if a screening pelvic exam is right for you.  If you have had past treatment for cervical cancer or a condition that could lead to cancer, you need Pap tests and screening for cancer for at least 20 years after your treatment. If Pap tests have been discontinued, your risk factors (such as having a new sexual partner) need to be reassessed to determine if screening should resume. Some women have medical problems that increase the chance of getting cervical cancer. In these cases, your health care provider may  recommend more frequent  screening and Pap tests. Colorectal Cancer  This type of cancer can be detected and often prevented.  Routine colorectal cancer screening usually begins at 80 years of age and continues through 80 years of age.  Your health care provider may recommend screening at an earlier age if you have risk factors for colon cancer.  Your health care provider may also recommend using home test kits to check for hidden blood in the stool.  A small camera at the end of a tube can be used to examine your colon directly (sigmoidoscopy or colonoscopy). This is done to check for the earliest forms of colorectal cancer.  Routine screening usually begins at age 50.  Direct examination of the colon should be repeated every 5-10 years through 80 years of age. However, you may need to be screened more often if early forms of precancerous polyps or small growths are found. Skin Cancer  Check your skin from head to toe regularly.  Tell your health care provider about any new moles or changes in moles, especially if there is a change in a mole's shape or color.  Also tell your health care provider if you have a mole that is larger than the size of a pencil eraser.  Always use sunscreen. Apply sunscreen liberally and repeatedly throughout the day.  Protect yourself by wearing long sleeves, pants, a wide-brimmed hat, and sunglasses whenever you are outside. Heart disease, diabetes, and high blood pressure  High blood pressure causes heart disease and increases the risk of stroke. High blood pressure is more likely to develop in:  People who have blood pressure in the high end of the normal range (130-139/85-89 mm Hg).  People who are overweight or obese.  People who are African American.  If you are 18-39 years of age, have your blood pressure checked every 3-5 years. If you are 40 years of age or older, have your blood pressure checked every year. You should have your blood pressure  measured twice-once when you are at a hospital or clinic, and once when you are not at a hospital or clinic. Record the average of the two measurements. To check your blood pressure when you are not at a hospital or clinic, you can use:  An automated blood pressure machine at a pharmacy.  A home blood pressure monitor.  If you are between 55 years and 79 years old, ask your health care provider if you should take aspirin to prevent strokes.  Have regular diabetes screenings. This involves taking a blood sample to check your fasting blood sugar level.  If you are at a normal weight and have a low risk for diabetes, have this test once every three years after 80 years of age.  If you are overweight and have a high risk for diabetes, consider being tested at a younger age or more often. Preventing infection Hepatitis B  If you have a higher risk for hepatitis B, you should be screened for this virus. You are considered at high risk for hepatitis B if:  You were born in a country where hepatitis B is common. Ask your health care provider which countries are considered high risk.  Your parents were born in a high-risk country, and you have not been immunized against hepatitis B (hepatitis B vaccine).  You have HIV or AIDS.  You use needles to inject street drugs.  You live with someone who has hepatitis B.  You have had sex with someone who has hepatitis B.    You get hemodialysis treatment.  You take certain medicines for conditions, including cancer, organ transplantation, and autoimmune conditions. Hepatitis C  Blood testing is recommended for:  Everyone born from [birth year] through 1963.  Anyone with known risk factors for hepatitis C. Sexually transmitted infections (STIs)  You should be screened for sexually transmitted infections (STIs) including gonorrhea and chlamydia if:  You are sexually active and are younger than 80 years of age.  You are older than 80 years of age and  your health care provider tells you that you are at risk for this type of infection.  Your sexual activity has changed since you were last screened and you are at an increased risk for chlamydia or gonorrhea. Ask your health care provider if you are at risk.  If you do not have HIV, but are at risk, it may be recommended that you take a prescription medicine daily to prevent HIV infection. This is called pre-exposure prophylaxis (PrEP). You are considered at risk if:  You are sexually active and do not regularly use condoms or know the HIV status of your partner(s).  You take drugs by injection.  You are sexually active with a partner who has HIV. Talk with your health care provider about whether you are at high risk of being infected with HIV. If you choose to begin PrEP, you should first be tested for HIV. You should then be tested every 3 months for as long as you are taking PrEP. Pregnancy  If you are premenopausal and you may become pregnant, ask your health care provider about preconception counseling.  If you may become pregnant, take 400 to 800 micrograms (mcg) of folic acid every day.  If you want to prevent pregnancy, talk to your health care provider about birth control (contraception). Osteoporosis and menopause  Osteoporosis is a disease in which the bones lose minerals and strength with aging. This can result in serious bone fractures. Your risk for osteoporosis can be identified using a bone density scan.  If you are 5 years of age or older, or if you are at risk for osteoporosis and fractures, ask your health care provider if you should be screened.  Ask your health care provider whether you should take a calcium or vitamin D supplement to lower your risk for osteoporosis.  Menopause may have certain physical symptoms and risks.  Hormone replacement therapy may reduce some of these symptoms and risks. Talk to your health care provider about whether hormone replacement  therapy is right for you. Follow these instructions at home:  Schedule regular health, dental, and eye exams.  Stay current with your immunizations.  Do not use any tobacco products including cigarettes, chewing tobacco, or electronic cigarettes.  If you are pregnant, do not drink alcohol.  If you are breastfeeding, limit how much and how often you drink alcohol.  Limit alcohol intake to no more than 1 drink per day for nonpregnant women. One drink equals 12 ounces of beer, 5 ounces of wine, or 1 ounces of hard liquor.  Do not use street drugs.  Do not share needles.  Ask your health care provider for help if you need support or information about quitting drugs.  Tell your health care provider if you often feel depressed.  Tell your health care provider if you have ever been abused or do not feel safe at home. This information is not intended to replace advice given to you by your health care provider. Make sure you discuss any  questions you have with your health care provider. Document Released: 09/30/2010 Document Revised: 08/23/2015 Document Reviewed: 12/19/2014  2017 [employer]

## 2016-02-26 NOTE — Progress Notes (Signed)
Subjective:   [name] is a 82 y.o. female who presents for Medicare Annual (Subsequent) preventive examination.  Cardiac Risk Factors include: advanced age (>56men, >36 women);dyslipidemia;hypertension;smoking/ tobacco exposure     Objective:     Vitals: Pulse 84   Temp 97.6 F (36.4 C) (Oral)   Ht 5\' 5"  (1.651 m)   Wt 120 lb 6.4 oz (54.6 kg)   SpO2 100%   BMI 20.04 kg/m   Body mass index is 20.04 kg/m. BP 126/43 Denies dizziness or lightheadedness sitting or with standing.  Tobacco History  Smoking Status  . Former Smoker  . Packs/day: 1.00  . Years: 15.00  . Quit date: 04/22/2002  Smokeless Tobacco  . Never Used     Patient is former smoker with no plans to restart   Past Medical History:  Diagnosis Date  . Biventricular ICD (implantable cardioverter-defibrillator)-[employer]    Date of implant 2007 generator change 2012  . Hyperlipidemia   . Hypertension   . Macro dislodgment of the left ventricular lead 09/09/2012  . Nonischemic cardiomyopathy ([place])    Cath without coronary disease 2006  . Osteoporosis   . PAF (paroxysmal atrial fibrillation) ([facility])    Short bursts of PAF. Asymptomatic.  Sees Dr Rex Kras.  . Peripheral neuropathy, hereditary/idiopathic 2012  . Raynaud phenomenon   . Sinus tachycardia   . Systolic CHF ([place])   . Urge incontinence    Past Surgical History:  Procedure Laterality Date  . ABDOMINAL HYSTERECTOMY  1970s  . APPENDECTOMY    . BI-VENTRICULAR PACEMAKER INSERTION N/A 03/28/2013   Procedure: BI-VENTRICULAR PACEMAKER INSERTION (CRT-P);  Surgeon: Deboraha Sprang, MD;  Location: [facility] CATH LAB;  Service: Cardiovascular;  Laterality: N/A;  . Biventricular ICD Implant  06/28/2010; 03/28/2013   STJ CRTD implanted 2012 with dislodgement of LV lead 2014; 03/28/2013 CRTP placed with new LV lead by Dr Caryl Comes  . Bladder tack    . CARDIAC CATHETERIZATION  03/2005   EF 25%, Normal arteries, Nonischemic CM  . HIP ARTHROPLASTY Left 12/20/2013   Procedure: LEFT HIP HEMI ARTHROPLASTY;  Surgeon: Meredith Pel, MD;  Location: [place];  Service: Orthopedics;  Laterality: Left;  . LEFT AND RIGHT HEART CATHETERIZATION WITH CORONARY ANGIOGRAM N/A 12/13/2012   Procedure: LEFT AND RIGHT HEART CATHETERIZATION WITH CORONARY ANGIOGRAM;  Surgeon: Blane Ohara, MD;  Location: [facility] CATH LAB;  Service: Cardiovascular;  Laterality: N/A;  . NM MYOCAR PERF WALL MOTION  03/19/2010   EF 63%, low risk  . Suprapubic Urethrovesicular Suspension  1992  . TOTAL HIP ARTHROPLASTY Right 09/04/2014   Procedure: BIPOLAR HIP ARTHROPLASTY ANTERIOR APPROACH;  Surgeon: Mcarthur Rossetti, MD;  Location: [place];  Service: Orthopedics;  Laterality: Right;  . US ECHOCARDIOGRAPHY  11/18/2011   EF 40%, LA moderately dilated, mild AI   Family History  Problem Relation Age of Onset  . Heart disease Mother   . Heart disease Father   . Cancer Father     Kidney  . Arthritis Sister   . Heart disease Brother   . Diabetes Son   . Mental retardation Son   . Alcohol abuse Sister   . Cancer Brother   . Cancer Daughter   . Heart disease Daughter    History  Sexual Activity  . Sexual activity: Not on file    Outpatient Encounter Prescriptions as of 02/26/2016  Medication Sig  . aspirin 81 MG tablet Take 81 mg by mouth daily.  . carvedilol (COREG)  3.125 MG tablet Take 1 tablet (3.125 mg total) by mouth 2 (two) times daily with a meal.  . Cranberry (SM CRANBERRY) 300 MG tablet Take 300 mg by mouth 2 (two) times daily.   . furosemide (LASIX) 40 MG tablet Take M/W/F  . losartan (COZAAR) 25 MG tablet Take 0.5 tablets (12.5 mg total) by mouth daily.   No facility-administered encounter medications on file as of 02/26/2016.     Activities of Daily Living In your present state of health, do you have any difficulty performing the following activities: 02/26/2016  Hearing? N  Vision? N  Difficulty concentrating or making decisions? Y  Walking or climbing stairs? Y    Dressing or bathing? N  Doing errands, shopping? N  Preparing Food and eating ? N  Using the Toilet? N  In the past six months, have you accidently leaked urine? Y  Do you have problems with loss of bowel control? N  Managing your Medications? N  Managing your Finances? N  Housekeeping or managing your Housekeeping? N  Some recent data might be hidden   Home Safety:  My home has a working smoke alarm:  Yes, in every room           My home throw rugs have been fastened down to the floor or removed:  Room sized rugs only I have non-slip mats in the bathtub and shower:  Yes         All my home's stairs have railings or bannisters: One level home with 3 outside stairs with railings           My home's floors, stairs and hallways are free from clutter, wires and cords:  Yes     I wear seatbelts consistently:  Yes   Patient Care Team: [name], MD as PCP - General (Pediatrics) Deboraha Sprang, MD as Consulting Physician (Cardiology)  [facility] on [address] for eye exams Assessment:    Exercise Activities and Dietary recommendations Current Exercise Habits: Structured exercise class [facility]), Time (Minutes): 45, Frequency (Times/Week): 4, Weekly Exercise (Minutes/Week): 135, Intensity: Mild 180 minutes weekly Goals    . Eat 3-4 vegetables a day     Continue exercising 45 minutes 4 X weekly  Fall Risk Fall Risk  02/26/2016 06/26/2015 01/17/2015 01/10/2013  Falls in the past year? No No No No  Risk for fall due to : - - Impaired balance/gait -   Depression Screen PHQ 2/9 Scores 02/26/2016 06/26/2015 02/20/2015 02/05/2015  PHQ - 2 Score 0 0 0 0  Patient states sleep issues have been a lifelong problem.Patient given [facility] contact info to discuss difficulties with sleep.  TUG Test:  Done in 11 seconds. Patient used both hands to push out of chair only.  Cognitive Function: Mini-Cog  Passed with score 4/5    Cognitive Function MMSE - Mini Mental State Exam 01/10/2013  08/20/2011 07/22/2010  Orientation to time 5 5 5   Orientation to Place 5 5 5   Registration 3 3 3   Attention/ Calculation 5 5 5   Recall 2 2 3   Language- name 2 objects 2 2 2   Language- repeat 1 1 1   Language- follow 3 step command 3 3 3   Language- read & follow direction 1 1 1   Write a sentence 1 1 1   Copy design 1 1 1   Total score 29 29 30         Immunization History  Administered Date(s) Administered  . H1N1 04/12/2008  . Influenza  Split 12/27/2010, 12/12/2011  . Influenza Whole 12/28/2008, 01/03/2010  . Influenza,inj,Quad PF,36+ Mos 01/10/2013, 01/04/2016  . Influenza-Unspecified 01/13/2015  . PPD Test 09/06/2014  . Pneumococcal Polysaccharide-23 05/29/1997  . Td 11/29/2004   Screening Tests Health Maintenance  Topic Date Due  . PNA vac Low Risk Adult (2 of 2 - PCV13) 05/30/1998  . TETANUS/TDAP  11/30/2014  . INFLUENZA VACCINE  Completed  . DEXA SCAN  Completed  . ZOSTAVAX  Addressed  Patient willing to receive prevnar today, however, out of stock at present. Will receive at next OV  Patient will obtain TDaP at local pharmacy   Plan:   Discussed beginning Calcium 600 mg with Vit D [phone] IU daily with a meal.  Patient to get prevnar at next OV. Patient will obtain TDaP at local pharmacy.  During the course of the visit the patient was educated and counseled about the following appropriate screening and preventive services:   Vaccines to include Pneumoccal, Influenza, Td, Zostavax  Cardiovascular Disease  Colorectal cancer screening  Bone density screening  Diabetes screening  Mammography/PAP  Nutrition counseling   Patient Instructions (the written plan) was given to the patient.   Velora Heckler, RN  02/26/2016

## 2016-02-27 ENCOUNTER — Encounter: Payer: Self-pay | Admitting: *Deleted

## 2016-02-27 NOTE — Progress Notes (Signed)
I have reviewed this visit and discussed with Lauren Ducatte, RN, BSN, and agree with her documentation.   Ltanya Bayley J Wofford Stratton, MD  

## 2016-03-18 ENCOUNTER — Ambulatory Visit (INDEPENDENT_AMBULATORY_CARE_PROVIDER_SITE_OTHER): Payer: Commercial Managed Care - HMO | Admitting: *Deleted

## 2016-03-18 ENCOUNTER — Telehealth: Payer: Self-pay | Admitting: Cardiology

## 2016-03-18 DIAGNOSIS — I428 Other cardiomyopathies: Secondary | ICD-10-CM | POA: Diagnosis not present

## 2016-03-18 NOTE — Progress Notes (Signed)
Remote pacemaker transmission.   

## 2016-03-18 NOTE — Telephone Encounter (Signed)
Spoke with pt and reminded pt of remote transmission that is due today. Pt verbalized understanding.   

## 2016-03-19 ENCOUNTER — Encounter: Payer: Self-pay | Admitting: Cardiology

## 2016-03-26 LAB — CUP PACEART REMOTE DEVICE CHECK
Battery Remaining Longevity: 83 mo
Battery Voltage: 2.96 V
Brady Statistic AS VS Percent: 1.4 %
Brady Statistic RA Percent Paced: 1 %
Date Time Interrogation Session: 20171219192606
Implantable Lead Implant Date: 20141229
Implantable Lead Location: 753859
Implantable Lead Model: 7020
Implantable Pulse Generator Implant Date: 20141229
Lead Channel Impedance Value: 380 Ohm
Lead Channel Pacing Threshold Amplitude: 0.5 V
Lead Channel Pacing Threshold Amplitude: 0.75 V
Lead Channel Pacing Threshold Pulse Width: 0.4 ms
Lead Channel Setting Pacing Amplitude: 2 V
Lead Channel Setting Pacing Pulse Width: 0.4 ms
Lead Channel Setting Pacing Pulse Width: 0.7 ms
MDC IDC LEAD IMPLANT DT: 20070712
MDC IDC LEAD IMPLANT DT: 20070712
MDC IDC LEAD LOCATION: 753858
MDC IDC LEAD LOCATION: 753860
MDC IDC MSMT BATTERY REMAINING PERCENTAGE: 95.5 %
MDC IDC MSMT LEADCHNL LV IMPEDANCE VALUE: 530 Ohm
MDC IDC MSMT LEADCHNL LV PACING THRESHOLD AMPLITUDE: 0.875 V
MDC IDC MSMT LEADCHNL LV PACING THRESHOLD PULSEWIDTH: 0.7 ms
MDC IDC MSMT LEADCHNL RA SENSING INTR AMPL: 4.4 mV
MDC IDC MSMT LEADCHNL RV IMPEDANCE VALUE: 510 Ohm
MDC IDC MSMT LEADCHNL RV PACING THRESHOLD PULSEWIDTH: 0.4 ms
MDC IDC MSMT LEADCHNL RV SENSING INTR AMPL: 12 mV
MDC IDC SET LEADCHNL RA PACING AMPLITUDE: 2 V
MDC IDC SET LEADCHNL RV PACING AMPLITUDE: 2.5 V
MDC IDC SET LEADCHNL RV SENSING SENSITIVITY: 2 mV
MDC IDC STAT BRADY AP VP PERCENT: 1.2 %
MDC IDC STAT BRADY AP VS PERCENT: 1 %
MDC IDC STAT BRADY AS VP PERCENT: 93 %
Pulse Gen Model: 3242
Pulse Gen Serial Number: 7554613

## 2016-06-11 ENCOUNTER — Ambulatory Visit (INDEPENDENT_AMBULATORY_CARE_PROVIDER_SITE_OTHER): Payer: Medicare HMO | Admitting: Student

## 2016-06-11 ENCOUNTER — Encounter: Payer: Self-pay | Admitting: Student

## 2016-06-11 ENCOUNTER — Ambulatory Visit (HOSPITAL_COMMUNITY)
Admission: RE | Admit: 2016-06-11 | Discharge: 2016-06-11 | Disposition: A | Discharge status: Home / Self Care | Payer: Medicare HMO | Source: Ambulatory Visit | Attending: Family Medicine | Admitting: Family Medicine

## 2016-06-11 VITALS — BP 108/58 | HR 88 | Temp 98.3°F | Wt 117.0 lb

## 2016-06-11 DIAGNOSIS — R9431 Abnormal electrocardiogram [ECG] [EKG]: Secondary | ICD-10-CM | POA: Insufficient documentation

## 2016-06-11 DIAGNOSIS — R0609 Other forms of dyspnea: Secondary | ICD-10-CM | POA: Diagnosis not present

## 2016-06-11 DIAGNOSIS — I509 Heart failure, unspecified: Secondary | ICD-10-CM | POA: Insufficient documentation

## 2016-06-11 LAB — COMPLETE METABOLIC PANEL WITH GFR
ALT: 10 U/L (ref 6–29)
AST: 20 U/L (ref 10–35)
Albumin: 3.9 g/dL (ref 3.6–5.1)
Alkaline Phosphatase: 57 U/L (ref 33–130)
BILIRUBIN TOTAL: 0.6 mg/dL (ref 0.2–1.2)
BUN: 22 mg/dL (ref 7–25)
CALCIUM: 9.4 mg/dL (ref 8.6–10.4)
CO2: 28 mmol/L (ref 20–31)
Chloride: 99 mmol/L (ref 98–110)
Creat: 0.86 mg/dL (ref 0.60–0.88)
GFR, EST AFRICAN AMERICAN: 72 mL/min (ref >=60)
GFR, EST NON AFRICAN AMERICAN: 62 mL/min (ref >=60)
Glucose, Bld: 128 mg/dL — ABNORMAL HIGH (ref 65–99)
Potassium: 4.3 mmol/L (ref 3.5–5.3)
Sodium: 137 mmol/L (ref 135–146)
TOTAL PROTEIN: 6.6 g/dL (ref 6.1–8.1)

## 2016-06-11 NOTE — Assessment & Plan Note (Addendum)
History and physical exam consistent with CHF exacerbation. Weihgt has been overall stable, however subjective feeling of swelling and significant SOB concerning. Less likely ACS given stable EKG. Less likley primary pulmonary issue like PE given gradual onset of symtoms - will increase lasix to TID dosing for today, take an additional dose tomorrow AM and follow in clinic for evaluation tomorrow, Renal function on last check was normal - patient is very functional at baseline and very on board with staying out of the hospital if possible - CMP/BNP today - ED precautions discussed

## 2016-06-11 NOTE — Progress Notes (Signed)
   Subjective:    Patient ID: [name], female    DOB: 07-19-1929, 83 y.o.   MRN: [id number]   CC: Shortness of breath  HPI: 83 y/o F with H/o systolic HF presents for shortness of breath  Shortness of breath - started gradually over the last week - she has had to use two pillows to sleep during this time - she has also noted chest pressure but no pain - she has had increased lower extremity swelling as well - she has had mild cough, no fever  Smoking status reviewed  Review of Systems  Per HPI,  abdominal pain, N/V/D, weakness    Objective:  BP (!) 108/58   Pulse 88   Temp 98.3 F (36.8 C) (Oral)   Wt 117 lb (53.1 kg)   SpO2 97%   BMI 19.47 kg/m  Vitals and nursing note reviewed  General: NAD Cardiac: RRR,  Respiratory: CTAB, normal effort, good airmovement Abdomen: soft, nontender, nondistended, no hepatic or splenomegaly. Bowel sounds present Extremities: 2+ bilateral LE edema. Skin: warm and dry, no rashes noted Neuro: alert and oriented, no focal deficits  EKG- unchanged from prior, no evidence of ischemia   Assessment & Plan:    Acute exacerbation of CHF (congestive heart failure) ([place]) History and physical exam consistent with CHF exacerbation. Weihgt has been overall stable, however subjective feeling of swelling and significant SOB concerning. Less likely ACS given stable EKG. Less likley primary pulmonary issue like PE given gradual onset of symtoms - will increase lasix to TID dosing for today, take an additional dose tomorrow AM and follow in clinic for evaluation tomorrow, Renal function on last check was normal - patient is very functional at baseline and very on board with staying out of the hospital if possible - CMP/BNP today - ED precautions discussed     Irais Mottram A. Lincoln Brigham MD, [facility] Resident PGY-3 Pager [phone]

## 2016-06-11 NOTE — Patient Instructions (Signed)
Follow up tomorrow for heart failure check Take Lasix three times today, every 8 hours Take Lasix again tomorrow morning If you have worsening shortness of breath go to the ED Call the office with questions or concerns

## 2016-06-12 ENCOUNTER — Encounter: Payer: Self-pay | Admitting: Family Medicine

## 2016-06-12 ENCOUNTER — Ambulatory Visit (INDEPENDENT_AMBULATORY_CARE_PROVIDER_SITE_OTHER): Payer: Medicare HMO | Admitting: Family Medicine

## 2016-06-12 VITALS — BP 118/64 | HR 104 | Temp 98.2°F | Ht 65.0 in | Wt 115.8 lb

## 2016-06-12 DIAGNOSIS — R739 Hyperglycemia, unspecified: Secondary | ICD-10-CM

## 2016-06-12 DIAGNOSIS — R6889 Other general symptoms and signs: Secondary | ICD-10-CM

## 2016-06-12 DIAGNOSIS — R0602 Shortness of breath: Secondary | ICD-10-CM

## 2016-06-12 LAB — BRAIN NATRIURETIC PEPTIDE: Brain Natriuretic Peptide: 385.1 pg/mL — ABNORMAL HIGH (ref <100)

## 2016-06-12 LAB — INFLUENZA PANEL BY PCR (TYPE A & B)
Influenza A By PCR: NEGATIVE
Influenza B By PCR: POSITIVE — AB

## 2016-06-12 LAB — POCT GLYCOSYLATED HEMOGLOBIN (HGB A1C): Hemoglobin A1C: 6

## 2016-06-12 MED ORDER — OSELTAMIVIR PHOSPHATE 75 MG PO CAPS: 75.0000 mg | ORAL_CAPSULE | Freq: Two times a day (BID) | ORAL | 0 refills | Status: DC

## 2016-06-12 NOTE — Progress Notes (Signed)
Subjective:     Patient ID: [name], female   DOB: [birth year], 83 y.o.   MRN: [id number]  HPI [name] is an 83yo female presenting today for follow up of CHF exacerbation and new onset flu-like symptoms.  Last seen in the office yesterday, where she noted worsening shortness of breath, chest pressure, and edema. Was suspected to have CHF exacerbation, so lasix dose was increased to 40mg  three times a day from 40mg  every other day.   Today reports resolution of shortness of breath and edema. Has noted significant urine output with increases lasix frequency. Daughter believes she looks slightly dehydrated. Cardiac history significant for EF of only 25-30% and diffuse hypokinesis on echocardiogram in 04/2015. Despite low cardiac function, continues to refuse cardiology referral, stating she has been treated here for years and wishes to continue being treated here with a few specialists as possible. Will consider in future if worsening occurs.   Despite improvement in breathing, [name] continues to feel bad and believes she may have the flu. Had the flu shot this season. Notes decreased appetite, increased fatigue, headache, scratchy throat, cough, and body aches since yesterday. Denies fever. Denies sick contacts.   Former Smoker.  Review of Systems Per HPI    Objective:   Physical Exam  Constitutional: She appears well-developed and well-nourished.  HENT:  Mouth/Throat: Oropharynx is clear and moist.  Cardiovascular: Normal rate and regular rhythm.   No murmur heard. Pulmonary/Chest: Effort normal. No respiratory distress. She has no wheezes.  Musculoskeletal:  Trace Edema  Skin:  Dry mucous membranes. Sunken eye sockets.   Psychiatric: She has a normal mood and affect. Her behavior is normal.      Assessment and Plan:     1. Shortness of breath Resolved. Suspect secondary to CHF exacerbation with BNP elevated to 385. Now appears fluid down compared to fluid overload  noted just yesterday. Suspect it may take several adjustments to find the most appropriate lasix schedule. For now, will space back out to every other day (admits that while this was her prior schedule, she would often miss doses). She agrees to weigh herself daily and if she notes her weight increasing, will increase lasix to daily. If she continues to gain weight on daily dosing will increase to twice daily for two days before titrating lasix back down. Discussed that this was somewhat of a complex plan, but she believes she understands and would like to try this over a plan that is not adjusted by weight. Will follow up in one week or sooner if symptoms worsen. Discussed that given complexity of plan, she should call if she has any questions or concerns.   2. Flu Like Symptoms Flu positive. Prescription for Tamiflu sent to pharmacy.

## 2016-06-12 NOTE — Patient Instructions (Addendum)
Thank you so much for coming to visit today! We will check for flu and diabetes today. A prescription for Tamiflu was sent to the pharmacy. Please check your weight daily. If you notice your weight increasing by 1-2 pounds in one day, please take an extra dose of your lasix. It is important that you weight yourself on the same scale. Otherwise, take one tablet every other day with plans to increase to 1 tablet daily for 3 days if you notice an increase in weight. If your weight continues to rise on 1 tablet daily, please increase to 1 tablet twice a day for 2 days. Please follow up with Dr. Andria Frames next week as scheduled or sooner if needed.   Dr. Gerlean Ren

## 2016-06-17 ENCOUNTER — Ambulatory Visit (INDEPENDENT_AMBULATORY_CARE_PROVIDER_SITE_OTHER): Payer: Medicare HMO | Admitting: *Deleted

## 2016-06-17 DIAGNOSIS — I428 Other cardiomyopathies: Secondary | ICD-10-CM

## 2016-06-18 ENCOUNTER — Encounter: Payer: Self-pay | Admitting: Cardiology

## 2016-06-18 NOTE — Progress Notes (Signed)
Remote pacemaker transmission.   

## 2016-06-19 ENCOUNTER — Encounter: Payer: Self-pay | Admitting: Family Medicine

## 2016-06-19 ENCOUNTER — Ambulatory Visit (INDEPENDENT_AMBULATORY_CARE_PROVIDER_SITE_OTHER): Payer: Medicare HMO | Admitting: Family Medicine

## 2016-06-19 DIAGNOSIS — I5022 Chronic systolic (congestive) heart failure: Secondary | ICD-10-CM

## 2016-06-19 DIAGNOSIS — J111 Influenza due to unidentified influenza virus with other respiratory manifestations: Secondary | ICD-10-CM | POA: Diagnosis not present

## 2016-06-19 LAB — CUP PACEART REMOTE DEVICE CHECK
Brady Statistic RA Percent Paced: 1 % — CL
Date Time Interrogation Session: 20180322104501
Implantable Lead Implant Date: 20070712
Implantable Lead Implant Date: 20070712
Implantable Lead Location: 753858
Implantable Lead Model: 7020
Implantable Pulse Generator Implant Date: 20141229
Lead Channel Pacing Threshold Amplitude: 0.5 V
Lead Channel Pacing Threshold Pulse Width: 0.4 ms
Lead Channel Pacing Threshold Pulse Width: 0.7 ms
Lead Channel Sensing Intrinsic Amplitude: 3.5 mV
MDC IDC LEAD IMPLANT DT: 20141229
MDC IDC LEAD LOCATION: 753859
MDC IDC LEAD LOCATION: 753860
MDC IDC MSMT LEADCHNL LV PACING THRESHOLD AMPLITUDE: 1 V
MDC IDC MSMT LEADCHNL RA PACING THRESHOLD PULSEWIDTH: 0.4 ms
MDC IDC MSMT LEADCHNL RV PACING THRESHOLD AMPLITUDE: 0.75 V
MDC IDC MSMT LEADCHNL RV SENSING INTR AMPL: 12 mV
Pulse Gen Serial Number: 7554613

## 2016-06-19 NOTE — Progress Notes (Signed)
   Subjective:    Patient ID: [name], female    DOB: 09/07/1929, 83 y.o.   MRN: [id number]  HPI  Interesting sequence of events.  Patient seen on 06/11/16.  Wt was 115.8.  Had DOE and orthopnea and elevated BNP in patient with known HFrEF.  Thought to be mod CHF exac.  Lasix increased and sent home.  (exam in confusing since she always has bilateral leg swelling. That night suddenly felt worse with fever, cough and chills. Seen next day (3/15) with positive influenza. Returns with slow improvement.  Still cough and decreased energy.  Finished Tamiflu (had diarrhea with med).  No fever.  No second sickening.   Weighing herself daily, wt this am matches ours of 116.    Review of Systems     Objective:   Physical Exam  Lungs clear Cardiac RRR without m or g Ext 2+ edema.        Assessment & Plan:

## 2016-06-19 NOTE — Assessment & Plan Note (Signed)
Caught early and treated with tamiflu.  Recovering nicely especially given age and CHF.  Educated that weakness and cough may take weeks to resolve.  Watch for second sickening.

## 2016-06-19 NOTE — Assessment & Plan Note (Addendum)
As best I can tell, she has a very narrow wt range with 114 as my best guess dry wt (seemed to be a bit volume overloaded at 115.8 wt)  Will vary dose of lasix.

## 2016-06-19 NOTE — Patient Instructions (Signed)
Things seem to be going very well. See [place] again immediately if you have a second sickening.  Now for your lasix dose. If your weight is 114.2 or less, no lasix that day. If your weight is higher than 114.2 take one lasix that day. If your weight is 116.0 or higher take one lasix twice that day. If your weight is 120 or higher, take lasix twice a day and call us.  The fluid is building up and we want to do something about it before you land in the hospital.

## 2016-06-25 ENCOUNTER — Encounter: Payer: Self-pay | Admitting: Family Medicine

## 2016-06-25 DIAGNOSIS — R7303 Prediabetes: Secondary | ICD-10-CM | POA: Insufficient documentation

## 2016-07-01 ENCOUNTER — Other Ambulatory Visit: Payer: Self-pay | Admitting: Family Medicine

## 2016-07-01 NOTE — Telephone Encounter (Signed)
Pt called and would like to have Pneumonia shot called into the [facility]. She also said that the pharmacy said that she needs a new prescription for her Furosemide since the dosage has changed. jw

## 2016-07-03 MED ORDER — PNEUMOCOCCAL 13-VAL CONJ VACC IM SUSP: 0.5000 mL | Freq: Once | INTRAMUSCULAR | 0 refills | Status: AC

## 2016-07-03 MED ORDER — FUROSEMIDE 40 MG PO TABS: ORAL_TABLET | ORAL | 3 refills | Status: DC

## 2016-07-03 NOTE — Telephone Encounter (Signed)
She needs the 13-valent.

## 2016-07-03 NOTE — Telephone Encounter (Signed)
Thanks, rx sent in for this and for lasix. Please let patient know [name], MD

## 2016-07-03 NOTE — Telephone Encounter (Signed)
Red team, can you guys look [name] up in [place] and let me know which pneumonia shot she needs? (13 or 23)  Thanks Regina Rio, MD

## 2016-07-04 NOTE — Telephone Encounter (Signed)
Attempted to call pt but no answer/voicemail. Will try again later. Kortnee Bas Kennon Holter, CMA

## 2016-07-08 NOTE — Telephone Encounter (Signed)
Pt did get rx from the pharmacy. Bart [place] Kennon Holter, CMA

## 2016-09-17 ENCOUNTER — Ambulatory Visit (INDEPENDENT_AMBULATORY_CARE_PROVIDER_SITE_OTHER): Payer: Medicare HMO | Admitting: *Deleted

## 2016-09-17 DIAGNOSIS — I428 Other cardiomyopathies: Secondary | ICD-10-CM

## 2016-09-18 NOTE — Progress Notes (Signed)
Remote pacemaker transmission.   

## 2016-09-19 ENCOUNTER — Encounter: Payer: Self-pay | Admitting: Cardiology

## 2016-10-01 LAB — CUP PACEART REMOTE DEVICE CHECK
Battery Remaining Longevity: 80 mo
Brady Statistic AP VS Percent: 1 %
Brady Statistic AS VS Percent: 1.7 %
Brady Statistic RA Percent Paced: 1 %
Date Time Interrogation Session: 20180620061433
Implantable Lead Implant Date: 20141229
Implantable Lead Location: 753859
Implantable Lead Location: 753860
Implantable Lead Model: 7020
Implantable Pulse Generator Implant Date: 20141229
Lead Channel Impedance Value: 340 Ohm
Lead Channel Impedance Value: 530 Ohm
Lead Channel Pacing Threshold Amplitude: 0.5 V
Lead Channel Pacing Threshold Amplitude: 0.75 V
Lead Channel Pacing Threshold Amplitude: 1.25 V
Lead Channel Pacing Threshold Pulse Width: 0.4 ms
Lead Channel Sensing Intrinsic Amplitude: 12 mV
Lead Channel Setting Pacing Amplitude: 2 V
Lead Channel Setting Pacing Pulse Width: 0.4 ms
Lead Channel Setting Pacing Pulse Width: 0.7 ms
MDC IDC LEAD IMPLANT DT: 20070712
MDC IDC LEAD IMPLANT DT: 20070712
MDC IDC LEAD LOCATION: 753858
MDC IDC MSMT BATTERY REMAINING PERCENTAGE: 95.5 %
MDC IDC MSMT BATTERY VOLTAGE: 2.96 V
MDC IDC MSMT LEADCHNL LV PACING THRESHOLD PULSEWIDTH: 0.7 ms
MDC IDC MSMT LEADCHNL RA SENSING INTR AMPL: 3.9 mV
MDC IDC MSMT LEADCHNL RV IMPEDANCE VALUE: 430 Ohm
MDC IDC MSMT LEADCHNL RV PACING THRESHOLD PULSEWIDTH: 0.4 ms
MDC IDC SET LEADCHNL LV PACING AMPLITUDE: 2.25 V
MDC IDC SET LEADCHNL RV PACING AMPLITUDE: 2.5 V
MDC IDC SET LEADCHNL RV SENSING SENSITIVITY: 2 mV
MDC IDC STAT BRADY AP VP PERCENT: 1.7 %
MDC IDC STAT BRADY AS VP PERCENT: 91 %
Pulse Gen Model: 3242
Pulse Gen Serial Number: 7554613

## 2016-10-03 ENCOUNTER — Telehealth: Payer: Self-pay | Admitting: Internal Medicine

## 2016-10-03 NOTE — Telephone Encounter (Signed)
Patient requested that we cancel her remote check in September because she preferred not to have both a remote check and an office visit in the same month. Appointment for SK scheduled for 9/29 and remote check canceled.

## 2016-10-03 NOTE — Telephone Encounter (Signed)
Patient calling, states that she received two different letters and she has some questions. Patient states that one letter informed her that our clinic received transmission and patient states that she did not send in transmission so she is confused. The other letter states that she needs an appt with Dr. Caryl Comes for a pacer ck but patient also has home remote pacer ck. Patient asked that you call after 2pm. Please call to verify,thanks.

## 2016-11-07 ENCOUNTER — Other Ambulatory Visit: Payer: Self-pay | Admitting: *Deleted

## 2016-11-07 MED ORDER — LOSARTAN POTASSIUM 25 MG PO TABS: 12.5000 mg | ORAL_TABLET | Freq: Every day | ORAL | 1 refills | Status: DC

## 2016-11-24 ENCOUNTER — Ambulatory Visit (INDEPENDENT_AMBULATORY_CARE_PROVIDER_SITE_OTHER): Payer: Medicare HMO | Admitting: Family Medicine

## 2016-11-24 ENCOUNTER — Other Ambulatory Visit: Payer: Self-pay | Admitting: Family Medicine

## 2016-11-24 ENCOUNTER — Encounter: Payer: Self-pay | Admitting: Family Medicine

## 2016-11-24 VITALS — BP 122/60 | HR 98 | Temp 98.3°F | Ht 65.0 in | Wt 112.0 lb

## 2016-11-24 DIAGNOSIS — M810 Age-related osteoporosis without current pathological fracture: Secondary | ICD-10-CM

## 2016-11-24 DIAGNOSIS — G629 Polyneuropathy, unspecified: Secondary | ICD-10-CM | POA: Diagnosis not present

## 2016-11-24 DIAGNOSIS — Z23 Encounter for immunization: Secondary | ICD-10-CM | POA: Diagnosis not present

## 2016-11-24 MED ORDER — DULOXETINE HCL 20 MG PO CPEP
20.0000 mg | ORAL_CAPSULE | Freq: Every day | ORAL | 2 refills | Status: DC
Start: 2016-11-24 — End: 2017-02-24

## 2016-11-24 NOTE — Patient Instructions (Signed)
Trying low-dose cymbalta for your neuropathy Call if this is too expensive  Due for tetanus shot - take prescription to pharmacy  Giving you pneumonia vaccine today  Be well, Dr. [name]    Peripheral Neuropathy Peripheral neuropathy is a type of nerve damage. It affects nerves that carry signals between the spinal cord and other parts of the body. These are called peripheral nerves. With peripheral neuropathy, one nerve or a group of nerves may be damaged. What are the causes? Many things can damage peripheral nerves. For some people with peripheral neuropathy, the cause is unknown. Some causes include:  Diabetes. This is the most common cause of peripheral neuropathy.  Injury to a nerve.  Pressure or stress on a nerve that lasts a long time.  Too little vitamin B. Alcoholism can lead to this.  Infections.  Autoimmune diseases, such as multiple sclerosis and systemic lupus erythematosus.  Inherited nerve diseases.  Some medicines, such as cancer drugs.  Toxic substances, such as lead and mercury.  Too little blood flowing to the legs.  Kidney disease.  Thyroid disease.  What are the signs or symptoms? Different people have different symptoms. The symptoms you have will depend on which of your nerves is damaged. Common symptoms include:  Loss of feeling (numbness) in the feet and hands.  Tingling in the feet and hands.  Pain that burns.  Very sensitive skin.  Weakness.  Not being able to move a part of the body (paralysis).  Muscle twitching.  Clumsiness or poor coordination.  Loss of balance.  Not being able to control your bladder.  Feeling dizzy.  Sexual problems.  How is this diagnosed? Peripheral neuropathy is a symptom, not a disease. Finding the cause of peripheral neuropathy can be hard. To figure that out, your health care provider will take a medical history and do a physical exam. A neurological exam will also be done. This involves  checking things affected by your brain, spinal cord, and nerves (nervous system). For example, your health care provider will check your reflexes, how you move, and what you can feel. Other types of tests may also be ordered, such as:  Blood tests.  A test of the fluid in your spinal cord.  Imaging tests, such as CT scans or an MRI.  Electromyography (EMG). This test checks the nerves that control muscles.  Nerve conduction velocity tests. These tests check how fast messages pass through your nerves.  Nerve biopsy. A small piece of nerve is removed. It is then checked under a microscope.  How is this treated?  Medicine is often used to treat peripheral neuropathy. Medicines may include: ? Pain-relieving medicines. Prescription or over-the-counter medicine may be suggested. ? Antiseizure medicine. This may be used for pain. ? Antidepressants. These also may help ease pain from neuropathy. ? Lidocaine. This is a numbing medicine. You might wear a patch or be given a shot. ? Mexiletine. This medicine is typically used to help control irregular heart rhythms.  Surgery. Surgery may be needed to relieve pressure on a nerve or to destroy a nerve that is causing pain.  Physical therapy to help movement.  Assistive devices to help movement. Follow these instructions at home:  Only take over-the-counter or prescription medicines as directed by your health care provider. Follow the instructions carefully for any given medicines. Do not take any other medicines without first getting approval from your health care provider.  If you have diabetes, work closely with your health care provider to  keep your blood sugar under control.  If you have numbness in your feet: ? Check every day for signs of injury or infection. Watch for redness, warmth, and swelling. ? Wear padded socks and comfortable shoes. These help protect your feet.  Do not do things that put pressure on your damaged nerve.  Do  not smoke. Smoking keeps blood from getting to damaged nerves.  Avoid or limit alcohol. Too much alcohol can cause a lack of B vitamins. These vitamins are needed for healthy nerves.  Develop a good support system. Coping with peripheral neuropathy can be stressful. Talk to a mental health specialist or join a support group if you are struggling.  Follow up with your health care provider as directed. Contact a health care provider if:  You have new signs or symptoms of peripheral neuropathy.  You are struggling emotionally from dealing with peripheral neuropathy.  You have a fever. Get help right away if:  You have an injury or infection that is not healing.  You feel very dizzy or begin vomiting.  You have chest pain.  You have trouble breathing. This information is not intended to replace advice given to you by your health care provider. Make sure you discuss any questions you have with your health care provider. Document Released: 03/07/2002 Document Revised: 08/23/2015 Document Reviewed: 11/22/2012 Elsevier Interactive Patient Education  2017 [employer].

## 2016-11-24 NOTE — Assessment & Plan Note (Signed)
Worsening but chronic. Previously with fairly thorough lab workup. Discussed options today including repeating labs, referring for nerve conduction studies/EMG, or just trying medication to treat her symptoms. She prefers just to treat with medications. We'll try low-dose Cymbalta 20 mg daily. She previously did not tolerate gabapentin. She will call me if this medication is too expensive. Discussed checking her feet every day for signs of cuts or infection given decreased sensation.

## 2016-11-24 NOTE — Progress Notes (Signed)
Date of Visit: 11/24/2016   HPI:  Patient presents to discuss peripheral neuropathy.  Neuropathy: Reports this has been going on for 4-5 years, I saw her for her back in 2015 and we did a variety of lab work which was all unremarkable. Reports that her feet feel cold. It's a cold sensation. It is in her feet and goes up halfway up her shins. She thinks it is interfering with walking because it makes her feel unsteady on her feet. She does continue the senior center 3 times a week to do balance exercises. The sensation gets worse as the day goes on. Describes it as "freezing hot".  Osteoporosis: Previously had prescribed alendronate. She reports she did not take this as she could not remember to take it weekly. She is wary of the pharmaceutical industry and in general is not interested in many medications.  ROS: See HPI.  [place]: History of chronic systolic heart failure, hypertension, osteoporosis, hyperlipidemia, ICD in place  PHYSICAL EXAM: BP 122/60   Pulse 98   Temp 98.3 F (36.8 C) (Oral)   Ht 5\' 5"  (1.651 m)   Wt 112 lb (50.8 kg)   SpO2 98%   BMI 18.64 kg/m  Gen: No acute distress, pleasant, cooperative HEENT: Normocephalic, atraumatic Lungs: Normal effort Ext: Trace edema bilateral lower extremities. Feet are warm and well perfused. 2+ DP pulses bilaterally. Sensation intact over bilateral lower extremities to light touch. Diminished plantar sensation with monofilament, and sharp touch. Full strength with ankle plantar flexion/dorsiflexion/inversion/eversion. Diminished ankle reflexes bilaterally. Equivocal patellar reflexes bilaterally.  ASSESSMENT/PLAN:  Health maintenance:  -Recommended Tdap at her [place] [facility] given today -Plans to get flu shot, but wants to wait till later in the season  Peripheral neuropathy Worsening but chronic. Previously with fairly thorough lab workup. Discussed options today including repeating labs, referring for nerve conduction  studies/EMG, or just trying medication to treat her symptoms. She prefers just to treat with medications. We'll try low-dose Cymbalta 20 mg daily. She previously did not tolerate gabapentin. She will call me if this medication is too expensive. Discussed checking her feet every day for signs of cuts or infection given decreased sensation.  Osteoporosis Did not tolerate alendronate as she could not remember to take it weekly. Could consider discussing alternative therapies at future visits, however I doubt she would be interested.  FOLLOW UP: Follow up in 6 weeks in person or by phone for neuropathy  [place] J. [name], [place]

## 2016-11-24 NOTE — Assessment & Plan Note (Signed)
Did not tolerate alendronate as she could not remember to take it weekly. Could consider discussing alternative therapies at future visits, however I doubt she would be interested.

## 2016-12-12 ENCOUNTER — Encounter: Payer: Self-pay | Admitting: Internal Medicine

## 2016-12-26 ENCOUNTER — Ambulatory Visit (INDEPENDENT_AMBULATORY_CARE_PROVIDER_SITE_OTHER): Payer: Medicare HMO | Admitting: Internal Medicine

## 2016-12-26 ENCOUNTER — Encounter: Payer: Self-pay | Admitting: Internal Medicine

## 2016-12-26 VITALS — BP 120/70 | HR 96 | Ht 65.0 in | Wt 112.0 lb

## 2016-12-26 DIAGNOSIS — R000 Tachycardia, unspecified: Secondary | ICD-10-CM | POA: Diagnosis not present

## 2016-12-26 DIAGNOSIS — Z950 Presence of cardiac pacemaker: Secondary | ICD-10-CM | POA: Diagnosis not present

## 2016-12-26 DIAGNOSIS — I5022 Chronic systolic (congestive) heart failure: Secondary | ICD-10-CM | POA: Diagnosis not present

## 2016-12-26 DIAGNOSIS — I428 Other cardiomyopathies: Secondary | ICD-10-CM | POA: Diagnosis not present

## 2016-12-26 LAB — CUP PACEART INCLINIC DEVICE CHECK
Battery Remaining Longevity: 70 mo
Battery Voltage: 2.95 V
Brady Statistic RA Percent Paced: 0.04 %
Brady Statistic RV Percent Paced: 92 %
Date Time Interrogation Session: 20180928094122
Implantable Lead Implant Date: 20141229
Implantable Lead Location: 753859
Implantable Lead Model: 7020
Implantable Pulse Generator Implant Date: 20141229
Lead Channel Impedance Value: 437.5 Ohm
Lead Channel Impedance Value: 450 Ohm
Lead Channel Pacing Threshold Amplitude: 0.75 V
Lead Channel Pacing Threshold Pulse Width: 0.4 ms
Lead Channel Pacing Threshold Pulse Width: 0.4 ms
Lead Channel Pacing Threshold Pulse Width: 0.4 ms
Lead Channel Pacing Threshold Pulse Width: 0.4 ms
Lead Channel Setting Pacing Amplitude: 2 V
Lead Channel Setting Pacing Amplitude: 2.5 V
Lead Channel Setting Pacing Pulse Width: 0.4 ms
MDC IDC LEAD IMPLANT DT: 20070712
MDC IDC LEAD IMPLANT DT: 20070712
MDC IDC LEAD LOCATION: 753858
MDC IDC LEAD LOCATION: 753860
MDC IDC MSMT LEADCHNL LV PACING THRESHOLD AMPLITUDE: 1 V
MDC IDC MSMT LEADCHNL LV PACING THRESHOLD AMPLITUDE: 1 V
MDC IDC MSMT LEADCHNL LV PACING THRESHOLD PULSEWIDTH: 0.7 ms
MDC IDC MSMT LEADCHNL LV PACING THRESHOLD PULSEWIDTH: 0.7 ms
MDC IDC MSMT LEADCHNL RA IMPEDANCE VALUE: 337.5 Ohm
MDC IDC MSMT LEADCHNL RA PACING THRESHOLD AMPLITUDE: 0.75 V
MDC IDC MSMT LEADCHNL RA SENSING INTR AMPL: 3.2 mV
MDC IDC MSMT LEADCHNL RV PACING THRESHOLD AMPLITUDE: 0.75 V
MDC IDC MSMT LEADCHNL RV PACING THRESHOLD AMPLITUDE: 0.75 V
MDC IDC MSMT LEADCHNL RV SENSING INTR AMPL: 8.4 mV
MDC IDC SET LEADCHNL LV PACING PULSEWIDTH: 0.7 ms
MDC IDC SET LEADCHNL RA PACING AMPLITUDE: 2 V
MDC IDC SET LEADCHNL RV SENSING SENSITIVITY: 2 mV
Pulse Gen Model: 3242
Pulse Gen Serial Number: 7554613

## 2016-12-26 NOTE — Progress Notes (Signed)
Patient Care Team: [name], MD as PCP - General (Pediatrics) Deboraha Sprang, MD as Consulting Physician (Cardiology)   HPI  [name] is a 83 y.o. female  Seen in followup for CHF with previous CRT with LV lead dislodgement confirmed by CXR  in December she underwent downgrade of her device for ICD-->>   this is pacemaker insertion of a new LV lead.  Efforts for afterload reduction complicated by hypotension.  She underwent cath for the above and chest pain>>LV dysfunction mod 35-40% with Mlild MR  Echo 1/17 EF 20-25%     Date Cr K Hgb  3/18  0.86 4.3 13.6(2/17)              Past Medical History:  Diagnosis Date  . Biventricular ICD (implantable cardioverter-defibrillator)-[employer]    Date of implant 2007 generator change 2012  . Hyperlipidemia   . Hypertension   . Macro dislodgment of the left ventricular lead 09/09/2012  . Nonischemic cardiomyopathy ([place])    Cath without coronary disease 2006  . Osteoporosis   . PAF (paroxysmal atrial fibrillation) ([facility])    Short bursts of PAF. Asymptomatic.  Sees Dr Rex Kras.  . Peripheral neuropathy, hereditary/idiopathic 2012  . Raynaud phenomenon   . Sinus tachycardia   . Systolic CHF ([place])   . Urge incontinence     Past Surgical History:  Procedure Laterality Date  . ABDOMINAL HYSTERECTOMY  1970s  . APPENDECTOMY    . BI-VENTRICULAR PACEMAKER INSERTION N/A 03/28/2013   Procedure: BI-VENTRICULAR PACEMAKER INSERTION (CRT-P);  Surgeon: Deboraha Sprang, MD;  Location: [facility] CATH LAB;  Service: Cardiovascular;  Laterality: N/A;  . Biventricular ICD Implant  06/28/2010; 03/28/2013   STJ CRTD implanted 2012 with dislodgement of LV lead 2014; 03/28/2013 CRTP placed with new LV lead by Dr Caryl Comes  . Bladder tack    . CARDIAC CATHETERIZATION  03/2005   EF 25%, Normal arteries, Nonischemic CM  . HIP ARTHROPLASTY Left 12/20/2013   Procedure: LEFT HIP HEMI ARTHROPLASTY;  Surgeon: Meredith Pel, MD;  Location: [facility];  Service: Orthopedics;  Laterality: Left;  . LEFT AND RIGHT HEART CATHETERIZATION WITH CORONARY ANGIOGRAM N/A 12/13/2012   Procedure: LEFT AND RIGHT HEART CATHETERIZATION WITH CORONARY ANGIOGRAM;  Surgeon: Blane Ohara, MD;  Location: [facility] CATH LAB;  Service: Cardiovascular;  Laterality: N/A;  . NM MYOCAR PERF WALL MOTION  03/19/2010   EF 63%, low risk  . Suprapubic Urethrovesicular Suspension  1992  . TOTAL HIP ARTHROPLASTY Right 09/04/2014   Procedure: BIPOLAR HIP ARTHROPLASTY ANTERIOR APPROACH;  Surgeon: Mcarthur Rossetti, MD;  Location: [place];  Service: Orthopedics;  Laterality: Right;  . US ECHOCARDIOGRAPHY  11/18/2011   EF 40%, LA moderately dilated, mild AI    Current Outpatient Prescriptions  Medication Sig Dispense Refill  . aspirin 81 MG tablet Take 81 mg by mouth daily.    . carvedilol (COREG) 3.125 MG tablet Take 1 tablet (3.125 mg total) by mouth 2 (two) times daily with a meal. 60 tablet 11  . Cranberry (SM CRANBERRY) 300 MG tablet Take 300 mg by mouth 2 (two) times daily.     . DULoxetine (CYMBALTA) 20 MG capsule Take 1 capsule (20 mg total) by mouth daily. 30 capsule 2  . furosemide (LASIX) 40 MG tablet Take daily as instructed based on weights 30 tablet 3  . losartan (COZAAR) 25 MG tablet Take 0.5 tablets (12.5 mg total) by mouth daily. 15 tablet 1  No current facility-administered medications for this visit.     Allergies  Allergen Reactions  . Sulfamethoxazole Swelling    Review of Systems negative except from HPI and PMH  Physical Exam There were no vitals taken for this visit.  Well developed and nourished in no acute distress HENT normal Neck supple with JVP-8-9 Clear Regular but rapid rate and rhythm, no murmurs or gallops Abd-soft with active BS No Clubbing cyanosis 2+ aymmmetiricedema Skin-warm and dry A & Oriented  Grossly normal sensory and motor function  ECG demonstrates sinus rhythm with PACs versus pacing at a rate of 104    Assessment and plan  Nonischemic cardiomyopathy  Congestive heart failure-chronic systolic  Implantable device-CRT>>> defibrillator-->pacemaker The patient's device was interrogated and the information was fully reviewed.  The device was reprogrammed to increase the max tracking rate  Sinus tachycardia  Fatigue/daytime somnolence  Will stop asa  Euvolemic continue current meds

## 2016-12-26 NOTE — Patient Instructions (Addendum)
Medication Instructions: Your physician has recommended you make the following change in your medication:  -1) STOP Aspirin  Labwork: None Ordered   Procedures/Testing: None Ordered   Follow-Up: Remote monitoring is used to monitor your Pacemaker of ICD from home. This monitoring reduces the number of office visits required to check your device to one time per year. It allows [place] to keep an eye on the functioning of your device to ensure it is working properly. You are scheduled for a device check from home on 03/30/17. You may send your transmission at any time that day. If you have a wireless device, the transmission will be sent automatically. After your physician reviews your transmission, you will receive a postcard with your next transmission date.   Your physician wants you to follow-up in: 1 YEAR with Chanetta Marshall, NP. You will receive a reminder letter in the mail two months in advance. If you don't receive a letter, please call our office to schedule the follow-up appointment.   Any Additional Special Instructions Will Be Listed Below (If Applicable).     If you need a refill on your cardiac medications before your next appointment, please call your pharmacy.

## 2017-01-08 ENCOUNTER — Other Ambulatory Visit: Payer: Self-pay | Admitting: Internal Medicine

## 2017-01-19 DIAGNOSIS — Z0100 Encounter for examination of eyes and vision without abnormal findings: Secondary | ICD-10-CM | POA: Diagnosis not present

## 2017-01-19 DIAGNOSIS — H5200 Hypermetropia, unspecified eye: Secondary | ICD-10-CM | POA: Diagnosis not present

## 2017-02-16 ENCOUNTER — Telehealth: Payer: Self-pay | Admitting: Family Medicine

## 2017-02-16 NOTE — Telephone Encounter (Signed)
Returned call to patient Advised that since they've already had their flu shots this year it's fine for them to not get the high dose vaccine. If they want to consider the high dose vaccine next year, that's reasonable.  Patient appreciative [name], MD

## 2017-02-16 NOTE — Telephone Encounter (Signed)
Pt and her son [name] got "regular" flu shots 01-01-17 at [facility] family [facility]. She has found out there is a separate vaccinations for senior citizens.  Should she and rick get the other one for seniors? Please advise

## 2017-02-24 ENCOUNTER — Other Ambulatory Visit: Payer: Self-pay | Admitting: Family Medicine

## 2017-03-30 ENCOUNTER — Ambulatory Visit (INDEPENDENT_AMBULATORY_CARE_PROVIDER_SITE_OTHER): Payer: Medicare HMO | Admitting: *Deleted

## 2017-03-30 DIAGNOSIS — I428 Other cardiomyopathies: Secondary | ICD-10-CM

## 2017-04-01 NOTE — Progress Notes (Signed)
Remote pacemaker transmission.   

## 2017-04-02 LAB — CUP PACEART REMOTE DEVICE CHECK
Battery Remaining Longevity: 72 mo
Battery Voltage: 2.95 V
Brady Statistic AP VP Percent: 1.9 %
Brady Statistic AP VS Percent: 1 %
Brady Statistic AS VS Percent: 1.9 %
Brady Statistic RA Percent Paced: 1 %
Implantable Lead Implant Date: 20070712
Implantable Lead Implant Date: 20070712
Implantable Lead Implant Date: 20141229
Implantable Lead Location: 753858
Implantable Lead Location: 753859
Implantable Lead Location: 753860
Implantable Lead Model: 7020
Implantable Pulse Generator Implant Date: 20141229
Lead Channel Impedance Value: 340 Ohm
Lead Channel Impedance Value: 430 Ohm
Lead Channel Pacing Threshold Amplitude: 0.75 V
Lead Channel Pacing Threshold Amplitude: 0.75 V
Lead Channel Pacing Threshold Amplitude: 1 V
Lead Channel Pacing Threshold Pulse Width: 0.4 ms
Lead Channel Sensing Intrinsic Amplitude: 12 mV
Lead Channel Setting Pacing Amplitude: 2 V
Lead Channel Setting Pacing Pulse Width: 0.7 ms
Lead Channel Setting Sensing Sensitivity: 2 mV
MDC IDC MSMT BATTERY REMAINING PERCENTAGE: 89 %
MDC IDC MSMT LEADCHNL LV PACING THRESHOLD PULSEWIDTH: 0.7 ms
MDC IDC MSMT LEADCHNL RA SENSING INTR AMPL: 2.3 mV
MDC IDC MSMT LEADCHNL RV IMPEDANCE VALUE: 430 Ohm
MDC IDC MSMT LEADCHNL RV PACING THRESHOLD PULSEWIDTH: 0.4 ms
MDC IDC PG SERIAL: 7554613
MDC IDC SESS DTM: 20190102054729
MDC IDC SET LEADCHNL RA PACING AMPLITUDE: 2 V
MDC IDC SET LEADCHNL RV PACING AMPLITUDE: 2.5 V
MDC IDC SET LEADCHNL RV PACING PULSEWIDTH: 0.4 ms
MDC IDC STAT BRADY AS VP PERCENT: 90 %

## 2017-04-03 ENCOUNTER — Encounter: Payer: Self-pay | Admitting: Cardiology

## 2017-05-26 ENCOUNTER — Ambulatory Visit (INDEPENDENT_AMBULATORY_CARE_PROVIDER_SITE_OTHER): Payer: Medicare HMO | Admitting: Family Medicine

## 2017-05-26 VITALS — BP 120/70 | HR 83 | Temp 97.6°F | Ht 65.0 in | Wt 112.2 lb

## 2017-05-26 DIAGNOSIS — R5383 Other fatigue: Secondary | ICD-10-CM | POA: Diagnosis not present

## 2017-05-26 DIAGNOSIS — M810 Age-related osteoporosis without current pathological fracture: Secondary | ICD-10-CM | POA: Diagnosis not present

## 2017-05-26 MED ORDER — TETANUS-DIPHTH-ACELL PERTUSSIS 5-2.5-18.5 LF-MCG/0.5 IM SUSP: 0.5000 mL | Freq: Once | INTRAMUSCULAR | 0 refills | Status: AC

## 2017-05-26 NOTE — Patient Instructions (Addendum)
Checking labwork today. Someone should call you to schedule the sleep study.  Go to the pharmacy and get your tetanus shot.  Be well, Dr. [name]

## 2017-05-26 NOTE — Progress Notes (Signed)
Date of Visit: 05/26/2017   HPI:  Patient presents to discuss continued and loss of taste.  Fatigue: Has felt fatigued for about a year.  Does not sleep well at night.  Does feel tired during the day but has not fallen asleep at inappropriate times.  She is not sure if she snores.  Previously her cardiologist had recommended getting a sleep study.  Patient is agreeable to this.  Loss of taste improved: Has noticed this over the last year.  She can discern hot from cold but is not able to taste much of anything.  Is not sure if her smell has diminished as well.  Denies any head trauma or preceding injuries.  ROS: See HPI.  [place]: history of osteoporosis, chronic systolic CHF, hypertension, urge incontinence, hyperlipidemia, nonischemic cardiomyopathy w/ biventricular ICD, peripheral neuropathy  PHYSICAL EXAM: BP 120/70 (BP Location: Left Arm, Patient Position: Sitting, Cuff Size: Normal)   Pulse 83   Temp 97.6 F (36.4 C) (Oral)   Ht 5\' 5"  (1.651 m)   Wt 112 lb 3.2 oz (50.9 kg)   SpO2 96%   BMI 18.67 kg/m  Gen: No acute distress, pleasant, cooperative HEENT: Normocephalic, atraumatic, no cervical lymphadenopathy.  Oropharynx clear.  Tongue normal in appearance.  Nares patent.  TMs not visualized bilaterally due to winding ear canals.  Thyroid normal in size no nodules. Heart: Regular rate and rhythm Lungs: Clear to auscultation bilaterally, normal effort Neuro: Alert, grossly nonfocal, speech normal Ext: No edema  ASSESSMENT/PLAN:  Health maintenance:  -Given prescription for tetanus shot, advised to take to her pharmacy.  Fatigue Persisting.  Patient agreeable to sleep study.  Split-night study ordered.  I will also update her labs including CBC, vitamin D, complete metabolic panel, and TSH.  Loss of taste Tongue normal in appearance on exam.  Offered referral to ENT for evaluation, but patient declined this.  Does not want to pursue workup at this time.  Follow-up as  needed   [place] J. [name], [place]

## 2017-05-27 LAB — CBC
HEMATOCRIT: 43.6 % (ref 34.0–46.6)
HEMOGLOBIN: 15.1 g/dL (ref 11.1–15.9)
MCH: 33.4 pg — ABNORMAL HIGH (ref 26.6–33.0)
MCHC: 34.6 g/dL (ref 31.5–35.7)
MCV: 97 fL (ref 79–97)
Platelets: 139 10*3/uL — ABNORMAL LOW (ref 150–379)
RBC: 4.52 x10E6/uL (ref 3.77–5.28)
RDW: 14.3 % (ref 12.3–15.4)
WBC: 7.3 10*3/uL (ref 3.4–10.8)

## 2017-05-27 LAB — CMP14+EGFR
ALBUMIN: 4.4 g/dL (ref 3.5–4.7)
ALT: 13 IU/L (ref 0–32)
AST: 19 IU/L (ref 0–40)
Albumin/Globulin Ratio: 2.1 (ref 1.2–2.2)
Alkaline Phosphatase: 64 IU/L (ref 39–117)
BUN / CREAT RATIO: 23 (ref 12–28)
BUN: 17 mg/dL (ref 8–27)
Bilirubin Total: 0.5 mg/dL (ref 0.0–1.2)
CALCIUM: 9.5 mg/dL (ref 8.7–10.3)
CO2: 25 mmol/L (ref 20–29)
Chloride: 103 mmol/L (ref 96–106)
Creatinine, Ser: 0.74 mg/dL (ref 0.57–1.00)
GFR, EST AFRICAN AMERICAN: 85 mL/min/{1.73_m2} (ref >59)
GFR, EST NON AFRICAN AMERICAN: 74 mL/min/{1.73_m2} (ref >59)
GLOBULIN, TOTAL: 2.1 g/dL (ref 1.5–4.5)
Glucose: 80 mg/dL (ref 65–99)
Potassium: 5.2 mmol/L (ref 3.5–5.2)
SODIUM: 140 mmol/L (ref 134–144)
TOTAL PROTEIN: 6.5 g/dL (ref 6.0–8.5)

## 2017-05-27 LAB — VITAMIN D 25 HYDROXY (VIT D DEFICIENCY, FRACTURES): Vit D, 25-Hydroxy: 31.5 ng/mL (ref 30.0–100.0)

## 2017-05-27 LAB — TSH: TSH: 1.26 u[IU]/mL (ref 0.450–4.500)

## 2017-05-28 NOTE — Assessment & Plan Note (Signed)
Persisting.  Patient agreeable to sleep study.  Split-night study ordered.  I will also update her labs including CBC, vitamin D, complete metabolic panel, and TSH.

## 2017-05-29 ENCOUNTER — Encounter: Payer: Self-pay | Admitting: Family Medicine

## 2017-06-01 ENCOUNTER — Telehealth: Payer: Self-pay

## 2017-06-01 DIAGNOSIS — R5382 Chronic fatigue, unspecified: Secondary | ICD-10-CM

## 2017-06-01 NOTE — Telephone Encounter (Signed)
Spoke with terri and she says that it needs to be approved by [place]. Will send to [facility]. Deseree Kennon Holter, CMA

## 2017-06-01 NOTE — Telephone Encounter (Signed)
Terri with the [facility] at [facility] called and states patients insurance requires MD call to initiate authorization for her sleep study. Terri's call back [phone] Wallace Cullens, RN

## 2017-06-01 NOTE — Telephone Encounter (Signed)
I put in referral  Please give a verbal order from me to [place] if she needs that also  Thanks  [place]

## 2017-06-29 ENCOUNTER — Telehealth: Payer: Self-pay | Admitting: Cardiology

## 2017-06-29 ENCOUNTER — Ambulatory Visit (INDEPENDENT_AMBULATORY_CARE_PROVIDER_SITE_OTHER): Payer: Medicare HMO | Admitting: *Deleted

## 2017-06-29 DIAGNOSIS — I428 Other cardiomyopathies: Secondary | ICD-10-CM

## 2017-06-29 NOTE — Telephone Encounter (Signed)
LMOVM reminding pt to send remote transmission.   

## 2017-06-30 ENCOUNTER — Other Ambulatory Visit: Payer: Self-pay | Admitting: Family Medicine

## 2017-07-02 ENCOUNTER — Encounter: Payer: Self-pay | Admitting: Cardiology

## 2017-07-02 NOTE — Progress Notes (Signed)
Remote pacemaker transmission.   

## 2017-07-06 ENCOUNTER — Institutional Professional Consult (permissible substitution): Payer: Commercial Managed Care - HMO | Admitting: Neurology

## 2017-07-13 ENCOUNTER — Telehealth: Payer: Self-pay | Admitting: Family Medicine

## 2017-07-13 NOTE — Telephone Encounter (Signed)
Noted [name] [name], MD  

## 2017-07-13 NOTE — Telephone Encounter (Signed)
Patient is changing pharmacy (would like it changed in the chart) to [facility] on [address], [place].

## 2017-07-14 LAB — CUP PACEART REMOTE DEVICE CHECK
Battery Remaining Longevity: 64 mo
Brady Statistic AP VP Percent: 1.8 %
Brady Statistic AP VS Percent: 1 %
Brady Statistic AS VS Percent: 1.9 %
Brady Statistic RA Percent Paced: 1 %
Date Time Interrogation Session: 20190403022937
Implantable Lead Implant Date: 20070712
Implantable Lead Location: 753859
Implantable Pulse Generator Implant Date: 20141229
Lead Channel Impedance Value: 350 Ohm
Lead Channel Impedance Value: 450 Ohm
Lead Channel Pacing Threshold Amplitude: 0.75 V
Lead Channel Pacing Threshold Amplitude: 0.75 V
Lead Channel Pacing Threshold Amplitude: 1.375 V
Lead Channel Pacing Threshold Pulse Width: 0.4 ms
Lead Channel Pacing Threshold Pulse Width: 0.4 ms
Lead Channel Pacing Threshold Pulse Width: 0.7 ms
Lead Channel Sensing Intrinsic Amplitude: 12 mV
Lead Channel Setting Pacing Amplitude: 2 V
Lead Channel Setting Pacing Pulse Width: 0.4 ms
Lead Channel Setting Pacing Pulse Width: 0.7 ms
Lead Channel Setting Sensing Sensitivity: 2 mV
MDC IDC LEAD IMPLANT DT: 20070712
MDC IDC LEAD IMPLANT DT: 20141229
MDC IDC LEAD LOCATION: 753858
MDC IDC LEAD LOCATION: 753860
MDC IDC MSMT BATTERY REMAINING PERCENTAGE: 80 %
MDC IDC MSMT BATTERY VOLTAGE: 2.93 V
MDC IDC MSMT LEADCHNL RA SENSING INTR AMPL: 2.4 mV
MDC IDC MSMT LEADCHNL RV IMPEDANCE VALUE: 400 Ohm
MDC IDC SET LEADCHNL LV PACING AMPLITUDE: 2.375
MDC IDC SET LEADCHNL RV PACING AMPLITUDE: 2.5 V
MDC IDC STAT BRADY AS VP PERCENT: 90 %
Pulse Gen Model: 3242
Pulse Gen Serial Number: 7554613

## 2017-09-28 ENCOUNTER — Ambulatory Visit (INDEPENDENT_AMBULATORY_CARE_PROVIDER_SITE_OTHER): Payer: 59 | Admitting: *Deleted

## 2017-09-28 DIAGNOSIS — I428 Other cardiomyopathies: Secondary | ICD-10-CM | POA: Diagnosis not present

## 2017-09-28 DIAGNOSIS — I5022 Chronic systolic (congestive) heart failure: Secondary | ICD-10-CM

## 2017-09-28 NOTE — Progress Notes (Signed)
Remote pacemaker transmission.   

## 2017-10-17 LAB — CUP PACEART REMOTE DEVICE CHECK
Battery Remaining Longevity: 56 mo
Battery Remaining Percentage: 71 %
Battery Voltage: 2.92 V
Brady Statistic AP VP Percent: 1.7 %
Brady Statistic AP VS Percent: 1 %
Brady Statistic AS VP Percent: 90 %
Brady Statistic AS VS Percent: 1.8 %
Brady Statistic RA Percent Paced: 1 %
Implantable Lead Implant Date: 20070712
Implantable Lead Implant Date: 20070712
Implantable Lead Location: 753858
Implantable Lead Location: 753859
Implantable Lead Location: 753860
Implantable Lead Model: 7020
Lead Channel Impedance Value: 430 Ohm
Lead Channel Impedance Value: 480 Ohm
Lead Channel Pacing Threshold Amplitude: 0.75 V
Lead Channel Pacing Threshold Amplitude: 0.75 V
Lead Channel Pacing Threshold Pulse Width: 0.4 ms
Lead Channel Pacing Threshold Pulse Width: 0.7 ms
Lead Channel Sensing Intrinsic Amplitude: 12 mV
Lead Channel Sensing Intrinsic Amplitude: 3.3 mV
Lead Channel Setting Pacing Amplitude: 2 V
Lead Channel Setting Pacing Amplitude: 2.5 V
Lead Channel Setting Pacing Amplitude: 2.75 V
Lead Channel Setting Pacing Pulse Width: 0.4 ms
Lead Channel Setting Pacing Pulse Width: 0.7 ms
Lead Channel Setting Sensing Sensitivity: 2 mV
MDC IDC LEAD IMPLANT DT: 20141229
MDC IDC MSMT LEADCHNL LV PACING THRESHOLD AMPLITUDE: 1.75 V
MDC IDC MSMT LEADCHNL RA IMPEDANCE VALUE: 310 Ohm
MDC IDC MSMT LEADCHNL RV PACING THRESHOLD PULSEWIDTH: 0.4 ms
MDC IDC PG IMPLANT DT: 20141229
MDC IDC SESS DTM: 20190701110838
Pulse Gen Model: 3242
Pulse Gen Serial Number: 7554613

## 2017-11-30 ENCOUNTER — Other Ambulatory Visit: Payer: Self-pay | Admitting: Family Medicine

## 2017-12-01 ENCOUNTER — Other Ambulatory Visit: Payer: Self-pay | Admitting: Family Medicine

## 2017-12-24 ENCOUNTER — Other Ambulatory Visit: Payer: Self-pay | Admitting: Family Medicine

## 2017-12-24 ENCOUNTER — Ambulatory Visit (INDEPENDENT_AMBULATORY_CARE_PROVIDER_SITE_OTHER): Payer: Medicare HMO

## 2017-12-24 VITALS — BP 118/60 | HR 80 | Temp 97.6°F | Ht 65.0 in | Wt 107.0 lb

## 2017-12-24 DIAGNOSIS — Z0000 Encounter for general adult medical examination without abnormal findings: Secondary | ICD-10-CM | POA: Diagnosis not present

## 2017-12-24 NOTE — Patient Instructions (Addendum)
[name] , Thank you for taking time to come for your Medicare Wellness Visit. I appreciate your ongoing commitment to your health goals. Please review the following plan we discussed and let me know if I can assist you in the future.   These are the goals we discussed: Goals      Patient Stated   . Exercise 4x per week (45) min per time) (pt-stated)     Patient has been doing this and hopes to continue doing this over next year.  Hubbard Hartshorn, RN, BSN        Other   . Patient Stated     Maintain current health.       This is a list of the screening recommended for you and due dates:  Health Maintenance  Topic Date Due  . Tetanus Vaccine  05/30/2027  . Flu Shot  Completed  . DEXA scan (bone density measurement)  Completed  . Pneumonia vaccines  Completed    Fall Prevention in the Home Falls can cause injuries. They can happen to people of all ages. There are many things you can do to make your home safe and to help prevent falls. What can I do on the outside of my home?  Regularly fix the edges of walkways and driveways and fix any cracks.  Remove anything that might make you trip as you walk through a door, such as a raised step or threshold.  Trim any bushes or trees on the path to your home.  Use bright outdoor lighting.  Clear any walking paths of anything that might make someone trip, such as rocks or tools.  Regularly check to see if handrails are loose or broken. Make sure that both sides of any steps have handrails.  Any raised decks and porches should have guardrails on the edges.  Have any leaves, snow, or ice cleared regularly.  Use sand or salt on walking paths during winter.  Clean up any spills in your garage right away. This includes oil or grease spills. What can I do in the bathroom?  Use night lights.  Install grab bars by the toilet and in the tub and shower. Do not use towel bars as grab bars.  Use non-skid mats or decals in the tub or  shower.  If you need to sit down in the shower, use a plastic, non-slip stool.  Keep the floor dry. Clean up any water that spills on the floor as soon as it happens.  Remove soap buildup in the tub or shower regularly.  Attach bath mats securely with double-sided non-slip rug tape.  Do not have throw rugs and other things on the floor that can make you trip. What can I do in the bedroom?  Use night lights.  Make sure that you have a light by your bed that is easy to reach.  Do not use any sheets or blankets that are too big for your bed. They should not hang down onto the floor.  Have a firm chair that has side arms. You can use this for support while you get dressed.  Do not have throw rugs and other things on the floor that can make you trip. What can I do in the kitchen?  Clean up any spills right away.  Avoid walking on wet floors.  Keep items that you use a lot in easy-to-reach places.  If you need to reach something above you, use a strong step stool that has a grab  bar.  Keep electrical cords out of the way.  Do not use floor polish or wax that makes floors slippery. If you must use wax, use non-skid floor wax.  Do not have throw rugs and other things on the floor that can make you trip. What can I do with my stairs?  Do not leave any items on the stairs.  Make sure that there are handrails on both sides of the stairs and use them. Fix handrails that are broken or loose. Make sure that handrails are as long as the stairways.  Check any carpeting to make sure that it is firmly attached to the stairs. Fix any carpet that is loose or worn.  Avoid having throw rugs at the top or bottom of the stairs. If you do have throw rugs, attach them to the floor with carpet tape.  Make sure that you have a light switch at the top of the stairs and the bottom of the stairs. If you do not have them, ask someone to add them for you. What else can I do to help prevent  falls?  Wear shoes that: ? Do not have high heels. ? Have rubber bottoms. ? Are comfortable and fit you well. ? Are closed at the toe. Do not wear sandals.  If you use a stepladder: ? Make sure that it is fully opened. Do not climb a closed stepladder. ? Make sure that both sides of the stepladder are locked into place. ? Ask someone to hold it for you, if possible.  Clearly mark and make sure that you can see: ? Any grab bars or handrails. ? First and last steps. ? Where the edge of each step is.  Use tools that help you move around (mobility aids) if they are needed. These include: ? Canes. ? Walkers. ? Scooters. ? Crutches.  Turn on the lights when you go into a dark area. Replace any light bulbs as soon as they burn out.  Set up your furniture so you have a clear path. Avoid moving your furniture around.  If any of your floors are uneven, fix them.  If there are any pets around you, be aware of where they are.  Review your medicines with your doctor. Some medicines can make you feel dizzy. This can increase your chance of falling. Ask your doctor what other things that you can do to help prevent falls. This information is not intended to replace advice given to you by your health care provider. Make sure you discuss any questions you have with your health care provider. Document Released: 01/11/2009 Document Revised: 08/23/2015 Document Reviewed: 04/21/2014 Elsevier Interactive Patient Education  [employer].

## 2017-12-24 NOTE — Progress Notes (Signed)
Subjective:   [name] is a 84 y.o. female who presents for Medicare Annual (Subsequent) preventive examination.  Review of Systems:  Physical assessment deferred to PCP.  Cardiac Risk Factors include: advanced age (>77men, >65 women)    Objective:    Vitals: BP 118/60   Pulse 80   Temp 97.6 F (36.4 C) (Oral)   Ht 5\' 5"  (1.651 m)   Wt 107 lb (48.5 kg)   SpO2 98%   BMI 17.81 kg/m   Body mass index is 17.81 kg/m.  Advanced Directives 12/24/2017 05/26/2017 11/24/2016 06/19/2016 06/12/2016 06/11/2016 02/26/2016  Does Patient Have a Medical Advance Directive? Yes No No Yes Yes Yes Yes  Type of Paramedic of [name];Living will - - [name];Living will [place];Living will Living will [name];Living will  Does patient want to make changes to medical advance directive? - - - - - - No - Patient declined  Copy of [name] in Chart? No - copy requested - - Yes Yes - No - copy requested  Would patient like information on creating a medical advance directive? - No - Patient declined No - Patient declined - - - -  Pre-existing out of facility DNR order (yellow form or pink MOST form) - - - - - - -   Tobacco Social History   Tobacco Use  Smoking Status Former Smoker  . Packs/day: 1.00  . Years: 15.00  . Pack years: 15.00  . Last attempt to quit: 04/22/2002  . Years since quitting: 15.6  Smokeless Tobacco Never Used     Counseling given: Not Answered  Clinical Intake:  Pre-visit preparation completed: Yes  Pain : No/denies pain Pain Score: 0-No pain   Nutritional Status: BMI <19  Underweight Nutritional Risks: None Diabetes: No  How often do you need to have someone help you when you read instructions, pamphlets, or other written materials from your doctor or pharmacy?: 1 - Never What is the last grade level you completed in school?: Masters Degree  Interpreter  Needed?: No    Past Medical History:  Diagnosis Date  . Biventricular ICD (implantable cardioverter-defibrillator)-St. Jude    Date of implant 2007 generator change 2012  . Hyperlipidemia   . Hypertension   . Macro dislodgment of the left ventricular lead 09/09/2012  . Nonischemic cardiomyopathy ([place])    Cath without coronary disease 2006  . Osteoporosis   . PAF (paroxysmal atrial fibrillation) (HCC)    Short bursts of PAF. Asymptomatic.  Sees Dr Rex Kras.  . Peripheral neuropathy, hereditary/idiopathic 2012  . Raynaud phenomenon   . Sinus tachycardia   . Systolic CHF ([place])   . Urge incontinence    Past Surgical History:  Procedure Laterality Date  . ABDOMINAL HYSTERECTOMY  1970s  . APPENDECTOMY    . BI-VENTRICULAR PACEMAKER INSERTION N/A 03/28/2013   Procedure: BI-VENTRICULAR PACEMAKER INSERTION (CRT-P);  Surgeon: Deboraha Sprang, MD;  Location: [facility] CATH LAB;  Service: Cardiovascular;  Laterality: N/A;  . Biventricular ICD Implant  06/28/2010; 03/28/2013   STJ CRTD implanted 2012 with dislodgement of LV lead 2014; 03/28/2013 CRTP placed with new LV lead by Dr Caryl Comes  . Bladder tack    . CARDIAC CATHETERIZATION  03/2005   EF 25%, Normal arteries, Nonischemic CM  . HIP ARTHROPLASTY Left 12/20/2013   Procedure: LEFT HIP HEMI ARTHROPLASTY;  Surgeon: Meredith Pel, MD;  Location: [place];  Service: Orthopedics;  Laterality: Left;  .  LEFT AND RIGHT HEART CATHETERIZATION WITH CORONARY ANGIOGRAM N/A 12/13/2012   Procedure: LEFT AND RIGHT HEART CATHETERIZATION WITH CORONARY ANGIOGRAM;  Surgeon: Blane Ohara, MD;  [facility];  Service: Cardiovascular;  Laterality: N/A;  . NM MYOCAR PERF WALL MOTION  03/19/2010   EF 63%, low risk  . Suprapubic Urethrovesicular Suspension  1992  . TOTAL HIP ARTHROPLASTY Right 09/04/2014   Procedure: BIPOLAR HIP ARTHROPLASTY ANTERIOR APPROACH;  Surgeon: Mcarthur Rossetti, MD;  Location: [place];  Service: Orthopedics;  Laterality: Right;  . US  ECHOCARDIOGRAPHY  11/18/2011   EF 40%, LA moderately dilated, mild AI   Family History  Problem Relation Age of Onset  . Heart disease Mother   . Heart disease Father   . Cancer Father        Kidney  . Arthritis Sister   . Heart disease Brother   . Diabetes Son   . Mental retardation Son   . Alcohol abuse Sister   . Cancer Brother   . Cancer Daughter   . Heart disease Daughter    Social History   Socioeconomic History  . Marital status: Widowed    Spouse name: Not on file  . Number of children: 4  . Years of education: masters  . Highest education level: Master's degree (e.g., MA, MS, MEng, MEd, MSW, MBA)  Occupational History  . Occupation: Retired- Advertising copywriter: RETIRED  Social Needs  . Financial resource strain: Not hard at all  . Food insecurity:    Worry: Never true    Inability: Never true  . Transportation needs:    Medical: Yes    Non-medical: Yes  Tobacco Use  . Smoking status: Former Smoker    Packs/day: 1.00    Years: 15.00    Pack years: 15.00    Last attempt to quit: 04/22/2002    Years since quitting: 15.6  . Smokeless tobacco: Never Used  Substance and Sexual Activity  . 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 Quit 1979.  . Drug use: No    Comment: Prescription drug addict x 41 years.  . Sexual activity: Not Currently  Lifestyle  . Physical activity:    Days per week: 4 days    Minutes per session: 40 min  . Stress: Not at all  Relationships  . Social connections:    Talks on phone: More than three times a week    Gets together: More than three times a week    Attends religious service: Never    Active member of club or organization: No    Attends meetings of clubs or organizations: Never    Relationship status: Widowed  Other Topics Concern  . Not on file  Social History Narrative   Health Care POA: [name] (h) [phone] (c) [phone]   [name] has copy of Living Will and last will and  testament. Has made plans for body to be donated to [facility] for study.   Emergency Contact: [name]   End of Life Plan: DNR/DNI   Who lives with you: Lives with son who is handicapped   Any pets: bird, [place]   Diet: Patient has a varied diet and does not eat fish or a lot of meat. Has lost sense of taste and smell so only eats to sustain.   Exercise:  gardens, and cleans home   Seatbelts: Patient reports wearing seat belt when in vehicle.  Sun Exposure/Protection: Patient denies wearing sun protection    Hobbies: Reading, gardening, visiting family   Lives in single story home, no stairs, grab bars in bathroom, no throw rugs on floor. No firearms. Has smoke and CO2 detectors.      Widowed 4 children- son Delfino Lovett lives with her (mild MR).       Retired, formerly was Buyer, retail.  Recovered alcoholic, recovered prescription drug addict.  Quit 1979. Quit Smoking 2004.    Outpatient Encounter Medications as of 12/24/2017  Medication Sig  . acetaminophen (TYLENOL) 650 MG CR tablet Take 650 mg by mouth every 8 (eight) hours as needed for pain.  . carvedilol (COREG) 3.125 MG tablet TAKE 1 TABLET BY MOUTH TWICE DAILY WITH MEALS  . Cranberry (SM CRANBERRY) 300 MG tablet Take 300 mg by mouth 2 (two) times daily.   . furosemide (LASIX) 40 MG tablet TAKE 1 TABLET BY MOUTH EVERY DAY AS INSTRUCTED BASED ON WEIGHT GAIN  . losartan (COZAAR) 25 MG tablet Take 0.5 tablets (12.5 mg total) by mouth daily.  Marland Kitchen FLUAD 0.5 ML SUSY ADM 0.5ML IM UTD   No facility-administered encounter medications on file as of 12/24/2017.     Activities of Daily Living In your present state of health, do you have any difficulty performing the following activities: 12/24/2017  Hearing? N  Vision? N  Difficulty concentrating or making decisions? N  Walking or climbing stairs? Y  Comment no stairs in home, uses cane  Dressing or bathing? N  Doing errands, shopping? N  Preparing Food and eating ? N    Using the Toilet? N  In the past six months, have you accidently leaked urine? N  Do you have problems with loss of bowel control? N  Managing your Medications? N  Managing your Finances? N  Housekeeping or managing your Housekeeping? N  Some recent data might be hidden    Patient Care Team: [name], MD as PCP - General (Pediatrics) Deboraha Sprang, MD as Consulting Physician (Cardiology)    Assessment:  This is a routine wellness examination for [name]. The patient was informed that the wellness visit is to identify future health risk and educate and initiate measures that can reduce risk for increased disease through the lifespan.  Exercise Activities and Dietary recommendations Current Exercise Habits: Structured exercise class, Time (Minutes): 45, Frequency (Times/Week): 4, Weekly Exercise (Minutes/Week): 180, Intensity: Mild, Exercise limited by: orthopedic condition(s)  Goals      Patient Stated   . Exercise 4x per week (45) min per time) (pt-stated)     Patient has been doing this and hopes to continue doing this over next year.  Hubbard Hartshorn, RN, BSN        Other   . Patient Stated     Maintain current health.       Fall Risk Fall Risk  12/24/2017 05/26/2017 11/24/2016 06/11/2016 02/26/2016  Falls in the past year? No No No No No  Risk for fall due to : - - - - -   Is the patient's home free of loose throw rugs in walkways, pet beds, electrical cords, etc?   yes      Grab bars in the bathroom? yes      Handrails on the stairs?   yes      Adequate lighting?   yes  Timed Get Up and Go performed: 6 seconds, uses cane  Depression Screen PHQ 2/9 Scores 12/24/2017 05/26/2017 11/24/2016 06/12/2016  PHQ -  2 Score 0 0 0 0     Cognitive Function MMSE - Mini Mental State Exam 12/24/2017 01/10/2013 08/20/2011 07/22/2010  Orientation to time 5 5 5 5   Orientation to Place 5 5 5 5   Registration 3 3 3 3   Attention/ Calculation 5 5 5 5   Recall 3 2 2 3   Language- name 2  objects 2 2 2 2   Language- repeat 1 1 1 1   Language- follow 3 step command 3 3 3 3   Language- read & follow direction 1 1 1 1   Write a sentence 1 1 1 1   Copy design 1 1 1 1   Total score 30 29 29 30      6CIT Screen 12/24/2017  What Year? 0 points  What month? 0 points  What time? 0 points  Count back from 20 0 points  Months in reverse 0 points  Repeat phrase 0 points  Total Score 0    Immunization History  Administered Date(s) Administered  . H1N1 04/12/2008  . Influenza Inj Mdck Quad With Preservative 01/01/2017  . Influenza Split 12/27/2010, 12/12/2011  . Influenza Whole 12/28/2008, 01/03/2010  . Influenza,inj,Quad PF,6+ Mos 01/10/2013, 01/04/2016  . Influenza,inj,Quad PF,6-35 Mos 01/01/2017  . Influenza-Unspecified 01/13/2015  . PPD Test 09/06/2014  . Pneumococcal Conjugate-13 07/07/2016, 11/24/2016  . Pneumococcal Polysaccharide-23 05/29/1997  . Td 11/29/2004  . Tdap 05/29/2017   All health maintenance is up to date.   Screening Tests Health Maintenance  Topic Date Due  . TETANUS/TDAP  05/30/2027  . INFLUENZA VACCINE  Completed  . DEXA SCAN  Completed  . PNA vac Low Risk Adult  Completed   Cancer Screenings: Lung: Low Dose CT Chest recommended if Age 61-80 years, 30 pack-year currently smoking OR have quit w/in 15years. Patient does not qualify. Breast:  Up to date on Mammogram? Yes  No longer done Up to date of Bone Density/Dexa? Yes No longer done  Colorectal: no longer done     Plan:  I have personally reviewed and noted the following in the patient's chart:   . Medical and social history . Use of alcohol, tobacco or illicit drugs  . Current medications and supplements . Functional ability and status . Nutritional status . Physical activity . Advanced directives . List of other physicians . Hospitalizations, surgeries, and ER visits in previous 12 months . Vitals . Screenings to include cognitive, depression, and falls . Referrals and  appointments  In addition, I have reviewed and discussed with patient certain preventive protocols, quality metrics, and best practice recommendations. A written personalized care plan for preventive services as well as general preventive health recommendations were provided to patient.    Esau Grew, RN  12/24/2017

## 2017-12-24 NOTE — Progress Notes (Signed)
FMTS ATTENDING NOTE Glender [place],MD I  Have reviewed their chart.  I agree with the resident's findings, assessment and care plan. F/U with PCP soon for further routine care. '

## 2017-12-28 ENCOUNTER — Telehealth: Payer: Self-pay | Admitting: Cardiology

## 2017-12-28 ENCOUNTER — Ambulatory Visit (INDEPENDENT_AMBULATORY_CARE_PROVIDER_SITE_OTHER): Payer: Medicare HMO | Admitting: *Deleted

## 2017-12-28 DIAGNOSIS — I428 Other cardiomyopathies: Secondary | ICD-10-CM | POA: Diagnosis not present

## 2017-12-28 NOTE — Telephone Encounter (Signed)
Spoke with pt and reminded pt of remote transmission that is due today. Pt verbalized understanding.   

## 2017-12-29 NOTE — Progress Notes (Signed)
Remote pacemaker transmission.   

## 2018-01-20 ENCOUNTER — Other Ambulatory Visit: Payer: Self-pay | Admitting: Internal Medicine

## 2018-01-20 NOTE — Telephone Encounter (Signed)
Outpatient Medication Detail    Disp Refills Start End   losartan (COZAAR) 25 MG tablet 15 tablet 0 01/20/2018    Sig - Route: Take 0.5 tablets (12.5 mg total) by mouth daily. Please make overdue appt with Dr. Caryl Comes for future refills. [phone]. 1st attempt. - Oral   Sent to pharmacy as: losartan (COZAAR) 25 MG tablet   Notes to Pharmacy: Please call us to make overdue appt with Dr. Caryl Comes for future refills. [phone]. 1st attempt.   E-Prescribing Status: Receipt confirmed by pharmacy (01/20/2018 1:36 PM EDT)   [place], [place]

## 2018-01-21 LAB — CUP PACEART REMOTE DEVICE CHECK
Battery Remaining Percentage: 71 %
Brady Statistic AP VP Percent: 1.6 %
Brady Statistic AS VP Percent: 91 %
Brady Statistic AS VS Percent: 1.8 %
Brady Statistic RA Percent Paced: 1 %
Date Time Interrogation Session: 20190930173235
Implantable Lead Implant Date: 20070712
Implantable Lead Implant Date: 20070712
Implantable Lead Location: 753858
Implantable Lead Location: 753860
Lead Channel Impedance Value: 450 Ohm
Lead Channel Pacing Threshold Amplitude: 0.75 V
Lead Channel Pacing Threshold Amplitude: 1 V
Lead Channel Pacing Threshold Pulse Width: 0.4 ms
Lead Channel Pacing Threshold Pulse Width: 0.4 ms
Lead Channel Pacing Threshold Pulse Width: 0.7 ms
Lead Channel Setting Pacing Amplitude: 2 V
Lead Channel Setting Pacing Amplitude: 2 V
Lead Channel Setting Sensing Sensitivity: 2 mV
MDC IDC LEAD IMPLANT DT: 20141229
MDC IDC LEAD LOCATION: 753859
MDC IDC MSMT BATTERY REMAINING LONGEVITY: 59 mo
MDC IDC MSMT BATTERY VOLTAGE: 2.92 V
MDC IDC MSMT LEADCHNL LV IMPEDANCE VALUE: 490 Ohm
MDC IDC MSMT LEADCHNL RA IMPEDANCE VALUE: 340 Ohm
MDC IDC MSMT LEADCHNL RA PACING THRESHOLD AMPLITUDE: 0.75 V
MDC IDC MSMT LEADCHNL RA SENSING INTR AMPL: 2.6 mV
MDC IDC MSMT LEADCHNL RV SENSING INTR AMPL: 12 mV
MDC IDC PG IMPLANT DT: 20141229
MDC IDC PG SERIAL: 7554613
MDC IDC SET LEADCHNL LV PACING PULSEWIDTH: 0.7 ms
MDC IDC SET LEADCHNL RV PACING AMPLITUDE: 2.5 V
MDC IDC SET LEADCHNL RV PACING PULSEWIDTH: 0.4 ms
MDC IDC STAT BRADY AP VS PERCENT: 1 %

## 2018-02-14 ENCOUNTER — Other Ambulatory Visit: Payer: Self-pay | Admitting: Family Medicine

## 2018-02-15 ENCOUNTER — Other Ambulatory Visit: Payer: Self-pay | Admitting: Internal Medicine

## 2018-03-17 ENCOUNTER — Other Ambulatory Visit: Payer: Self-pay | Admitting: Internal Medicine

## 2018-03-29 ENCOUNTER — Ambulatory Visit (INDEPENDENT_AMBULATORY_CARE_PROVIDER_SITE_OTHER): Payer: Medicare HMO

## 2018-03-29 DIAGNOSIS — R000 Tachycardia, unspecified: Secondary | ICD-10-CM

## 2018-03-29 DIAGNOSIS — I428 Other cardiomyopathies: Secondary | ICD-10-CM

## 2018-03-29 LAB — CUP PACEART REMOTE DEVICE CHECK
Battery Remaining Longevity: 44 mo
Brady Statistic AS VS Percent: 1.7 %
Date Time Interrogation Session: 20191230123003
Implantable Lead Location: 753858
Implantable Lead Location: 753859
Implantable Pulse Generator Implant Date: 20141229
Lead Channel Impedance Value: 290 Ohm
Lead Channel Impedance Value: 430 Ohm
Lead Channel Pacing Threshold Amplitude: 0.75 V
Lead Channel Pacing Threshold Amplitude: 1.125 V
Lead Channel Pacing Threshold Pulse Width: 0.4 ms
Lead Channel Pacing Threshold Pulse Width: 0.7 ms
Lead Channel Sensing Intrinsic Amplitude: 2.2 mV
Lead Channel Setting Pacing Amplitude: 2.125
Lead Channel Setting Pacing Amplitude: 2.5 V
Lead Channel Setting Pacing Pulse Width: 0.4 ms
Lead Channel Setting Sensing Sensitivity: 2 mV
MDC IDC LEAD IMPLANT DT: 20070712
MDC IDC LEAD IMPLANT DT: 20070712
MDC IDC LEAD IMPLANT DT: 20141229
MDC IDC LEAD LOCATION: 753860
MDC IDC MSMT BATTERY REMAINING PERCENTAGE: 56 %
MDC IDC MSMT BATTERY VOLTAGE: 2.89 V
MDC IDC MSMT LEADCHNL LV IMPEDANCE VALUE: 440 Ohm
MDC IDC MSMT LEADCHNL RA PACING THRESHOLD AMPLITUDE: 0.75 V
MDC IDC MSMT LEADCHNL RA PACING THRESHOLD PULSEWIDTH: 0.4 ms
MDC IDC MSMT LEADCHNL RV SENSING INTR AMPL: 7.9 mV
MDC IDC SET LEADCHNL LV PACING PULSEWIDTH: 0.7 ms
MDC IDC SET LEADCHNL RA PACING AMPLITUDE: 2 V
MDC IDC STAT BRADY AP VP PERCENT: 1.6 %
MDC IDC STAT BRADY AP VS PERCENT: 1 %
MDC IDC STAT BRADY AS VP PERCENT: 91 %
MDC IDC STAT BRADY RA PERCENT PACED: 1 %
Pulse Gen Model: 3242
Pulse Gen Serial Number: 7554613

## 2018-03-29 NOTE — Progress Notes (Signed)
Remote pacemaker transmission.   

## 2018-04-24 ENCOUNTER — Other Ambulatory Visit: Payer: Self-pay | Admitting: Family Medicine

## 2018-04-28 NOTE — Telephone Encounter (Signed)
Please ask patient to schedule follow up with me some time in the next month; she's due for yearly bloodwork to monitor her kidney function on lasix. Thanks [name], MD

## 2018-04-29 ENCOUNTER — Telehealth: Payer: Self-pay

## 2018-04-29 NOTE — Telephone Encounter (Signed)
Called and LVM for patient to call and schedule an appointment with Dr. [name].  [name], [name]

## 2018-05-10 ENCOUNTER — Other Ambulatory Visit: Payer: Self-pay

## 2018-05-10 ENCOUNTER — Ambulatory Visit (INDEPENDENT_AMBULATORY_CARE_PROVIDER_SITE_OTHER): Payer: Medicare HMO | Admitting: Family Medicine

## 2018-05-10 ENCOUNTER — Encounter: Payer: Self-pay | Admitting: Family Medicine

## 2018-05-10 VITALS — BP 104/62 | HR 96 | Temp 97.9°F | Ht 65.0 in | Wt 112.6 lb

## 2018-05-10 DIAGNOSIS — I10 Essential (primary) hypertension: Secondary | ICD-10-CM | POA: Diagnosis not present

## 2018-05-10 DIAGNOSIS — I5022 Chronic systolic (congestive) heart failure: Secondary | ICD-10-CM | POA: Diagnosis not present

## 2018-05-10 DIAGNOSIS — E785 Hyperlipidemia, unspecified: Secondary | ICD-10-CM

## 2018-05-10 NOTE — Patient Instructions (Signed)
Checking labs today  Follow up in 6 months, sooner if needed  Be well, Dr. [name]

## 2018-05-10 NOTE — Progress Notes (Signed)
Date of Visit: 05/10/2018   HPI:  Patient presents for routine follow up.  CHF - followed by cardiology. Takes lasix 40mg  daily as needed, taking just a few times per week overall. Limits how much lasix she takes because it is challenging to need to urinate often if she is away from home. No chest pain or shortness of breath (except notes mild shortness of breath if she waits too long to take lasix, improves when she takes it). Not taking losartan as she ran out and cardiology office said she needed another appointment before getting refills, but they had no appts when she called to schedule. She has been out for a few weeks and feels no different. She wonders if she should still take it. Plans to discuss w/ cardiology at next visit with them. Does also take coreg 3.125mg  twice daily.   She is fasting today and is agreeable to having lipids checked.  ROS: See HPI.  [place]: hyperlipidemia, hypertension, CHF, osteoporosis, urge incontinence, ICD in place  PHYSICAL EXAM: BP 104/62   Pulse 96   Temp 97.9 F (36.6 C) (Oral)   Ht 5\' 5"  (1.651 m)   Wt 112 lb 9.6 oz (51.1 kg)   SpO2 99%   BMI 18.74 kg/m  Gen: no acute distress, pleasant, cooperative, well appearing HEENT: normocephalic, atraumatic, moist mucous membranes  Heart: regular rate and rhythm, no murmur Lungs: clear to auscultation bilaterally, normal work of breathing  Neuro: alert, grossly nonfocal, speech normal Ext: 2+ edema bilateral lower extremities   ASSESSMENT/PLAN:  Health maintenance:  -current on HM items  Chronic systolic heart failure (HCC) Overall stable. Check renal function today for monitoring with lasix. Offered to refill losartan but patient prefers to wait and discuss with cardiology at next visit  Hyperlipidemia Update lipids today  HYPERTENSION, BENIGN SYSTEMIC Blood pressure low normal today. Patient doing well overall. Continue carvedilol.  FOLLOW UP: Follow up in 6 mos for routine medical  issues, sooner if needed.  [place]. [name], [place]

## 2018-05-11 LAB — LIPID PANEL
Chol/HDL Ratio: 2.4 ratio (ref 0.0–4.4)
Cholesterol, Total: 220 mg/dL — ABNORMAL HIGH (ref 100–199)
HDL: 92 mg/dL (ref >39)
LDL Calculated: 101 mg/dL — ABNORMAL HIGH (ref 0–99)
Triglycerides: 136 mg/dL (ref 0–149)
VLDL Cholesterol Cal: 27 mg/dL (ref 5–40)

## 2018-05-11 LAB — CMP14+EGFR
A/G RATIO: 1.6 (ref 1.2–2.2)
ALBUMIN: 4.2 g/dL (ref 3.6–4.6)
ALT: 9 IU/L (ref 0–32)
AST: 18 IU/L (ref 0–40)
Alkaline Phosphatase: 74 IU/L (ref 39–117)
BILIRUBIN TOTAL: 0.9 mg/dL (ref 0.0–1.2)
BUN/Creatinine Ratio: 16 (ref 12–28)
BUN: 12 mg/dL (ref 8–27)
CHLORIDE: 99 mmol/L (ref 96–106)
CO2: 24 mmol/L (ref 20–29)
Calcium: 9.6 mg/dL (ref 8.7–10.3)
Creatinine, Ser: 0.74 mg/dL (ref 0.57–1.00)
GFR calc Af Amer: 85 mL/min/{1.73_m2} (ref >59)
GFR calc non Af Amer: 74 mL/min/{1.73_m2} (ref >59)
GLUCOSE: 106 mg/dL — AB (ref 65–99)
Globulin, Total: 2.6 g/dL (ref 1.5–4.5)
Potassium: 4 mmol/L (ref 3.5–5.2)
Sodium: 138 mmol/L (ref 134–144)
TOTAL PROTEIN: 6.8 g/dL (ref 6.0–8.5)

## 2018-05-12 NOTE — Assessment & Plan Note (Signed)
Overall stable. Check renal function today for monitoring with lasix. Offered to refill losartan but patient prefers to wait and discuss with cardiology at next visit

## 2018-05-12 NOTE — Assessment & Plan Note (Signed)
Update lipids today

## 2018-05-12 NOTE — Assessment & Plan Note (Signed)
Blood pressure low normal today. Patient doing well overall. Continue carvedilol.

## 2018-05-14 ENCOUNTER — Telehealth: Payer: Self-pay | Admitting: Family Medicine

## 2018-05-14 ENCOUNTER — Encounter: Payer: Self-pay | Admitting: Family Medicine

## 2018-05-14 DIAGNOSIS — R739 Hyperglycemia, unspecified: Secondary | ICD-10-CM

## 2018-05-14 NOTE — Addendum Note (Signed)
Addended by: [name] on: 05/14/2018 03:38 PM   Modules accepted: Orders

## 2018-05-14 NOTE — Telephone Encounter (Signed)
Attempted to reach patient to discuss lab results. Specifically want to recommend we repeat her A1c as her glucose was a little high and her last A1c was 6.0.  No answer, no voicemail set up. Will send letter asking patient to contact us.  [name], MD

## 2018-05-24 ENCOUNTER — Other Ambulatory Visit (INDEPENDENT_AMBULATORY_CARE_PROVIDER_SITE_OTHER): Payer: Medicare HMO

## 2018-05-24 DIAGNOSIS — R739 Hyperglycemia, unspecified: Secondary | ICD-10-CM

## 2018-05-24 LAB — POCT GLYCOSYLATED HEMOGLOBIN (HGB A1C): Hemoglobin A1C: 6.1 % — AB (ref 4.0–5.6)

## 2018-05-27 ENCOUNTER — Encounter: Payer: Self-pay | Admitting: Family Medicine

## 2018-06-03 ENCOUNTER — Telehealth: Payer: Self-pay

## 2018-06-03 NOTE — Telephone Encounter (Signed)
Returned call to patient & answered her questions. Discussed lifestyle modifications to help prevent progression to diabetes. Patient appreciative.  [name], MD

## 2018-06-03 NOTE — Telephone Encounter (Signed)
Patient left message she received her lab letter. Wants to speak with PCP results.  Call back is [phone]  Danley Danker, RN [facility])

## 2018-06-15 ENCOUNTER — Encounter: Payer: Medicare HMO | Admitting: Internal Medicine

## 2018-06-28 ENCOUNTER — Other Ambulatory Visit: Payer: Self-pay

## 2018-06-28 ENCOUNTER — Ambulatory Visit (INDEPENDENT_AMBULATORY_CARE_PROVIDER_SITE_OTHER): Payer: Medicare HMO | Admitting: *Deleted

## 2018-06-28 DIAGNOSIS — I428 Other cardiomyopathies: Secondary | ICD-10-CM

## 2018-06-28 DIAGNOSIS — I5022 Chronic systolic (congestive) heart failure: Secondary | ICD-10-CM

## 2018-06-29 LAB — CUP PACEART REMOTE DEVICE CHECK
Battery Remaining Percentage: 44 %
Battery Voltage: 2.86 V
Brady Statistic AP VP Percent: 1.5 %
Brady Statistic AS VP Percent: 91 %
Brady Statistic AS VS Percent: 1.7 %
Brady Statistic RA Percent Paced: 1 %
Date Time Interrogation Session: 20200331051241
Implantable Lead Implant Date: 20070712
Implantable Lead Implant Date: 20070712
Implantable Lead Location: 753858
Implantable Lead Location: 753859
Implantable Lead Location: 753860
Implantable Pulse Generator Implant Date: 20141229
Lead Channel Impedance Value: 290 Ohm
Lead Channel Impedance Value: 430 Ohm
Lead Channel Impedance Value: 450 Ohm
Lead Channel Pacing Threshold Amplitude: 0.75 V
Lead Channel Pacing Threshold Amplitude: 0.75 V
Lead Channel Pacing Threshold Amplitude: 1.625 V
Lead Channel Pacing Threshold Pulse Width: 0.4 ms
Lead Channel Pacing Threshold Pulse Width: 0.4 ms
Lead Channel Pacing Threshold Pulse Width: 0.7 ms
Lead Channel Sensing Intrinsic Amplitude: 3.1 mV
Lead Channel Setting Pacing Amplitude: 2 V
Lead Channel Setting Pacing Amplitude: 2.5 V
Lead Channel Setting Pacing Amplitude: 2.625
Lead Channel Setting Pacing Pulse Width: 0.4 ms
Lead Channel Setting Pacing Pulse Width: 0.7 ms
Lead Channel Setting Sensing Sensitivity: 2 mV
MDC IDC LEAD IMPLANT DT: 20141229
MDC IDC MSMT BATTERY REMAINING LONGEVITY: 34 mo
MDC IDC MSMT LEADCHNL RV SENSING INTR AMPL: 12 mV
MDC IDC STAT BRADY AP VS PERCENT: 1 %
Pulse Gen Model: 3242
Pulse Gen Serial Number: 7554613

## 2018-07-07 NOTE — Progress Notes (Signed)
Remote pacemaker transmission.   

## 2018-07-20 ENCOUNTER — Other Ambulatory Visit: Payer: Self-pay | Admitting: Family Medicine

## 2018-08-03 ENCOUNTER — Other Ambulatory Visit: Payer: Self-pay | Admitting: Family Medicine

## 2018-08-12 ENCOUNTER — Telehealth: Payer: Self-pay

## 2018-08-12 NOTE — Telephone Encounter (Signed)
I called and left patient a message about switching over 08/16/18 office visit to telehealth visit.

## 2018-08-13 NOTE — Telephone Encounter (Signed)
Patient cancelled appointment on 08/16/18 and rescheduled to 11/09/18.

## 2018-08-16 ENCOUNTER — Encounter: Payer: Medicare HMO | Admitting: Internal Medicine

## 2018-08-30 ENCOUNTER — Other Ambulatory Visit: Payer: Self-pay

## 2018-08-30 ENCOUNTER — Telehealth (INDEPENDENT_AMBULATORY_CARE_PROVIDER_SITE_OTHER): Payer: Medicare HMO | Admitting: Family Medicine

## 2018-08-30 DIAGNOSIS — G629 Polyneuropathy, unspecified: Secondary | ICD-10-CM | POA: Diagnosis not present

## 2018-08-30 DIAGNOSIS — M79604 Pain in right leg: Secondary | ICD-10-CM

## 2018-08-30 DIAGNOSIS — M79605 Pain in left leg: Secondary | ICD-10-CM

## 2018-08-30 MED ORDER — BACLOFEN 10 MG PO TABS: 5.0000 mg | ORAL_TABLET | Freq: Every day | ORAL | 0 refills | Status: DC | PRN

## 2018-08-30 MED ORDER — FUROSEMIDE 80 MG PO TABS: 80.0000 mg | ORAL_TABLET | Freq: Every day | ORAL | 1 refills | Status: DC

## 2018-08-30 NOTE — Progress Notes (Signed)
[place] Telemedicine Visit  Patient consented to have virtual visit. Method of visit: Telephone  Encounter participants: Patient: [name] - located at home Provider: Chrisandra Netters - located at office Others (if applicable): n/a  Chief Complaint: pain and swelling in legs  HPI:  Patient presents to discuss pain and swelling in her legs. Has had nerve-like pain in her legs for years, but about 3 weeks ago got suddenly worse, along with swelling. Her left leg got swollen enough that it began weeping fluid. She was taking lasix 53m daily, but has tried giving herself 836mper day here and there over the last week. Is taking acetaminophen regularly. Has tingling in toes, and has sensation of feeling like her legs are cold (but they are warm when she touches them). Has hindered her ability to do things she wants to do, for example has trouble squatting down because her legs are so sore and has started using a walker to get around. She typically [facility] the senior center but has not been able to do that in 3 months because there have been no classes due to [place]. Has had no fevers, no shortness of breath, no redness. Entire legs are sore, more on the front than on the back on the calves. Swelling is present on both legs, though L leg seems a little tighter (per patient, that leg is more prone to swelling). Weighed herself at home and got 113lb, then after taking lasix 8076mubsequently 109lb. Swelling has improved some but pain persists.  ROS: per HPI  Pertinent PMHx: history of hyperlipidemia, hypertension, chronic systolic CHF, osteoporosis,   Exam:  Respiratory: Patient speaking normally in full sentences throughout the encounter, without any respiratory distress evident.   Assessment/Plan:  Leg pain and swelling Pain seems neuropathic in nature given tingling/subjectively cold sensation patient reports. Has had lab eval in the  past which was unremarkable. Patient's goals are to be comfortable and avoid medical encounters if possible, so she is less interested in a referral to neurology for nerve conduction studies or further workup. Is interested in trying medication. By history it does not seem consistent with bilateral DVTs or cellulitis but exam would be helpful. May be worsening of venous insufficiency. Less likely CHF given no resp symptoms. Patient agreeable to coming in for labwork. - Will do trial of low dose baclofen to see if this helps.  - increase lasix to 47m56mily as she has had good response with this when she's tried it at home - check CBC, CMET, BNP, ESR, B12 - Lab appointment scheduled tomorrow AM at which time I will also examine her legs.  Time spent during visit with patient: 21 minutes

## 2018-08-31 ENCOUNTER — Other Ambulatory Visit: Payer: Self-pay

## 2018-08-31 ENCOUNTER — Telehealth: Payer: Self-pay | Admitting: *Deleted

## 2018-08-31 ENCOUNTER — Other Ambulatory Visit: Payer: Medicare HMO

## 2018-08-31 DIAGNOSIS — R5381 Other malaise: Secondary | ICD-10-CM | POA: Diagnosis not present

## 2018-08-31 DIAGNOSIS — G629 Polyneuropathy, unspecified: Secondary | ICD-10-CM

## 2018-08-31 DIAGNOSIS — M7989 Other specified soft tissue disorders: Secondary | ICD-10-CM | POA: Diagnosis not present

## 2018-08-31 DIAGNOSIS — R6889 Other general symptoms and signs: Secondary | ICD-10-CM | POA: Diagnosis not present

## 2018-08-31 DIAGNOSIS — M2550 Pain in unspecified joint: Secondary | ICD-10-CM | POA: Diagnosis not present

## 2018-08-31 DIAGNOSIS — M79604 Pain in right leg: Secondary | ICD-10-CM

## 2018-08-31 DIAGNOSIS — M79605 Pain in left leg: Secondary | ICD-10-CM

## 2018-08-31 LAB — POCT SEDIMENTATION RATE: POCT SED RATE: 11 mm/hr (ref 0–22)

## 2018-08-31 NOTE — Telephone Encounter (Signed)
Pt states that she has reconsidered and would like to get the ultrasound on her legs.  Will forward to MD to advise.  Christen Bame, CMA

## 2018-09-01 LAB — CMP14+EGFR
ALT: 10 IU/L (ref 0–32)
AST: 21 IU/L (ref 0–40)
Albumin/Globulin Ratio: 2.1 (ref 1.2–2.2)
Albumin: 4.6 g/dL (ref 3.6–4.6)
Alkaline Phosphatase: 73 IU/L (ref 39–117)
BUN/Creatinine Ratio: 19 (ref 12–28)
BUN: 16 mg/dL (ref 8–27)
Bilirubin Total: 0.8 mg/dL (ref 0.0–1.2)
CO2: 25 mmol/L (ref 20–29)
Calcium: 9.7 mg/dL (ref 8.7–10.3)
Chloride: 95 mmol/L — ABNORMAL LOW (ref 96–106)
Creatinine, Ser: 0.85 mg/dL (ref 0.57–1.00)
GFR calc Af Amer: 72 mL/min/{1.73_m2} (ref >59)
GFR calc non Af Amer: 62 mL/min/{1.73_m2} (ref >59)
Globulin, Total: 2.2 g/dL (ref 1.5–4.5)
Glucose: 134 mg/dL — ABNORMAL HIGH (ref 65–99)
Potassium: 4.4 mmol/L (ref 3.5–5.2)
Sodium: 138 mmol/L (ref 134–144)
Total Protein: 6.8 g/dL (ref 6.0–8.5)

## 2018-09-01 LAB — CBC
Hematocrit: 42 % (ref 34.0–46.6)
Hemoglobin: 15 g/dL (ref 11.1–15.9)
MCH: 33.1 pg — ABNORMAL HIGH (ref 26.6–33.0)
MCHC: 35.7 g/dL (ref 31.5–35.7)
MCV: 93 fL (ref 79–97)
Platelets: 150 10*3/uL (ref 150–450)
RBC: 4.53 x10E6/uL (ref 3.77–5.28)
RDW: 12.4 % (ref 11.7–15.4)
WBC: 6.8 10*3/uL (ref 3.4–10.8)

## 2018-09-01 LAB — IRON AND TIBC
Iron Saturation: 38 % (ref 15–55)
Iron: 130 ug/dL (ref 27–139)
Total Iron Binding Capacity: 339 ug/dL (ref 250–450)
UIBC: 209 ug/dL (ref 118–369)

## 2018-09-01 LAB — TSH: TSH: 2.86 u[IU]/mL (ref 0.450–4.500)

## 2018-09-01 LAB — VITAMIN B12: Vitamin B-12: 383 pg/mL (ref 232–1245)

## 2018-09-01 NOTE — Telephone Encounter (Signed)
Pt scheduled an informed.Curran Lenderman Kennon Holter, CMA

## 2018-09-01 NOTE — Telephone Encounter (Signed)
Late entry. Saw patient in clinic on 6/2 AM when she was here for labwork. Patient had tried baclofen overnight with significant relief of discomfort.  Exam: Bilateral lower extremities with 2+ edema, no redness, no warmth. Diffusely tender to palpation, not in one area more than another. Patient ambulating with walker. Difficult to palpate DP pulses bilaterally but feet/toes warm. No palpable cords. No significant skin breakdown. Heart regular rate and rhythm, no murmur Lungs: clear to auscultation bilaterally , normal work of breathing   No signs of cellulitis. Lower suspicion for DVT given lack of warmth/redness on exam, though leg veins are more prominent (unclear of baseline though). Discussed options for evaluation with patient - including ABI to assess blood flow, neuro referral for evaluation, or doppler of legs to rule out DVT (advised I can't say 100% for sure she doesn't have DVT without getting this study). At time of visit patient preferred to forego further eval and just proceed with baclofen since it was giving her such relief. Advised patient to continue baclofen and lasix 80mg  daily.  It appears patient has now changed her mind and does want to rule out out DVT. Attempted to reach patient to discuss. No answer over phone. Order placed to get venous doppler of bilateral lower extremities Red team, please contact patient to schedule.  [name], MD

## 2018-09-02 ENCOUNTER — Ambulatory Visit (HOSPITAL_COMMUNITY)
Admission: RE | Admit: 2018-09-02 | Discharge: 2018-09-02 | Disposition: A | Discharge status: Home / Self Care | Payer: Medicare HMO | Source: Ambulatory Visit | Attending: Family Medicine | Admitting: Family Medicine

## 2018-09-02 ENCOUNTER — Other Ambulatory Visit: Payer: Self-pay

## 2018-09-02 DIAGNOSIS — M79604 Pain in right leg: Secondary | ICD-10-CM

## 2018-09-02 DIAGNOSIS — M79605 Pain in left leg: Secondary | ICD-10-CM | POA: Diagnosis not present

## 2018-09-03 ENCOUNTER — Encounter: Payer: Self-pay | Admitting: Family Medicine

## 2018-09-27 ENCOUNTER — Ambulatory Visit (INDEPENDENT_AMBULATORY_CARE_PROVIDER_SITE_OTHER): Payer: Medicare HMO | Admitting: *Deleted

## 2018-09-27 DIAGNOSIS — I428 Other cardiomyopathies: Secondary | ICD-10-CM | POA: Diagnosis not present

## 2018-09-28 ENCOUNTER — Telehealth: Payer: Self-pay

## 2018-09-28 NOTE — Telephone Encounter (Signed)
Spoke with patient to remind of missed remote transmission 

## 2018-09-29 LAB — CUP PACEART REMOTE DEVICE CHECK
Battery Remaining Longevity: 24 mo
Battery Remaining Percentage: 33 %
Battery Voltage: 2.83 V
Brady Statistic AP VP Percent: 1.5 %
Brady Statistic AP VS Percent: 1 %
Brady Statistic AS VP Percent: 91 %
Brady Statistic AS VS Percent: 1.7 %
Brady Statistic RA Percent Paced: 1 %
Date Time Interrogation Session: 20200630162741
Implantable Lead Implant Date: 20070712
Implantable Lead Implant Date: 20070712
Implantable Lead Implant Date: 20141229
Implantable Lead Location: 753858
Implantable Lead Location: 753859
Implantable Lead Location: 753860
Implantable Lead Model: 7020
Implantable Pulse Generator Implant Date: 20141229
Lead Channel Impedance Value: 300 Ohm
Lead Channel Impedance Value: 410 Ohm
Lead Channel Impedance Value: 440 Ohm
Lead Channel Pacing Threshold Amplitude: 0.75 V
Lead Channel Pacing Threshold Amplitude: 0.75 V
Lead Channel Pacing Threshold Amplitude: 1.75 V
Lead Channel Pacing Threshold Pulse Width: 0.4 ms
Lead Channel Pacing Threshold Pulse Width: 0.4 ms
Lead Channel Pacing Threshold Pulse Width: 0.7 ms
Lead Channel Sensing Intrinsic Amplitude: 12 mV
Lead Channel Sensing Intrinsic Amplitude: 3.7 mV
Lead Channel Setting Pacing Amplitude: 2 V
Lead Channel Setting Pacing Amplitude: 2.5 V
Lead Channel Setting Pacing Amplitude: 2.75 V
Lead Channel Setting Pacing Pulse Width: 0.4 ms
Lead Channel Setting Pacing Pulse Width: 0.7 ms
Lead Channel Setting Sensing Sensitivity: 2 mV
Pulse Gen Model: 3242
Pulse Gen Serial Number: 7554613

## 2018-09-30 ENCOUNTER — Other Ambulatory Visit: Payer: Self-pay | Admitting: Family Medicine

## 2018-10-08 ENCOUNTER — Encounter: Payer: Self-pay | Admitting: Cardiology

## 2018-10-08 NOTE — Progress Notes (Signed)
Remote pacemaker transmission.   

## 2018-10-20 DIAGNOSIS — Z0100 Encounter for examination of eyes and vision without abnormal findings: Secondary | ICD-10-CM | POA: Diagnosis not present

## 2018-10-20 DIAGNOSIS — H5200 Hypermetropia, unspecified eye: Secondary | ICD-10-CM | POA: Diagnosis not present

## 2018-11-07 ENCOUNTER — Emergency Department (HOSPITAL_COMMUNITY)
Admission: EM | Admit: 2018-11-07 | Discharge: 2018-11-07 | Disposition: A | Discharge status: Home / Self Care | Payer: Medicare HMO | Attending: Emergency Medicine | Admitting: Emergency Medicine

## 2018-11-07 ENCOUNTER — Emergency Department (HOSPITAL_COMMUNITY): Payer: Medicare HMO

## 2018-11-07 ENCOUNTER — Telehealth: Payer: Self-pay | Admitting: Family Medicine

## 2018-11-07 ENCOUNTER — Encounter (HOSPITAL_COMMUNITY): Payer: Self-pay | Admitting: *Deleted

## 2018-11-07 ENCOUNTER — Other Ambulatory Visit: Payer: Self-pay

## 2018-11-07 DIAGNOSIS — I5022 Chronic systolic (congestive) heart failure: Secondary | ICD-10-CM | POA: Diagnosis not present

## 2018-11-07 DIAGNOSIS — Z95811 Presence of heart assist device: Secondary | ICD-10-CM | POA: Diagnosis not present

## 2018-11-07 DIAGNOSIS — Z87891 Personal history of nicotine dependence: Secondary | ICD-10-CM | POA: Diagnosis not present

## 2018-11-07 DIAGNOSIS — R600 Localized edema: Secondary | ICD-10-CM | POA: Insufficient documentation

## 2018-11-07 DIAGNOSIS — R0602 Shortness of breath: Secondary | ICD-10-CM | POA: Insufficient documentation

## 2018-11-07 DIAGNOSIS — Z79899 Other long term (current) drug therapy: Secondary | ICD-10-CM | POA: Insufficient documentation

## 2018-11-07 DIAGNOSIS — R531 Weakness: Secondary | ICD-10-CM | POA: Diagnosis present

## 2018-11-07 DIAGNOSIS — R05 Cough: Secondary | ICD-10-CM | POA: Diagnosis not present

## 2018-11-07 DIAGNOSIS — I10 Essential (primary) hypertension: Secondary | ICD-10-CM | POA: Diagnosis not present

## 2018-11-07 DIAGNOSIS — Z20828 Contact with and (suspected) exposure to other viral communicable diseases: Secondary | ICD-10-CM | POA: Insufficient documentation

## 2018-11-07 DIAGNOSIS — R5383 Other fatigue: Secondary | ICD-10-CM | POA: Diagnosis not present

## 2018-11-07 DIAGNOSIS — R079 Chest pain, unspecified: Secondary | ICD-10-CM | POA: Diagnosis not present

## 2018-11-07 DIAGNOSIS — Z20822 Contact with and (suspected) exposure to covid-19: Secondary | ICD-10-CM

## 2018-11-07 LAB — SARS CORONAVIRUS 2 (TAT 6-24 HRS): SARS Coronavirus 2: NEGATIVE

## 2018-11-07 NOTE — Telephone Encounter (Signed)
**  After Hours/ Emergency Line Call**  Received a page to call [phone]) - [phone].  Patient: [name]  Caller: Self  Confirmed name & DOB of patient with caller  Subjective:  CC: "I think I have the 9"  Caller reports that it hurts to touch her skin, bones from waist down hurt and ache all over. Started Thursday night or Friday morning. Has fevers ("My fever was 99 point something, yesterday, 101 point something") and chills, short of breath, very little coughing, sneezing (but usual), and snotty nose. Endorsed dizziness, light headness. Denies sore throat,  Patient has hx of CHF, does not feel like she gains weight. This morning, 101 lbs which is normal for her. No positive contacts. Uses a cane to walk and can go to her backyard to see her tomato plants, which is the farthest she can walk while healthy. Symptoms quickly progressing over the last few days per patient. Patient notes that "I do not do sickness well. I want to feel better or die, in a joking manner.   Objective:  Observations: Pleasant. Can complete sentences but does sound breathless after sentence. Maintains a sense of humor and makes jokes over the phone. Daughter is in background. Patient alert and oriented x3.   Assessment & Plan  [name] is a 85 y.o. female with PMHx of CHF exacerbation who denies recent weight gain who calls with the following complaints and concerns: fever, body aches, and shortness of breath.    -- Recommendations: Go to the emergency room for further evaluation. Will be driven by daughter. -- Called ED to make aware. Patient only needs mask and daughter should stay in car.  -- Red flags discussed.     Wilber Oliphant, M.D.  PGY-2  [facility] 11/07/2018 8:22 AM

## 2018-11-07 NOTE — ED Notes (Signed)
Pt dc instructions discussed and pt states understanding. Home stable via wc. Home with daughter.

## 2018-11-07 NOTE — ED Provider Notes (Signed)
[facility] EMERGENCY DEPARTMENT Provider Note   CSN: [id number] Arrival date & time: 11/07/18  0903    History   Chief Complaint Chief Complaint  Patient presents with  . generalized pain/ weakness    HPI [name] is a 85 y.o. female with PMH hypertension, hyperlipidemia, PAF, HFrEF EF 25-30% in 2017, has pacemaker/ICD  History provided by patient.  Patient states that for the last 4 to 5 days, she has been having progressive fatigue, whole body aches.  She states that she feels as though she cannot walk as far she used to without getting tired.  She adamantly denies worsening in chronic shortness of breath or dyspnea on exertion.  She denies chest pain.  States that she feels as though her entire body hurts.  Notes that she had a temp oral temperature of 101 last p.m.  Does not know of any other fevers.  States that she checked her temperature this morning, but does not remember what it was, does not think it was a fever.  She notes an occasional cough, that is chronic, but no worsening in this.  She denies congestion, rhinorrhea.  She reports that she has not had a change in her weight.  She has not noticed a worsening in her bilateral lower extremity edema.  She denies known sick contacts, but states that she has been going to the grocery and other places to run errands.  States that she lives at home, and cares for her special needs son.  She notes "I am worried about the 48, I want that test that the president and the famous athletes are getting."  She also states "I just want to feel better, I am just so tired."  Patient called After Hours Line at PCP office this AM and was advised to go to ED.  Past Medical History:  Diagnosis Date  . Biventricular ICD (implantable cardioverter-defibrillator)-St. Jude    Date of implant 2007 generator change 2012  . Hyperlipidemia   . Hypertension   . Macro dislodgment of the left ventricular lead 09/09/2012  . Nonischemic  cardiomyopathy ([place])    Cath without coronary disease 2006  . Osteoporosis   . PAF (paroxysmal atrial fibrillation) (HCC)    Short bursts of PAF. Asymptomatic.  Sees Dr Rex Kras.  . Peripheral neuropathy, hereditary/idiopathic 2012  . Raynaud phenomenon   . Sinus tachycardia   . Systolic CHF ([place])   . Urge incontinence     Patient Active Problem List   Diagnosis Date Noted  . Prediabetes 06/25/2016  . Influenza 06/19/2016  . Acute exacerbation of CHF (congestive heart failure) ([place]) 06/11/2016  . Leg wound, left 10/31/2015  . UTI (urinary tract infection) 01/17/2015  . Pes anserine bursitis 06/16/2014  . Atrial fibrillation ([place]) 12/27/2013  . Peripheral neuropathy 10/03/2013  . PVC's (premature ventricular contractions) 01/12/2013  . Macro dislodgment of the left ventricular lead 09/09/2012  . Biventricular ICD (implantable cardioverter-defibrillator)-St. Jude   . Nonischemic cardiomyopathy ([place])   . Sinus tachycardia   . Mole (skin) 06/08/2011  . Left knee pain 04/11/2011  . Fatigue 03/11/2010  . Hyperlipidemia 05/28/2006  . HYPERTENSION, BENIGN SYSTEMIC 05/28/2006  . Chronic systolic heart failure ([place]) 05/28/2006  . Osteoporosis 05/28/2006  . INCONTINENCE, URGE 05/28/2006    Past Surgical History:  Procedure Laterality Date  . ABDOMINAL HYSTERECTOMY  1970s  . APPENDECTOMY    . BI-VENTRICULAR PACEMAKER INSERTION N/A 03/28/2013   Procedure: BI-VENTRICULAR PACEMAKER INSERTION (CRT-P);  Surgeon: Revonda Standard  Caryl Comes, MD;  Location: [facility] C[facility] LAB;  Service: Cardiovascular;  Laterality: N/A;  . Biventricular ICD Implant  06/28/2010; 03/28/2013   STJ CRTD implanted 2012 with dislodgement of LV lead 2014; 03/28/2013 CRTP placed with new LV lead by Dr Caryl Comes  . Bladder tack    . CARDIAC CATHETERIZATION  03/2005   EF 25%, Normal arteries, Nonischemic CM  . HIP ARTHROPLASTY Left 12/20/2013   Procedure: LEFT HIP HEMI ARTHROPLASTY;  Surgeon: Meredith Pel, MD;  Location: [facility];   Service: Orthopedics;  Laterality: Left;  . LEFT AND RIGHT HEART CATHETERIZATION WITH CORONARY ANGIOGRAM N/A 12/13/2012   Procedure: LEFT AND RIGHT HEART CATHETERIZATION WITH CORONARY ANGIOGRAM;  Surgeon: Blane Ohara, MD;  Location: [facility] CATH LAB;  Service: Cardiovascular;  Laterality: N/A;  . NM MYOCAR PERF WALL MOTION  03/19/2010   EF 63%, low risk  . Suprapubic Urethrovesicular Suspension  1992  . TOTAL HIP ARTHROPLASTY Right 09/04/2014   Procedure: BIPOLAR HIP ARTHROPLASTY ANTERIOR APPROACH;  Surgeon: Mcarthur Rossetti, MD;  Location: [facility];  Service: Orthopedics;  Laterality: Right;  . US ECHOCARDIOGRAPHY  11/18/2011   EF 40%, LA moderately dilated, mild AI     OB History   No obstetric history on file.      Home Medications    Prior to Admission medications   Medication Sig Start Date End Date Taking? Authorizing Provider  acetaminophen (TYLENOL) 650 MG CR tablet Take 650 mg by mouth every 8 (eight) hours as needed for pain.    [provider]  baclofen (LIORESAL) 10 MG tablet TAKE 1/2 TABLET(5 MG) BY MOUTH DAILY AS NEEDED FOR PAIN 09/30/18   [name], MD  carvedilol (COREG) 3.125 MG tablet TAKE 1 TABLET BY MOUTH TWICE DAILY WITH MEALS 08/03/18   Martyn Malay, MD  Cranberry (SM CRANBERRY) 300 MG tablet Take 300 mg by mouth 2 (two) times daily.     [provider]  FLUAD 0.5 ML SUSY ADM 0.5ML IM UTD 11/13/17   [provider]  furosemide (LASIX) 80 MG tablet Take 1 tablet (80 mg total) by mouth daily. 08/30/18   [name], MD  losartan (COZAAR) 25 MG tablet TAKE 1/2 TABLET BY MOUTH DAILY 03/17/18   Deboraha Sprang, MD    Family History Family History  Problem Relation Age of Onset  . Heart disease Mother   . Heart disease Father   . Cancer Father        Kidney  . Arthritis Sister   . Heart disease Brother   . Diabetes Son   . Mental retardation Son   . Alcohol abuse Sister   . Cancer Brother   . Cancer Daughter   .  Heart disease Daughter     Social History Social History   Tobacco Use  . Smoking status: Former Smoker    Packs/day: 1.00    Years: 15.00    Pack years: 15.00    Quit date: 04/22/2002    Years since quitting: 16.5  . Smokeless tobacco: Never Used  Substance Use Topics  . 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 Quit 1979.  . Drug use: No    Comment: Prescription drug addict x 41 years.     Allergies   Sulfamethoxazole   Review of Systems Review of Systems  Constitutional: Positive for activity change, fatigue and fever. Negative for appetite change, chills, diaphoresis and unexpected weight change.  HENT: Negative for congestion, rhinorrhea  and sore throat.   Respiratory: Positive for cough (chronic) and shortness of breath (chronic).   Cardiovascular: Positive for leg swelling (chronic, stable per pt). Negative for chest pain.  Gastrointestinal: Negative for abdominal pain, blood in stool, constipation, diarrhea, nausea and vomiting.  Genitourinary: Negative for difficulty urinating, dysuria, frequency, hematuria and urgency.  Musculoskeletal: Positive for myalgias.  Skin: Negative for rash.  Neurological: Negative for dizziness, weakness, numbness and headaches.     Physical Exam Updated Vital Signs BP (!) 116/47   Pulse 73   Temp 99 F (37.2 C) (Oral)   Resp 20   Ht 5\' 3"  (1.6 m)   Wt 44.9 kg   SpO2 97%   BMI 17.54 kg/m   Physical Exam Constitutional:      Comments: Thin, elderly female  HENT:     Head: Normocephalic and atraumatic.  Eyes:     Extraocular Movements: Extraocular movements intact.     Pupils: Pupils are equal, round, and reactive to light.  Neck:     Musculoskeletal: Normal range of motion.  Cardiovascular:     Rate and Rhythm: Normal rate and regular rhythm.     Heart sounds: No murmur.  Pulmonary:     Effort: Pulmonary effort is normal.     Breath sounds: Normal breath sounds. No wheezing, rhonchi or rales.   Abdominal:     General: Abdomen is flat. Bowel sounds are normal.     Palpations: Abdomen is soft.     Tenderness: There is no abdominal tenderness.  Musculoskeletal:        General: Tenderness (LLE, chronic, stable per pt) present.     Right lower leg: Edema (2+, chornic and stable per pt) present.     Left lower leg: Edema (2+, chronic stable per pt) present.  Skin:    General: Skin is warm and dry.     Findings: Erythema (mild erythema to LLE) present.  Neurological:     General: No focal deficit present.     Mental Status: She is alert and oriented to person, place, and time. Mental status is at baseline.     Motor: No weakness.  Psychiatric:        Mood and Affect: Mood normal.        Behavior: Behavior normal.     Comments: Joking during examination      ED Treatments / Results  Labs (all labs ordered are listed, but only abnormal results are displayed) Labs Reviewed  SARS CORONAVIRUS 2    EKG None  Radiology Dg Chest 2 View  Result Date: 11/07/2018 CLINICAL DATA:  85 year old female with history of generalized weakness and pain for the past 3 days. EXAM: CHEST - 2 VIEW COMPARISON:  Chest x-ray 09/04/2014. FINDINGS: Lung volumes are normal. No consolidative airspace disease. No pleural effusions. No evidence of pulmonary edema. Mild cardiomegaly. Upper mediastinal contours are within normal limits. Aortic atherosclerosis. Left-sided biventricular pacemaker/AICD incidentally noted. IMPRESSION: 1. No radiographic evidence of acute cardiopulmonary disease. 2. Aortic atherosclerosis. 3. Mild cardiomegaly. Electronically Signed   By: Vinnie Langton M.D.   On: 11/07/2018 10:34    Procedures Procedures (including critical care time)  Medications Ordered in ED Medications - No data to display   Initial Impression / Assessment and Plan / ED Course  I have reviewed the triage vital signs and the nursing notes.  Pertinent labs & imaging results that were available during  my care of the patient were reviewed by me and considered in my  medical decision making (see chart for details).  CYNDA SOULE is an 85 year old female with past medical history of hypertension, hyperlipidemia, HFrEF with EF 25-30%, ICD, who presents with fatigue, body aches, reported fever to 101 last p.m.  Vital signs are stable, patient is afebrile in the ED.  Blood pressure mildly reduced, on chart review, patient has had blood pressures in the same range at previous office visits with PCP.  She denies any changes in breathing, no shortness of breath, no chest pain, chest x-ray without acute changes.  Low suspicion for ACS.  Given patient's fever, body aches, will test for COVID-19, she is tier 2, and will get sent out as she does not have respiratory symptoms, patient made aware of this, she agrees. No evidence of CHF exacerbation on exam, patient has not gained weight, lower extremity swelling is chronic and stable per her report, chest x-ray without evidence of pulmonary edema.  Patient does have mild erythema and tenderness palpation of her left lower extremity, she had lower extremity venous Dopplers performed on 09/03/2018 that were negative for DVT due to this, patient states stable since then, therefore would not repeat.  Offered patient CMP and CBC to further assess fatigue, but she declines.  Patient did have recent lab work in June 2020, including CBC and CMP, without acute abnormalities.  Will obtain COVID 19 test as send out, patient to follow-up with PCP this week regarding fatigue.  She was given appropriate return precautions, including worsening of fatigue, weakness, worsening shortness of breath, chest pain, inability to tolerate p.o.  She voiced understanding.  Patient to be discharged home.    Final Clinical Impressions(s) / ED Diagnoses   Final diagnoses:  Suspected Covid-19 Virus Infection  Fatigue, unspecified type    ED Discharge Orders    None       Cleophas Dunker,  DO 11/07/18 [place], MD 11/07/18 2133

## 2018-11-07 NOTE — Discharge Instructions (Signed)
You were seen in the emergency room for feeling tired and concern for COVID-19.  We have tested you.  Your test will come back in the next few days.  Until then, you should avoid going outside of the home.  Try to limit your contact with household members as much as possible, perform frequent handwashing.  Should you have worsening shortness of breath, chest pain, worsening weakness that does not allow you to walk or stand, you should return to care.  Please follow-up with your primary doctor within the next week.

## 2018-11-07 NOTE — ED Triage Notes (Signed)
Patient complains of generalized weakness and pain x 3 days. States that she wants to be tested for Covid- intermittent nausea. Patient alert and oriented on arrival. NAD

## 2018-11-09 ENCOUNTER — Telehealth: Payer: Self-pay | Admitting: *Deleted

## 2018-11-09 ENCOUNTER — Encounter: Payer: Medicare HMO | Admitting: Internal Medicine

## 2018-11-09 NOTE — Telephone Encounter (Signed)
Pt is calling for results of her Covid test and wants to know "what is going on with her".  Christen Bame, CMA

## 2018-11-10 NOTE — Telephone Encounter (Signed)
Returned call to patient. Informed of negative COVID test from ED visit. Unfortunately she did not get any other labwork done (patient declined other labs at time of ED visit). Reports nausea, body aches, fatigue since Wednesday. Highest her temp has been is 100. Has some mild shortness of breath but that is typical for her (prior smoker).  I am concerned for some other pathology (UTI? Anemia?) and think she needs further evaluation and labwork. Have scheduled her to see me tomorrow in [facility] clinic at 11am. Will plan on clinical exam and labs including UA. Patient appreciative.  [name], MD

## 2018-11-11 ENCOUNTER — Ambulatory Visit (INDEPENDENT_AMBULATORY_CARE_PROVIDER_SITE_OTHER): Payer: Medicare HMO | Admitting: Family Medicine

## 2018-11-11 ENCOUNTER — Encounter: Payer: Self-pay | Admitting: Family Medicine

## 2018-11-11 ENCOUNTER — Other Ambulatory Visit: Payer: Self-pay

## 2018-11-11 ENCOUNTER — Inpatient Hospital Stay (HOSPITAL_COMMUNITY)
Admission: AD | Admit: 2018-11-11 | Discharge: 2018-11-12 | DRG: 690 | Disposition: A | Discharge status: Home / Self Care | Payer: Medicare HMO | Source: Ambulatory Visit | Attending: Family Medicine | Admitting: Family Medicine

## 2018-11-11 VITALS — BP 98/62 | HR 88 | Wt 106.6 lb

## 2018-11-11 DIAGNOSIS — Z87891 Personal history of nicotine dependence: Secondary | ICD-10-CM

## 2018-11-11 DIAGNOSIS — I110 Hypertensive heart disease with heart failure: Secondary | ICD-10-CM | POA: Diagnosis present

## 2018-11-11 DIAGNOSIS — Z833 Family history of diabetes mellitus: Secondary | ICD-10-CM | POA: Diagnosis not present

## 2018-11-11 DIAGNOSIS — Z20828 Contact with and (suspected) exposure to other viral communicable diseases: Secondary | ICD-10-CM | POA: Diagnosis not present

## 2018-11-11 DIAGNOSIS — M810 Age-related osteoporosis without current pathological fracture: Secondary | ICD-10-CM | POA: Diagnosis present

## 2018-11-11 DIAGNOSIS — Z8249 Family history of ischemic heart disease and other diseases of the circulatory system: Secondary | ICD-10-CM | POA: Diagnosis not present

## 2018-11-11 DIAGNOSIS — Z96641 Presence of right artificial hip joint: Secondary | ICD-10-CM | POA: Diagnosis present

## 2018-11-11 DIAGNOSIS — Z90710 Acquired absence of both cervix and uterus: Secondary | ICD-10-CM

## 2018-11-11 DIAGNOSIS — R197 Diarrhea, unspecified: Secondary | ICD-10-CM | POA: Diagnosis present

## 2018-11-11 DIAGNOSIS — Z87440 Personal history of urinary (tract) infections: Secondary | ICD-10-CM

## 2018-11-11 DIAGNOSIS — Z66 Do not resuscitate: Secondary | ICD-10-CM | POA: Diagnosis not present

## 2018-11-11 DIAGNOSIS — N390 Urinary tract infection, site not specified: Secondary | ICD-10-CM | POA: Diagnosis not present

## 2018-11-11 DIAGNOSIS — R5383 Other fatigue: Secondary | ICD-10-CM | POA: Diagnosis present

## 2018-11-11 DIAGNOSIS — R5382 Chronic fatigue, unspecified: Secondary | ICD-10-CM

## 2018-11-11 DIAGNOSIS — Z882 Allergy status to sulfonamides status: Secondary | ICD-10-CM

## 2018-11-11 DIAGNOSIS — Z95810 Presence of automatic (implantable) cardiac defibrillator: Secondary | ICD-10-CM

## 2018-11-11 DIAGNOSIS — N3000 Acute cystitis without hematuria: Secondary | ICD-10-CM | POA: Diagnosis not present

## 2018-11-11 DIAGNOSIS — I5022 Chronic systolic (congestive) heart failure: Secondary | ICD-10-CM | POA: Diagnosis present

## 2018-11-11 DIAGNOSIS — Z79899 Other long term (current) drug therapy: Secondary | ICD-10-CM

## 2018-11-11 DIAGNOSIS — I428 Other cardiomyopathies: Secondary | ICD-10-CM | POA: Diagnosis present

## 2018-11-11 DIAGNOSIS — R52 Pain, unspecified: Secondary | ICD-10-CM

## 2018-11-11 LAB — COMPREHENSIVE METABOLIC PANEL
ALT: 18 U/L (ref 0–44)
AST: 19 U/L (ref 15–41)
Albumin: 2.7 g/dL — ABNORMAL LOW (ref 3.5–5.0)
Alkaline Phosphatase: 66 U/L (ref 38–126)
Anion gap: 13 (ref 5–15)
BUN: 14 mg/dL (ref 8–23)
CO2: 30 mmol/L (ref 22–32)
Calcium: 8.7 mg/dL — ABNORMAL LOW (ref 8.9–10.3)
Chloride: 91 mmol/L — ABNORMAL LOW (ref 98–111)
Creatinine, Ser: 0.86 mg/dL (ref 0.44–1.00)
GFR calc Af Amer: >60 mL/min (ref >60)
GFR calc non Af Amer: >60 mL/min (ref >60)
Glucose, Bld: 138 mg/dL — ABNORMAL HIGH (ref 70–99)
Potassium: 3.3 mmol/L — ABNORMAL LOW (ref 3.5–5.1)
Sodium: 134 mmol/L — ABNORMAL LOW (ref 135–145)
Total Bilirubin: 0.7 mg/dL (ref 0.3–1.2)
Total Protein: 6.1 g/dL — ABNORMAL LOW (ref 6.5–8.1)

## 2018-11-11 LAB — CBC WITH DIFFERENTIAL/PLATELET
Abs Immature Granulocytes: 0.13 10*3/uL — ABNORMAL HIGH (ref 0.00–0.07)
Basophils Absolute: 0 10*3/uL (ref 0.0–0.1)
Basophils Relative: 0 %
Eosinophils Absolute: 0.1 10*3/uL (ref 0.0–0.5)
Eosinophils Relative: 1 %
HCT: 42.2 % (ref 36.0–46.0)
Hemoglobin: 14.5 g/dL (ref 12.0–15.0)
Immature Granulocytes: 1 %
Lymphocytes Relative: 10 %
Lymphs Abs: 1.2 10*3/uL (ref 0.7–4.0)
MCH: 32.7 pg (ref 26.0–34.0)
MCHC: 34.4 g/dL (ref 30.0–36.0)
MCV: 95 fL (ref 80.0–100.0)
Monocytes Absolute: 1 10*3/uL (ref 0.1–1.0)
Monocytes Relative: 8 %
Neutro Abs: 9.6 10*3/uL — ABNORMAL HIGH (ref 1.7–7.7)
Neutrophils Relative %: 80 %
Platelets: 219 10*3/uL (ref 150–400)
RBC: 4.44 MIL/uL (ref 3.87–5.11)
RDW: 13.2 % (ref 11.5–15.5)
WBC: 12 10*3/uL — ABNORMAL HIGH (ref 4.0–10.5)
nRBC: 0 % (ref 0.0–0.2)

## 2018-11-11 LAB — URINALYSIS, ROUTINE W REFLEX MICROSCOPIC
Bilirubin Urine: NEGATIVE
Glucose, UA: NEGATIVE mg/dL
Ketones, ur: NEGATIVE mg/dL
Nitrite: POSITIVE — AB
Protein, ur: NEGATIVE mg/dL
Specific Gravity, Urine: 1.008 (ref 1.005–1.030)
pH: 5 (ref 5.0–8.0)

## 2018-11-11 LAB — SEDIMENTATION RATE: Sed Rate: 54 mm/hr — ABNORMAL HIGH (ref 0–22)

## 2018-11-11 LAB — SARS CORONAVIRUS 2 BY RT PCR (HOSPITAL ORDER, PERFORMED IN ~~LOC~~ HOSPITAL LAB): SARS Coronavirus 2: NEGATIVE

## 2018-11-11 LAB — BRAIN NATRIURETIC PEPTIDE: B Natriuretic Peptide: 1885 pg/mL — ABNORMAL HIGH (ref 0.0–100.0)

## 2018-11-11 MED ORDER — ACETAMINOPHEN 325 MG PO TABS: 650.0000 mg | ORAL_TABLET | Freq: Four times a day (QID) | ORAL | Status: DC | PRN

## 2018-11-11 MED ORDER — CEPHALEXIN 500 MG PO CAPS: 500.0000 mg | ORAL_CAPSULE | Freq: Two times a day (BID) | ORAL | Status: DC

## 2018-11-11 MED ORDER — SODIUM CHLORIDE 0.9% FLUSH
3.0000 mL | Freq: Two times a day (BID) | INTRAVENOUS | Status: DC
  Administered 2018-11-11 – 2018-11-12 (×3): 3 mL via INTRAVENOUS

## 2018-11-11 MED ORDER — ACETAMINOPHEN 650 MG RE SUPP: 650.0000 mg | Freq: Four times a day (QID) | RECTAL | Status: DC | PRN

## 2018-11-11 MED ORDER — SODIUM CHLORIDE 0.9% FLUSH
3.0000 mL | Freq: Two times a day (BID) | INTRAVENOUS | Status: DC
  Administered 2018-11-11: 3 mL via INTRAVENOUS

## 2018-11-11 MED ORDER — SODIUM CHLORIDE 0.9% FLUSH: 3.0000 mL | INTRAVENOUS | Status: DC | PRN

## 2018-11-11 MED ORDER — POTASSIUM CHLORIDE CRYS ER 20 MEQ PO TBCR
40.0000 meq | EXTENDED_RELEASE_TABLET | Freq: Once | ORAL | Status: AC
  Administered 2018-11-11: 40 meq via ORAL
  Filled 2018-11-11: qty 2

## 2018-11-11 MED ORDER — CARVEDILOL 3.125 MG PO TABS
3.1250 mg | ORAL_TABLET | Freq: Two times a day (BID) | ORAL | Status: DC
  Administered 2018-11-11 – 2018-11-12 (×2): 3.125 mg via ORAL
  Filled 2018-11-11 (×2): qty 1

## 2018-11-11 MED ORDER — HEPARIN SODIUM (PORCINE) 5000 UNIT/ML IJ SOLN
5000.0000 [IU] | Freq: Three times a day (TID) | INTRAMUSCULAR | Status: DC
  Administered 2018-11-11 – 2018-11-12 (×3): 5000 [IU] via SUBCUTANEOUS
  Filled 2018-11-11 (×3): qty 1

## 2018-11-11 MED ORDER — SODIUM CHLORIDE 0.9 % IV SOLN: 250.0000 mL | INTRAVENOUS | Status: DC | PRN

## 2018-11-11 MED ORDER — SODIUM CHLORIDE 0.9 % IV SOLN
1.0000 g | INTRAVENOUS | Status: DC
  Administered 2018-11-11: 1 g via INTRAVENOUS
  Filled 2018-11-11: qty 10
  Filled 2018-11-11: qty 1

## 2018-11-11 MED ORDER — FUROSEMIDE 80 MG PO TABS
80.0000 mg | ORAL_TABLET | Freq: Every day | ORAL | Status: DC
  Administered 2018-11-12: 80 mg via ORAL
  Filled 2018-11-11: qty 1

## 2018-11-11 MED ORDER — BACLOFEN 5 MG HALF TABLET: 5.0000 mg | ORAL_TABLET | Freq: Two times a day (BID) | ORAL | Status: DC | PRN

## 2018-11-11 MED ORDER — BOOST / RESOURCE BREEZE PO LIQD CUSTOM
1.0000 | Freq: Three times a day (TID) | ORAL | Status: DC
  Administered 2018-11-11 – 2018-11-12 (×3): 1 via ORAL

## 2018-11-11 NOTE — H&P (Signed)
[facility] Admission History and Physical Service Pager: [phone]  Patient name: [name] Medical record number: [id number] Date of birth: 23-Sep-1929 Age: 85 y.o. Gender: female  Primary Care Provider: [name], MD Consultants: none Code Status: DNR (confirmed with patient on admission) Preferred Emergency Contact: Daughter Benjamine Mola (goes by [place]) - [phone], patient gives permission for daughter to hear all details of care  Chief Complaint: fatigue and body aches, diarrhea  Assessment and Plan: [name] is a 85 y.o. female presenting with fatigue, body aches, and diarrhea . PMH is significant for chronic systolic CHF with ICD in place, osteoporosis, hyperlipidemia, hypertension.  # Fatigue/Body aches - differential diagnosis is broad and includes viral illness (COVID, viral gastroenteritis), UTI, intraabdominal process (diverticulitis), anemia, CHF exacerbation, among other etiologies. Recent negative COVID test 4 days ago with unremarkable CXR at that time. Patient taking minimal PO though appears hydrated at this time. Abdominal exam with mild tenderness diffusely. With diarrhea concern higher for viral etiology vs intraabdominal process. - admit to inpatient unit - labs: CBC with diff, CMET, UA with culture, BNP, rapid COVID, sed rate - EKG - hold on repeat CXR at this time given lack of significant respiratory symptoms, no hypoxia, and recent normal CXR - once renal function returns plan to obtain CT abdomen/pelvis (unless another more obvious etiology identified in other labs) - eventually will need physical therapy/OT assessments, though hold on this for now  # COVID PUI - being admitted to [place] floor until rule out complete - rapid COVID ordered - airborne & contact precautions until resulted  # Systolic CHF & hypertension - last echo in 2017 with EF 25-30%. Has biventricular ICD in place. Home medication of lasix 80mg   daily, carvedilol 3.125mg  twice daily, losartan 12.5mg  daily. Currently with significant lower extremity edema, otherwise no signs of volume overload. BP mildly soft at 98/62. - check BNP - continue carvedilol 3.125mg  twice daily - hold lasix & losartan until renal function returns - monitor volume status, strict I&O - would likely benefit from repeat echo this admission, wait until COVID test returns to order  # Osteoporosis - not on any medications for this at present (previously has declined them)  FEN/GI: clear liquid diet, advance as tolerated Prophylaxis: SQ heparin  Disposition: admit to inpatient unit, anticipate eventual discharge home  History of Present Illness:  [name] is a 85 y.o. female presenting with fatigue, body aches, weakness, and diarrhea. She is accompanied to clinic by her daughter Janean Sark.  Symptoms began 9 days ago. Feels achy all over her body. Had temperature one time of 101.4, otherwise temps have not been over 100. No abdominal pain, dysuria, or urinary frequency. Has history of UTIs in the past but has never felt this way before. Feels nauseated and fatigued. Having diarrhea 1-2 times per day. Not taking much by mouth because eof nausea. Has had bilateral lower extremity edema for 4 days as well. Continues to take her lasix 80mg  daily and losartan 12.5mg  daily. Skin hurts over her entire body as well.    Seen in the ED on 8/9 and had CXR which was negative, and COVID test done which was negative as well. Symptoms continue and patient is not feeling any better at all. No sick contacts. Has mild cough and mild shortness of breath which are typical for her and are not worse than usual. Daughter is very concerned, has never seen her mother this ill. Patient barely able to walk  from one room to the next without getting significant fatigue.  Review Of Systems: Per HPI with the following additions: + fever once otherwise none, mild shortness of breath at baseline, mild  cough at baseline, + nausea, + diarrhea, no abdominal pain, + lower extremity edema  Patient Active Problem List   Diagnosis Date Noted  . Prediabetes 06/25/2016  . Influenza 06/19/2016  . Acute exacerbation of CHF (congestive heart failure) ([place]) 06/11/2016  . Leg wound, left 10/31/2015  . UTI (urinary tract infection) 01/17/2015  . Pes anserine bursitis 06/16/2014  . Atrial fibrillation ([place]) 12/27/2013  . Peripheral neuropathy 10/03/2013  . PVC's (premature ventricular contractions) 01/12/2013  . Macro dislodgment of the left ventricular lead 09/09/2012  . Biventricular ICD (implantable cardioverter-defibrillator)-St. Jude   . Nonischemic cardiomyopathy ([place])   . Sinus tachycardia   . Mole (skin) 06/08/2011  . Left knee pain 04/11/2011  . Fatigue 03/11/2010  . Hyperlipidemia 05/28/2006  . HYPERTENSION, BENIGN SYSTEMIC 05/28/2006  . Chronic systolic heart failure ([place]) 05/28/2006  . Osteoporosis 05/28/2006  . INCONTINENCE, URGE 05/28/2006    Past Medical History: Past Medical History:  Diagnosis Date  . Biventricular ICD (implantable cardioverter-defibrillator)-St. Jude    Date of implant 2007 generator change 2012  . Hyperlipidemia   . Hypertension   . Macro dislodgment of the left ventricular lead 09/09/2012  . Nonischemic cardiomyopathy ([place])    Cath without coronary disease 2006  . Osteoporosis   . PAF (paroxysmal atrial fibrillation) (HCC)    Short bursts of PAF. Asymptomatic.  Sees Dr Rex Kras.  . Peripheral neuropathy, hereditary/idiopathic 2012  . Raynaud phenomenon   . Sinus tachycardia   . Systolic CHF ([place])   . Urge incontinence     Past Surgical History: Past Surgical History:  Procedure Laterality Date  . ABDOMINAL HYSTERECTOMY  1970s  . APPENDECTOMY    . BI-VENTRICULAR PACEMAKER INSERTION N/A 03/28/2013   Procedure: BI-VENTRICULAR PACEMAKER INSERTION (CRT-P);  Surgeon: Deboraha Sprang, MD;  Location: [facility] CATH LAB;  Service: Cardiovascular;  Laterality:  N/A;  . Biventricular ICD Implant  06/28/2010; 03/28/2013   STJ CRTD implanted 2012 with dislodgement of LV lead 2014; 03/28/2013 CRTP placed with new LV lead by Dr Caryl Comes  . Bladder tack    . CARDIAC CATHETERIZATION  03/2005   EF 25%, Normal arteries, Nonischemic CM  . HIP ARTHROPLASTY Left 12/20/2013   Procedure: LEFT HIP HEMI ARTHROPLASTY;  Surgeon: Meredith Pel, MD;  Location: [facility];  Service: Orthopedics;  Laterality: Left;  . LEFT AND RIGHT HEART CATHETERIZATION WITH CORONARY ANGIOGRAM N/A 12/13/2012   Procedure: LEFT AND RIGHT HEART CATHETERIZATION WITH CORONARY ANGIOGRAM;  Surgeon: Blane Ohara, MD;  Location: [facility] CATH LAB;  Service: Cardiovascular;  Laterality: N/A;  . NM MYOCAR PERF WALL MOTION  03/19/2010   EF 63%, low risk  . Suprapubic Urethrovesicular Suspension  1992  . TOTAL HIP ARTHROPLASTY Right 09/04/2014   Procedure: BIPOLAR HIP ARTHROPLASTY ANTERIOR APPROACH;  Surgeon: Mcarthur Rossetti, MD;  Location: [facility];  Service: Orthopedics;  Laterality: Right;  . US ECHOCARDIOGRAPHY  11/18/2011   EF 40%, LA moderately dilated, mild AI    Social History: Social History   Tobacco Use  . Smoking status: Former Smoker    Packs/day: 1.00    Years: 15.00    Pack years: 15.00    Quit date: 04/22/2002    Years since quitting: 16.5  . Smokeless tobacco: Never Used  Substance Use Topics  . Alcohol use: No  Comment: Recovering Alcoholic 35 yrs ago. Quit 1979.  . Drug use: No    Comment: Prescription drug addict x 41 years.   Additional social history: none  Please also refer to relevant sections of EMR.  Family History: Family History  Problem Relation Age of Onset  . Heart disease Mother   . Heart disease Father   . Cancer Father        Kidney  . Arthritis Sister   . Heart disease Brother   . Diabetes Son   . Mental retardation Son   . Alcohol abuse Sister   . Cancer Brother   . Cancer Daughter   . Heart disease Daughter     Allergies and  Medications: Allergies  Allergen Reactions  . Sulfamethoxazole Swelling   No current facility-administered medications on file prior to encounter.    Current Outpatient Medications on File Prior to Encounter  Medication Sig Dispense Refill  . acetaminophen (TYLENOL) 650 MG CR tablet Take 650 mg by mouth every 8 (eight) hours as needed for pain.    . baclofen (LIORESAL) 10 MG tablet TAKE 1/2 TABLET(5 MG) BY MOUTH DAILY AS NEEDED FOR PAIN 30 tablet 1  . carvedilol (COREG) 3.125 MG tablet TAKE 1 TABLET BY MOUTH TWICE DAILY WITH MEALS 180 tablet 1  . Cranberry (SM CRANBERRY) 300 MG tablet Take 300 mg by mouth 2 (two) times daily.     Marland Kitchen FLUAD 0.5 ML SUSY ADM 0.5ML IM UTD  0  . furosemide (LASIX) 80 MG tablet Take 1 tablet (80 mg total) by mouth daily. 90 tablet 1  . losartan (COZAAR) 25 MG tablet TAKE 1/2 TABLET BY MOUTH DAILY 7 tablet 0    Objective: BP 98/62   Pulse 88   Wt 106 lb 9.6 oz (48.4 kg)   SpO2 99%   BMI 18.88 kg/m  Gen: appears fatigued and exhausted, much less vibrant than usual HEENT: normocephalic, atraumatic, moist mucous membranes. Oropharynx clear and moist. No anterior cervical or supraclavicular lymphadenopathy.  Heart: regular rate and rhythm. No murmur Lungs: clear to auscultation bilaterally. Takes deep breaths occasionally, otherwise normal work of breathing  Neuro: alert, speech normal. Grossly nonfocal. Back: no CVA tenderness Ext: 3+ pitting edema bilateral lower extremities. Abdomen: soft, no masses. Diffusely mildly tender to palpation. No rebound or guarding. Psych: normal range of affect, well groomed, speech normal in rate and volume, normal eye contact   Labs and Imaging:  Pending   [name], MD 11/11/2018, 12:11 PM Faculty, [facility] pager: [phone], text pages welcome

## 2018-11-11 NOTE — Plan of Care (Signed)
  Problem: Education: Goal: Knowledge of General Education information will improve Description: Including pain rating scale, medication(s)/side effects and non-pharmacologic comfort measures Outcome: Progressing   Problem: Activity: Goal: Risk for activity intolerance will decrease Outcome: Progressing   Problem: Coping: Goal: Level of anxiety will decrease Outcome: Progressing   

## 2018-11-11 NOTE — Progress Notes (Signed)
Date of Visit: 11/11/2018   HPI:  Patient presents to be seen because she is not feeling well.  Symptoms began 9 days ago. Feels achy all over her body. Had temperature one time of 101.4, otherwise temps have not been over 100. No abdominal pain, dysuria, or urinary frequency. Has history of UTIs in the past but has never felt this way before. Feels nauseated and fatigued. Having diarrhea 1-2 times per day. Not taking much by mouth because eof nausea. Has had bilateral lower extremity edema for 4 days as well. Continues to take her lasix 80mg  daily and losartan 12.5mg  daily. Skin hurts over her entire body as well.  Seen in the ED on 8/9 and had CXR which was negative, and COVID test done which was negative as well. Symptoms continue and patient is not feeling any better at all. No sick contacts. Has mild cough and mild shortness of breath which are typical for her and are not worse than usual.  ROS: See HPI.  [place]: history of systolic CHF, hyperlipidemia, hypertension, urge incontinence, nonischemic cardiomyopathy with ICD in place, osteoporosis  PHYSICAL EXAM: BP 98/62   Pulse 88   Wt 106 lb 9.6 oz (48.4 kg)   SpO2 99%   BMI 18.88 kg/m  Gen: appears fatigued and exhausted, less vibrant than usual HEENT: normocephalic, atraumatic, moist mucous membranes. Oropharynx clear and moist. No anterior cervical or supraclavicular lymphadenopathy.  Heart: regular rate and rhythm. No murmur Lungs: clear to auscultation bilaterally. Takes deep breaths occasionally, otherwise normal work of breathing  Neuro: alert, speech normal. Grossly nonfocal. Back: no CVA tenderness Ext: 3+ pitting edema bilateral lower extremities. Abdomen: soft, no masses. Diffusely mildly tender to palpation. No rebound or guarding.  ASSESSMENT/PLAN:  85 yo F presenting with 9 days of body aches, extreme fatigue, nausea, and diarrhea. COVID test was negative in the ED 4 days ago. Concern for some intraabdominal process or  possible UTI. Patient unable to give urine sample today here in clinic. Needs labwork and likely imaging. Discussed options of outpatient workup vs inpatient workup with patient and her daughter. After discussion, they prefer admission to the hospital to expedite evaluation and treatment.  Given symptoms, patient must be admitted to [place] unit until she has another COVID test completed and confirmed negative.   See hospital H&P for further plan.  [place]. [name], [place]

## 2018-11-11 NOTE — Progress Notes (Addendum)
FPTS Interim Progress Note  S: Informed patient of her negative COVID and positive UA for UTI.  Patient states that she is excited she can get out of this room with all of the loud noise.  She states that it is hurting her head.  She is shocked that she has a UTI given that she has had them in the past and states that this does not feel like that.  However she reports that she has had increasing weakness with some incontinence and possible increased frequency however this is not her typical symptoms of a UTI.  O: BP 97/82   Pulse 100   Temp 97.7 F (36.5 C) (Oral)   Resp 16   Ht 5\' 3"  (1.6 m)   Wt 48.1 kg   SpO2 100%   BMI 18.78 kg/m   General: Frail, elderly lady laying in bed smiling Respiratory: CTA BL, normal work of breathing on room air Cardiac: RRR, BLL E nonpitting edema present to knees  A/P: UTI Patient reports some symptoms including increased frequency and incontinence.  Will treat with CTX  and await urine culture results.  Patient has previously had UTIs sensitive to Keflex. We will monitor her overnight for improvement in her overall symptoms after receiving antibiotics. - Attempted to call daughter, Benjamine Mola by number provided in chart for update with negative covid and UTI findings, however there was no answer  Jahdiel Krol, [place], DO 11/11/2018, 3:47 PM PGY-3, [place] [facility] Service pager [phone]

## 2018-11-12 ENCOUNTER — Other Ambulatory Visit: Payer: Self-pay | Admitting: Family Medicine

## 2018-11-12 DIAGNOSIS — N3000 Acute cystitis without hematuria: Secondary | ICD-10-CM

## 2018-11-12 LAB — BASIC METABOLIC PANEL
Anion gap: 11 (ref 5–15)
BUN: 11 mg/dL (ref 8–23)
CO2: 29 mmol/L (ref 22–32)
Calcium: 8.5 mg/dL — ABNORMAL LOW (ref 8.9–10.3)
Chloride: 96 mmol/L — ABNORMAL LOW (ref 98–111)
Creatinine, Ser: 0.72 mg/dL (ref 0.44–1.00)
GFR calc Af Amer: >60 mL/min (ref >60)
GFR calc non Af Amer: >60 mL/min (ref >60)
Glucose, Bld: 149 mg/dL — ABNORMAL HIGH (ref 70–99)
Potassium: 3.3 mmol/L — ABNORMAL LOW (ref 3.5–5.1)
Sodium: 136 mmol/L (ref 135–145)

## 2018-11-12 MED ORDER — POTASSIUM CHLORIDE CRYS ER 20 MEQ PO TBCR
30.0000 meq | EXTENDED_RELEASE_TABLET | ORAL | Status: AC
  Administered 2018-11-12 (×2): 30 meq via ORAL
  Filled 2018-11-12 (×2): qty 1

## 2018-11-12 MED ORDER — CEPHALEXIN 500 MG PO CAPS: 500.0000 mg | ORAL_CAPSULE | Freq: Four times a day (QID) | ORAL | Status: DC

## 2018-11-12 MED ORDER — CEPHALEXIN 500 MG PO CAPS: 500.0000 mg | ORAL_CAPSULE | Freq: Two times a day (BID) | ORAL | 0 refills | Status: DC

## 2018-11-12 MED ORDER — TORSEMIDE 20 MG PO TABS: 40.0000 mg | ORAL_TABLET | Freq: Every day | ORAL | 0 refills | Status: DC

## 2018-11-12 MED ORDER — CEPHALEXIN 500 MG PO CAPS: 500.0000 mg | ORAL_CAPSULE | Freq: Two times a day (BID) | ORAL | 0 refills | Status: AC

## 2018-11-12 MED ORDER — CEPHALEXIN 500 MG PO CAPS
500.0000 mg | ORAL_CAPSULE | Freq: Two times a day (BID) | ORAL | Status: DC
  Administered 2018-11-12: 500 mg via ORAL
  Filled 2018-11-12: qty 1

## 2018-11-12 MED ORDER — TORSEMIDE 20 MG PO TABS: 40.0000 mg | ORAL_TABLET | Freq: Every day | ORAL | Status: DC

## 2018-11-12 NOTE — Progress Notes (Signed)
SATURATION QUALIFICATIONS: (This note is used to comply with regulatory documentation for home oxygen)  Patient Saturations on Room Air at Rest = 100%  Patient Saturations on Room Air while Ambulating = 99%  Please briefly explain why patient needs home oxygen: Pt does not need home oxygen.  [place] Pager [phone] Office [phone]

## 2018-11-12 NOTE — TOC Initial Note (Signed)
Transition of Care [facility]) - Initial/Assessment Note    Patient Details  Name: [name] MRN: [id number] Date of Birth: [birth year]  Transition [facility] CM/SW Contact:    Pollie Friar, RN Phone Number: 11/12/2018, 12:24 PM  Clinical Narrative:                 Pt denies transportation or home medication issues.  Expected Discharge Plan: Home/Self Care Barriers to Discharge: Continued Medical Work up   Patient Goals and CMS Choice        Expected Discharge Plan and Services Expected Discharge Plan: Home/Self Care       Living arrangements for the past 2 months: Single Family Home                                      Prior Living Arrangements/Services Living arrangements for the past 2 months: Single Family Home Lives with:: Adult Children(son that is mentally disabled) Patient language and need for interpreter reviewed:: Yes(no needs) Do you feel safe going back to the place where you live?: Yes      Need for Family Participation in Patient Care: No (Comment) Care giver support system in place?: Yes (comment)(intermittent supervision per her daughter that lives behind her.) Current home services: DME(walker, cane, shower bars) Criminal Activity/Legal Involvement Pertinent to Current Situation/Hospitalization: No - Comment as needed  Activities of Daily Living Home Assistive Devices/Equipment: Cane (specify quad or straight), Walker (specify type) ADL Screening (condition at time of admission) Patient's cognitive ability adequate to safely complete daily activities?: Yes Is the patient deaf or have difficulty hearing?: No Does the patient have difficulty seeing, even when wearing glasses/contacts?: No Does the patient have difficulty concentrating, remembering, or making decisions?: No Patient able to express need for assistance with ADLs?: Yes Does the patient have difficulty dressing or bathing?: No Independently performs ADLs?: Yes (appropriate for  developmental age) Does the patient have difficulty walking or climbing stairs?: Yes Weakness of Legs: Both Weakness of Arms/Hands: None  Permission Sought/Granted                  Emotional Assessment Appearance:: Appears stated age Attitude/Demeanor/Rapport: Engaged Affect (typically observed): Accepting, Pleasant Orientation: : Oriented to Self, Oriented to Place, Oriented to  Time, Oriented to Situation   Psych Involvement: No (comment)  Admission diagnosis:  Fatigue Patient Active Problem List   Diagnosis Date Noted  . Prediabetes 06/25/2016  . Influenza 06/19/2016  . Acute exacerbation of CHF (congestive heart failure) ([place]) 06/11/2016  . Leg wound, left 10/31/2015  . UTI (urinary tract infection) 01/17/2015  . Pes anserine bursitis 06/16/2014  . Atrial fibrillation ([place]) 12/27/2013  . Peripheral neuropathy 10/03/2013  . PVC's (premature ventricular contractions) 01/12/2013  . Macro dislodgment of the left ventricular lead 09/09/2012  . Biventricular ICD (implantable cardioverter-defibrillator)-[employer]   . Nonischemic cardiomyopathy ([place])   . Sinus tachycardia   . Mole (skin) 06/08/2011  . Left knee pain 04/11/2011  . Fatigue 03/11/2010  . Hyperlipidemia 05/28/2006  . HYPERTENSION, BENIGN SYSTEMIC 05/28/2006  . Chronic systolic heart failure ([place]) 05/28/2006  . Osteoporosis 05/28/2006  . INCONTINENCE, URGE 05/28/2006   PCP:  [name], MD Pharmacy:   [facility] [place], [address] [place] [address] Phone: [phone] Fax: [phone]  [facility]  [facility] of [place], [place] [address] [place] [address] Phone: [phone] Fax: [phone]     Social Determinants of Health ([place]) Interventions    Readmission Risk Interventions No flowsheet data found.

## 2018-11-12 NOTE — Evaluation (Signed)
Physical Therapy Evaluation Patient Details Name: [name] MRN: [id number] DOB: 1930/03/08 Today's Date: 11/12/2018   History of Present Illness  Pt adm with fever and body aches. Pt found to have UTI. PMH - bil THR, pacer, ICD, chf, htn,   Clinical Impression  Pt doing well with mobility and no further PT needed.  Ready for dc from PT standpoint.      Follow Up Recommendations No PT follow up;Supervision - Intermittent    Equipment Recommendations  None recommended by PT    Recommendations for Other Services       Precautions / Restrictions Precautions Precautions: Fall      Mobility  Bed Mobility Overal bed mobility: Modified Independent             General bed mobility comments: Incr time  Transfers Overall transfer level: Needs assistance Equipment used: None;Rolling walker (2 wheeled) Transfers: Sit to/from [facility] Sit to Stand: Supervision Stand pivot transfers: Supervision       General transfer comment: supervision for safety  Ambulation/Gait Ambulation/Gait assistance: Supervision Gait Distance (Feet): 170 Feet Assistive device: Rolling walker (2 wheeled) Gait Pattern/deviations: Step-through pattern;Decreased stride length Gait velocity: decr Gait velocity interpretation: 1.31 - 2.62 ft/sec, indicative of limited community ambulator General Gait Details: supervision for safety. Amb on RA with SpO2 99%  Stairs            Wheelchair Mobility    Modified Rankin (Stroke Patients Only)       Balance Overall balance assessment: Mild deficits observed, not formally tested                                           Pertinent Vitals/Pain Pain Assessment: No/denies pain    Home Living Family/patient expects to be discharged to:: Private residence Living Arrangements: Children(66 y.o. mentally challenged son. Dtr lives behind them) Available Help at Discharge: Family;Available  PRN/intermittently Type of Home: House Home Access: Stairs to enter Entrance Stairs-Rails: Right Entrance Stairs-Number of Steps: 3 Home Layout: One level Home Equipment: Walker - 2 wheels;Cane - single point;Grab bars - tub/shower      Prior Function Level of Independence: Independent with assistive device(s)         Comments: uses a walker or cane if needed. Pt very active prior to pandemic pt went to senior center to exercise 3-4 days/wk     Hand Dominance   Dominant Hand: Right    Extremity/Trunk Assessment   Upper Extremity Assessment Upper Extremity Assessment: Defer to OT evaluation    Lower Extremity Assessment Lower Extremity Assessment: Generalized weakness       Communication   Communication: No difficulties  Cognition Arousal/Alertness: Awake/alert Behavior During Therapy: WFL for tasks assessed/performed Overall Cognitive Status: Within Functional Limits for tasks assessed                                        General Comments      Exercises     Assessment/Plan    PT Assessment Patent does not need any further PT services  PT Problem List         PT Treatment Interventions      PT Goals (Current goals can be found in the Care Plan section)  Acute Rehab PT Goals PT Goal Formulation:  All assessment and education complete, DC therapy    Frequency     Barriers to discharge        Co-evaluation               AM-PAC PT "6 Clicks" Mobility  Outcome Measure Help needed turning from your back to your side while in a flat bed without using bedrails?: None Help needed moving from lying on your back to sitting on the side of a flat bed without using bedrails?: None Help needed moving to and from a bed to a chair (including a wheelchair)?: None Help needed standing up from a chair using your arms (e.g., wheelchair or bedside chair)?: None Help needed to walk in hospital room?: None Help needed climbing 3-5 steps with a  railing? : A Little 6 Click Score: 23    End of Session   Activity Tolerance: Patient tolerated treatment well Patient left: in chair;with call bell/phone within reach Nurse Communication: Mobility status PT Visit Diagnosis: Muscle weakness (generalized) (M62.81)    Time: 9563-8756 PT Time Calculation (min) (ACUTE ONLY): 28 min   Charges:   PT Evaluation $PT Eval Low Complexity: 1 Low PT Treatments $Gait Training: 8-22 mins        [place] Pager [phone] Office [place] 11/12/2018, 10:32 AM

## 2018-11-12 NOTE — Evaluation (Signed)
Occupational Therapy Evaluation Patient Details Name: [name] MRN: [id number] DOB: [birth year] Today's Date: 11/12/2018    History of Present Illness Pt adm with fever and body aches. Pt found to have UTI. PMH - bil THR, pacer, ICD, chf, htn,    Clinical Impression   Patient evaluated by Occupational Therapy with no further acute OT needs identified. All education has been completed and the patient has no further questions. See below for any follow-up Occupational Therapy or equipment needs. OT to sign off. Thank you for referral.      Follow Up Recommendations  No OT follow up    Equipment Recommendations  None recommended by OT    Recommendations for Other Services       Precautions / Restrictions Precautions Precautions: Fall      Mobility Bed Mobility Overal bed mobility: Modified Independent             General bed mobility comments: in chair on arrival  Transfers Overall transfer level: Needs assistance Equipment used: Rolling walker (2 wheeled) Transfers: Sit to/from [facility] Sit to Stand: Supervision Stand pivot transfers: Supervision       General transfer comment: supervision for safety    Balance Overall balance assessment: Mild deficits observed, not formally tested                                         ADL either performed or assessed with clinical judgement   ADL Overall ADL's : Modified independent                                       General ADL Comments: able to dress for home and complete toilet transfer. pt reports that  incontinence existed prior to UTI but is worse now. 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 pt states she had 4 kids at 89 yo when her husband died suddenly and she has learned to make due for a really long time.      Vision Baseline Vision/History: Wears  glasses Wears Glasses: Reading only       Perception     Praxis      Pertinent Vitals/Pain Pain Assessment: No/denies pain     Hand Dominance Right   Extremity/Trunk Assessment Upper Extremity Assessment Upper Extremity Assessment: Overall WFL for tasks assessed   Lower Extremity Assessment Lower Extremity Assessment: Defer to PT evaluation   Cervical / Trunk Assessment Cervical / Trunk Assessment: Normal   Communication Communication Communication: No difficulties   Cognition Arousal/Alertness: Awake/alert Behavior During Therapy: WFL for tasks assessed/performed Overall Cognitive Status: Within Functional Limits for tasks assessed                                     General Comments  new pad provided and mesh panties. dressed for home    Exercises     Shoulder Instructions      Home Living Family/patient expects to be discharged to:: Private residence Living Arrangements: Children(66 y.o. mentally challenged son. Dtr lives behind them) Available Help at Discharge: Family;Available PRN/intermittently Type of Home: House Home Access: Stairs to enter [employer]  of Steps: 3 Entrance Stairs-Rails: Right Home Layout: One level     Bathroom Shower/Tub: Occupational psychologist: Standard     Home Equipment: Environmental consultant - 2 wheels;Cane - single point;Grab bars - tub/shower          Prior Functioning/Environment Level of Independence: Independent with assistive device(s)        Comments: uses a walker or cane if needed. Pt very active prior to pandemic pt went to senior center to exercise 3-4 days/wk reports mowing yard 6 weeks ago        OT Problem List:        OT Treatment/Interventions:      OT Goals(Current goals can be found in the care plan section) Acute Rehab OT Goals Patient Stated Goal: to go home today   OT Frequency:     Barriers to D/C:            Co-evaluation              AM-PAC OT "6  Clicks" Daily Activity     Outcome Measure Help from another person eating meals?: None Help from another person taking care of personal grooming?: None Help from another person toileting, which includes using toliet, bedpan, or urinal?: None Help from another person bathing (including washing, rinsing, drying)?: None Help from another person to put on and taking off regular upper body clothing?: None Help from another person to put on and taking off regular lower body clothing?: None 6 Click Score: 24   End of Session Nurse Communication: Mobility status;Precautions  Activity Tolerance: Patient tolerated treatment well Patient left: in chair;with call bell/phone within reach  OT Visit Diagnosis: Unsteadiness on feet (R26.81)                Time: 1047-1100 OT Time Calculation (min): 13 min Charges:  OT General Charges $OT Visit: 1 Visit OT Evaluation $OT Eval Low Complexity: 1 Low   Jeri Modena, OTR/L  Acute Rehabilitation Services Pager: [phone] Office: [phone] .   Jeri Modena 11/12/2018, 11:30 AM

## 2018-11-12 NOTE — Progress Notes (Signed)
Family Medicine Teaching Service Daily Progress Note Intern Pager: [phone]  Patient name: [name] Medical record number: [id number] Date of birth: 04-26-1929 Age: 85 y.o. Gender: female  Primary Care Provider: [name], MD Consultants: None Code Status: DNR  Pt Overview and Major Events to Date:  8/13- admitted to FMTS  Assessment and Plan: [name] is an 85 year old female who was admitted for a UTI.  She presented with fatigue body aches and diarrhea.  Her past medical history is significant for chronic systolic CHF with ICD, osteoporosis, hyperlipidemia, hypertension  UTI  Patient presented with fatigue and body aches.  Patient has a recent negative COVID test 4 days ago and also a negative COVID test on admission.  Chest x-ray on admission was unremarkable.  Patient having issues with p.o. intake but appeared hydrated on admission.  Patient has had diarrhea and had mild diffuse abdominal tenderness on admission.  Patient has a white count of 12 on admission.  Patient has been afebrile since admission. UA was positive for nitrites, moderate leukocytes, few bacteria with 0-5 squamous epithelium. -Urine culture results are pending - Originally started on ceftriaxone - Transition to p.o. Keflex for 4 days  Systolic CHF and hypertension Patient had an echo in 2017 showing an EF of 25 to 30%.  She had a biventricular ICD placed.  Home medications include Lasix 80 mg daily, carvedilol 3.125 mg twice daily, losartan 12.5 mg daily.  On admission had considerable lower extremity edema but no other signs of volume overload.  BNP on admission was elevated at [place].  Blood pressures have ranged between 97/82- 113/56 since admission. -Continuing carvedilol 3.125 mg twice daily - Lasix held at this time  Osteoporosis Patient was diagnosed but previously declined any medications for this  FEN/GI: Clear liquid, advance as tolerated PPx: SQ heparin  Disposition: Discharged  today  Subjective:  Patient reports she is doing well this morning.  Denies any urinary symptoms.  Is concerned about how she will get home because she does not know her daughter's phone number.  No other concerns at this time.  Denies shortness of breath, chest pain, abdominal pain.  Objective: Temp:  [97.7 F (36.5 C)-98.8 F (37.1 C)] 98.6 F (37 C) (08/13 2340) Pulse Rate:  [81-100] 81 (08/13 2340) Resp:  [16-17] 17 (08/13 2340) BP: (97-113)/(55-82) 113/56 (08/13 2340) SpO2:  [99 %-100 %] 99 % (08/13 2340) Weight:  [48.1 kg-48.4 kg] 48.1 kg (08/13 1538) General: NAD.  Resting comfortably in bed. HEENT: Atraumatic. Normocephalic.  Neck: No cervical lymphadenopathy.  Cardiac: RRR, no m/r/g Respiratory: CTAB, normal work of breathing Abdomen: soft, nontender, nondistended, bowel sounds normal Skin: warm and dry, no rashes noted Neuro: alert and oriented  Laboratory: Recent Labs  Lab 11/11/18 1402  WBC 12.0*  HGB 14.5  HCT 42.2  PLT 219   Recent Labs  Lab 11/11/18 1402  NA 134*  K 3.3*  CL 91*  CO2 30  BUN 14  CREATININE 0.86  CALCIUM 8.7*  PROT 6.1*  BILITOT 0.7  ALKPHOS 66  ALT 18  AST 19  GLUCOSE 138*   Results for Andersen, Regina J (MRN [id number]) as of 11/12/2018 06:09  Ref. Range 11/11/2018 13:30  URINALYSIS, ROUTINE W REFLEX MICROSCOPIC Unknown Rpt (A)  Appearance Latest Ref Range: CLEAR  CLEAR  Bilirubin Urine Latest Ref Range: NEGATIVE  NEGATIVE  Color, Urine Latest Ref Range: YELLOW  YELLOW  Glucose, UA Latest Ref Range: NEGATIVE mg/dL NEGATIVE  Hgb urine dipstick  Latest Ref Range: NEGATIVE  SMALL (A)  Ketones, ur Latest Ref Range: NEGATIVE mg/dL NEGATIVE  Leukocytes,Ua Latest Ref Range: NEGATIVE  MODERATE (A)  Nitrite Latest Ref Range: NEGATIVE  POSITIVE (A)  pH Latest Ref Range: 5.0 - 8.0  5.0  Protein Latest Ref Range: NEGATIVE mg/dL NEGATIVE  Specific Gravity, Urine Latest Ref Range: 1.005 - 1.030  1.008  Amorphous Crystal Unknown PRESENT   Bacteria, UA Latest Ref Range: NONE SEEN  FEW (A)  Hyaline Casts, UA Unknown PRESENT  Mucus Unknown PRESENT  RBC / HPF Latest Ref Range: 0 - 5 RBC/hpf 0-5  Squamous Epithelial / LPF Latest Ref Range: 0 - 5  0-5  WBC, UA Latest Ref Range: 0 - 5 WBC/hpf 11-20    Imaging/Diagnostic Tests: Dg Chest 2 View  Result Date: 11/07/2018 CLINICAL DATA:  85 year old female with history of generalized weakness and pain for the past 3 days. EXAM: CHEST - 2 VIEW COMPARISON:  Chest x-ray 09/04/2014. FINDINGS: Lung volumes are normal. No consolidative airspace disease. No pleural effusions. No evidence of pulmonary edema. Mild cardiomegaly. Upper mediastinal contours are within normal limits. Aortic atherosclerosis. Left-sided biventricular pacemaker/AICD incidentally noted. IMPRESSION: 1. No radiographic evidence of acute cardiopulmonary disease. 2. Aortic atherosclerosis. 3. Mild cardiomegaly. Electronically Signed   By: Vinnie Langton M.D.   On: 11/07/2018 10:34     Gifford Shave, MD 11/12/2018, 6:05 AM PGY-1, [place] Intern pager: [phone], text pages welcome

## 2018-11-12 NOTE — Discharge Instructions (Addendum)
Thank you for allowing [place] to participate in your care!    You were admitted for fatigue and body aches and were found to have a urinary tract infection.  You were treated with an IV antibiotic called ceftriaxone which improved her symptoms greatly.  You will need to continue taking Keflex 2 times per day for 4 more days to complete the antibiotic course for the treatment of urinary tract infection.  While you were amitted you were evaluated by physical therapy and occupational therapy who did not recommend any follow-up for you.  We recommend for your leg swelling that you start using compression stockings in order to reduce the swelling.  We have also started you on a medication called torsemide.  You should take 40 mg or 2 tablets daily.  Please STOP taking the furosemide.   These be sure to keep your follow-up appointment at the family medicine clinic with Dr. [name] on August 25 at 10:30 AM.  If you experience worsening of your admission symptoms, develop shortness of breath, life threatening emergency, suicidal or homicidal thoughts you must seek medical attention immediately by calling 911 or calling your MD immediately  if symptoms less severe.

## 2018-11-12 NOTE — Progress Notes (Signed)
SATURATION QUALIFICATIONS:   Patient Saturations on Room Air at Rest = 99%  Patient Saturations on [employer] while Ambulating = 99%  Marcene Brawn, RN 11/12/18 12:48 PM

## 2018-11-14 LAB — URINE CULTURE: Culture: >=100000 — AB

## 2018-11-14 NOTE — Discharge Summary (Signed)
[facility] Discharge Summary  Patient name: [name] Medical record number: [id number] Date of birth: July 26, 1929 Age: 85 y.o. Gender: female Date of Admission: 11/11/2018  Date of Discharge: 11/12/2018 Admitting Physician: [name], MD  Primary Care Provider: [name], MD Consultants: None  Indication for Hospitalization: Fatigue  Discharge Diagnoses/Problem List:  UTI Systolic CHF and hypertension Osteoporosis  Disposition: Home  Discharge Condition: Table  Discharge Exam: Temp:  [97.7 F (36.5 C)-98.8 F (37.1 C)] 98.6 F (37 C) (08/13 2340) Pulse Rate:  [81-100] 81 (08/13 2340) Resp:  [16-17] 17 (08/13 2340) BP: (97-113)/(55-82) 113/56 (08/13 2340) SpO2:  [99 %-100 %] 99 % (08/13 2340) Weight:  [48.1 kg-48.4 kg] 48.1 kg (08/13 1538) General: NAD.  Resting comfortably in bed. HEENT:Atraumatic. Normocephalic.  Neck: No cervical lymphadenopathy.  Cardiac: RRR, no m/r/g Respiratory: CTAB, normal work of breathing Abdomen: soft, nontender, nondistended, bowel sounds normal Skin: warm and dry, no rashes noted Neuro: alert and oriented  Brief Hospital Course:  Patient presented to the family medicine clinic with fatigue, body aches, weakness, and diarrhea for 9 days.  Patient reports she was afebrile except for one occasion where she had a fever of 101.4.  Patient had not had any changes in urination, dysuria, abdominal pain.  Has had issues with UTIs in the past had not felt that bad before.  Patient was direct admitted from the clinic to our service.  She had a negative COVID test and a positive UA for UTI.  Patient had a WBC of 12.  Patient was initially started on ceftriaxone but after susceptibilities came back she was transitioned to p.o. Keflex.  Patient reported she felt considerably better on the morning of discharge and said she was ready to go home.   Issues for Follow Up:  1. Follow-up on fatigue, body aches,  urinary symptoms   Significant Procedures: None  Significant Labs and Imaging:  Recent Labs  Lab 11/11/18 1402  WBC 12.0*  HGB 14.5  HCT 42.2  PLT 219   Recent Labs  Lab 11/11/18 1402 11/12/18 0823  NA 134* 136  K 3.3* 3.3*  CL 91* 96*  CO2 30 29  GLUCOSE 138* 149*  BUN 14 11  CREATININE 0.86 0.72  CALCIUM 8.7* 8.5*  ALKPHOS 66  --   AST 19  --   ALT 18  --   ALBUMIN 2.7*  --    Results for PLEITEZ, Marcayla J (MRN [id number]) as of 11/14/2018 11:23  Ref. Range 11/11/2018 14:02  Sed Rate Latest Ref Range: 0 - 22 mm/hr 54 (H)   Results for JOLLY, Kailyn J (MRN [id number]) as of 11/14/2018 11:23  Ref. Range 11/11/2018 13:30  URINALYSIS, ROUTINE W REFLEX MICROSCOPIC Unknown Rpt (A)  Appearance Latest Ref Range: CLEAR  CLEAR  Bilirubin Urine Latest Ref Range: NEGATIVE  NEGATIVE  Color, Urine Latest Ref Range: YELLOW  YELLOW  Glucose, UA Latest Ref Range: NEGATIVE mg/dL NEGATIVE  Hgb urine dipstick Latest Ref Range: NEGATIVE  SMALL (A)  Ketones, ur Latest Ref Range: NEGATIVE mg/dL NEGATIVE  Leukocytes,Ua Latest Ref Range: NEGATIVE  MODERATE (A)  Nitrite Latest Ref Range: NEGATIVE  POSITIVE (A)  pH Latest Ref Range: 5.0 - 8.0  5.0  Protein Latest Ref Range: NEGATIVE mg/dL NEGATIVE  Specific Gravity, Urine Latest Ref Range: 1.005 - 1.030  1.008  Amorphous Crystal Unknown PRESENT  Bacteria, UA Latest Ref Range: NONE SEEN  FEW (A)  Hyaline Casts, UA Unknown PRESENT  Mucus Unknown PRESENT  RBC / HPF Latest Ref Range: 0 - 5 RBC/hpf 0-5  Squamous Epithelial / LPF Latest Ref Range: 0 - 5  0-5  WBC, UA Latest Ref Range: 0 - 5 WBC/hpf 11-20   BNP-1885 Glucose 138,194 Dg Chest 2 View  Result Date: 11/07/2018 CLINICAL DATA:  85 year old female with history of generalized weakness and pain for the past 3 days. EXAM: CHEST - 2 VIEW COMPARISON:  Chest x-ray 09/04/2014. FINDINGS: Lung volumes are normal. No consolidative airspace disease. No pleural effusions. No evidence of pulmonary edema.  Mild cardiomegaly. Upper mediastinal contours are within normal limits. Aortic atherosclerosis. Left-sided biventricular pacemaker/AICD incidentally noted. IMPRESSION: 1. No radiographic evidence of acute cardiopulmonary disease. 2. Aortic atherosclerosis. 3. Mild cardiomegaly. Electronically Signed   By: Vinnie Langton M.D.   On: 11/07/2018 10:34    Results/Tests Pending at Time of Discharge:  Discharge Medications:  Allergies as of 11/12/2018      Reactions   Sulfamethoxazole Swelling      Medication List    STOP taking these medications   furosemide 80 MG tablet Commonly known as: LASIX     TAKE these medications   acetaminophen 650 MG CR tablet Commonly known as: TYLENOL Take 650 mg by mouth every 8 (eight) hours as needed for pain.   baclofen 10 MG tablet Commonly known as: LIORESAL TAKE 1/2 TABLET(5 MG) BY MOUTH DAILY AS NEEDED FOR PAIN What changed: See the new instructions.   carvedilol 3.125 MG tablet Commonly known as: COREG TAKE 1 TABLET BY MOUTH TWICE DAILY WITH MEALS   cephALEXin 500 MG capsule Commonly known as: KEFLEX Take 1 capsule (500 mg total) by mouth every 12 (twelve) hours for 4 days.   losartan 25 MG tablet Commonly known as: COZAAR TAKE 1/2 TABLET BY MOUTH DAILY   SM Cranberry 300 MG tablet Generic drug: Cranberry Take 300 mg by mouth 2 (two) times daily.       Discharge Instructions: Please refer to Patient Instructions section of EMR for full details.  Patient was counseled important signs and symptoms that should prompt return to medical care, changes in medications, dietary instructions, activity restrictions, and follow up appointments.   Follow-Up Appointments: Follow-up Information    [name], MD. Go on 11/23/2018.   Specialty: Family Medicine Why: Your appointment is scheduled for 10:30 AM.  Please arrive 15 minutes early. Contact information: [place] [address] [phone]            Gifford Shave, MD 11/14/2018, 2:38 PM PGY-1, [place]

## 2018-11-23 ENCOUNTER — Other Ambulatory Visit: Payer: Self-pay

## 2018-11-23 ENCOUNTER — Ambulatory Visit (INDEPENDENT_AMBULATORY_CARE_PROVIDER_SITE_OTHER): Payer: Medicare HMO | Admitting: Family Medicine

## 2018-11-23 DIAGNOSIS — I5022 Chronic systolic (congestive) heart failure: Secondary | ICD-10-CM

## 2018-11-23 NOTE — Patient Instructions (Signed)
Can cut back to one torsemide pill per day Let me know if swelling worsens Want to make sure you are still peeing fine on this dose  Check blood pressure a few times a week at home Call if getting under 110/70 consistently  Follow up as needed Be well, Dr. [name]

## 2018-12-01 NOTE — Progress Notes (Signed)
  HPI:  [name] presents for hospital follow up. Patient was hospitalized from 11/11/18 to 11/12/18 with fatigue, found to have UTI. Discharged on keflex, which patient has taken and completed. She is overall feeling much better since discharge from the hospital. Still gets fatigued but better. No dysuria or other urinary symptoms presently (though she didn't have those symptoms before either).  During her hospitalization her furosemide 80mg  dose was changed to torsemide 40mg  daily. Legs are less swollen now. Blood pressure was soft initially during this visit, improved on recheck. No dizziness or lightheadedness.  ROS: See HPI.  Benjamin Perez: history of systolic CHF, ICD in place, osteoporosis, hypertension, hyperlipidemia   PHYSICAL EXAM: BP 120/60   Pulse 82   Wt 103 lb (46.7 kg)   SpO2 99%   BMI 18.25 kg/m  Gen: no acute distress, pleasant, cooperative HEENT: normocephalic, atraumatic  Lungs: normal work of breathing  Neuro: alert, grossly nonfocal, speech normal Ext: 1+ edema bilateral lower extremity (much improved from prior)  ASSESSMENT/PLAN:  Chronic systolic heart failure (HCC) Decrease torsemide to 20mg  daily, patient to monitor for worsening of swelling. Want to avoid overdiuresis especially given soft initial blood pressure here (improved on recheck). Want to avoid falls as she has history of previously fracturing both hips.  UTI Resolved, advised to come in for a visit sooner if she has symptoms like this again in the future.  FOLLOW UP: Follow up as needed  [name], [place]

## 2018-12-01 NOTE — Assessment & Plan Note (Addendum)
Decrease torsemide to 20mg  daily, patient to monitor for worsening of swelling. Want to avoid overdiuresis especially given soft initial blood pressure here (improved on recheck). Want to avoid falls as she has history of previously fracturing both hips.

## 2018-12-27 ENCOUNTER — Ambulatory Visit (INDEPENDENT_AMBULATORY_CARE_PROVIDER_SITE_OTHER): Payer: Medicare HMO | Admitting: *Deleted

## 2018-12-27 DIAGNOSIS — I428 Other cardiomyopathies: Secondary | ICD-10-CM

## 2018-12-27 DIAGNOSIS — I480 Paroxysmal atrial fibrillation: Secondary | ICD-10-CM

## 2019-01-02 LAB — CUP PACEART REMOTE DEVICE CHECK
Battery Remaining Longevity: 22 mo
Battery Remaining Percentage: 28 %
Battery Voltage: 2.81 V
Brady Statistic AP VP Percent: 1.5 %
Brady Statistic AP VS Percent: 1 %
Brady Statistic AS VP Percent: 91 %
Brady Statistic AS VS Percent: 1.7 %
Brady Statistic RA Percent Paced: 1 %
Date Time Interrogation Session: 20201003123936
Implantable Lead Implant Date: 20070712
Implantable Lead Implant Date: 20070712
Implantable Lead Implant Date: 20141229
Implantable Lead Location: 753858
Implantable Lead Location: 753859
Implantable Lead Location: 753860
Implantable Lead Model: 7020
Implantable Pulse Generator Implant Date: 20141229
Lead Channel Impedance Value: 290 Ohm
Lead Channel Impedance Value: 400 Ohm
Lead Channel Impedance Value: 440 Ohm
Lead Channel Pacing Threshold Amplitude: 0.75 V
Lead Channel Pacing Threshold Amplitude: 0.75 V
Lead Channel Pacing Threshold Amplitude: 1.125 V
Lead Channel Pacing Threshold Pulse Width: 0.4 ms
Lead Channel Pacing Threshold Pulse Width: 0.4 ms
Lead Channel Pacing Threshold Pulse Width: 0.7 ms
Lead Channel Sensing Intrinsic Amplitude: 12 mV
Lead Channel Sensing Intrinsic Amplitude: 3.2 mV
Lead Channel Setting Pacing Amplitude: 2 V
Lead Channel Setting Pacing Amplitude: 2.125
Lead Channel Setting Pacing Amplitude: 2.5 V
Lead Channel Setting Pacing Pulse Width: 0.4 ms
Lead Channel Setting Pacing Pulse Width: 0.7 ms
Lead Channel Setting Sensing Sensitivity: 2 mV
Pulse Gen Model: 3242
Pulse Gen Serial Number: 7554613

## 2019-01-04 ENCOUNTER — Ambulatory Visit (INDEPENDENT_AMBULATORY_CARE_PROVIDER_SITE_OTHER): Payer: Medicare HMO

## 2019-01-04 ENCOUNTER — Other Ambulatory Visit: Payer: Self-pay

## 2019-01-04 DIAGNOSIS — Z23 Encounter for immunization: Secondary | ICD-10-CM

## 2019-01-04 NOTE — Progress Notes (Signed)
Patient presents in nurse clinic for flu vaccine. Vaccine given, LD. Patient tolerated well.

## 2019-01-05 NOTE — Progress Notes (Signed)
Remote pacemaker transmission.   

## 2019-01-31 ENCOUNTER — Other Ambulatory Visit: Payer: Self-pay

## 2019-02-01 MED ORDER — CARVEDILOL 3.125 MG PO TABS: 3.1250 mg | ORAL_TABLET | Freq: Two times a day (BID) | ORAL | 1 refills | Status: DC

## 2019-02-04 ENCOUNTER — Other Ambulatory Visit: Payer: Self-pay | Admitting: Family Medicine

## 2019-03-03 ENCOUNTER — Other Ambulatory Visit: Payer: Self-pay

## 2019-03-03 ENCOUNTER — Ambulatory Visit (INDEPENDENT_AMBULATORY_CARE_PROVIDER_SITE_OTHER): Payer: Medicare HMO | Admitting: Family Medicine

## 2019-03-03 VITALS — BP 128/72 | HR 103 | Wt 110.6 lb

## 2019-03-03 DIAGNOSIS — R399 Unspecified symptoms and signs involving the genitourinary system: Secondary | ICD-10-CM | POA: Diagnosis not present

## 2019-03-03 DIAGNOSIS — N390 Urinary tract infection, site not specified: Secondary | ICD-10-CM | POA: Diagnosis not present

## 2019-03-03 LAB — POCT UA - MICROSCOPIC ONLY

## 2019-03-03 LAB — POCT URINALYSIS DIP (MANUAL ENTRY)
Bilirubin, UA: NEGATIVE
Blood, UA: NEGATIVE
Glucose, UA: NEGATIVE mg/dL
Ketones, POC UA: NEGATIVE mg/dL
Nitrite, UA: POSITIVE — AB
Protein Ur, POC: NEGATIVE mg/dL
Spec Grav, UA: 1.02 (ref 1.010–1.025)
Urobilinogen, UA: 0.2 E.U./dL
pH, UA: 7 (ref 5.0–8.0)

## 2019-03-03 MED ORDER — CEPHALEXIN 500 MG PO CAPS: 500.0000 mg | ORAL_CAPSULE | Freq: Two times a day (BID) | ORAL | 0 refills | Status: DC

## 2019-03-03 NOTE — Patient Instructions (Addendum)
It was nice meeting you today Ms. Bankert!    If you have any questions or concerns, please feel free to call the clinic.   Be well,  Dr. Shan Levans

## 2019-03-03 NOTE — Assessment & Plan Note (Signed)
UA with nitrites and leukocyte esterase.  Patient is an accurate historian with a long history of UTIs, so this also supports the likelihood of a current UTI.  Will obtain urine culture today.  Will treat with Keflex 500 mg twice daily for 7 days and follow-up on the culture to ensure that the bacteria is sensitive to this antibiotic.

## 2019-03-03 NOTE — Progress Notes (Signed)
   Subjective:    [name] - 85 y.o. female MRN [id number]  Date of birth: [birth year]  CC:  [name] is here for UTI symptoms.  HPI: UTI symptoms Symptoms include fatigue, pelvic bloating, pain in upper legs, which is normal for her UTI symptoms No burning this time, although she did not have this symptom during several past infections More urgency and frequency than usual Has frequent UTIs, last infection was in August 2020, for which she was hospitalized Denies flank pain, fever, and chills Keflex has worked well for her before with E. coli sensitive to this medication isolated on culture in August 2020, although she did have Klebsiella resistant to cefazolin in 2016  Health Maintenance:  There are no preventive care reminders to display for this patient.  -  reports that she quit smoking about 16 years ago. She has a 15.00 pack-year smoking history. She has never used smokeless tobacco. - Review of Systems: Per HPI. - Past Medical History: Patient Active Problem List   Diagnosis Date Noted  . Prediabetes 06/25/2016  . Influenza 06/19/2016  . Acute exacerbation of CHF (congestive heart failure) ([place]) 06/11/2016  . Leg wound, left 10/31/2015  . UTI (urinary tract infection) 01/17/2015  . Pes anserine bursitis 06/16/2014  . Atrial fibrillation ([place]) 12/27/2013  . Peripheral neuropathy 10/03/2013  . PVC's (premature ventricular contractions) 01/12/2013  . Macro dislodgment of the left ventricular lead 09/09/2012  . Biventricular ICD (implantable cardioverter-defibrillator)-St. Jude   . Nonischemic cardiomyopathy ([place])   . Sinus tachycardia   . Mole (skin) 06/08/2011  . Left knee pain 04/11/2011  . Fatigue 03/11/2010  . Hyperlipidemia 05/28/2006  . HYPERTENSION, BENIGN SYSTEMIC 05/28/2006  . Chronic systolic heart failure ([place]) 05/28/2006  . Osteoporosis 05/28/2006  . INCONTINENCE, URGE 05/28/2006   - Medications: reviewed and updated   Objective:   Physical  Exam BP 128/72   Pulse (!) 103   Wt 110 lb 9.6 oz (50.2 kg)   SpO2 96%   BMI 19.59 kg/m  Gen: NAD, alert, cooperative with exam, well-appearing CV: RRR, good S1/S2, no murmur, no edema Resp: CTABL, no wheezes, non-labored Abd: No CVA tenderness, mild tenderness to palpation of suprapubic area Psych: good insight, alert and oriented        Assessment & Plan:   UTI (urinary tract infection) UA with nitrites and leukocyte esterase.  Patient is an accurate historian with a long history of UTIs, so this also supports the likelihood of a current UTI.  Will obtain urine culture today.  Will treat with Keflex 500 mg twice daily for 7 days and follow-up on the culture to ensure that the bacteria is sensitive to this antibiotic.    Maia Breslow, M.D. 03/03/2019, 12:02 PM PGY-3, [place]

## 2019-03-10 LAB — URINE CULTURE

## 2019-03-28 ENCOUNTER — Ambulatory Visit (INDEPENDENT_AMBULATORY_CARE_PROVIDER_SITE_OTHER): Payer: Medicare HMO | Admitting: *Deleted

## 2019-03-28 DIAGNOSIS — I428 Other cardiomyopathies: Secondary | ICD-10-CM

## 2019-03-30 LAB — CUP PACEART REMOTE DEVICE CHECK
Battery Remaining Longevity: 12 mo
Battery Remaining Percentage: 15 %
Battery Voltage: 2.75 V
Brady Statistic AP VP Percent: 1.5 %
Brady Statistic AP VS Percent: 1 %
Brady Statistic AS VP Percent: 92 %
Brady Statistic AS VS Percent: 1.7 %
Brady Statistic RA Percent Paced: 1 %
Date Time Interrogation Session: 20201230061830
Implantable Lead Implant Date: 20070712
Implantable Lead Implant Date: 20070712
Implantable Lead Implant Date: 20141229
Implantable Lead Location: 753858
Implantable Lead Location: 753859
Implantable Lead Location: 753860
Implantable Lead Model: 7020
Implantable Pulse Generator Implant Date: 20141229
Lead Channel Impedance Value: 300 Ohm
Lead Channel Impedance Value: 480 Ohm
Lead Channel Impedance Value: 510 Ohm
Lead Channel Pacing Threshold Amplitude: 0.75 V
Lead Channel Pacing Threshold Amplitude: 0.75 V
Lead Channel Pacing Threshold Amplitude: 1.125 V
Lead Channel Pacing Threshold Pulse Width: 0.4 ms
Lead Channel Pacing Threshold Pulse Width: 0.4 ms
Lead Channel Pacing Threshold Pulse Width: 0.7 ms
Lead Channel Sensing Intrinsic Amplitude: 12 mV
Lead Channel Sensing Intrinsic Amplitude: 2.8 mV
Lead Channel Setting Pacing Amplitude: 2 V
Lead Channel Setting Pacing Amplitude: 2.125
Lead Channel Setting Pacing Amplitude: 2.5 V
Lead Channel Setting Pacing Pulse Width: 0.4 ms
Lead Channel Setting Pacing Pulse Width: 0.7 ms
Lead Channel Setting Sensing Sensitivity: 2 mV
Pulse Gen Model: 3242
Pulse Gen Serial Number: 7554613

## 2019-04-06 ENCOUNTER — Other Ambulatory Visit: Payer: Self-pay | Admitting: Family Medicine

## 2019-06-03 ENCOUNTER — Telehealth: Payer: Self-pay

## 2019-06-03 NOTE — Telephone Encounter (Signed)
PM alert received- pt had NSVT episode approx 47 beats on 3/2.  Pt medications Carvedilol 3.125mg  BID.    Pt overdue for in-clinic/ MD follow-up.    Attempted to reach pt, no answer/ No DPR on file, left HIPAA approved message on VM requesting pt callback

## 2019-06-03 NOTE — Telephone Encounter (Signed)
Pt returned phone call.  States she was not symptomatic at time of episode.  Pt confirmed she has not missed any doses of medication.  Advised o/d for appt will have scheduling contact to set up.

## 2019-06-09 NOTE — Progress Notes (Signed)
Cardiology Office Note Date:  06/10/2019  Patient ID:  [name], [name] 1929/05/18, MRN [id number] PCP:  [name], MD  Cardiologist:  Dr. Caryl Comes     Chief Complaint:  VT  History of Present Illness: [name] is a 86 y.o. female with history of HTN, HLD, chronic CHF (systolic), Raynaud.  She comes in today to be seen for Dr. Caryl Comes, last seen by him in 11/2016.  He discussed that she had LV dislodgement and undergone ICD downgrade to PPM and new LV lead placement  Remotes note some false AFib (FFOS) and a few true only seconds in duration  Device clinic alert received for 47beats NSVT on 05/31/2019, pt was asymptomatic, taking her coreg as prescribed without missing doses and called to come in.  She is doing well.  Sounds quite active for someone her age.  She cares for her home, likes to work in the yard, hels care for her handicapped son.   She denies any kind of CP, plapitations, no cardiac awareness. No dizziness, near syncope or syncope. No SOB, DOE or exertional incapacities or change from her baseline.  She mentions she is 89 and not running around, but does very well.  Device information  SJM CRT-P Initial device implanted 2007 (RA and RV lead) and was ICD >> 03/28/2013 downgraded to PPM >> 03/29/2013 LV lead revision  Past Medical History:  Diagnosis Date  . Biventricular ICD (implantable cardioverter-defibrillator)-St. Jude    Date of implant 2007 generator change 2012  . Hyperlipidemia   . Hypertension   . Macro dislodgment of the left ventricular lead 09/09/2012  . Nonischemic cardiomyopathy ([place])    Cath without coronary disease 2006  . Osteoporosis   . PAF (paroxysmal atrial fibrillation) (HCC)    Short bursts of PAF. Asymptomatic.  Sees Dr Rex Kras.  . Peripheral neuropathy, hereditary/idiopathic 2012  . Raynaud phenomenon   . Sinus tachycardia   . Systolic CHF ([place])   . Urge incontinence     Past Surgical History:  Procedure Laterality Date   . ABDOMINAL HYSTERECTOMY  1970s  . APPENDECTOMY    . BI-VENTRICULAR PACEMAKER INSERTION N/A 03/28/2013   Procedure: BI-VENTRICULAR PACEMAKER INSERTION (CRT-P);  Surgeon: Deboraha Sprang, MD;  Location: [facility] CATH LAB;  Service: Cardiovascular;  Laterality: N/A;  . Biventricular ICD Implant  06/28/2010; 03/28/2013   STJ CRTD implanted 2012 with dislodgement of LV lead 2014; 03/28/2013 CRTP placed with new LV lead by Dr Caryl Comes  . Bladder tack    . CARDIAC CATHETERIZATION  03/2005   EF 25%, Normal arteries, Nonischemic CM  . HIP ARTHROPLASTY Left 12/20/2013   Procedure: LEFT HIP HEMI ARTHROPLASTY;  Surgeon: Meredith Pel, MD;  Location: [facility];  Service: Orthopedics;  Laterality: Left;  . LEFT AND RIGHT HEART CATHETERIZATION WITH CORONARY ANGIOGRAM N/A 12/13/2012   Procedure: LEFT AND RIGHT HEART CATHETERIZATION WITH CORONARY ANGIOGRAM;  Surgeon: Blane Ohara, MD;  Location: [facility] CATH LAB;  Service: Cardiovascular;  Laterality: N/A;  . NM MYOCAR PERF WALL MOTION  03/19/2010   EF 63%, low risk  . Suprapubic Urethrovesicular Suspension  1992  . TOTAL HIP ARTHROPLASTY Right 09/04/2014   Procedure: BIPOLAR HIP ARTHROPLASTY ANTERIOR APPROACH;  Surgeon: Mcarthur Rossetti, MD;  Location: [place];  Service: Orthopedics;  Laterality: Right;  . US ECHOCARDIOGRAPHY  11/18/2011   EF 40%, LA moderately dilated, mild AI    Current Outpatient Medications  Medication Sig Dispense Refill  . acetaminophen (TYLENOL) 650 MG CR  tablet Take 650 mg by mouth every 8 (eight) hours as needed for pain.    . baclofen (LIORESAL) 10 MG tablet TAKE 1/2 TABLET(5 MG) BY MOUTH DAILY AS NEEDED FOR PAIN 30 tablet 1  . carvedilol (COREG) 6.25 MG tablet Take 1 tablet (6.25 mg total) by mouth 2 (two) times daily with a meal. 180 tablet 1  . Cranberry (SM CRANBERRY) 300 MG tablet Take 300 mg by mouth 2 (two) times daily.     Marland Kitchen losartan (COZAAR) 25 MG tablet TAKE 1/2 TABLET BY MOUTH DAILY (Patient taking differently: Take 12.5 mg by  mouth daily. ) 7 tablet 0  . torsemide (DEMADEX) 20 MG tablet TAKE 2 TABLETS(40 MG) BY MOUTH DAILY 180 tablet 2   No current facility-administered medications for this visit.    Allergies:   Sulfamethoxazole   Social History:  The patient  reports that she quit smoking about 17 years ago. She has a 15.00 pack-year smoking history. She has never used smokeless tobacco. She reports that she does not drink alcohol or use drugs.   Family History:  The patient's family history includes Alcohol abuse in her sister; Arthritis in her sister; Cancer in her brother, daughter, and father; Diabetes in her son; Heart disease in her brother, daughter, father, and mother; Mental retardation in her son.  ROS:  Please see the history of present illness.  All other systems are reviewed and otherwise negative.   PHYSICAL EXAM:  VS:  BP (!) 122/54   Pulse 88   Ht 5\' 3"  (1.6 m)   Wt 113 lb (51.3 kg)   SpO2 99%   BMI 20.02 kg/m  BMI: Body mass index is 20.02 kg/m. Well nourished, well developed, in no acute distress  HEENT: normocephalic, atraumatic  Neck: no JVD, carotid bruits or masses Cardiac:  RRR; no significant murmurs, no rubs, or gallops Lungs:  CTA b/l, no wheezing, rhonchi or rales  Abd: soft, nontender MS: no deformity, age appropriate atrophyatrophy Ext: no pitting edema  Skin: warm and dry, no rash Neuro:  No gross deficits appreciated Psych: euthymic mood, full affect  PPM site is stable, no tethering or discomfort   EKG:  Done today and reviewed by myself shows  SR, V paced  PPM interrogation done today and reviewed by myself Battery is <77mo to ERI (has not reached yet) Lead measurements are stable LV has cap confirm on AMS appear to be the already known FFOS One HVR is NSVT, 9 seconds   04/03/2015; TTE Study Conclusions  - Left ventricle: The cavity size was moderately dilated. Wall  thickness was normal. Systolic function was severely reduced. The  estimated  ejection fraction was in the range of 25% to 30%.  Diffuse hypokinesis. Doppler parameters are consistent with  elevated ventricular end-diastolic filling pressure.  - Aortic valve: There was mild regurgitation.  - Mitral valve: There was moderate regurgitation.  - Atrial septum: No defect or patent foramen ovale was identified.   12/13/2012: R/LHC Final Conclusions:   1. Patent coronary arteries with mild nonobstructive CAD as detailed above 2. Normal intracardiac hemodynamics 3. Moderate LV dysfunction with mild-moderate MR  Recent Labs: 08/31/2018: TSH 2.860 11/11/2018: ALT 18; B Natriuretic Peptide 1,885.0; Hemoglobin 14.5; Platelets 219 06/10/2019: BUN 13; Creatinine, Ser 0.87; Magnesium 1.9; Potassium 3.9; Sodium 136  No results found for requested labs within last 8760 hours.   Estimated Creatinine Clearance: 36.9 mL/min (by C-G formula based on SCr of 0.87 mg/dL).   Wt Readings from  Last 3 Encounters:  06/10/19 113 lb (51.3 kg)  03/03/19 110 lb 9.6 oz (50.2 kg)  11/23/18 103 lb (46.7 kg)     Other studies reviewed: Additional studies/records reviewed today include: summarized above  ASSESSMENT AND PLAN:  1. CRT-P     Intact function, no programming changes made     Battery is nearing ERI     I have asked she be placed on monthly battery checks  I discussed generator change procedure with her , potential risks, benefits, she is agreeable to proceed hen the time comes If happened before her next appt, we can call and arrange without coming in  2. NICM 3. Chronic CHF     No symptoms or exam findings of volume OL     On BB/ARB, diutretic  4. HTN     Looks ok  5. NSVT     Asymptomatic     Discussed NSVT, she has known NICM     She mentions at her last gen checge it was her preferance nit to gave ICD placed back in, she would prefer a more conservative approach     No CP or symptoms of angina     BMET and mag today, increase her coreg to 6.25mg   BID   Disposition: F/u in 80mo, likely she will be ready for gen change by them   Current medicines are reviewed at length with the patient today.  The patient did not have any concerns regarding medicines.  Venetia Night, PA-C 06/10/2019 5:18 PM     [address] [place] [address] [phone] (office)  [phone] (fax)

## 2019-06-10 ENCOUNTER — Other Ambulatory Visit: Payer: Self-pay

## 2019-06-10 ENCOUNTER — Ambulatory Visit: Payer: Medicare HMO | Admitting: Physician Assistant

## 2019-06-10 VITALS — BP 122/54 | HR 88 | Ht 63.0 in | Wt 113.0 lb

## 2019-06-10 DIAGNOSIS — I472 Ventricular tachycardia: Secondary | ICD-10-CM

## 2019-06-10 DIAGNOSIS — I10 Essential (primary) hypertension: Secondary | ICD-10-CM

## 2019-06-10 DIAGNOSIS — I4729 Other ventricular tachycardia: Secondary | ICD-10-CM

## 2019-06-10 DIAGNOSIS — Z950 Presence of cardiac pacemaker: Secondary | ICD-10-CM

## 2019-06-10 DIAGNOSIS — Z79899 Other long term (current) drug therapy: Secondary | ICD-10-CM | POA: Diagnosis not present

## 2019-06-10 DIAGNOSIS — I428 Other cardiomyopathies: Secondary | ICD-10-CM

## 2019-06-10 DIAGNOSIS — I5022 Chronic systolic (congestive) heart failure: Secondary | ICD-10-CM

## 2019-06-10 DIAGNOSIS — I110 Hypertensive heart disease with heart failure: Secondary | ICD-10-CM | POA: Diagnosis not present

## 2019-06-10 LAB — BASIC METABOLIC PANEL
BUN/Creatinine Ratio: 15 (ref 12–28)
BUN: 13 mg/dL (ref 8–27)
CO2: 23 mmol/L (ref 20–29)
Calcium: 9.1 mg/dL (ref 8.7–10.3)
Chloride: 98 mmol/L (ref 96–106)
Creatinine, Ser: 0.87 mg/dL (ref 0.57–1.00)
GFR calc Af Amer: 69 mL/min/{1.73_m2} (ref >59)
GFR calc non Af Amer: 60 mL/min/{1.73_m2} (ref >59)
Glucose: 131 mg/dL — ABNORMAL HIGH (ref 65–99)
Potassium: 3.9 mmol/L (ref 3.5–5.2)
Sodium: 136 mmol/L (ref 134–144)

## 2019-06-10 LAB — MAGNESIUM: Magnesium: 1.9 mg/dL (ref 1.6–2.3)

## 2019-06-10 MED ORDER — CARVEDILOL 6.25 MG PO TABS: 6.2500 mg | ORAL_TABLET | Freq: Two times a day (BID) | ORAL | 1 refills | Status: DC

## 2019-06-10 NOTE — Patient Instructions (Addendum)
Medication Instructions:   STAR TAKING COREG  6.25 MG TWICE A DAY    *If you need a refill on your cardiac medications before your next appointment, please call your pharmacy*   Lab Work:  BMET AND MAGNESIUM TODAY   If you have labs (blood work) drawn today and your tests are completely normal, you will receive your results only by: Marland Kitchen MyChart Message (if you have MyChart) OR . A paper copy in the mail If you have any lab test that is abnormal or we need to change your treatment, we will call you to review the results.   Testing/Procedures: NONE ORDERED  TODAY    Follow-Up: At [facility], you and your health needs are our priority.  As part of our continuing mission to provide you with exceptional heart care, we have created designated Provider Care Teams.  These Care Teams include your primary Cardiologist (physician) and Advanced Practice Providers (APPs -  Physician Assistants and Nurse Practitioners) who all work together to provide you with the care you need, when you need it.  We recommend signing up for the patient portal called "MyChart".  Sign up information is provided on this After Visit Summary.  MyChart is used to connect with patients for Virtual Visits (Telemedicine).  Patients are able to view lab/test results, encounter notes, upcoming appointments, etc.  Non-urgent messages can be sent to your provider as well.   To learn more about what you can do with MyChart, go to [email or web address].    Your next appointment:   3 month(s)  The format for your next appointment:   In Person  Provider:   You may see Dr. Caryl Comes  or one of the following Advanced Practice Providers on your designated Care Team:    Chanetta Marshall, NP  Tommye Standard, PA-C  Legrand Como "Oda Kilts, [place]    Other Instructions

## 2019-06-27 ENCOUNTER — Ambulatory Visit (INDEPENDENT_AMBULATORY_CARE_PROVIDER_SITE_OTHER): Payer: Medicare HMO | Admitting: *Deleted

## 2019-06-27 DIAGNOSIS — I428 Other cardiomyopathies: Secondary | ICD-10-CM

## 2019-06-30 LAB — CUP PACEART REMOTE DEVICE CHECK
Battery Remaining Longevity: 0 mo
Battery Voltage: 2.62 V
Brady Statistic AP VP Percent: 1 %
Brady Statistic AP VS Percent: 1 %
Brady Statistic AS VP Percent: 95 %
Brady Statistic AS VS Percent: 2.3 %
Brady Statistic RA Percent Paced: 1 %
Date Time Interrogation Session: 20210401075942
Implantable Lead Implant Date: 20070712
Implantable Lead Implant Date: 20070712
Implantable Lead Implant Date: 20141229
Implantable Lead Location: 753858
Implantable Lead Location: 753859
Implantable Lead Location: 753860
Implantable Lead Model: 7020
Implantable Pulse Generator Implant Date: 20141229
Lead Channel Impedance Value: 290 Ohm
Lead Channel Impedance Value: 410 Ohm
Lead Channel Impedance Value: 480 Ohm
Lead Channel Pacing Threshold Amplitude: 0.75 V
Lead Channel Pacing Threshold Amplitude: 0.75 V
Lead Channel Pacing Threshold Amplitude: 1.125 V
Lead Channel Pacing Threshold Pulse Width: 0.4 ms
Lead Channel Pacing Threshold Pulse Width: 0.4 ms
Lead Channel Pacing Threshold Pulse Width: 0.7 ms
Lead Channel Sensing Intrinsic Amplitude: 12 mV
Lead Channel Sensing Intrinsic Amplitude: 2.8 mV
Lead Channel Setting Pacing Amplitude: 2 V
Lead Channel Setting Pacing Amplitude: 2.125
Lead Channel Setting Pacing Amplitude: 2.5 V
Lead Channel Setting Pacing Pulse Width: 0.4 ms
Lead Channel Setting Pacing Pulse Width: 0.7 ms
Lead Channel Setting Sensing Sensitivity: 2 mV
Pulse Gen Model: 3242
Pulse Gen Serial Number: 7554613

## 2019-06-30 NOTE — Progress Notes (Signed)
PPM Remote  

## 2019-06-30 NOTE — Addendum Note (Signed)
Addended by: Jennette Banker on: 06/30/2019 09:46 PM   Modules accepted: Level of Service

## 2019-07-01 ENCOUNTER — Telehealth: Payer: Self-pay | Admitting: *Deleted

## 2019-07-01 NOTE — Telephone Encounter (Signed)
Spoke with patient to advise of PPM at [facility] as of 06/27/19. Pt verbalizes understanding. Pt saw Tommye Standard, PA-C, on 06/10/19, plan at that time was to schedule generator replacement without additional appointment. Pt is still agreeable to this plan. Will forward to Dr. Caryl Comes as Juluis Rainier and Rosann Auerbach, RN, to arrange gen change procedure. Pt verbalizes understanding and agreement with plan. Pt denies additional questions at this time.

## 2019-07-05 NOTE — Telephone Encounter (Signed)
Attempted phone call to pt.  Left voicemail message to contact RN at [phone]. 

## 2019-07-12 NOTE — Telephone Encounter (Signed)
Spoke with pt re: scheduling generator change.  Pt would prefer procedure be scheduled for 08/16/2019.  Pt advised once procedure scheduled pt will be contacted with instructions.  Pt verbalizes understanding and agrees with plan

## 2019-07-18 DIAGNOSIS — Z01812 Encounter for preprocedural laboratory examination: Secondary | ICD-10-CM

## 2019-07-18 DIAGNOSIS — I5022 Chronic systolic (congestive) heart failure: Secondary | ICD-10-CM

## 2019-07-18 DIAGNOSIS — I428 Other cardiomyopathies: Secondary | ICD-10-CM

## 2019-07-21 NOTE — Telephone Encounter (Signed)
Spoke with pt and reviewed generator change out instruction letter. (See letter)  Copy of letter placed to be mailed.Pt verbalizes understanding and agrees with current plan.

## 2019-07-29 ENCOUNTER — Telehealth: Payer: Self-pay

## 2019-07-29 ENCOUNTER — Other Ambulatory Visit: Payer: Self-pay | Admitting: Family Medicine

## 2019-07-29 ENCOUNTER — Telehealth: Payer: Self-pay | Admitting: Internal Medicine

## 2019-07-29 NOTE — Telephone Encounter (Signed)
Patient called. Stated she is receiving calls about getting vaccine. Patient has already received both doses. Patient is asking not to receive calls regarding vaccine.

## 2019-07-29 NOTE — Telephone Encounter (Signed)
Red team, can you clarify with patient what dose of carvedilol she is currently taking? The dose on her medication list is different than the dose requested. Thanks [name], MD

## 2019-07-29 NOTE — Telephone Encounter (Signed)
Unable to speak  with patient to remind of missed remote transmission 

## 2019-07-31 ENCOUNTER — Other Ambulatory Visit: Payer: Self-pay | Admitting: Family Medicine

## 2019-08-01 NOTE — Telephone Encounter (Signed)
LVM for patient to call office to determine dosage of Carvedilol.  [name], [place]

## 2019-08-01 NOTE — Telephone Encounter (Signed)
Patient calls nurse line and states that she has been taking two 3.125 mg tablets twice daily. Patient reports that this was changed by cardiologist in March.   To PCP  Talbot Grumbling, RN

## 2019-08-02 MED ORDER — CARVEDILOL 6.25 MG PO TABS: 6.2500 mg | ORAL_TABLET | Freq: Two times a day (BID) | ORAL | 1 refills | Status: DC

## 2019-08-03 ENCOUNTER — Other Ambulatory Visit: Payer: Self-pay | Admitting: *Deleted

## 2019-08-03 NOTE — Telephone Encounter (Signed)
Patient calls nurse line stating the pharmacy has nothing for her when she called them today, however her Carvedilol 6.25mg  was refilled on 4/30. I called pharmacy and they stated her Carvedilol 6.25mg  is ready for pick up. The patient has been updated.

## 2019-08-09 ENCOUNTER — Telehealth: Payer: Self-pay | Admitting: Internal Medicine

## 2019-08-09 NOTE — Telephone Encounter (Signed)
Attempted phone call to pt.  Left voicemail message for pt to contact RN on Thursday 08/11/2019 at [phone].

## 2019-08-09 NOTE — Telephone Encounter (Signed)
Patient is requesting to speak with a nurse in regards to whether or not she needs to bring her COVID-19 vaccination documentation to her COVID-19 test scheduled for 08/12/19. Please advise.

## 2019-08-12 ENCOUNTER — Other Ambulatory Visit: Payer: Self-pay

## 2019-08-12 ENCOUNTER — Other Ambulatory Visit (HOSPITAL_COMMUNITY)
Admission: RE | Admit: 2019-08-12 | Discharge: 2019-08-12 | Disposition: A | Discharge status: Home / Self Care | Payer: Medicare HMO | Source: Ambulatory Visit | Attending: Internal Medicine | Admitting: Internal Medicine

## 2019-08-12 ENCOUNTER — Other Ambulatory Visit: Payer: Medicare HMO | Admitting: *Deleted

## 2019-08-12 DIAGNOSIS — I428 Other cardiomyopathies: Secondary | ICD-10-CM

## 2019-08-12 DIAGNOSIS — Z20822 Contact with and (suspected) exposure to covid-19: Secondary | ICD-10-CM | POA: Insufficient documentation

## 2019-08-12 DIAGNOSIS — Z01812 Encounter for preprocedural laboratory examination: Secondary | ICD-10-CM

## 2019-08-12 DIAGNOSIS — I5022 Chronic systolic (congestive) heart failure: Secondary | ICD-10-CM | POA: Diagnosis not present

## 2019-08-12 LAB — BASIC METABOLIC PANEL
BUN/Creatinine Ratio: 14 (ref 12–28)
BUN: 10 mg/dL (ref 8–27)
CO2: 24 mmol/L (ref 20–29)
Calcium: 9.4 mg/dL (ref 8.7–10.3)
Chloride: 100 mmol/L (ref 96–106)
Creatinine, Ser: 0.71 mg/dL (ref 0.57–1.00)
GFR calc Af Amer: 89 mL/min/{1.73_m2} (ref >59)
GFR calc non Af Amer: 77 mL/min/{1.73_m2} (ref >59)
Glucose: 108 mg/dL — ABNORMAL HIGH (ref 65–99)
Potassium: 4.6 mmol/L (ref 3.5–5.2)
Sodium: 139 mmol/L (ref 134–144)

## 2019-08-12 LAB — CBC
Hematocrit: 38.9 % (ref 34.0–46.6)
Hemoglobin: 13.1 g/dL (ref 11.1–15.9)
MCH: 33.7 pg — ABNORMAL HIGH (ref 26.6–33.0)
MCHC: 33.7 g/dL (ref 31.5–35.7)
MCV: 100 fL — ABNORMAL HIGH (ref 79–97)
Platelets: 128 10*3/uL — ABNORMAL LOW (ref 150–450)
RBC: 3.89 x10E6/uL (ref 3.77–5.28)
RDW: 12.9 % (ref 11.7–15.4)
WBC: 6.5 10*3/uL (ref 3.4–10.8)

## 2019-08-12 LAB — SARS CORONAVIRUS 2 (TAT 6-24 HRS): SARS Coronavirus 2: NEGATIVE

## 2019-08-12 NOTE — Telephone Encounter (Signed)
Spoke with pt who states she received her Covid testing today and is set for her device generator change next week.  Pt had no additional questions at this time.

## 2019-08-16 ENCOUNTER — Other Ambulatory Visit: Payer: Self-pay

## 2019-08-16 ENCOUNTER — Encounter (HOSPITAL_COMMUNITY)
Admission: RE | Disposition: A | Discharge status: Home / Self Care | Payer: Self-pay | Source: Home / Self Care | Attending: Internal Medicine

## 2019-08-16 ENCOUNTER — Ambulatory Visit (HOSPITAL_COMMUNITY)
Admission: RE | Admit: 2019-08-16 | Discharge: 2019-08-16 | Disposition: A | Discharge status: Home / Self Care | Payer: Medicare HMO | Attending: Internal Medicine | Admitting: Internal Medicine

## 2019-08-16 DIAGNOSIS — Z79899 Other long term (current) drug therapy: Secondary | ICD-10-CM | POA: Diagnosis not present

## 2019-08-16 DIAGNOSIS — R000 Tachycardia, unspecified: Secondary | ICD-10-CM | POA: Diagnosis not present

## 2019-08-16 DIAGNOSIS — I428 Other cardiomyopathies: Secondary | ICD-10-CM | POA: Insufficient documentation

## 2019-08-16 DIAGNOSIS — I480 Paroxysmal atrial fibrillation: Secondary | ICD-10-CM | POA: Insufficient documentation

## 2019-08-16 DIAGNOSIS — R400 Somnolence: Secondary | ICD-10-CM | POA: Insufficient documentation

## 2019-08-16 DIAGNOSIS — Z45010 Encounter for checking and testing of cardiac pacemaker pulse generator [battery]: Secondary | ICD-10-CM | POA: Insufficient documentation

## 2019-08-16 DIAGNOSIS — M810 Age-related osteoporosis without current pathological fracture: Secondary | ICD-10-CM | POA: Insufficient documentation

## 2019-08-16 DIAGNOSIS — E785 Hyperlipidemia, unspecified: Secondary | ICD-10-CM | POA: Insufficient documentation

## 2019-08-16 DIAGNOSIS — R5383 Other fatigue: Secondary | ICD-10-CM | POA: Insufficient documentation

## 2019-08-16 DIAGNOSIS — I110 Hypertensive heart disease with heart failure: Secondary | ICD-10-CM | POA: Diagnosis not present

## 2019-08-16 DIAGNOSIS — Z87891 Personal history of nicotine dependence: Secondary | ICD-10-CM | POA: Diagnosis not present

## 2019-08-16 DIAGNOSIS — Z882 Allergy status to sulfonamides status: Secondary | ICD-10-CM | POA: Insufficient documentation

## 2019-08-16 DIAGNOSIS — I5022 Chronic systolic (congestive) heart failure: Secondary | ICD-10-CM | POA: Insufficient documentation

## 2019-08-16 HISTORY — PX: PPM GENERATOR CHANGEOUT: EP1233

## 2019-08-16 SURGERY — PPM GENERATOR CHANGEOUT

## 2019-08-16 MED ORDER — CEFAZOLIN SODIUM-DEXTROSE 2-4 GM/100ML-% IV SOLN
INTRAVENOUS | Status: AC
  Filled 2019-08-16: qty 100

## 2019-08-16 MED ORDER — MIDAZOLAM HCL 5 MG/5ML IJ SOLN
INTRAMUSCULAR | Status: AC
  Filled 2019-08-16: qty 5

## 2019-08-16 MED ORDER — LIDOCAINE HCL (PF) 1 % IJ SOLN
INTRAMUSCULAR | Status: DC | PRN
  Administered 2019-08-16: 50 mL

## 2019-08-16 MED ORDER — SODIUM CHLORIDE 0.9 % IV SOLN
INTRAVENOUS | Status: AC
  Filled 2019-08-16: qty 2

## 2019-08-16 MED ORDER — FENTANYL CITRATE (PF) 100 MCG/2ML IJ SOLN
INTRAMUSCULAR | Status: DC | PRN
  Administered 2019-08-16: 12.5 ug via INTRAVENOUS

## 2019-08-16 MED ORDER — CEFAZOLIN SODIUM-DEXTROSE 2-4 GM/100ML-% IV SOLN
2.0000 g | INTRAVENOUS | Status: AC
  Administered 2019-08-16: 2 g via INTRAVENOUS

## 2019-08-16 MED ORDER — MIDAZOLAM HCL 5 MG/5ML IJ SOLN
INTRAMUSCULAR | Status: DC | PRN
  Administered 2019-08-16 (×2): 1 mg via INTRAVENOUS

## 2019-08-16 MED ORDER — CHLORHEXIDINE GLUCONATE 4 % EX LIQD
4.0000 "application " | Freq: Once | CUTANEOUS | Status: DC
  Filled 2019-08-16 (×2): qty 60

## 2019-08-16 MED ORDER — SODIUM CHLORIDE 0.9 % IV SOLN: INTRAVENOUS | Status: DC

## 2019-08-16 MED ORDER — FENTANYL CITRATE (PF) 100 MCG/2ML IJ SOLN
INTRAMUSCULAR | Status: AC
  Filled 2019-08-16: qty 2

## 2019-08-16 MED ORDER — ONDANSETRON HCL 4 MG/2ML IJ SOLN: 4.0000 mg | Freq: Four times a day (QID) | INTRAMUSCULAR | Status: DC | PRN

## 2019-08-16 MED ORDER — LIDOCAINE HCL 1 % IJ SOLN
INTRAMUSCULAR | Status: AC
  Filled 2019-08-16: qty 60

## 2019-08-16 MED ORDER — SODIUM CHLORIDE 0.9 % IV SOLN
80.0000 mg | INTRAVENOUS | Status: AC
  Administered 2019-08-16: 80 mg
  Filled 2019-08-16: qty 2

## 2019-08-16 MED ORDER — ACETAMINOPHEN 325 MG PO TABS: 325.0000 mg | ORAL_TABLET | ORAL | Status: DC | PRN

## 2019-08-16 SURGICAL SUPPLY — 6 items
CABLE SURGICAL S-101-97-12 (CABLE) ×3 IMPLANT
PACEMAKER QUDR ALLR CRT PM3562 (Pacemaker) ×1 IMPLANT
PAD PRO RADIOLUCENT 2001M-C (PAD) ×3 IMPLANT
PMKR QUADRA ALLURE CRT PM3562 (Pacemaker) ×3 IMPLANT
POUCH AIGIS-R ANTIBACT ICD (Mesh General) ×3 IMPLANT
TRAY PACEMAKER INSERTION (PACKS) ×3 IMPLANT

## 2019-08-16 NOTE — Discharge Instructions (Signed)
Supplemental Discharge Instructions for  Dermabond will come off in next 10-14 days; if not will remove at wound check  Keep wound dry until tomorrow evening No Driving x 4 days Wound check in office as scheduled   Pacemaker/Defibrillator Patients  Activity Do not raise your left/right arm above shoulder level or extend it backward beyond shoulder level for 2 weeks. Wear the arm sling as a reminder or as needed for comfort for 2 weeks. No heavy lifting or vigorous activity with your left/right arm for 6-8 weeks.    NO DRIVING is preferable for 4 days; If absolutely necessary, drive only short, familiar routes. DO wear your seatbelt, even if it crosses over the pacemaker site.  WOUND CARE - Keep the wound area clean and dry.  Remove the dressing the day after you return home (usually 48 hours after the procedure). - DO NOT SUBMERGE UNDER WATER UNTIL FULLY HEALED (no tub baths, hot tubs, swimming pools, etc.).  - You  may shower or take a sponge bath after the dressing is removed. DO NOT SOAK the area and do not allow the shower to directly spray on the site. - If you have staples, these will be removed in the office in 7-14 days. - If you have tape/steri-strips on your wound, these will fall off; do not pull them off prematurely.   - No bandage is needed on the site.  DO  NOT apply any creams, oils, or ointments to the wound area. - If you notice any drainage or discharge from the wound, any swelling, excessive redness or bruising at the site, or if you develop a fever > 101? F after you are discharged home, call the office at once.  Special Instructions - You are still able to use cellular telephones.  Avoid carrying your cellular phone near your device. - When traveling through airports, show security personnel your identification card to avoid being screened in the metal detectors.  - Avoid arc welding equipment, MRI testing (magnetic resonance imaging), TENS units (transcutaneous nerve  stimulators).  Call the office for questions about other devices. - Avoid electrical appliances that are in poor condition or are not properly grounded. - Microwave ovens are safe to be near or to operate.  Additional information for defibrillator patients should your device go off: - If your device goes off ONCE and you feel fine afterward, notify the clinic at [phone]. - If your device goes off ONCE and you do not feel well afterward, call 911. - If your device goes off TWICE or more in one day, call 911.  DO NOT DRIVE YOURSELF OR A FAMILY MEMBER WITH A DEFIBRILLATOR TO THE HOSPITAL--CALL 911.

## 2019-08-16 NOTE — H&P (Signed)
Patient Care Team: [name], MD as PCP - General (Family Medicine) Deboraha Sprang, MD as Consulting Physician (Cardiology)   HPI  [name] is a 86 y.o. female Admitted for replacement of  CRT with LV lead dislodgement confirmed by CXR  in December she underwent downgrade of her device for ICD-->> and insertion of a new LV lead.  Efforts for afterload reduction complicated by hypotension.  She underwent cath for the above and chest pain>>LV dysfunction mod 35-40% with Mlild MR  Echo 1/17 EF 25-30%    Date Cr K Hgb  3/18  0.86 4.3 13.6(2/17)   5/21 0.71 4.6 13.1   Device History: ICD implanted primary prevention     Date Gen Lead Status   2007 ICD ( at [place])    2012 ICD  LV lead dislodged   12/14 St Jude (CRT-P) LV lead         Stable dyspnea, but no edema  No chest pain   Records and Results Reviewed   Past Medical History:  Diagnosis Date  . Biventricular ICD (implantable cardioverter-defibrillator)-St. Jude    Date of implant 2007 generator change 2012  . Hyperlipidemia   . Hypertension   . Macro dislodgment of the left ventricular lead 09/09/2012  . Nonischemic cardiomyopathy ([place])    Cath without coronary disease 2006  . Osteoporosis   . PAF (paroxysmal atrial fibrillation) (HCC)    Short bursts of PAF. Asymptomatic.  Sees Dr Rex Kras.  . Peripheral neuropathy, hereditary/idiopathic 2012  . Raynaud phenomenon   . Sinus tachycardia   . Systolic CHF ([place])   . Urge incontinence     Past Surgical History:  Procedure Laterality Date  . ABDOMINAL HYSTERECTOMY  1970s  . APPENDECTOMY    . BI-VENTRICULAR PACEMAKER INSERTION N/A 03/28/2013   Procedure: BI-VENTRICULAR PACEMAKER INSERTION (CRT-P);  Surgeon: Deboraha Sprang, MD;  Location: [facility] CATH LAB;  Service: Cardiovascular;  Laterality: N/A;  . Biventricular ICD Implant  06/28/2010; 03/28/2013   STJ CRTD implanted 2012 with dislodgement of LV lead 2014; 03/28/2013 CRTP placed with new  LV lead by Dr Caryl Comes  . Bladder tack    . CARDIAC CATHETERIZATION  03/2005   EF 25%, Normal arteries, Nonischemic CM  . HIP ARTHROPLASTY Left 12/20/2013   Procedure: LEFT HIP HEMI ARTHROPLASTY;  Surgeon: Meredith Pel, MD;  Location: [facility];  Service: Orthopedics;  Laterality: Left;  . LEFT AND RIGHT HEART CATHETERIZATION WITH CORONARY ANGIOGRAM N/A 12/13/2012   Procedure: LEFT AND RIGHT HEART CATHETERIZATION WITH CORONARY ANGIOGRAM;  Surgeon: Blane Ohara, MD;  Location: [facility] CATH LAB;  Service: Cardiovascular;  Laterality: N/A;  . NM MYOCAR PERF WALL MOTION  03/19/2010   EF 63%, low risk  . Suprapubic Urethrovesicular Suspension  1992  . TOTAL HIP ARTHROPLASTY Right 09/04/2014   Procedure: BIPOLAR HIP ARTHROPLASTY ANTERIOR APPROACH;  Surgeon: Mcarthur Rossetti, MD;  Location: [facility];  Service: Orthopedics;  Laterality: Right;  . US ECHOCARDIOGRAPHY  11/18/2011   EF 40%, LA moderately dilated, mild AI    Current Facility-Administered Medications  Medication Dose Route Frequency Provider Last Rate Last Admin  . 0.9 %  sodium chloride infusion   Intravenous Continuous Deboraha Sprang, MD 50 mL/hr at 08/16/19 0554 New Bag at 08/16/19 0554  . ceFAZolin (ANCEF) IVPB 2g/100 mL premix  2 g Intravenous On Call Deboraha Sprang, MD      . chlorhexidine (HIBICLENS) 4 % liquid 4 application  4  application Topical Once Deboraha Sprang, MD      . gentamicin (GARAMYCIN) 80 mg in sodium chloride 0.9 % 500 mL irrigation  80 mg Irrigation On Call Deboraha Sprang, MD        Allergies  Allergen Reactions  . Sulfamethoxazole Swelling      Social History   Tobacco Use  . Smoking status: Former Smoker    Packs/day: 1.00    Years: 15.00    Pack years: 15.00    Quit date: 04/22/2002    Years since quitting: 17.3  . Smokeless tobacco: Never Used  Substance Use Topics  . 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 Quit 1979.  . Drug use: No    Comment: Prescription drug addict x  41 years.     Family History  Problem Relation Age of Onset  . Heart disease Mother   . Heart disease Father   . Cancer Father        [place]  . Arthritis Sister   . Heart disease Brother   . Diabetes Son   . Mental retardation Son   . Alcohol abuse Sister   . Cancer Brother   . Cancer Daughter   . Heart disease Daughter      Current Meds  Medication Sig  . acetaminophen (TYLENOL) 650 MG CR tablet Take 1,300 mg by mouth in the morning and at bedtime.   . carvedilol (COREG) 3.125 MG tablet Take 3.125 mg by mouth 2 (two) times daily with a meal.  . Multiple Vitamins-Minerals (MULTIVITAMIN WITH MINERALS) tablet Take 1 tablet by mouth daily.  Marland Kitchen torsemide (DEMADEX) 20 MG tablet TAKE 2 TABLETS(40 MG) BY MOUTH DAILY (Patient taking differently: Take 40 mg by mouth daily. )     Review of Systems negative except from HPI and PMH  Physical Exam BP (!) 124/58   Pulse 70   Temp (!) 97.5 F (36.4 C) (Oral)   Resp 14   Ht 5\' 3"  (1.6 m)   Wt 49.9 kg   SpO2 98%   BMI 19.49 kg/m  Well developed and well nourished in no acute distress HENT normal E scleral and icterus clear Neck Supple JVP 6-8 carotids brisk and full Clear to ausculation Regular rate and rhythm, no murmurs gallops or rub Soft with active bowel sounds No clubbing cyanosis   Edema Alert and oriented, grossly normal motor and sensory function Skin Warm and Dry    Assessment and  Plan Nonischemic cardiomyopathy  Congestive heart failure-chronic systolic  Implantable device-CRT>>> defibrillator-->pacemaker    Sinus tachycardia  Fatigue/daytime somnolence   We have reviewed the benefits and risks of generator replacement.  These include but are not limited to lead fracture and infection.  The patient understands, agrees and is willing to proceed.     Will use antimicrobial pouch

## 2019-08-17 MED FILL — Lidocaine HCl Local Inj 1%: INTRAMUSCULAR | Qty: 60 | Status: AC

## 2019-08-18 LAB — AEROBIC CULTURE W GRAM STAIN (SUPERFICIAL SPECIMEN): Culture: NO GROWTH

## 2019-08-30 ENCOUNTER — Other Ambulatory Visit: Payer: Self-pay

## 2019-08-30 ENCOUNTER — Ambulatory Visit (INDEPENDENT_AMBULATORY_CARE_PROVIDER_SITE_OTHER): Payer: Medicare HMO | Admitting: Emergency Medicine

## 2019-08-30 DIAGNOSIS — Z95810 Presence of automatic (implantable) cardiac defibrillator: Secondary | ICD-10-CM

## 2019-08-30 DIAGNOSIS — I428 Other cardiomyopathies: Secondary | ICD-10-CM

## 2019-08-31 LAB — CUP PACEART INCLINIC DEVICE CHECK
Battery Remaining Longevity: 88 mo
Battery Voltage: 3.01 V
Brady Statistic RA Percent Paced: 0.13 %
Brady Statistic RV Percent Paced: 94 %
Date Time Interrogation Session: 20210601110400
Implantable Lead Implant Date: 20070712
Implantable Lead Implant Date: 20070712
Implantable Lead Implant Date: 20141229
Implantable Lead Location: 753858
Implantable Lead Location: 753859
Implantable Lead Location: 753860
Implantable Lead Model: 7020
Implantable Pulse Generator Implant Date: 20210518
Lead Channel Impedance Value: 325 Ohm
Lead Channel Impedance Value: 437.5 Ohm
Lead Channel Impedance Value: 475 Ohm
Lead Channel Pacing Threshold Amplitude: 0.75 V
Lead Channel Pacing Threshold Amplitude: 0.75 V
Lead Channel Pacing Threshold Amplitude: 1.625 V
Lead Channel Pacing Threshold Pulse Width: 0.4 ms
Lead Channel Pacing Threshold Pulse Width: 0.4 ms
Lead Channel Pacing Threshold Pulse Width: 0.7 ms
Lead Channel Sensing Intrinsic Amplitude: 2.6 mV
Lead Channel Sensing Intrinsic Amplitude: 9.3 mV
Lead Channel Setting Pacing Amplitude: 2 V
Lead Channel Setting Pacing Amplitude: 2 V
Lead Channel Setting Pacing Amplitude: 2.125
Lead Channel Setting Pacing Pulse Width: 0.4 ms
Lead Channel Setting Pacing Pulse Width: 0.7 ms
Lead Channel Setting Sensing Sensitivity: 2 mV
Pulse Gen Model: 3562
Pulse Gen Serial Number: 3818460

## 2019-08-31 NOTE — Progress Notes (Signed)
Wound check appointment. Dermabond removed. Wound without redness or edema. Incision edges approximated, wound well healed. Normal device function. Thresholds, sensing, and impedances consistent with implant measurements. Device programmed at chronic settings due to mature leads. Histogram distribution appropriate for patient and level of activity. 3 AMS episodes that appear to be AT. No high ventricular rates noted. Patient educated about wound care, arm mobility, lifting restrictions. ROV with DR Caryl Comes on 11/22/19.Next remote transmission 11/15/19 and every 3 months after.

## 2019-09-05 DIAGNOSIS — C44310 Basal cell carcinoma of skin of unspecified parts of face: Secondary | ICD-10-CM | POA: Diagnosis not present

## 2019-09-05 DIAGNOSIS — Z961 Presence of intraocular lens: Secondary | ICD-10-CM | POA: Diagnosis not present

## 2019-09-05 DIAGNOSIS — D3131 Benign neoplasm of right choroid: Secondary | ICD-10-CM | POA: Diagnosis not present

## 2019-09-05 DIAGNOSIS — H35372 Puckering of macula, left eye: Secondary | ICD-10-CM | POA: Diagnosis not present

## 2019-09-05 DIAGNOSIS — H35363 Drusen (degenerative) of macula, bilateral: Secondary | ICD-10-CM | POA: Diagnosis not present

## 2019-09-22 DIAGNOSIS — L820 Inflamed seborrheic keratosis: Secondary | ICD-10-CM | POA: Diagnosis not present

## 2019-09-22 DIAGNOSIS — D485 Neoplasm of uncertain behavior of skin: Secondary | ICD-10-CM | POA: Diagnosis not present

## 2019-11-15 ENCOUNTER — Ambulatory Visit: Payer: Medicare HMO

## 2019-11-22 ENCOUNTER — Encounter: Payer: Medicare HMO | Admitting: Internal Medicine

## 2019-11-26 ENCOUNTER — Other Ambulatory Visit: Payer: Self-pay | Admitting: Family Medicine

## 2019-11-29 ENCOUNTER — Other Ambulatory Visit: Payer: Self-pay | Admitting: Family Medicine

## 2019-11-29 NOTE — Telephone Encounter (Signed)
LMOVM for pt to call back and confirm. [name] Kennon Holter, CMA

## 2019-11-29 NOTE — Telephone Encounter (Signed)
Red team, can you confirm whether patient is still taking this? It's not on her medication list presently. Thanks [name], MD

## 2019-11-29 NOTE — Telephone Encounter (Signed)
Patient returns call to nurse line. Patient reports she does take this medication (baclofen,) however reports "rarely." Patient states she takes 1 tablet 1-2 days per week when her "legs are acting up." Patient reports she does not need a refill at this time, "I have plenty." Patient advised to call us when she does.

## 2019-12-02 ENCOUNTER — Encounter: Payer: Medicare HMO | Admitting: Internal Medicine

## 2019-12-12 ENCOUNTER — Telehealth: Payer: Self-pay

## 2019-12-12 ENCOUNTER — Ambulatory Visit (INDEPENDENT_AMBULATORY_CARE_PROVIDER_SITE_OTHER): Payer: Medicare HMO | Admitting: *Deleted

## 2019-12-12 ENCOUNTER — Telehealth: Payer: Self-pay | Admitting: Internal Medicine

## 2019-12-12 DIAGNOSIS — I428 Other cardiomyopathies: Secondary | ICD-10-CM

## 2019-12-12 LAB — CUP PACEART REMOTE DEVICE CHECK
Battery Remaining Longevity: 90 mo
Battery Remaining Longevity: 92 mo
Battery Remaining Percentage: 95.5 %
Battery Remaining Percentage: 95.5 %
Battery Voltage: 3.01 V
Battery Voltage: 3.01 V
Brady Statistic AP VP Percent: 3.6 %
Brady Statistic AP VP Percent: 3.4 %
Brady Statistic AP VS Percent: 1 %
Brady Statistic AP VS Percent: 1 %
Brady Statistic AS VP Percent: 90 %
Brady Statistic AS VP Percent: 90 %
Brady Statistic AS VS Percent: 1.7 %
Brady Statistic AS VS Percent: 1.8 %
Brady Statistic RA Percent Paced: 1 %
Brady Statistic RA Percent Paced: 1 %
Date Time Interrogation Session: 20210818075710
Date Time Interrogation Session: 20210913110537
Implantable Lead Implant Date: 20070712
Implantable Lead Implant Date: 20070712
Implantable Lead Implant Date: 20141229
Implantable Lead Implant Date: 20070712
Implantable Lead Implant Date: 20070712
Implantable Lead Implant Date: 20141229
Implantable Lead Location: 753858
Implantable Lead Location: 753859
Implantable Lead Location: 753860
Implantable Lead Location: 753858
Implantable Lead Location: 753859
Implantable Lead Location: 753860
Implantable Lead Model: 7020
Implantable Lead Model: 7020
Implantable Pulse Generator Implant Date: 20210518
Implantable Pulse Generator Implant Date: 20210518
Lead Channel Impedance Value: 330 Ohm
Lead Channel Impedance Value: 450 Ohm
Lead Channel Impedance Value: 490 Ohm
Lead Channel Impedance Value: 330 Ohm
Lead Channel Impedance Value: 460 Ohm
Lead Channel Impedance Value: 550 Ohm
Lead Channel Pacing Threshold Amplitude: 0.75 V
Lead Channel Pacing Threshold Amplitude: 0.75 V
Lead Channel Pacing Threshold Amplitude: 1.25 V
Lead Channel Pacing Threshold Amplitude: 0.625 V
Lead Channel Pacing Threshold Amplitude: 0.75 V
Lead Channel Pacing Threshold Amplitude: 1.75 V
Lead Channel Pacing Threshold Pulse Width: 0.4 ms
Lead Channel Pacing Threshold Pulse Width: 0.4 ms
Lead Channel Pacing Threshold Pulse Width: 0.7 ms
Lead Channel Pacing Threshold Pulse Width: 0.4 ms
Lead Channel Pacing Threshold Pulse Width: 0.4 ms
Lead Channel Pacing Threshold Pulse Width: 0.7 ms
Lead Channel Sensing Intrinsic Amplitude: 12 mV
Lead Channel Sensing Intrinsic Amplitude: 2.7 mV
Lead Channel Sensing Intrinsic Amplitude: 1.8 mV
Lead Channel Sensing Intrinsic Amplitude: 12 mV
Lead Channel Setting Pacing Amplitude: 2 V
Lead Channel Setting Pacing Amplitude: 2 V
Lead Channel Setting Pacing Amplitude: 2 V
Lead Channel Setting Pacing Amplitude: 2 V
Lead Channel Setting Pacing Amplitude: 2 V
Lead Channel Setting Pacing Amplitude: 2.25 V
Lead Channel Setting Pacing Pulse Width: 0.4 ms
Lead Channel Setting Pacing Pulse Width: 0.7 ms
Lead Channel Setting Pacing Pulse Width: 0.4 ms
Lead Channel Setting Pacing Pulse Width: 0.7 ms
Lead Channel Setting Sensing Sensitivity: 2 mV
Lead Channel Setting Sensing Sensitivity: 2 mV
Pulse Gen Model: 3562
Pulse Gen Model: 3562
Pulse Gen Serial Number: 3818460
Pulse Gen Serial Number: 3818460

## 2019-12-12 NOTE — Telephone Encounter (Signed)
I helped the pt send her transmission and added her to the schedule.

## 2019-12-12 NOTE — Telephone Encounter (Signed)
Patient is calling to see if we received her remote tranmission. Spoke with Kem who advised that we have not received it. Kem wished to speak with the patient connected call.

## 2019-12-14 NOTE — Progress Notes (Signed)
Remote pacemaker transmission.   

## 2020-03-09 ENCOUNTER — Telehealth: Payer: Self-pay | Admitting: Family Medicine

## 2020-03-09 NOTE — Telephone Encounter (Signed)
Spoke with patient, she confirmed COVID booster on 9/29 at [facility], entered under historical immunizations.

## 2020-03-12 ENCOUNTER — Ambulatory Visit (INDEPENDENT_AMBULATORY_CARE_PROVIDER_SITE_OTHER): Payer: Medicare HMO

## 2020-03-12 DIAGNOSIS — I428 Other cardiomyopathies: Secondary | ICD-10-CM | POA: Diagnosis not present

## 2020-03-12 DIAGNOSIS — I5022 Chronic systolic (congestive) heart failure: Secondary | ICD-10-CM

## 2020-03-12 LAB — CUP PACEART REMOTE DEVICE CHECK
Battery Remaining Longevity: 94 mo
Battery Remaining Percentage: 95.5 %
Battery Voltage: 2.99 V
Brady Statistic AP VP Percent: 2.9 %
Brady Statistic AP VS Percent: 1 %
Brady Statistic AS VP Percent: 91 %
Brady Statistic AS VS Percent: 1.8 %
Brady Statistic RA Percent Paced: 1 %
Date Time Interrogation Session: 20211213073312
Implantable Lead Implant Date: 20070712
Implantable Lead Implant Date: 20070712
Implantable Lead Implant Date: 20141229
Implantable Lead Location: 753858
Implantable Lead Location: 753859
Implantable Lead Location: 753860
Implantable Lead Model: 7020
Implantable Pulse Generator Implant Date: 20210518
Lead Channel Impedance Value: 330 Ohm
Lead Channel Impedance Value: 530 Ohm
Lead Channel Impedance Value: 540 Ohm
Lead Channel Pacing Threshold Amplitude: 0.75 V
Lead Channel Pacing Threshold Amplitude: 0.75 V
Lead Channel Pacing Threshold Amplitude: 1.125 V
Lead Channel Pacing Threshold Pulse Width: 0.4 ms
Lead Channel Pacing Threshold Pulse Width: 0.4 ms
Lead Channel Pacing Threshold Pulse Width: 0.7 ms
Lead Channel Sensing Intrinsic Amplitude: 12 mV
Lead Channel Sensing Intrinsic Amplitude: 2 mV
Lead Channel Setting Pacing Amplitude: 2 V
Lead Channel Setting Pacing Amplitude: 2 V
Lead Channel Setting Pacing Amplitude: 2 V
Lead Channel Setting Pacing Pulse Width: 0.4 ms
Lead Channel Setting Pacing Pulse Width: 0.7 ms
Lead Channel Setting Sensing Sensitivity: 2 mV
Pulse Gen Model: 3562
Pulse Gen Serial Number: 3818460

## 2020-03-27 NOTE — Progress Notes (Signed)
Remote pacemaker transmission.   

## 2020-06-11 ENCOUNTER — Ambulatory Visit (INDEPENDENT_AMBULATORY_CARE_PROVIDER_SITE_OTHER): Payer: Medicare HMO

## 2020-06-11 DIAGNOSIS — I428 Other cardiomyopathies: Secondary | ICD-10-CM | POA: Diagnosis not present

## 2020-06-12 LAB — CUP PACEART REMOTE DEVICE CHECK
Battery Remaining Longevity: 91 mo
Battery Remaining Percentage: 95.5 %
Battery Voltage: 2.99 V
Brady Statistic AP VP Percent: 2.2 %
Brady Statistic AP VS Percent: 1 %
Brady Statistic AS VP Percent: 92 %
Brady Statistic AS VS Percent: 1.9 %
Brady Statistic RA Percent Paced: 1 %
Date Time Interrogation Session: 20220314170338
Implantable Lead Implant Date: 20070712
Implantable Lead Implant Date: 20070712
Implantable Lead Implant Date: 20141229
Implantable Lead Location: 753858
Implantable Lead Location: 753859
Implantable Lead Location: 753860
Implantable Lead Model: 7020
Implantable Pulse Generator Implant Date: 20210518
Lead Channel Impedance Value: 290 Ohm
Lead Channel Impedance Value: 440 Ohm
Lead Channel Impedance Value: 460 Ohm
Lead Channel Pacing Threshold Amplitude: 0.625 V
Lead Channel Pacing Threshold Amplitude: 0.75 V
Lead Channel Pacing Threshold Amplitude: 1.125 V
Lead Channel Pacing Threshold Pulse Width: 0.4 ms
Lead Channel Pacing Threshold Pulse Width: 0.4 ms
Lead Channel Pacing Threshold Pulse Width: 0.7 ms
Lead Channel Sensing Intrinsic Amplitude: 1.5 mV
Lead Channel Sensing Intrinsic Amplitude: 12 mV
Lead Channel Setting Pacing Amplitude: 2 V
Lead Channel Setting Pacing Amplitude: 2 V
Lead Channel Setting Pacing Amplitude: 2 V
Lead Channel Setting Pacing Pulse Width: 0.4 ms
Lead Channel Setting Pacing Pulse Width: 0.7 ms
Lead Channel Setting Sensing Sensitivity: 2 mV
Pulse Gen Model: 3562
Pulse Gen Serial Number: 3818460

## 2020-06-13 ENCOUNTER — Other Ambulatory Visit: Payer: Self-pay | Admitting: Family Medicine

## 2020-06-14 NOTE — Telephone Encounter (Signed)
Called and spoke with patient, she takes just one pill about three times a week Scheduled visit on 4/5 with me Patient appreciative [name], MD

## 2020-06-19 NOTE — Progress Notes (Signed)
Remote pacemaker transmission.   

## 2020-07-03 ENCOUNTER — Other Ambulatory Visit: Payer: Self-pay

## 2020-07-03 ENCOUNTER — Ambulatory Visit (INDEPENDENT_AMBULATORY_CARE_PROVIDER_SITE_OTHER): Payer: Medicare HMO

## 2020-07-03 ENCOUNTER — Ambulatory Visit (INDEPENDENT_AMBULATORY_CARE_PROVIDER_SITE_OTHER): Payer: Medicare HMO | Admitting: Family Medicine

## 2020-07-03 VITALS — BP 115/75 | HR 96 | Ht 65.0 in | Wt 109.0 lb

## 2020-07-03 DIAGNOSIS — Z23 Encounter for immunization: Secondary | ICD-10-CM | POA: Diagnosis not present

## 2020-07-03 DIAGNOSIS — R5383 Other fatigue: Secondary | ICD-10-CM

## 2020-07-03 DIAGNOSIS — I110 Hypertensive heart disease with heart failure: Secondary | ICD-10-CM | POA: Diagnosis not present

## 2020-07-03 DIAGNOSIS — I5022 Chronic systolic (congestive) heart failure: Secondary | ICD-10-CM | POA: Diagnosis not present

## 2020-07-03 DIAGNOSIS — I10 Essential (primary) hypertension: Secondary | ICD-10-CM

## 2020-07-03 MED ORDER — MIRTAZAPINE 7.5 MG PO TABS
7.5000 mg | ORAL_TABLET | Freq: Every day | ORAL | 1 refills | Status: DC
Start: 2020-07-03 — End: 2020-08-02

## 2020-07-03 NOTE — Progress Notes (Signed)
  Date of Visit: 07/03/2020   SUBJECTIVE:   HPI:  [name] presents today for routine follow up.  CHF - taking coreg 6.25mg  twice daily and torsemide 20mg  every other day. Denies shortness of breath or chest pain. Does have chronic swelling in legs, stable overall.  Fatigue - noticing she continues to feel very tired. Sleeps about 3 hours a night, sleep has always been an issue for her. Negative PHQ9 for depression. No pain in body, body just feels tired. Wonders if related to old age or something else. Does notice decreased appetite for many years. She drinks nutritional supplements to keep her nutrition up.  OBJECTIVE:   BP 115/75   Pulse 96   Ht 5\' 5"  (1.651 m)   Wt 109 lb (49.4 kg)   SpO2 99%   BMI 18.14 kg/m  Gen: no acute distress, pleasant cooperative HEENT: normocephalic, atraumatic  Heart: regular rate and rhythm, no murmur Lungs: clear to auscultation bilaterally, normal work of breathing  Neuro: alert, grossly nonfocal, speech normal Ext: 2-3+ chronic pitting edema bilaterally, stable  ASSESSMENT/PLAN:   Health maintenance:  -second COVID booster given today  Fatigue Suspect related to both age and disordered sleeping, given she is getting only 3h a night of sleep Will start remeron 7.5mg  at night to help with sleep and appetite Check labs - CMET, CBC, TSH today Follow up in 1 month  Chronic systolic heart failure (HCC) Stable. Continue current medications.   HYPERTENSION, BENIGN SYSTEMIC Well controlled. Continue current medication regimen.   FOLLOW UP: Follow up in 1 mo for fatigue  [place] J. [name], [place] than 30 minutes were spent on this encounter on the day of service, including pre-visit planning, actual face to face time, coordination of care, and documentation of visit.

## 2020-07-03 NOTE — Patient Instructions (Signed)
It was great to see you again today!  Checking labs today Start remeron 7.5mg  at night Follow up with me in 1 month  Be well, Dr. [name]

## 2020-07-03 NOTE — Assessment & Plan Note (Signed)
Stable.  Continue current medications.

## 2020-07-03 NOTE — Assessment & Plan Note (Signed)
Well controlled. Continue current medication regimen.  

## 2020-07-03 NOTE — Assessment & Plan Note (Signed)
Suspect related to both age and disordered sleeping, given she is getting only 3h a night of sleep Will start remeron 7.5mg  at night to help with sleep and appetite Check labs - CMET, CBC, TSH today Follow up in 1 month

## 2020-07-04 ENCOUNTER — Encounter: Payer: Self-pay | Admitting: Family Medicine

## 2020-07-04 LAB — CBC
Hematocrit: 44.6 % (ref 34.0–46.6)
Hemoglobin: 15.6 g/dL (ref 11.1–15.9)
MCH: 33.8 pg — ABNORMAL HIGH (ref 26.6–33.0)
MCHC: 35 g/dL (ref 31.5–35.7)
MCV: 97 fL (ref 79–97)
Platelets: 130 10*3/uL — ABNORMAL LOW (ref 150–450)
RBC: 4.62 x10E6/uL (ref 3.77–5.28)
RDW: 12.3 % (ref 11.7–15.4)
WBC: 7.1 10*3/uL (ref 3.4–10.8)

## 2020-07-04 LAB — CMP14+EGFR
ALT: 11 IU/L (ref 0–32)
AST: 24 IU/L (ref 0–40)
Albumin/Globulin Ratio: 2 (ref 1.2–2.2)
Albumin: 4.7 g/dL — ABNORMAL HIGH (ref 3.6–4.6)
Alkaline Phosphatase: 66 IU/L (ref 44–121)
BUN/Creatinine Ratio: 20 (ref 12–28)
BUN: 17 mg/dL (ref 8–27)
Bilirubin Total: 0.6 mg/dL (ref 0.0–1.2)
CO2: 24 mmol/L (ref 20–29)
Calcium: 10.1 mg/dL (ref 8.7–10.3)
Chloride: 101 mmol/L (ref 96–106)
Creatinine, Ser: 0.84 mg/dL (ref 0.57–1.00)
Globulin, Total: 2.3 g/dL (ref 1.5–4.5)
Glucose: 151 mg/dL — ABNORMAL HIGH (ref 65–99)
Potassium: 4.2 mmol/L (ref 3.5–5.2)
Sodium: 142 mmol/L (ref 134–144)
Total Protein: 7 g/dL (ref 6.0–8.5)
eGFR: 67 mL/min/{1.73_m2} (ref >59)

## 2020-07-04 LAB — TSH: TSH: 2.58 u[IU]/mL (ref 0.450–4.500)

## 2020-07-24 ENCOUNTER — Other Ambulatory Visit: Payer: Self-pay

## 2020-08-02 ENCOUNTER — Ambulatory Visit (INDEPENDENT_AMBULATORY_CARE_PROVIDER_SITE_OTHER): Payer: Medicare HMO | Admitting: Family Medicine

## 2020-08-02 ENCOUNTER — Encounter: Payer: Self-pay | Admitting: Family Medicine

## 2020-08-02 ENCOUNTER — Other Ambulatory Visit: Payer: Self-pay

## 2020-08-02 DIAGNOSIS — R5382 Chronic fatigue, unspecified: Secondary | ICD-10-CM

## 2020-08-02 DIAGNOSIS — I5022 Chronic systolic (congestive) heart failure: Secondary | ICD-10-CM | POA: Diagnosis not present

## 2020-08-02 DIAGNOSIS — I10 Essential (primary) hypertension: Secondary | ICD-10-CM

## 2020-08-02 MED ORDER — MIRTAZAPINE 15 MG PO TABS
15.0000 mg | ORAL_TABLET | Freq: Every day | ORAL | 1 refills | Status: DC
Start: 2020-08-02 — End: 2020-09-10

## 2020-08-02 NOTE — Assessment & Plan Note (Addendum)
Sleeping and fatigue have improved in last 4 weeks with addition of 7.5mg  Mirtazapine. Pt would like to continue this medication and after shared decision making conversation, will increase to 15mg  Mirtazapine.

## 2020-08-02 NOTE — Patient Instructions (Signed)
It was great to see you again today!  Go up on remeron to 15mg  at night Let me know how this does for you  Be well, Dr. [name]

## 2020-08-02 NOTE — Assessment & Plan Note (Signed)
Well controlled. BP in clinic today at 126/70.

## 2020-08-02 NOTE — Progress Notes (Signed)
    SUBJECTIVE:   CHIEF COMPLAINT / HPI:   Fatigue Regina LERETTE returns today for follow up and evaluation of her fatigue. Started 7.5mg  Remeron at night after our last visit on 07/03/20, as the pt has historically slept poorly- around 2-3 hours per night. She notes that she has been able to sleep up to 4 hours a night since beginning Remeron and endorses feeling less fatigued as a result. She has continued actively gardening during the day time and has enough energy to do this.  CHF The pt denies any new or worsened SOB. She continues on Coreg daily and Demadex every other day. She endorses stable pedal edema, today is her off day of Demadex.  HTN Not symptomatic. Well controlled in clinic today at 126/70.  PERTINENT  PMH / PSH:  CHF HTN  OBJECTIVE:   BP 126/70   Pulse (!) 111   Ht 5' (1.524 m)   Wt 111 lb 6.4 oz (50.5 kg)   SpO2 98%   BMI 21.76 kg/m   Physical Exam Constitutional:      Appearance: Normal appearance. She is not ill-appearing.  Cardiovascular:     Rate and Rhythm: Normal rate and regular rhythm.     Heart sounds: Normal heart sounds.  Pulmonary:     Effort: Pulmonary effort is normal.     Breath sounds: Normal breath sounds.  Musculoskeletal:     Right lower leg: 1+ Edema present.     Left lower leg: 1+ Edema present.  Neurological:     Mental Status: She is alert and oriented to person, place, and time.  Psychiatric:        Mood and Affect: Mood normal.        Behavior: Behavior normal.      ASSESSMENT/PLAN:   Fatigue Sleeping and fatigue have improved in last 4 weeks with addition of 7.5mg  Mirtazapine. Pt would like to continue this medication and after shared decision making conversation, will increase to 15mg  Mirtazapine.  Chronic systolic heart failure (HCC) Symptoms stable without SOB. Weight stable. Stable pedal edema. Continue Demadex and Coreg.  HYPERTENSION, BENIGN SYSTEMIC Well controlled. BP in clinic today at 126/70.     [place]    Patient seen along with MS3 student [place]. I personally evaluated this patient along with the student, and verified all aspects of the history, physical exam, and medical decision making as documented by the student. I agree with the student's documentation and have made all necessary edits.  Chrisandra Netters, MD  [place]

## 2020-08-02 NOTE — Assessment & Plan Note (Signed)
Symptoms stable without SOB. Weight stable. Stable pedal edema. Continue Demadex and Coreg.

## 2020-09-06 ENCOUNTER — Other Ambulatory Visit: Payer: Self-pay

## 2020-09-06 ENCOUNTER — Other Ambulatory Visit: Payer: Self-pay | Admitting: Family Medicine

## 2020-09-06 NOTE — Telephone Encounter (Signed)
Pt is requesting a refill on carvedilol. Would Dr. Caryl Comes like to refill this medication? Please address

## 2020-09-06 NOTE — Telephone Encounter (Signed)
Medication is managed by primary care provider.

## 2020-09-06 NOTE — Telephone Encounter (Signed)
Ppt walked in to request refill of:  Name of Medication(s):  Carvedilol 6.25 mg Last date of OV:  08/02/20 Pharmacy:  [facility]  Will route refill request to Clinic RN.  Discussed with patient policy to call pharmacy for future refills.  Also, discussed refills may take up to 48 hours to approve or deny.  Creig Hines

## 2020-09-07 ENCOUNTER — Other Ambulatory Visit: Payer: Self-pay | Admitting: Family Medicine

## 2020-09-07 MED ORDER — CARVEDILOL 6.25 MG PO TABS: 6.2500 mg | ORAL_TABLET | Freq: Two times a day (BID) | ORAL | 3 refills | Status: DC

## 2020-09-10 ENCOUNTER — Ambulatory Visit (INDEPENDENT_AMBULATORY_CARE_PROVIDER_SITE_OTHER): Payer: Medicare HMO

## 2020-09-10 DIAGNOSIS — I428 Other cardiomyopathies: Secondary | ICD-10-CM

## 2020-09-11 LAB — CUP PACEART REMOTE DEVICE CHECK
Battery Remaining Longevity: 91 mo
Battery Remaining Percentage: 95.5 %
Battery Voltage: 2.99 V
Brady Statistic AP VP Percent: 1.8 %
Brady Statistic AP VS Percent: 1 %
Brady Statistic AS VP Percent: 93 %
Brady Statistic AS VS Percent: 1.9 %
Brady Statistic RA Percent Paced: 1 %
Date Time Interrogation Session: 20220613100417
Implantable Lead Implant Date: 20070712
Implantable Lead Implant Date: 20070712
Implantable Lead Implant Date: 20141229
Implantable Lead Location: 753858
Implantable Lead Location: 753859
Implantable Lead Location: 753860
Implantable Lead Model: 7020
Implantable Pulse Generator Implant Date: 20210518
Lead Channel Impedance Value: 310 Ohm
Lead Channel Impedance Value: 480 Ohm
Lead Channel Impedance Value: 490 Ohm
Lead Channel Pacing Threshold Amplitude: 0.75 V
Lead Channel Pacing Threshold Amplitude: 0.875 V
Lead Channel Pacing Threshold Amplitude: 1 V
Lead Channel Pacing Threshold Pulse Width: 0.4 ms
Lead Channel Pacing Threshold Pulse Width: 0.4 ms
Lead Channel Pacing Threshold Pulse Width: 0.7 ms
Lead Channel Sensing Intrinsic Amplitude: 12 mV
Lead Channel Sensing Intrinsic Amplitude: 2.1 mV
Lead Channel Setting Pacing Amplitude: 2 V
Lead Channel Setting Pacing Amplitude: 2 V
Lead Channel Setting Pacing Amplitude: 2 V
Lead Channel Setting Pacing Pulse Width: 0.4 ms
Lead Channel Setting Pacing Pulse Width: 0.7 ms
Lead Channel Setting Sensing Sensitivity: 2 mV
Pulse Gen Model: 3562
Pulse Gen Serial Number: 3818460

## 2020-10-02 NOTE — Progress Notes (Signed)
Remote pacemaker transmission.   

## 2020-10-15 DIAGNOSIS — R600 Localized edema: Secondary | ICD-10-CM | POA: Diagnosis not present

## 2020-10-15 DIAGNOSIS — Z681 Body mass index (BMI) 19 or less, adult: Secondary | ICD-10-CM | POA: Diagnosis not present

## 2020-10-15 DIAGNOSIS — R32 Unspecified urinary incontinence: Secondary | ICD-10-CM | POA: Diagnosis not present

## 2020-10-15 DIAGNOSIS — K08109 Complete loss of teeth, unspecified cause, unspecified class: Secondary | ICD-10-CM | POA: Diagnosis not present

## 2020-10-15 DIAGNOSIS — M1990 Unspecified osteoarthritis, unspecified site: Secondary | ICD-10-CM | POA: Diagnosis not present

## 2020-10-15 DIAGNOSIS — R636 Underweight: Secondary | ICD-10-CM | POA: Diagnosis not present

## 2020-10-15 DIAGNOSIS — I10 Essential (primary) hypertension: Secondary | ICD-10-CM | POA: Diagnosis not present

## 2020-10-15 DIAGNOSIS — R69 Illness, unspecified: Secondary | ICD-10-CM | POA: Diagnosis not present

## 2020-10-15 DIAGNOSIS — Z809 Family history of malignant neoplasm, unspecified: Secondary | ICD-10-CM | POA: Diagnosis not present

## 2020-10-15 DIAGNOSIS — Z833 Family history of diabetes mellitus: Secondary | ICD-10-CM | POA: Diagnosis not present

## 2020-11-16 ENCOUNTER — Encounter: Payer: Self-pay | Admitting: Internal Medicine

## 2020-11-16 ENCOUNTER — Ambulatory Visit (INDEPENDENT_AMBULATORY_CARE_PROVIDER_SITE_OTHER): Payer: Medicare HMO | Admitting: Internal Medicine

## 2020-11-16 ENCOUNTER — Other Ambulatory Visit: Payer: Self-pay

## 2020-11-16 VITALS — BP 130/60 | HR 92 | Ht 60.0 in | Wt 108.6 lb

## 2020-11-16 DIAGNOSIS — Z95810 Presence of automatic (implantable) cardiac defibrillator: Secondary | ICD-10-CM

## 2020-11-16 DIAGNOSIS — I5022 Chronic systolic (congestive) heart failure: Secondary | ICD-10-CM | POA: Diagnosis not present

## 2020-11-16 DIAGNOSIS — I428 Other cardiomyopathies: Secondary | ICD-10-CM

## 2020-11-16 NOTE — Progress Notes (Signed)
Patient Care Team: [name], MD as PCP - General (Family Medicine) Deboraha Sprang, MD as Consulting Physician (Cardiology)   HPI  [name] is a 87 y.o. female seen in follow-up for CRT-.Saint Jude implanted remotely and changed out 5/21   She underwent cath for the above and chest pain>>LV dysfunction mod 35-40% with Mlild MR  Echo 1/17 EF 25-30%     Date Cr K Hgb  3/18  0.86 4.3 13.6(2/17)   5/21 0.71 4.6 13.1   4/22 0.84 4.2 15.6   DATE TEST EF   1/17 TTE  25-30 % AR Mild, MR Mod          Device History: ICD implanted primary prevention     Date Gen Lead Status   2007 ICD ( at [place])    2012 ICD  LV lead dislodged   12/14 St Jude (CRT-P) LV lead    5/21 St Jude (CRT-P   Gen change       Most of the time she believes her breathing is good, considering her situation. Dypsnea w modest effort. Edema but worse as not ake her torsemide this morning due to today's visit.  Occasionally she has lightheadedness when standing up in the morning. She will drink a small bottle of water to help with this.  The patient denies chest pain, or orthopnea.  There have been no palpitations, or syncope.  Complains of PND.      Records and Results Reviewed   Past Medical History:  Diagnosis Date   Biventricular ICD (implantable cardioverter-defibrillator)-St. Jude    Date of implant 2007 generator change 2012   Hyperlipidemia    Hypertension    Macro dislodgment of the left ventricular lead 09/09/2012   Nonischemic cardiomyopathy ([place])    Cath without coronary disease 2006   Osteoporosis    PAF (paroxysmal atrial fibrillation) (HCC)    Short bursts of PAF. Asymptomatic.  Sees Dr Rex Kras.   Peripheral neuropathy, hereditary/idiopathic 2012   Raynaud phenomenon    Sinus tachycardia    Systolic CHF [facility])    Urge incontinence     Past Surgical History:  Procedure Laterality Date   ABDOMINAL HYSTERECTOMY  1970s   APPENDECTOMY     BI-VENTRICULAR  PACEMAKER INSERTION N/A 03/28/2013   Procedure: BI-VENTRICULAR PACEMAKER INSERTION (CRT-P);  Surgeon: Deboraha Sprang, MD;  Location: [facility] CATH LAB;  Service: Cardiovascular;  Laterality: N/A;   Biventricular ICD Implant  06/28/2010; 03/28/2013   STJ CRTD implanted 2012 with dislodgement of LV lead 2014; 03/28/2013 CRTP placed with new LV lead by Dr Caryl Comes   Bladder tack     CARDIAC CATHETERIZATION  03/2005   EF 25%, Normal arteries, Nonischemic CM   HIP ARTHROPLASTY Left 12/20/2013   Procedure: LEFT HIP HEMI ARTHROPLASTY;  Surgeon: Meredith Pel, MD;  Location: [facility];  Service: Orthopedics;  Laterality: Left;   LEFT AND RIGHT HEART CATHETERIZATION WITH CORONARY ANGIOGRAM N/A 12/13/2012   Procedure: LEFT AND RIGHT HEART CATHETERIZATION WITH CORONARY ANGIOGRAM;  Surgeon: Blane Ohara, MD;  Location: [facility] CATH LAB;  Service: Cardiovascular;  Laterality: N/A;   NM MYOCAR PERF WALL MOTION  03/19/2010   EF 63%, low risk   PPM GENERATOR CHANGEOUT N/A 08/16/2019   Procedure: PPM GENERATOR CHANGEOUT;  Surgeon: Deboraha Sprang, MD;  Location: [facility] CV LAB;  Service: Cardiovascular;  Laterality: N/A;   Suprapubic Urethrovesicular Suspension  1992   TOTAL HIP ARTHROPLASTY Right 09/04/2014  Procedure: BIPOLAR HIP ARTHROPLASTY ANTERIOR APPROACH;  Surgeon: Mcarthur Rossetti, MD;  Location: [facility];  Service: Orthopedics;  Laterality: Right;   US ECHOCARDIOGRAPHY  11/18/2011   EF 40%, LA moderately dilated, mild AI    Current Outpatient Medications  Medication Sig Dispense Refill   acetaminophen (TYLENOL) 650 MG CR tablet Take 1,300 mg by mouth in the morning and at bedtime.     carvedilol (COREG) 6.25 MG tablet Take 1 tablet (6.25 mg total) by mouth 2 (two) times daily with a meal. 60 tablet 3   Cranberry 250 MG TABS Take by mouth in the morning and at bedtime.     mirtazapine (REMERON) 15 MG tablet TAKE 1 TABLET BY MOUTH AT BEDTIME 90 tablet 1   torsemide (DEMADEX) 20 MG tablet TAKE 1 TABLET BY  MOUTH EVERY OTHER DAY 45 tablet 2   Multiple Vitamins-Minerals (MULTIVITAMIN WITH MINERALS) tablet Take 1 tablet by mouth daily. (Patient not taking: Reported on 11/16/2020)     No current facility-administered medications for this visit.    Allergies  Allergen Reactions   Sulfamethoxazole Swelling           Review of Systems negative except from HPI and PMH  Physical Exam BP 130/60   Pulse 92   Ht 5' (1.524 m)   Wt 108 lb 9.6 oz (49.3 kg)   SpO2 97%   BMI 21.21 kg/m  Well developed and well nourished in no acute distress HENT normal Neck supple with JVP-flat Clear Device pocket well healed; without hematoma or erythema.  There is no tethering  Regular rate and rhythm, no  gallop 2/6 murmur Abd-soft with active BS No Clubbing cyanosis tr edema, but very big ankles skin-warm and dry A & Oriented  Grossly normal sensory and motor function  ECG  Sinus with P synchronous pacing with a rS in lead V1 and a qR in lead I    Assessment and  Plan Nonischemic cardiomyopathy   Congestive heart failure-chronic systolic   Implantable device-CRT>>> defibrillator-->pacemaker     Sinus tachycardia   Fatigue/daytime somnolence  Largely euvolemic.  The ankle flexible but hard to tell.  We will continue her torsemide to 20 every other day.  With her cardiomyopathy we will maintain carvedilol 6.25 twice daily blood pressure in the past has precluded further afterload reduction.     I,Mathew Stumpf,acting as a scribe for Virl Axe, MD.,have documented all relevant documentation on the behalf of Virl Axe, MD,as directed by  Virl Axe, MD while in the presence of Virl Axe, MD.  I, Virl Axe, MD, have reviewed all documentation for this visit. The documentation on 11/16/20 for the exam, diagnosis, procedures, and orders are all accurate and complete.

## 2020-11-16 NOTE — Patient Instructions (Signed)
Medication Instructions:  Your physician recommends that you continue on your current medications as directed. Please refer to the Current Medication list given to you today.  *If you need a refill on your cardiac medications before your next appointment, please call your pharmacy*   Lab Work: Your physician recommends that you continue on your current medications as directed. Please refer to the Current Medication list given to you today.   If you have labs (blood work) drawn today and your tests are completely normal, you will receive your results only by: [place] (if you have MyChart) OR A paper copy in the mail If you have any lab test that is abnormal or we need to change your treatment, we will call you to review the results.   Testing/Procedures: None ordered.    Follow-Up: At [facility] you and your health needs are our priority.  As part of our continuing mission to provide you with exceptional heart care, we have created designated Provider Care Teams.  These Care Teams include your primary Cardiologist (physician) and Advanced Practice Providers (APPs -  Physician Assistants and Nurse Practitioners) who all work together to provide you with the care you need, when you need it.  We recommend signing up for the patient portal called "MyChart".  Sign up information is provided on this After Visit Summary.  MyChart is used to connect with patients for Virtual Visits (Telemedicine).  Patients are able to view lab/test results, encounter notes, upcoming appointments, etc.  Non-urgent messages can be sent to your provider as well.   To learn more about what you can do with MyChart, go to [email or web address].    Your next appointment:   12 month(s)  The format for your next appointment:   In Person  Provider:   Virl Axe, MD

## 2020-12-10 ENCOUNTER — Ambulatory Visit (INDEPENDENT_AMBULATORY_CARE_PROVIDER_SITE_OTHER): Payer: Medicare HMO

## 2020-12-10 DIAGNOSIS — I428 Other cardiomyopathies: Secondary | ICD-10-CM

## 2020-12-11 LAB — CUP PACEART REMOTE DEVICE CHECK
Battery Remaining Longevity: 78 mo
Battery Remaining Percentage: 85 %
Battery Voltage: 2.99 V
Brady Statistic AP VP Percent: 3.8 %
Brady Statistic AP VS Percent: 1 %
Brady Statistic AS VP Percent: 89 %
Brady Statistic AS VS Percent: 3 %
Brady Statistic RA Percent Paced: 1 %
Date Time Interrogation Session: 20220912103752
Implantable Lead Implant Date: 20070712
Implantable Lead Implant Date: 20070712
Implantable Lead Implant Date: 20141229
Implantable Lead Location: 753858
Implantable Lead Location: 753859
Implantable Lead Location: 753860
Implantable Lead Model: 7020
Implantable Pulse Generator Implant Date: 20210518
Lead Channel Impedance Value: 330 Ohm
Lead Channel Impedance Value: 430 Ohm
Lead Channel Impedance Value: 460 Ohm
Lead Channel Pacing Threshold Amplitude: 0.625 V
Lead Channel Pacing Threshold Amplitude: 1 V
Lead Channel Pacing Threshold Amplitude: 1.25 V
Lead Channel Pacing Threshold Pulse Width: 0.4 ms
Lead Channel Pacing Threshold Pulse Width: 0.4 ms
Lead Channel Pacing Threshold Pulse Width: 0.7 ms
Lead Channel Sensing Intrinsic Amplitude: 1.2 mV
Lead Channel Sensing Intrinsic Amplitude: 12 mV
Lead Channel Setting Pacing Amplitude: 2 V
Lead Channel Setting Pacing Amplitude: 2 V
Lead Channel Setting Pacing Amplitude: 2 V
Lead Channel Setting Pacing Pulse Width: 0.4 ms
Lead Channel Setting Pacing Pulse Width: 0.7 ms
Lead Channel Setting Sensing Sensitivity: 2 mV
Pulse Gen Model: 3562
Pulse Gen Serial Number: 3818460

## 2020-12-18 NOTE — Progress Notes (Signed)
Remote pacemaker transmission.   

## 2021-01-02 ENCOUNTER — Other Ambulatory Visit: Payer: Self-pay | Admitting: Family Medicine

## 2021-01-04 ENCOUNTER — Other Ambulatory Visit: Payer: Self-pay | Admitting: Family Medicine

## 2021-01-23 ENCOUNTER — Other Ambulatory Visit: Payer: Self-pay | Admitting: Family Medicine

## 2021-01-24 ENCOUNTER — Other Ambulatory Visit: Payer: Self-pay

## 2021-01-25 MED ORDER — TORSEMIDE 20 MG PO TABS: 20.0000 mg | ORAL_TABLET | ORAL | 2 refills | Status: DC

## 2021-03-11 ENCOUNTER — Ambulatory Visit (INDEPENDENT_AMBULATORY_CARE_PROVIDER_SITE_OTHER): Payer: Medicare HMO

## 2021-03-11 DIAGNOSIS — I428 Other cardiomyopathies: Secondary | ICD-10-CM

## 2021-03-12 LAB — CUP PACEART REMOTE DEVICE CHECK
Battery Remaining Longevity: 74 mo
Battery Remaining Percentage: 82 %
Battery Voltage: 2.99 V
Brady Statistic AP VP Percent: 3.7 %
Brady Statistic AP VS Percent: 1 %
Brady Statistic AS VP Percent: 87 %
Brady Statistic AS VS Percent: 3.1 %
Brady Statistic RA Percent Paced: 1 %
Date Time Interrogation Session: 20221213114913
Implantable Lead Implant Date: 20070712
Implantable Lead Implant Date: 20070712
Implantable Lead Implant Date: 20141229
Implantable Lead Location: 753858
Implantable Lead Location: 753859
Implantable Lead Location: 753860
Implantable Lead Model: 7020
Implantable Pulse Generator Implant Date: 20210518
Lead Channel Impedance Value: 300 Ohm
Lead Channel Impedance Value: 440 Ohm
Lead Channel Impedance Value: 450 Ohm
Lead Channel Pacing Threshold Amplitude: 0.625 V
Lead Channel Pacing Threshold Amplitude: 1 V
Lead Channel Pacing Threshold Amplitude: 1 V
Lead Channel Pacing Threshold Pulse Width: 0.4 ms
Lead Channel Pacing Threshold Pulse Width: 0.4 ms
Lead Channel Pacing Threshold Pulse Width: 0.7 ms
Lead Channel Sensing Intrinsic Amplitude: 1.2 mV
Lead Channel Sensing Intrinsic Amplitude: 12 mV
Lead Channel Setting Pacing Amplitude: 2 V
Lead Channel Setting Pacing Amplitude: 2 V
Lead Channel Setting Pacing Amplitude: 2 V
Lead Channel Setting Pacing Pulse Width: 0.4 ms
Lead Channel Setting Pacing Pulse Width: 0.7 ms
Lead Channel Setting Sensing Sensitivity: 2 mV
Pulse Gen Model: 3562
Pulse Gen Serial Number: 3818460

## 2021-03-21 NOTE — Progress Notes (Signed)
Remote pacemaker transmission.   

## 2021-04-04 ENCOUNTER — Other Ambulatory Visit: Payer: Self-pay | Admitting: Family Medicine

## 2021-05-24 DIAGNOSIS — I509 Heart failure, unspecified: Secondary | ICD-10-CM | POA: Diagnosis not present

## 2021-05-24 DIAGNOSIS — R69 Illness, unspecified: Secondary | ICD-10-CM | POA: Diagnosis not present

## 2021-05-24 DIAGNOSIS — I429 Cardiomyopathy, unspecified: Secondary | ICD-10-CM | POA: Diagnosis not present

## 2021-05-24 DIAGNOSIS — Z008 Encounter for other general examination: Secondary | ICD-10-CM | POA: Diagnosis not present

## 2021-05-24 DIAGNOSIS — R32 Unspecified urinary incontinence: Secondary | ICD-10-CM | POA: Diagnosis not present

## 2021-05-24 DIAGNOSIS — I739 Peripheral vascular disease, unspecified: Secondary | ICD-10-CM | POA: Diagnosis not present

## 2021-05-24 DIAGNOSIS — R030 Elevated blood-pressure reading, without diagnosis of hypertension: Secondary | ICD-10-CM | POA: Diagnosis not present

## 2021-05-24 DIAGNOSIS — G4700 Insomnia, unspecified: Secondary | ICD-10-CM | POA: Diagnosis not present

## 2021-05-24 DIAGNOSIS — Z8249 Family history of ischemic heart disease and other diseases of the circulatory system: Secondary | ICD-10-CM | POA: Diagnosis not present

## 2021-05-24 DIAGNOSIS — E261 Secondary hyperaldosteronism: Secondary | ICD-10-CM | POA: Diagnosis not present

## 2021-05-24 DIAGNOSIS — Z809 Family history of malignant neoplasm, unspecified: Secondary | ICD-10-CM | POA: Diagnosis not present

## 2021-05-24 DIAGNOSIS — I2510 Atherosclerotic heart disease of native coronary artery without angina pectoris: Secondary | ICD-10-CM | POA: Diagnosis not present

## 2021-06-10 ENCOUNTER — Ambulatory Visit (INDEPENDENT_AMBULATORY_CARE_PROVIDER_SITE_OTHER): Payer: Medicare HMO

## 2021-06-10 DIAGNOSIS — I428 Other cardiomyopathies: Secondary | ICD-10-CM

## 2021-06-11 ENCOUNTER — Other Ambulatory Visit: Payer: Self-pay | Admitting: Family Medicine

## 2021-06-11 LAB — CUP PACEART REMOTE DEVICE CHECK
Battery Remaining Longevity: 73 mo
Battery Remaining Percentage: 78 %
Battery Voltage: 2.99 V
Brady Statistic AP VP Percent: 3.1 %
Brady Statistic AP VS Percent: 1 %
Brady Statistic AS VP Percent: 89 %
Brady Statistic AS VS Percent: 2.4 %
Brady Statistic RA Percent Paced: 1 %
Date Time Interrogation Session: 20230313130041
Implantable Lead Implant Date: 20070712
Implantable Lead Implant Date: 20070712
Implantable Lead Implant Date: 20141229
Implantable Lead Location: 753858
Implantable Lead Location: 753859
Implantable Lead Location: 753860
Implantable Lead Model: 7020
Implantable Pulse Generator Implant Date: 20210518
Lead Channel Impedance Value: 300 Ohm
Lead Channel Impedance Value: 430 Ohm
Lead Channel Impedance Value: 490 Ohm
Lead Channel Pacing Threshold Amplitude: 0.75 V
Lead Channel Pacing Threshold Amplitude: 1 V
Lead Channel Pacing Threshold Amplitude: 1.125 V
Lead Channel Pacing Threshold Pulse Width: 0.4 ms
Lead Channel Pacing Threshold Pulse Width: 0.4 ms
Lead Channel Pacing Threshold Pulse Width: 0.7 ms
Lead Channel Sensing Intrinsic Amplitude: 1.5 mV
Lead Channel Sensing Intrinsic Amplitude: 12 mV
Lead Channel Setting Pacing Amplitude: 2 V
Lead Channel Setting Pacing Amplitude: 2 V
Lead Channel Setting Pacing Amplitude: 2 V
Lead Channel Setting Pacing Pulse Width: 0.4 ms
Lead Channel Setting Pacing Pulse Width: 0.7 ms
Lead Channel Setting Sensing Sensitivity: 2 mV
Pulse Gen Model: 3562
Pulse Gen Serial Number: 3818460

## 2021-06-24 NOTE — Progress Notes (Signed)
Remote pacemaker transmission.   

## 2021-06-30 ENCOUNTER — Other Ambulatory Visit: Payer: Self-pay | Admitting: Family Medicine

## 2021-09-08 ENCOUNTER — Other Ambulatory Visit: Payer: Self-pay | Admitting: Family Medicine

## 2021-09-09 ENCOUNTER — Ambulatory Visit (INDEPENDENT_AMBULATORY_CARE_PROVIDER_SITE_OTHER): Payer: Medicare HMO

## 2021-09-09 DIAGNOSIS — I428 Other cardiomyopathies: Secondary | ICD-10-CM | POA: Diagnosis not present

## 2021-09-10 NOTE — Telephone Encounter (Signed)
Please let patient know I am refilling this medication, but she needs to schedule an appointment with me.   Thanks, Aailyah Dunbar J Sims Laday, MD  

## 2021-09-11 LAB — CUP PACEART REMOTE DEVICE CHECK
Battery Remaining Longevity: 66 mo
Battery Remaining Percentage: 75 %
Battery Voltage: 2.99 V
Brady Statistic AP VP Percent: 2.7 %
Brady Statistic AP VS Percent: 1 %
Brady Statistic AS VP Percent: 90 %
Brady Statistic AS VS Percent: 2.3 %
Brady Statistic RA Percent Paced: 1 %
Date Time Interrogation Session: 20230612104905
Implantable Lead Implant Date: 20070712
Implantable Lead Implant Date: 20070712
Implantable Lead Implant Date: 20141229
Implantable Lead Location: 753858
Implantable Lead Location: 753859
Implantable Lead Location: 753860
Implantable Lead Model: 7020
Implantable Pulse Generator Implant Date: 20210518
Lead Channel Impedance Value: 280 Ohm
Lead Channel Impedance Value: 400 Ohm
Lead Channel Impedance Value: 410 Ohm
Lead Channel Pacing Threshold Amplitude: 0.625 V
Lead Channel Pacing Threshold Amplitude: 1 V
Lead Channel Pacing Threshold Amplitude: 1 V
Lead Channel Pacing Threshold Pulse Width: 0.4 ms
Lead Channel Pacing Threshold Pulse Width: 0.4 ms
Lead Channel Pacing Threshold Pulse Width: 0.7 ms
Lead Channel Sensing Intrinsic Amplitude: 0.9 mV
Lead Channel Sensing Intrinsic Amplitude: 11.1 mV
Lead Channel Setting Pacing Amplitude: 2 V
Lead Channel Setting Pacing Amplitude: 2 V
Lead Channel Setting Pacing Amplitude: 2 V
Lead Channel Setting Pacing Pulse Width: 0.4 ms
Lead Channel Setting Pacing Pulse Width: 0.7 ms
Lead Channel Setting Sensing Sensitivity: 2 mV
Pulse Gen Model: 3562
Pulse Gen Serial Number: 3818460

## 2021-10-24 ENCOUNTER — Ambulatory Visit (INDEPENDENT_AMBULATORY_CARE_PROVIDER_SITE_OTHER): Payer: Medicare HMO | Admitting: Family Medicine

## 2021-10-24 ENCOUNTER — Encounter: Payer: Self-pay | Admitting: Family Medicine

## 2021-10-24 VITALS — BP 135/86 | HR 93 | Ht 60.0 in | Wt 97.2 lb

## 2021-10-24 DIAGNOSIS — R5382 Chronic fatigue, unspecified: Secondary | ICD-10-CM

## 2021-10-24 DIAGNOSIS — I10 Essential (primary) hypertension: Secondary | ICD-10-CM

## 2021-10-24 DIAGNOSIS — L98429 Non-pressure chronic ulcer of back with unspecified severity: Secondary | ICD-10-CM | POA: Diagnosis not present

## 2021-10-24 DIAGNOSIS — I5022 Chronic systolic (congestive) heart failure: Secondary | ICD-10-CM

## 2021-10-24 NOTE — Assessment & Plan Note (Signed)
Stable. Continue Remeron.

## 2021-10-24 NOTE — Patient Instructions (Signed)
It was great to see you again today!  Try zinc oxide cream (like Boudreaux's Butt Paste) on the area on your bottom Let me know if it's not doing better in a few weeks  Be well, Dr. [name]

## 2021-10-24 NOTE — Progress Notes (Signed)
  Date of Visit: 10/24/2021   HPI:  [name] is a 88 y.o. female who presents to clinic for routine f/u:  Rash Patient reports rash overlying sacrum x1 month. She wears pads for incontinence. She changes pads frequently. Rash is painful and has not improved. She has tried Vaseline and antibiotic ointment on the rash.  CHF No SOB. She reports mild leg swelling that is normal for her. She is taking Carvedilol 6.'25mg'$  twice daily and Torsemide '20mg'$  every other day.   Hypertension No headaches or chest pain. She is taking Carvedilol. She does not check BPs at home.  PHYSICAL EXAM: BP 135/86   Pulse 93   Ht 5' (1.524 m)   Wt 97 lb 3.2 oz (44.1 kg)   SpO2 100%   BMI 18.98 kg/m  Gen: Well appearing female in no distress CV: Normal rate and rhythm. +1 pitting edema bilateral feet and legs to shins. No murmurs or rubs or gallops. Pulm: Normal work of breathing. No wheezing or crackles. Neuro: Alert and oriented. Skin: Erythematous mild skin breakdown overlying sacrum  ASSESSMENT/PLAN:  Stage 1 sacral ulcer Start zinc oxide barrier cream. Keep the area clean and dry. Continue to change pads frequently. Continue to monitor and return if symptoms worsen or do not improve.  HYPERTENSION, BENIGN SYSTEMIC Stable and at goal. Continue Carvedilol. BMP today.  Chronic systolic heart failure (HCC) Stable. Continue Carvedilol and Torsemide. BMP today.  Fatigue Stable. Continue Remeron.  Follow-up Return in one year for routine f/u or sooner if needed.  Leonette Nutting, [facility]  Patient seen along with [employer] student Leonette Nutting. I personally evaluated this patient along with the student, and verified all aspects of the history, physical exam, and medical decision making as documented by the student. I agree with the student's documentation and have made all necessary edits.  Chrisandra Netters, MD  [place]

## 2021-10-24 NOTE — Assessment & Plan Note (Signed)
Stable. Continue Carvedilol and Torsemide. BMP today.

## 2021-10-24 NOTE — Assessment & Plan Note (Addendum)
Stable and at goal. Continue Carvedilol. BMP today.

## 2021-10-25 ENCOUNTER — Encounter: Payer: Self-pay | Admitting: Family Medicine

## 2021-10-25 LAB — BASIC METABOLIC PANEL
BUN/Creatinine Ratio: 17 (ref 12–28)
BUN: 14 mg/dL (ref 8–27)
CO2: 24 mmol/L (ref 20–29)
Calcium: 9.9 mg/dL (ref 8.7–10.3)
Chloride: 101 mmol/L (ref 96–106)
Creatinine, Ser: 0.82 mg/dL (ref 0.57–1.00)
Glucose: 119 mg/dL — ABNORMAL HIGH (ref 70–99)
Potassium: 4.1 mmol/L (ref 3.5–5.2)
Sodium: 143 mmol/L (ref 134–144)
eGFR: 68 mL/min/{1.73_m2} (ref >59)

## 2021-12-09 ENCOUNTER — Other Ambulatory Visit: Payer: Self-pay | Admitting: Family Medicine

## 2021-12-09 ENCOUNTER — Ambulatory Visit (INDEPENDENT_AMBULATORY_CARE_PROVIDER_SITE_OTHER): Payer: Medicare HMO

## 2021-12-09 DIAGNOSIS — I5022 Chronic systolic (congestive) heart failure: Secondary | ICD-10-CM

## 2021-12-12 LAB — CUP PACEART REMOTE DEVICE CHECK
Battery Remaining Longevity: 65 mo
Battery Remaining Percentage: 71 %
Battery Voltage: 2.99 V
Brady Statistic AP VP Percent: 2.9 %
Brady Statistic AP VS Percent: 1 %
Brady Statistic AS VP Percent: 90 %
Brady Statistic AS VS Percent: 2 %
Brady Statistic RA Percent Paced: 1 %
Date Time Interrogation Session: 20230911095206
Implantable Lead Implant Date: 20070712
Implantable Lead Implant Date: 20070712
Implantable Lead Implant Date: 20141229
Implantable Lead Location: 753858
Implantable Lead Location: 753859
Implantable Lead Location: 753860
Implantable Lead Model: 7020
Implantable Pulse Generator Implant Date: 20210518
Lead Channel Impedance Value: 290 Ohm
Lead Channel Impedance Value: 410 Ohm
Lead Channel Impedance Value: 460 Ohm
Lead Channel Pacing Threshold Amplitude: 0.75 V
Lead Channel Pacing Threshold Amplitude: 1 V
Lead Channel Pacing Threshold Amplitude: 1.125 V
Lead Channel Pacing Threshold Pulse Width: 0.4 ms
Lead Channel Pacing Threshold Pulse Width: 0.4 ms
Lead Channel Pacing Threshold Pulse Width: 0.7 ms
Lead Channel Sensing Intrinsic Amplitude: 1.5 mV
Lead Channel Sensing Intrinsic Amplitude: 12 mV
Lead Channel Setting Pacing Amplitude: 2 V
Lead Channel Setting Pacing Amplitude: 2 V
Lead Channel Setting Pacing Amplitude: 2 V
Lead Channel Setting Pacing Pulse Width: 0.4 ms
Lead Channel Setting Pacing Pulse Width: 0.7 ms
Lead Channel Setting Sensing Sensitivity: 2 mV
Pulse Gen Model: 3562
Pulse Gen Serial Number: 3818460

## 2021-12-26 NOTE — Progress Notes (Signed)
Remote pacemaker transmission.   

## 2022-01-13 ENCOUNTER — Other Ambulatory Visit: Payer: Self-pay | Admitting: Family Medicine

## 2022-03-10 ENCOUNTER — Ambulatory Visit (INDEPENDENT_AMBULATORY_CARE_PROVIDER_SITE_OTHER): Payer: Medicare HMO

## 2022-03-10 DIAGNOSIS — I5022 Chronic systolic (congestive) heart failure: Secondary | ICD-10-CM

## 2022-03-10 DIAGNOSIS — I428 Other cardiomyopathies: Secondary | ICD-10-CM

## 2022-03-11 LAB — CUP PACEART REMOTE DEVICE CHECK
Battery Remaining Longevity: 60 mo
Battery Remaining Percentage: 68 %
Battery Voltage: 2.98 V
Brady Statistic AP VP Percent: 2.7 %
Brady Statistic AP VS Percent: 1 %
Brady Statistic AS VP Percent: 90 %
Brady Statistic AS VS Percent: 2 %
Brady Statistic RA Percent Paced: 1 %
Date Time Interrogation Session: 20231212110731
Implantable Lead Connection Status: 753985
Implantable Lead Connection Status: 753985
Implantable Lead Connection Status: 753985
Implantable Lead Implant Date: 20070712
Implantable Lead Implant Date: 20070712
Implantable Lead Implant Date: 20141229
Implantable Lead Location: 753858
Implantable Lead Location: 753859
Implantable Lead Location: 753860
Implantable Lead Model: 7020
Implantable Pulse Generator Implant Date: 20210518
Lead Channel Impedance Value: 280 Ohm
Lead Channel Impedance Value: 380 Ohm
Lead Channel Impedance Value: 400 Ohm
Lead Channel Pacing Threshold Amplitude: 0.75 V
Lead Channel Pacing Threshold Amplitude: 1 V
Lead Channel Pacing Threshold Amplitude: 1.125 V
Lead Channel Pacing Threshold Pulse Width: 0.4 ms
Lead Channel Pacing Threshold Pulse Width: 0.4 ms
Lead Channel Pacing Threshold Pulse Width: 0.7 ms
Lead Channel Sensing Intrinsic Amplitude: 0.9 mV
Lead Channel Sensing Intrinsic Amplitude: 7.8 mV
Lead Channel Setting Pacing Amplitude: 2 V
Lead Channel Setting Pacing Amplitude: 2 V
Lead Channel Setting Pacing Amplitude: 2 V
Lead Channel Setting Pacing Pulse Width: 0.4 ms
Lead Channel Setting Pacing Pulse Width: 0.7 ms
Lead Channel Setting Sensing Sensitivity: 2 mV
Pulse Gen Model: 3562
Pulse Gen Serial Number: 3818460

## 2022-03-18 ENCOUNTER — Other Ambulatory Visit: Payer: Self-pay | Admitting: Family Medicine

## 2022-03-30 ENCOUNTER — Telehealth: Payer: Self-pay | Admitting: Family Medicine

## 2022-03-30 ENCOUNTER — Other Ambulatory Visit: Payer: Self-pay | Admitting: Family Medicine

## 2022-03-30 MED ORDER — NIRMATRELVIR/RITONAVIR (PAXLOVID)TABLET: 3.0000 | ORAL_TABLET | Freq: Two times a day (BID) | ORAL | 0 refills | Status: DC

## 2022-03-30 NOTE — Telephone Encounter (Signed)
Received an after hours page from this patient who's son is current on our service. Tested positive for COVID yesterday. Feels exhausted, her bones hurt. States she is not coughing bad but does have some mucous production. Having some headache. Took an over the counter cold and flu mediation and thinks she feels a little better. Wants to know if she's needs antibiotics which I discussed she would not at this time. Also lets [place] know that she is not able to care for her son at home at this time if he were to discharge. Discussed keeping him here longer while he gets off oxygen and improves. Also discussed the option of SNF which she says the family will think about. Answered all questions and concerns

## 2022-03-30 NOTE — Progress Notes (Signed)
Patient called about testing positive for COVID yesterday. Please see after hours phone note for details. Will prescribe Paxlovid x 5 days

## 2022-03-31 ENCOUNTER — Telehealth: Payer: Self-pay | Admitting: Family Medicine

## 2022-03-31 MED ORDER — NIRMATRELVIR/RITONAVIR (PAXLOVID)TABLET: 3.0000 | ORAL_TABLET | Freq: Two times a day (BID) | ORAL | 0 refills | Status: DC

## 2022-03-31 MED ORDER — NIRMATRELVIR/RITONAVIR (PAXLOVID)TABLET: 3.0000 | ORAL_TABLET | Freq: Two times a day (BID) | ORAL | 0 refills | Status: AC

## 2022-03-31 NOTE — Telephone Encounter (Signed)
Called and spoke with patient Pharmacy  never received paxlovid - it was inadvertently printed rather than e prescribed yesterday Sent in new rx. Symptoms began 4 days ago on Thursday. Confirmed no major drug interactions - only potential rxn is with mirtazapine, counseled that she may be more drowsy.  Patient appreciative   [name], MD

## 2022-04-07 ENCOUNTER — Other Ambulatory Visit: Payer: Self-pay | Admitting: Family Medicine

## 2022-04-18 ENCOUNTER — Telehealth: Payer: Self-pay

## 2022-04-18 ENCOUNTER — Emergency Department (HOSPITAL_COMMUNITY): Payer: Medicare HMO

## 2022-04-18 ENCOUNTER — Other Ambulatory Visit: Payer: Self-pay

## 2022-04-18 ENCOUNTER — Inpatient Hospital Stay (HOSPITAL_COMMUNITY): Payer: Medicare HMO

## 2022-04-18 ENCOUNTER — Inpatient Hospital Stay (HOSPITAL_COMMUNITY)
Admission: EM | Admit: 2022-04-18 | Discharge: 2022-04-21 | DRG: 291 | Disposition: A | Discharge status: Home / Self Care | Payer: Medicare HMO | Attending: Internal Medicine | Admitting: Internal Medicine

## 2022-04-18 ENCOUNTER — Encounter (HOSPITAL_COMMUNITY): Payer: Self-pay

## 2022-04-18 DIAGNOSIS — L8990 Pressure ulcer of unspecified site, unspecified stage: Secondary | ICD-10-CM | POA: Diagnosis present

## 2022-04-18 DIAGNOSIS — E876 Hypokalemia: Secondary | ICD-10-CM | POA: Diagnosis not present

## 2022-04-18 DIAGNOSIS — Z681 Body mass index (BMI) 19 or less, adult: Secondary | ICD-10-CM

## 2022-04-18 DIAGNOSIS — R1111 Vomiting without nausea: Secondary | ICD-10-CM | POA: Diagnosis not present

## 2022-04-18 DIAGNOSIS — N281 Cyst of kidney, acquired: Secondary | ICD-10-CM | POA: Diagnosis not present

## 2022-04-18 DIAGNOSIS — I5023 Acute on chronic systolic (congestive) heart failure: Secondary | ICD-10-CM | POA: Diagnosis not present

## 2022-04-18 DIAGNOSIS — R636 Underweight: Secondary | ICD-10-CM | POA: Diagnosis not present

## 2022-04-18 DIAGNOSIS — I872 Venous insufficiency (chronic) (peripheral): Secondary | ICD-10-CM | POA: Diagnosis not present

## 2022-04-18 DIAGNOSIS — Z87891 Personal history of nicotine dependence: Secondary | ICD-10-CM

## 2022-04-18 DIAGNOSIS — Z66 Do not resuscitate: Secondary | ICD-10-CM | POA: Diagnosis present

## 2022-04-18 DIAGNOSIS — N9489 Other specified conditions associated with female genital organs and menstrual cycle: Secondary | ICD-10-CM

## 2022-04-18 DIAGNOSIS — Z1152 Encounter for screening for COVID-19: Secondary | ICD-10-CM | POA: Diagnosis not present

## 2022-04-18 DIAGNOSIS — E785 Hyperlipidemia, unspecified: Secondary | ICD-10-CM | POA: Diagnosis not present

## 2022-04-18 DIAGNOSIS — Z743 Need for continuous supervision: Secondary | ICD-10-CM | POA: Diagnosis not present

## 2022-04-18 DIAGNOSIS — I480 Paroxysmal atrial fibrillation: Secondary | ICD-10-CM | POA: Diagnosis present

## 2022-04-18 DIAGNOSIS — I472 Ventricular tachycardia, unspecified: Secondary | ICD-10-CM | POA: Diagnosis not present

## 2022-04-18 DIAGNOSIS — J439 Emphysema, unspecified: Secondary | ICD-10-CM | POA: Diagnosis not present

## 2022-04-18 DIAGNOSIS — I428 Other cardiomyopathies: Secondary | ICD-10-CM | POA: Diagnosis present

## 2022-04-18 DIAGNOSIS — R1110 Vomiting, unspecified: Secondary | ICD-10-CM | POA: Diagnosis not present

## 2022-04-18 DIAGNOSIS — L89152 Pressure ulcer of sacral region, stage 2: Secondary | ICD-10-CM | POA: Diagnosis not present

## 2022-04-18 DIAGNOSIS — Z950 Presence of cardiac pacemaker: Secondary | ICD-10-CM | POA: Diagnosis not present

## 2022-04-18 DIAGNOSIS — G629 Polyneuropathy, unspecified: Secondary | ICD-10-CM | POA: Diagnosis not present

## 2022-04-18 DIAGNOSIS — Z810 Family history of intellectual disabilities: Secondary | ICD-10-CM

## 2022-04-18 DIAGNOSIS — Z95810 Presence of automatic (implantable) cardiac defibrillator: Secondary | ICD-10-CM | POA: Diagnosis present

## 2022-04-18 DIAGNOSIS — J90 Pleural effusion, not elsewhere classified: Secondary | ICD-10-CM | POA: Diagnosis not present

## 2022-04-18 DIAGNOSIS — Z96643 Presence of artificial hip joint, bilateral: Secondary | ICD-10-CM | POA: Diagnosis present

## 2022-04-18 DIAGNOSIS — I110 Hypertensive heart disease with heart failure: Principal | ICD-10-CM | POA: Diagnosis present

## 2022-04-18 DIAGNOSIS — R0600 Dyspnea, unspecified: Secondary | ICD-10-CM | POA: Diagnosis not present

## 2022-04-18 DIAGNOSIS — Z8249 Family history of ischemic heart disease and other diseases of the circulatory system: Secondary | ICD-10-CM | POA: Diagnosis not present

## 2022-04-18 DIAGNOSIS — I509 Heart failure, unspecified: Principal | ICD-10-CM

## 2022-04-18 DIAGNOSIS — Z833 Family history of diabetes mellitus: Secondary | ICD-10-CM

## 2022-04-18 DIAGNOSIS — D7589 Other specified diseases of blood and blood-forming organs: Secondary | ICD-10-CM | POA: Diagnosis not present

## 2022-04-18 DIAGNOSIS — I723 Aneurysm of iliac artery: Secondary | ICD-10-CM | POA: Diagnosis not present

## 2022-04-18 DIAGNOSIS — K8020 Calculus of gallbladder without cholecystitis without obstruction: Secondary | ICD-10-CM | POA: Diagnosis present

## 2022-04-18 DIAGNOSIS — I5043 Acute on chronic combined systolic (congestive) and diastolic (congestive) heart failure: Secondary | ICD-10-CM | POA: Diagnosis not present

## 2022-04-18 DIAGNOSIS — I7300 Raynaud's syndrome without gangrene: Secondary | ICD-10-CM | POA: Diagnosis present

## 2022-04-18 DIAGNOSIS — Z882 Allergy status to sulfonamides status: Secondary | ICD-10-CM | POA: Diagnosis not present

## 2022-04-18 DIAGNOSIS — R110 Nausea: Secondary | ICD-10-CM | POA: Diagnosis not present

## 2022-04-18 DIAGNOSIS — I10 Essential (primary) hypertension: Secondary | ICD-10-CM | POA: Diagnosis present

## 2022-04-18 DIAGNOSIS — I701 Atherosclerosis of renal artery: Secondary | ICD-10-CM | POA: Diagnosis not present

## 2022-04-18 DIAGNOSIS — S0990XA Unspecified injury of head, initial encounter: Secondary | ICD-10-CM | POA: Diagnosis not present

## 2022-04-18 DIAGNOSIS — Z8261 Family history of arthritis: Secondary | ICD-10-CM

## 2022-04-18 DIAGNOSIS — Z8616 Personal history of COVID-19: Secondary | ICD-10-CM | POA: Diagnosis not present

## 2022-04-18 DIAGNOSIS — R112 Nausea with vomiting, unspecified: Secondary | ICD-10-CM | POA: Diagnosis not present

## 2022-04-18 LAB — CBC
HCT: 45.7 % (ref 36.0–46.0)
HCT: 43.5 % (ref 36.0–46.0)
Hemoglobin: 15.1 g/dL — ABNORMAL HIGH (ref 12.0–15.0)
Hemoglobin: 14.7 g/dL (ref 12.0–15.0)
MCH: 34.5 pg — ABNORMAL HIGH (ref 26.0–34.0)
MCH: 34.9 pg — ABNORMAL HIGH (ref 26.0–34.0)
MCHC: 33 g/dL (ref 30.0–36.0)
MCHC: 33.8 g/dL (ref 30.0–36.0)
MCV: 104.3 fL — ABNORMAL HIGH (ref 80.0–100.0)
MCV: 103.3 fL — ABNORMAL HIGH (ref 80.0–100.0)
Platelets: 108 10*3/uL — ABNORMAL LOW (ref 150–400)
Platelets: 106 10*3/uL — ABNORMAL LOW (ref 150–400)
RBC: 4.38 MIL/uL (ref 3.87–5.11)
RBC: 4.21 MIL/uL (ref 3.87–5.11)
RDW: 15.5 % (ref 11.5–15.5)
RDW: 15.6 % — ABNORMAL HIGH (ref 11.5–15.5)
WBC: 11.3 10*3/uL — ABNORMAL HIGH (ref 4.0–10.5)
WBC: 10 10*3/uL (ref 4.0–10.5)
nRBC: 0 % (ref 0.0–0.2)
nRBC: 0 % (ref 0.0–0.2)

## 2022-04-18 LAB — COMPREHENSIVE METABOLIC PANEL
ALT: 17 U/L (ref 0–44)
AST: 28 U/L (ref 15–41)
Albumin: 3.7 g/dL (ref 3.5–5.0)
Alkaline Phosphatase: 61 U/L (ref 38–126)
Anion gap: 9 (ref 5–15)
BUN: 10 mg/dL (ref 8–23)
CO2: 29 mmol/L (ref 22–32)
Calcium: 8.8 mg/dL — ABNORMAL LOW (ref 8.9–10.3)
Chloride: 98 mmol/L (ref 98–111)
Creatinine, Ser: 0.76 mg/dL (ref 0.44–1.00)
GFR, Estimated: >60 mL/min (ref >60)
Glucose, Bld: 155 mg/dL — ABNORMAL HIGH (ref 70–99)
Potassium: 3 mmol/L — ABNORMAL LOW (ref 3.5–5.1)
Sodium: 136 mmol/L (ref 135–145)
Total Bilirubin: 1.5 mg/dL — ABNORMAL HIGH (ref 0.3–1.2)
Total Protein: 6.8 g/dL (ref 6.5–8.1)

## 2022-04-18 LAB — ECHOCARDIOGRAM COMPLETE
Area-P 1/2: 3.81 cm2
MV M vel: 5.16 m/s
MV Peak grad: 106.5 mmHg
MV VTI: 0.79 cm2
P 1/2 time: 279 msec
S' Lateral: 4.6 cm

## 2022-04-18 LAB — TROPONIN I (HIGH SENSITIVITY)
Troponin I (High Sensitivity): 15 ng/L (ref <18)
Troponin I (High Sensitivity): 14 ng/L (ref <18)
Troponin I (High Sensitivity): 13 ng/L (ref <18)
Troponin I (High Sensitivity): 16 ng/L (ref <18)

## 2022-04-18 LAB — MAGNESIUM: Magnesium: 1.6 mg/dL — ABNORMAL LOW (ref 1.7–2.4)

## 2022-04-18 LAB — I-STAT CHEM 8, ED
BUN: 8 mg/dL (ref 8–23)
Calcium, Ion: 1.13 mmol/L — ABNORMAL LOW (ref 1.15–1.40)
Chloride: 95 mmol/L — ABNORMAL LOW (ref 98–111)
Creatinine, Ser: 0.7 mg/dL (ref 0.44–1.00)
Glucose, Bld: 158 mg/dL — ABNORMAL HIGH (ref 70–99)
HCT: 49 % — ABNORMAL HIGH (ref 36.0–46.0)
Hemoglobin: 16.7 g/dL — ABNORMAL HIGH (ref 12.0–15.0)
Potassium: 3 mmol/L — ABNORMAL LOW (ref 3.5–5.1)
Sodium: 139 mmol/L (ref 135–145)
TCO2: 33 mmol/L — ABNORMAL HIGH (ref 22–32)

## 2022-04-18 LAB — BRAIN NATRIURETIC PEPTIDE: B Natriuretic Peptide: 3630.7 pg/mL — ABNORMAL HIGH (ref 0.0–100.0)

## 2022-04-18 LAB — CREATININE, SERUM
Creatinine, Ser: 0.75 mg/dL (ref 0.44–1.00)
GFR, Estimated: >60 mL/min (ref >60)

## 2022-04-18 LAB — RESP PANEL BY RT-PCR (RSV, FLU A&B, COVID)  RVPGX2
Influenza A by PCR: NEGATIVE
Influenza B by PCR: NEGATIVE
Resp Syncytial Virus by PCR: NEGATIVE
SARS Coronavirus 2 by RT PCR: NEGATIVE

## 2022-04-18 LAB — LIPASE, BLOOD: Lipase: 25 U/L (ref 11–51)

## 2022-04-18 MED ORDER — CARVEDILOL 6.25 MG PO TABS
6.2500 mg | ORAL_TABLET | Freq: Two times a day (BID) | ORAL | Status: DC
  Administered 2022-04-18 – 2022-04-19 (×2): 6.25 mg via ORAL
  Filled 2022-04-18 (×2): qty 1

## 2022-04-18 MED ORDER — HYDRALAZINE HCL 20 MG/ML IJ SOLN: 10.0000 mg | INTRAMUSCULAR | Status: DC | PRN

## 2022-04-18 MED ORDER — MAGNESIUM OXIDE -MG SUPPLEMENT 400 (240 MG) MG PO TABS
800.0000 mg | ORAL_TABLET | ORAL | Status: AC
  Administered 2022-04-18 (×2): 800 mg via ORAL
  Filled 2022-04-18 (×2): qty 2

## 2022-04-18 MED ORDER — IOHEXOL 350 MG/ML SOLN
90.0000 mL | Freq: Once | INTRAVENOUS | Status: AC | PRN
  Administered 2022-04-18: 90 mL via INTRAVENOUS

## 2022-04-18 MED ORDER — ONDANSETRON HCL 4 MG/2ML IJ SOLN: 4.0000 mg | Freq: Four times a day (QID) | INTRAMUSCULAR | Status: DC | PRN

## 2022-04-18 MED ORDER — MIRTAZAPINE 15 MG PO TABS
15.0000 mg | ORAL_TABLET | Freq: Every day | ORAL | Status: DC
  Administered 2022-04-18 – 2022-04-20 (×3): 15 mg via ORAL
  Filled 2022-04-18 (×3): qty 1

## 2022-04-18 MED ORDER — CALCIUM GLUCONATE-NACL 2-0.675 GM/100ML-% IV SOLN
2.0000 g | Freq: Once | INTRAVENOUS | Status: AC
  Administered 2022-04-18: 2000 mg via INTRAVENOUS
  Filled 2022-04-18 (×2): qty 100

## 2022-04-18 MED ORDER — TRAZODONE HCL 50 MG PO TABS
50.0000 mg | ORAL_TABLET | Freq: Every evening | ORAL | Status: DC | PRN
  Administered 2022-04-19 – 2022-04-20 (×2): 50 mg via ORAL
  Filled 2022-04-18 (×2): qty 1

## 2022-04-18 MED ORDER — METOPROLOL TARTRATE 5 MG/5ML IV SOLN: 5.0000 mg | INTRAVENOUS | Status: DC | PRN

## 2022-04-18 MED ORDER — HEPARIN SODIUM (PORCINE) 5000 UNIT/ML IJ SOLN
5000.0000 [IU] | Freq: Three times a day (TID) | INTRAMUSCULAR | Status: DC
  Administered 2022-04-18 – 2022-04-20 (×5): 5000 [IU] via SUBCUTANEOUS
  Filled 2022-04-18 (×5): qty 1

## 2022-04-18 MED ORDER — SENNOSIDES-DOCUSATE SODIUM 8.6-50 MG PO TABS
1.0000 | ORAL_TABLET | Freq: Every evening | ORAL | Status: DC | PRN
  Administered 2022-04-19: 1 via ORAL
  Filled 2022-04-18: qty 1

## 2022-04-18 MED ORDER — ACETAMINOPHEN 325 MG PO TABS
650.0000 mg | ORAL_TABLET | Freq: Four times a day (QID) | ORAL | Status: DC | PRN
  Administered 2022-04-21: 650 mg via ORAL
  Filled 2022-04-18: qty 2

## 2022-04-18 MED ORDER — FUROSEMIDE 10 MG/ML IJ SOLN
40.0000 mg | Freq: Two times a day (BID) | INTRAMUSCULAR | Status: DC
  Administered 2022-04-18 – 2022-04-19 (×3): 40 mg via INTRAVENOUS
  Filled 2022-04-18 (×3): qty 4

## 2022-04-18 MED ORDER — GUAIFENESIN 100 MG/5ML PO LIQD: 5.0000 mL | ORAL | Status: DC | PRN

## 2022-04-18 MED ORDER — FUROSEMIDE 10 MG/ML IJ SOLN
40.0000 mg | Freq: Once | INTRAMUSCULAR | Status: AC
  Administered 2022-04-18: 40 mg via INTRAVENOUS
  Filled 2022-04-18: qty 4

## 2022-04-18 MED ORDER — IPRATROPIUM-ALBUTEROL 0.5-2.5 (3) MG/3ML IN SOLN: 3.0000 mL | RESPIRATORY_TRACT | Status: DC | PRN

## 2022-04-18 MED ORDER — POTASSIUM CHLORIDE CRYS ER 20 MEQ PO TBCR
40.0000 meq | EXTENDED_RELEASE_TABLET | ORAL | Status: AC
  Administered 2022-04-18 (×3): 40 meq via ORAL
  Filled 2022-04-18 (×3): qty 2

## 2022-04-18 NOTE — Progress Notes (Signed)
  Echocardiogram 2D Echocardiogram has been performed.  [name] 04/18/2022, 4:29 PM

## 2022-04-18 NOTE — ED Notes (Signed)
Pt states she fell when trying to ambulate this morning and doesn't feel that she can now.

## 2022-04-18 NOTE — H&P (Signed)
History and Physical    [name] WCB:[id number] DOB: [birth year] DOA: 04/18/2022  PCP: [name], MD Patient coming from: Home  Chief Complaint: SOB/ Nausea/vomiting.   HPI: [name] is a 89 y.o. female with medical history significant of with PMHx of Systolic CHF with Bvicd, P A fib, HTN with pmhx of Shortness of breath, nausea, vomiting.  Patient states she had COVID-19 pneumonia back in March 28, 2022 which she recovered from but over the course of past 2-3 days she has had some feeling of nausea, orthopnea and at times chest discomfort.  The symptoms were slightly more pronounced last night along with increasing bilateral lower extremity swelling and did not get much rest.  This morning her symptoms persisted therefore called EMS and she was brought to the hospital.  She denies any fevers, chills or other complaints. She also reports of left lower extremity mild erythema and slight pain.   ED Course. Lab work showed hypoK, HypoCa. CTA didn't show PE but showed pleural effusion. No other acute pathology. CTH is neg. She is admitted for CHF exac, and started on Lasix '40mg'$  IV   Review of Systems: As per HPI otherwise 10 point review of systems negative.  Review of Systems Otherwise negative except as per HPI, including: General: Denies fever, chills, night sweats or unintended weight loss. Resp: Denies  wheezing Cardiac: Denies orthopnea, paroxysmal nocturnal dyspnea. GI: Denies  diarrhea or constipation GU: Denies dysuria, frequency, hesitancy or incontinence MS: Denies muscle aches, joint pain or swelling Neuro: Denies headache, neurologic deficits (focal weakness, numbness, tingling), abnormal gait Psych: Denies anxiety, depression, SI/HI/AVH Skin: Slight left lower extremity erythema ID: Denies sick contacts, exotic exposures, travel  Past Medical History:  Diagnosis Date   Biventricular ICD (implantable cardioverter-defibrillator)-[employer]. Jude    Date of  implant 2007 generator change 2012   Hyperlipidemia    Hypertension    Macro dislodgment of the left ventricular lead 09/09/2012   Nonischemic cardiomyopathy ([place])    Cath without coronary disease 2006   Nonischemic cardiomyopathy ([place])    Osteoporosis    PAF (paroxysmal atrial fibrillation) (HCC)    Short bursts of PAF. Asymptomatic.  Sees Dr Rex Kras.   Peripheral neuropathy, hereditary/idiopathic 2012   Raynaud phenomenon    Sinus tachycardia    Systolic CHF [facility])    Urge incontinence     Past Surgical History:  Procedure Laterality Date   ABDOMINAL HYSTERECTOMY  1970s   APPENDECTOMY     BI-VENTRICULAR PACEMAKER INSERTION N/A 03/28/2013   Procedure: BI-VENTRICULAR PACEMAKER INSERTION (CRT-P);  Surgeon: Deboraha Sprang, MD;  Location: [facility] CATH LAB;  Service: Cardiovascular;  Laterality: N/A;   Biventricular ICD Implant  06/28/2010; 03/28/2013   STJ CRTD implanted 2012 with dislodgement of LV lead 2014; 03/28/2013 CRTP placed with new LV lead by Dr Caryl Comes   Bladder tack     CARDIAC CATHETERIZATION  03/2005   EF 25%, Normal arteries, Nonischemic CM   HIP ARTHROPLASTY Left 12/20/2013   Procedure: LEFT HIP HEMI ARTHROPLASTY;  Surgeon: Meredith Pel, MD;  Location: [place];  Service: Orthopedics;  Laterality: Left;   LEFT AND RIGHT HEART CATHETERIZATION WITH CORONARY ANGIOGRAM N/A 12/13/2012   Procedure: LEFT AND RIGHT HEART CATHETERIZATION WITH CORONARY ANGIOGRAM;  Surgeon: Blane Ohara, MD;  Location: [facility] CATH LAB;  Service: Cardiovascular;  Laterality: N/A;   NM MYOCAR PERF WALL MOTION  03/19/2010   EF 63%, low risk   PPM GENERATOR CHANGEOUT N/A 08/16/2019   Procedure:  PPM GENERATOR CHANGEOUT;  Surgeon: Deboraha Sprang, MD;  Location: [place] CV LAB;  Service: Cardiovascular;  Laterality: N/A;   Suprapubic Urethrovesicular Suspension  1992   TOTAL HIP ARTHROPLASTY Right 09/04/2014   Procedure: BIPOLAR HIP ARTHROPLASTY ANTERIOR APPROACH;  Surgeon: Mcarthur Rossetti, MD;   Location: [place];  Service: Orthopedics;  Laterality: Right;   US ECHOCARDIOGRAPHY  11/18/2011   EF 40%, LA moderately dilated, mild AI    SOCIAL HISTORY:  reports that she quit smoking about 20 years ago. Her smoking use included cigarettes. She has a 15.00 pack-year smoking history. She has never used smokeless tobacco. She reports that she does not drink alcohol and does not use drugs.  Allergies  Allergen Reactions   Sulfamethoxazole Anaphylaxis, Shortness Of Breath and Swelling    FAMILY HISTORY: Family History  Problem Relation Age of Onset   Heart disease Mother    Heart disease Father    Cancer Father        Kidney   Arthritis Sister    Heart disease Brother    Diabetes Son    Mental retardation Son    Alcohol abuse Sister    Cancer Brother    Cancer Daughter    Heart disease Daughter      Prior to Admission medications   Medication Sig Start Date End Date Taking? Authorizing Provider  acetaminophen (TYLENOL) 650 MG CR tablet Take 1,300-1,950 mg by mouth in the morning and at bedtime. Take 1950 mg by mouth in the morning and 1300 mg by mouth at night   Yes [provider]  carvedilol (COREG) 6.25 MG tablet TAKE 1 TABLET BY MOUTH TWICE DAILY WITH FOOD Patient taking differently: Take 6.25 mg by mouth 2 (two) times daily with a meal. 01/13/22  Yes Hensel, Jamal Collin, MD  Cranberry 250 MG TABS Take 500 mg by mouth as needed (uti).   Yes [provider]  mirtazapine (REMERON) 15 MG tablet TAKE 1 TABLET(15 MG) BY MOUTH AT BEDTIME Patient taking differently: Take 15 mg by mouth at bedtime. 03/18/22  Yes [name], MD  torsemide (DEMADEX) 20 MG tablet TAKE 1 TABLET BY MOUTH EVERY OTHER DAY 04/08/22  Yes [name], MD    Physical Exam: Vitals:   04/18/22 1030 04/18/22 1130 04/18/22 1227 04/18/22 1230  BP: 115/66 106/71  97/71  Pulse: 85 87  (!) 101  Resp: 11 17  (!) 23  Temp:   97.7 F (36.5 C)   TempSrc:   Oral   SpO2: 99% 97%   100%      Constitutional: NAD, calm, comfortable Eyes: PERRL, lids and conjunctivae normal ENMT: Mucous membranes are moist. Posterior pharynx clear of any exudate or lesions.Normal dentition.  Neck: normal, supple, no masses, no thyromegaly Respiratory: b/l Rhonchi at the bases.  Cardiovascular: Regular rate and rhythm, no murmurs / rubs / gallops. 2+ b/l LE extremity edema. 2+ pedal pulses. No carotid bruits.  Abdomen: no tenderness, no masses palpated. No hepatosplenomegaly. Bowel sounds positive.  Musculoskeletal: no clubbing / cyanosis. No joint deformity upper and lower extremities. Good ROM, no contractures. Normal muscle tone.  Skin: Slight left lower extremity erythema and slight tenderness to palpation. Neurologic: CN 2-12 grossly intact. Sensation intact, DTR normal. Strength 5/5 in all 4.  Psychiatric: Normal judgment and insight. Alert and oriented x 3. Normal mood.     Labs on Admission: I have personally reviewed following labs and imaging studies  CBC: Recent Labs  Lab 04/18/22  1025 04/18/22 1031  WBC 11.3*  --   HGB 15.1* 16.7*  HCT 45.7 49.0*  MCV 104.3*  --   PLT 108*  --    Basic Metabolic Panel: Recent Labs  Lab 04/18/22 1025 04/18/22 1031  NA 136 139  K 3.0* 3.0*  CL 98 95*  CO2 29  --   GLUCOSE 155* 158*  BUN 10 8  CREATININE 0.76 0.70  CALCIUM 8.8*  --    GFR: CrCl cannot be calculated (Unknown ideal weight.). Liver Function Tests: Recent Labs  Lab 04/18/22 1025  AST 28  ALT 17  ALKPHOS 61  BILITOT 1.5*  PROT 6.8  ALBUMIN 3.7   Recent Labs  Lab 04/18/22 1025  LIPASE 25   No results for input(s): "AMMONIA" in the last 168 hours. Coagulation Profile: No results for input(s): "INR", "PROTIME" in the last 168 hours. Cardiac Enzymes: No results for input(s): "CKTOTAL", "CKMB", "CKMBINDEX", "TROPONINI" in the last 168 hours. BNP (last 3 results) No results for input(s): "PROBNP" in the last 8760 hours. HbA1C: No results for  input(s): "HGBA1C" in the last 72 hours. CBG: No results for input(s): "GLUCAP" in the last 168 hours. Lipid Profile: No results for input(s): "CHOL", "HDL", "LDLCALC", "TRIG", "CHOLHDL", "LDLDIRECT" in the last 72 hours. Thyroid Function Tests: No results for input(s): "TSH", "T4TOTAL", "FREET4", "T3FREE", "THYROIDAB" in the last 72 hours. Anemia Panel: No results for input(s): "VITAMINB12", "FOLATE", "FERRITIN", "TIBC", "IRON", "RETICCTPCT" in the last 72 hours. Urine analysis:    Component Value Date/Time   COLORURINE YELLOW 11/11/2018 1330   APPEARANCEUR CLEAR 11/11/2018 1330   LABSPEC 1.008 11/11/2018 1330   PHURINE 5.0 11/11/2018 1330   GLUCOSEU NEGATIVE 11/11/2018 1330   HGBUR SMALL (A) 11/11/2018 1330   HGBUR negative 01/26/2008 1047   BILIRUBINUR negative 03/03/2019 1000   BILIRUBINUR NEG 02/20/2015 1331   KETONESUR negative 03/03/2019 1000   KETONESUR NEGATIVE 11/11/2018 1330   PROTEINUR negative 03/03/2019 1000   PROTEINUR NEGATIVE 11/11/2018 1330   UROBILINOGEN 0.2 03/03/2019 1000   UROBILINOGEN 0.2 12/19/2013 1759   NITRITE Positive (A) 03/03/2019 1000   NITRITE POSITIVE (A) 11/11/2018 1330   LEUKOCYTESUR Trace (A) 03/03/2019 1000   LEUKOCYTESUR MODERATE (A) 11/11/2018 1330   Sepsis Labs: !!!!!!!!!!!!!!!!!!!!!!!!!!!!!!!!!!!!!!!!!!!! '@LABRCNTIP'$ (procalcitonin:4,lacticidven:4) ) Recent Results (from the past 240 hour(s))  Resp panel by RT-PCR (RSV, Flu A&B, Covid) Anterior Nasal Swab     Status: None   Collection Time: 04/18/22 10:25 AM   Specimen: Anterior Nasal Swab  Result Value Ref Range Status   SARS Coronavirus 2 by RT PCR NEGATIVE NEGATIVE Final    Comment: (NOTE) SARS-CoV-2 target nucleic acids are NOT DETECTED.  The SARS-CoV-2 RNA is generally detectable in upper respiratory specimens during the acute phase of infection. The lowest concentration of SARS-CoV-2 viral copies this assay can detect is 138 copies/mL. A negative result does not preclude  SARS-Cov-2 infection and should not be used as the sole basis for treatment or other patient management decisions. A negative result may occur with  improper specimen collection/handling, submission of specimen other than nasopharyngeal swab, presence of viral mutation(s) within the areas targeted by this assay, and inadequate number of viral copies(<138 copies/mL). A negative result must be combined with clinical observations, patient history, and epidemiological information. The expected result is Negative.  Fact Sheet for Patients:  EntrepreneurPulse.com.au  Fact Sheet for Healthcare Providers:  IncredibleEmployment.be  This test is no t yet approved or cleared by the [place] and  has been authorized  for detection and/or diagnosis of SARS-CoV-2 by FDA under an Emergency Use Authorization (EUA). This EUA will remain  in effect (meaning this test can be used) for the duration of the COVID-19 declaration under Section 564(b)(1) of the Act, 21 U.S.C.section 360bbb-3(b)(1), unless the authorization is terminated  or revoked sooner.       Influenza A by PCR NEGATIVE NEGATIVE Final   Influenza B by PCR NEGATIVE NEGATIVE Final    Comment: (NOTE) The Xpert Xpress SARS-CoV-2/FLU/RSV plus assay is intended as an aid in the diagnosis of influenza from Nasopharyngeal swab specimens and should not be used as a sole basis for treatment. Nasal washings and aspirates are unacceptable for Xpert Xpress SARS-CoV-2/FLU/RSV testing.  Fact Sheet for Patients: EntrepreneurPulse.com.au  Fact Sheet for Healthcare Providers: IncredibleEmployment.be  This test is not yet approved or cleared by the [place] FDA and has been authorized for detection and/or diagnosis of SARS-CoV-2 by FDA under an Emergency Use Authorization (EUA). This EUA will remain in effect (meaning this test can be used) for the duration of  the COVID-19 declaration under Section 564(b)(1) of the Act, 21 U.S.C. section 360bbb-3(b)(1), unless the authorization is terminated or revoked.     Resp Syncytial Virus by PCR NEGATIVE NEGATIVE Final    Comment: (NOTE) Fact Sheet for Patients: EntrepreneurPulse.com.au  Fact Sheet for Healthcare Providers: IncredibleEmployment.be  This test is not yet approved or cleared by the [place] FDA and has been authorized for detection and/or diagnosis of SARS-CoV-2 by FDA under an Emergency Use Authorization (EUA). This EUA will remain in effect (meaning this test can be used) for the duration of the COVID-19 declaration under Section 564(b)(1) of the Act, 21 U.S.C. section 360bbb-3(b)(1), unless the authorization is terminated or revoked.  Performed at [facility], [place] [address] [place],  [address]      Radiological Exams on Admission: CT Angio Chest/Abd/Pel for Dissection W and/or Wo Contrast  Result Date: 04/18/2022 CLINICAL DATA:  Aortic acute syndrome. Pain. Nausea since 7 p.m. last night. Abdominal discomfort EXAM: CT ANGIOGRAPHY CHEST, ABDOMEN AND PELVIS TECHNIQUE: Non-contrast CT of the chest was initially obtained. Multidetector CT imaging through the chest, abdomen and pelvis was performed using the standard protocol during bolus administration of intravenous contrast. Multiplanar reconstructed images and MIPs were obtained and reviewed to evaluate the vascular anatomy. RADIATION DOSE REDUCTION: This exam was performed according to the departmental dose-optimization program which includes automated exposure control, adjustment of the mA and/or kV according to patient size and/or use of iterative reconstruction technique. CONTRAST:  51m OMNIPAQUE IOHEXOL 350 MG/ML SOLN COMPARISON:  CT 2010 FINDINGS: CTA CHEST FINDINGS Cardiovascular: The heart is enlarged. Particularly involving the atria. There is a left chest  battery pack identified with defibrillator lead extending along the heart. No significant pericardial effusion. The battery pack and motion does cause extensive artifacts as well as the arms being scanned at the patient's side. The thoracic aorta has scattered atherosclerotic calcified plaque. No dissection clearly seen but there is a large number of artifacts across the thorax. A subtle abnormalities difficult to completely exclude. Diameter of the ascending aorta at the level of the right pulmonary artery measures 3.3 by 3.2 cm. The descending thoracic aorta 2.2 x 2.4 cm. The aortic arch has a diameter approaching 2.7 cm. A bovine type aortic arch is seen with scattered calcified plaque. There is significant artifact obscuring the left subclavian artery. Patency is difficult to determine its origin. Coronary artery calcifications are seen. Of note  on the noncontrast dataset no definite mural hematoma along the aorta. Mediastinum/Nodes: No specific abnormal lymph node enlargement identified in the axillary region, hilum or mediastinum. Normal caliber thoracic esophagus. Lungs/Pleura: Advanced emphysematous lung changes. Apical pleural thickening identified. Slightly nodular opacity identified in the middle lobe measuring 10 by 7 mm on series 4, image 107. No pneumothorax. Tiny bilateral dependent pleural effusions. Musculoskeletal: Diffuse degenerative changes along the spine with some kyphosis. Osteopenia. Review of the MIP images confirms the above findings. CTA ABDOMEN AND PELVIS FINDINGS VASCULAR Aorta: Normal caliber abdominal aorta with diffuse moderate calcified atherosclerotic plaque. No dissection or aneurysm formation. Celiac: There is significant calcified plaque at the origin of the celiac with a stenosis of close to 50%. SMA: Mild plaque at the origin and proximal portion vessel without significant stenosis. Renals: There is some mild plaque noted along the proximal aspects of both renal arteries. No  significant stenosis on the left. Mild on the right, less than 50%. IMA: Grossly patent. Inflow: There is ectasia of the left common iliac artery measuring up to 11 mm. Scattered partially calcified plaque along the iliac vessels diffusely bilaterally. There is some noncalcified plaque as well along the external iliac vessels with some mild stenosis. Veins: Grossly caliber IVC.  Contrast is in the arterial phase. Review of the MIP images confirms the above findings. NON-VASCULAR Hepatobiliary: With the limits of the arterial phase of the contrast bolus, grossly the liver is unremarkable. No obvious mass lesion. There is some stone seen in the dependent portion of the nondilated gallbladder. Pancreas: Moderate atrophy Spleen: . nonenlarged. Adrenals/Urinary Tract: Adrenal glands are preserved. Mild bilateral renal atrophy. Small lower pole right-sided renal cyst. Maximum diameter of 2.3 cm. Hounsfield unit of 12. Similar upper pole left-sided focus with Hounsfield units of 4 and diameter of 17 mm. Bosniak 1 lesions. No specific imaging follow-up recommended. No collecting system dilatation. Preserved contours of the urinary bladder. Stomach/Bowel: On this non oral contrast exam, the large bowel has a normal course and caliber with scattered colonic stool. There is some high density material noted along loops of colon and distal ileum, nonspecific. Please correlate for ingested material. Stomach and small bowel are nondilated. Lymphatic: No specific abnormal lymph node enlargement identified in the abdomen and pelvis. Reproductive: Uterus is not seen. There is a cystic lesion in the left adnexa towards the pelvic sidewall measuring 2.2 by 1.5 cm. This could be ovarian. Hounsfield unit of 8. This was not seen in 1,010. Other: Anasarca identified. No significant ascites. Focal lipoma anterior to the left hip, unchanged from previous. There is significant streak artifact related to the patient's bilateral hip  arthroplasties obscuring pelvis. There is also motion and artifact from the patient's arms obscuring the abdomen and pelvis. Musculoskeletal: Degenerative changes identified along the spine and pelvis with some areas of stenosis in the lumbar spine. Review of the MIP images confirms the above findings. IMPRESSION: Limited examination due to multiple artifacts particularly along the thorax but also some in the abdomen and pelvis. No obvious aortic aneurysm or dissection. Again there are areas on the thorax which were very limited of the aorta and branch vessels. Diffuse atherosclerotic changes identified in the chest, abdomen and pelvis. Slight ectasia of the left common iliac artery. Multilevel mild branch vessels stenosis particularly of the celiac. Defibrillator.  Enlarged heart. Gallstones. Cystic lesion in the left adnexa, likely ovarian. This was not seen on the prior from 2010. Would recommend simple follow-up ultrasound in 3 months to assess  stability. Tiny pleural effusions.  Emphysematous lung changes. Electronically Signed   By: Jill Side M.D.   On: 04/18/2022 12:18   CT Head Wo Contrast  Result Date: 04/18/2022 CLINICAL DATA:  Trauma.  Vomiting. EXAM: CT HEAD WITHOUT CONTRAST TECHNIQUE: Contiguous axial images were obtained from the base of the skull through the vertex without intravenous contrast. RADIATION DOSE REDUCTION: This exam was performed according to the departmental dose-optimization program which includes automated exposure control, adjustment of the mA and/or kV according to patient size and/or use of iterative reconstruction technique. COMPARISON:  12/19/2013 FINDINGS: Brain: There is no evidence for acute hemorrhage, hydrocephalus, mass lesion, or abnormal extra-axial fluid collection. No definite CT evidence for acute infarction. Diffuse loss of parenchymal volume is consistent with atrophy. Patchy low attenuation in the deep hemispheric and periventricular white matter is  nonspecific, but likely reflects chronic microvascular ischemic demyelination. Vascular: No hyperdense vessel or unexpected calcification. Skull: No evidence for fracture. No worrisome lytic or sclerotic lesion. Sinuses/Orbits: The visualized paranasal sinuses and mastoid air cells are clear. Visualized portions of the globes and intraorbital fat are unremarkable. Other: None. IMPRESSION: 1. No acute intracranial abnormality. 2. Atrophy with chronic small vessel ischemic disease. Electronically Signed   By: Misty Stanley M.D.   On: 04/18/2022 10:13   DG Chest Port 1 View  Addendum Date: 04/18/2022   ADDENDUM REPORT: 04/18/2022 10:00 ADDENDUM: Given chest discomfort, CTA of the chest, abdomen and pelvis was recommended to evaluate for aortic dissection versus aneurysm. Critical Value/emergent results were called by telephone at the time of interpretation on 04/18/2022 at 947 am to provider [facility] , who verbally acknowledged these results. Electronically Signed   By: Emmit Alexanders M.D.   On: 04/18/2022 10:00   Result Date: 04/18/2022 CLINICAL DATA:  Dyspnea, nausea and vomiting. EXAM: PORTABLE CHEST 1 VIEW COMPARISON:  Chest radiographs 11/07/2018. FINDINGS: Left chest AICD/pacemaker with leads projecting over the right atrium, right ventricle and coronary sinus. Fourth lead projecting over the right atrium appears to be fractured as it crosses the right atrial pacing lead and right ventricular defibrillator lead. Moderately increased heart size with considerable expansion of the aortic arch calcifications since August 2020. Low lung volumes with probable mild pulmonary edema. No pleural effusion or pneumothorax. IMPRESSION: 1. Considerable expansion of the aortic arch calcifications since August 2020. In the presence of moderately increased heart size, findings are concerning for acute aortic syndrome. Thoracic aortic aneurysm is a differential consideration. 2. Probable mild pulmonary edema. 3. Possibly  fractured right heart lead from left chest AICD/pacemaker. Electronically Signed: By: Emmit Alexanders M.D. On: 04/18/2022 09:46   DG Abdomen 1 View  Result Date: 04/18/2022 CLINICAL DATA:  Dyspnea, nausea and vomiting. Abdominal discomfort all across her abdomen. EXAM: ABDOMEN - 1 VIEW COMPARISON:  Pelvis radiograph 09/04/2014. FINDINGS: The bowel gas pattern is normal. No radio-opaque calculi or other significant radiographic abnormality are seen. Unchanged appearance of bilateral hip hemiarthroplasties. IMPRESSION: Negative. Electronically Signed   By: Emmit Alexanders M.D.   On: 04/18/2022 09:38     All images have been reviewed by me personally.  EKG: Independently reviewed. A/V paced.   Assessment/Plan Principal Problem:   Acute exacerbation of CHF (congestive heart failure) (HCC) Active Problems:   Hyperlipidemia   HYPERTENSION, BENIGN SYSTEMIC   Biventricular ICD (implantable cardioverter-defibrillator)-[employer]. [employer]   Nonischemic cardiomyopathy (HCC)   Atrial fibrillation (HCC)   Acute Systolic CHF exac ef 16%, Class 3 BiVICD -Admit to tele. Start Lasix '40mg'$  iv bid,  Update Echo (last Echo >5 yrs ago). Fluid restriction, daily weight, I/Os. Cardiology consulted, will see the ptn tomorrow am. Sees Dr Caryl Comes outpatient.  -CTA showed some pleural effusion, nothing acute  Nausea/Vomiting -Likely from bowel wall edema. No other acute abnormality on on CT. Antiemetics.   Left lower extremity cellulitis without purulent discharge - Very mild symptoms.  Will place her on Keflex  Hypocalcemia/Hypokalemia/hypomagnesemia -Replete prn  Fall -CTH is neg. Likely from weakness. Will get PT/OT  Macrocytosis -Will check TSH, B12 and folate.   Paroxysmal A fib -On Coreg, not on Anticoagulation.   Essential Hypertension  -On Coreg, IV Prn  Fatigue -On Mirtazapine. Prescribed by outpatient provider  DVT prophylaxis: Hep SQ Code Status: Patient confirms DNR Family Communication: Called  her daughter, she is uptodate Consults called: [facility] cardiology consulted, will see the patient tomorrow Admission status: InPatient Tele  Status is: Inpatient Remains inpatient appropriate because: Inpatient Tele Admit.    Time Spent: 65 minutes.  >50% of the time was devoted to discussing the patients care, assessment, plan and disposition with other care givers along with counseling the patient about the risks and benefits of treatment.    Otillia Cordone Arsenio Loader MD [facility]  If 7PM-7AM, please contact night-coverage   04/18/2022, 1:49 PM

## 2022-04-18 NOTE — Progress Notes (Signed)
Remote pacemaker transmission.   

## 2022-04-18 NOTE — Telephone Encounter (Signed)
Late entry - called daughter back this AM and spoke with her Patient is in ED and being admitted to [facility] Daughter appreciative  [name], MD

## 2022-04-18 NOTE — ED Triage Notes (Addendum)
Patient BIB [facility] from home. Nausea since 7pm along with vomiting a couple times last night. Abdominal discomfort all across her stomach.

## 2022-04-18 NOTE — Telephone Encounter (Signed)
Patients daughter calls nurse line requesting to speak with with PCP.   She reports she had to call EMS this morning and patient is currently at [facility]. She reports her mother was vomiting all last night and into this morning. She reports her mother texted her this morning asking for her to come over and call the ambulance.   Daughter unsure if patient was experiencing any diarrhea or fevers.   Benjamine Mola is asking for PCP to call her. [phone].

## 2022-04-18 NOTE — ED Provider Notes (Signed)
[name] Provider Note   CSN: [id number] Arrival date & time: 04/18/22  2841     History  Chief Complaint  Patient presents with   Shortness of Breath    [name] is a 89 y.o. female.   Shortness of Breath    Patient has history of nonischemic cardiomyopathy, systolic CHF, hyperlipidemia, hypertension, osteoporosis, Raynaud's disease, paroxysmal A-fib, status post pacemaker.  Patient presents to the ED with a few concerns.  Patient states her peripheral neuropathy is acting up.  He has discomfort in her legs.  This is not a new issue for her.  Patient however states acutely she started having difficulty with her breathing last night.  She has noticed a discomfort in her chest but it is ill defined.  She states she has been having trouble with her breathing throughout the night into this morning.  She mentions some nausea and per EMS report she had abdominal discomfort although patient denies any abdominal pain to me.  She has noticed leg swelling and some weeping of her legs recently.  She denies diarrhea or dysuria.  Pt also reports some stumbling and falls recently.  Home Medications Prior to Admission medications   Medication Sig Start Date End Date Taking? Authorizing Provider  acetaminophen (TYLENOL) 650 MG CR tablet Take 1,300 mg by mouth in the morning and at bedtime.    [provider]  carvedilol (COREG) 6.25 MG tablet TAKE 1 TABLET BY MOUTH TWICE DAILY WITH FOOD 01/13/22   Regina Resides, MD  Cranberry 250 MG TABS Take by mouth in the morning and at bedtime.    [provider]  mirtazapine (REMERON) 15 MG tablet TAKE 1 TABLET(15 MG) BY MOUTH AT BEDTIME 03/18/22   Regina Rio, MD  Multiple Vitamins-Minerals (MULTIVITAMIN WITH MINERALS) tablet Take 1 tablet by mouth daily. Patient not taking: Reported on 11/16/2020    [provider]  torsemide (DEMADEX) 20 MG tablet TAKE 1 TABLET BY MOUTH EVERY  OTHER DAY 04/08/22   Regina Rio, MD      Allergies    Sulfamethoxazole    Review of Systems   Review of Systems  Respiratory:  Positive for shortness of breath.     Physical Exam Updated Vital Signs BP 97/71   Pulse (!) 101   Temp 97.7 F (36.5 C) (Oral)   Resp (!) 23   SpO2 100%  Physical Exam Vitals and nursing note reviewed.  Constitutional:      Appearance: She is well-developed. She is not diaphoretic.  HENT:     Head: Normocephalic and atraumatic.     Right Ear: External ear normal.     Left Ear: External ear normal.  Eyes:     General: No scleral icterus.       Right eye: No discharge.        Left eye: No discharge.     Conjunctiva/sclera: Conjunctivae normal.  Neck:     Trachea: No tracheal deviation.  Cardiovascular:     Rate and Rhythm: Normal rate and regular rhythm.  Pulmonary:     Effort: Pulmonary effort is normal. No respiratory distress.     Breath sounds: Normal breath sounds. No stridor. No wheezing or rales.  Abdominal:     General: Bowel sounds are normal. There is no distension.     Palpations: Abdomen is soft.     Tenderness: There is no abdominal tenderness. There is no guarding or rebound.  Musculoskeletal:  General: No tenderness or deformity.     Cervical back: Neck supple.     Right lower leg: Edema present.     Left lower leg: Edema present.  Skin:    General: Skin is warm and dry.     Findings: No rash.  Neurological:     General: No focal deficit present.     Mental Status: She is alert.     Cranial Nerves: No cranial nerve deficit, dysarthria or facial asymmetry.     Sensory: No sensory deficit.     Motor: No abnormal muscle tone or seizure activity.     Coordination: Coordination normal.  Psychiatric:        Mood and Affect: Mood normal.     ED Results / Procedures / Treatments   Labs (all labs ordered are listed, but only abnormal results are displayed) Labs Reviewed  COMPREHENSIVE METABOLIC PANEL -  Abnormal; Notable for the following components:      Result Value   Potassium 3.0 (*)    Glucose, Bld 155 (*)    Calcium 8.8 (*)    Total Bilirubin 1.5 (*)    All other components within normal limits  CBC - Abnormal; Notable for the following components:   WBC 11.3 (*)    Hemoglobin 15.1 (*)    MCV 104.3 (*)    MCH 34.5 (*)    Platelets 108 (*)    All other components within normal limits  BRAIN NATRIURETIC PEPTIDE - Abnormal; Notable for the following components:   B Natriuretic Peptide 3,630.7 (*)    All other components within normal limits  I-STAT CHEM 8, ED - Abnormal; Notable for the following components:   Potassium 3.0 (*)    Chloride 95 (*)    Glucose, Bld 158 (*)    Calcium, Ion 1.13 (*)    TCO2 33 (*)    Hemoglobin 16.7 (*)    HCT 49.0 (*)    All other components within normal limits  RESP PANEL BY RT-PCR (RSV, FLU A&B, COVID)  RVPGX2  LIPASE, BLOOD  TROPONIN I (HIGH SENSITIVITY)  TROPONIN I (HIGH SENSITIVITY)    EKG EKG Interpretation  Date/Time:  Friday April 18 2022 10:18:20 EST Ventricular Rate:  90 PR Interval:  156 QRS Duration: 159 QT Interval:  461 QTC Calculation: 565 R Axis:   -84 Text Interpretation: Electronic ventricular pacemaker No significant change since last tracing Confirmed by Dorie Rank [phone]) on 04/18/2022 10:23:04 AM  Radiology CT Angio Chest/Abd/Pel for Dissection W and/or Wo Contrast  Result Date: 04/18/2022 CLINICAL DATA:  Aortic acute syndrome. Pain. Nausea since 7 p.m. last night. Abdominal discomfort EXAM: CT ANGIOGRAPHY CHEST, ABDOMEN AND PELVIS TECHNIQUE: Non-contrast CT of the chest was initially obtained. Multidetector CT imaging through the chest, abdomen and pelvis was performed using the standard protocol during bolus administration of intravenous contrast. Multiplanar reconstructed images and MIPs were obtained and reviewed to evaluate the vascular anatomy. RADIATION DOSE REDUCTION: This exam was performed according to  the departmental dose-optimization program which includes automated exposure control, adjustment of the mA and/or kV according to patient size and/or use of iterative reconstruction technique. CONTRAST:  58m OMNIPAQUE IOHEXOL 350 MG/ML SOLN COMPARISON:  CT 2010 FINDINGS: CTA CHEST FINDINGS Cardiovascular: The heart is enlarged. Particularly involving the atria. There is a left chest battery pack identified with defibrillator lead extending along the heart. No significant pericardial effusion. The battery pack and motion does cause extensive artifacts as well as the arms being scanned at  the patient's side. The thoracic aorta has scattered atherosclerotic calcified plaque. No dissection clearly seen but there is a large number of artifacts across the thorax. A subtle abnormalities difficult to completely exclude. Diameter of the ascending aorta at the level of the right pulmonary artery measures 3.3 by 3.2 cm. The descending thoracic aorta 2.2 x 2.4 cm. The aortic arch has a diameter approaching 2.7 cm. A bovine type aortic arch is seen with scattered calcified plaque. There is significant artifact obscuring the left subclavian artery. Patency is difficult to determine its origin. Coronary artery calcifications are seen. Of note on the noncontrast dataset no definite mural hematoma along the aorta. Mediastinum/Nodes: No specific abnormal lymph node enlargement identified in the axillary region, hilum or mediastinum. Normal caliber thoracic esophagus. Lungs/Pleura: Advanced emphysematous lung changes. Apical pleural thickening identified. Slightly nodular opacity identified in the middle lobe measuring 10 by 7 mm on series 4, image 107. No pneumothorax. Tiny bilateral dependent pleural effusions. Musculoskeletal: Diffuse degenerative changes along the spine with some kyphosis. Osteopenia. Review of the MIP images confirms the above findings. CTA ABDOMEN AND PELVIS FINDINGS VASCULAR Aorta: Normal caliber abdominal  aorta with diffuse moderate calcified atherosclerotic plaque. No dissection or aneurysm formation. Celiac: There is significant calcified plaque at the origin of the celiac with a stenosis of close to 50%. SMA: Mild plaque at the origin and proximal portion vessel without significant stenosis. Renals: There is some mild plaque noted along the proximal aspects of both renal arteries. No significant stenosis on the left. Mild on the right, less than 50%. IMA: Grossly patent. Inflow: There is ectasia of the left common iliac artery measuring up to 11 mm. Scattered partially calcified plaque along the iliac vessels diffusely bilaterally. There is some noncalcified plaque as well along the external iliac vessels with some mild stenosis. Veins: Grossly caliber IVC.  Contrast is in the arterial phase. Review of the MIP images confirms the above findings. NON-VASCULAR Hepatobiliary: With the limits of the arterial phase of the contrast bolus, grossly the liver is unremarkable. No obvious mass lesion. There is some stone seen in the dependent portion of the nondilated gallbladder. Pancreas: Moderate atrophy Spleen: . nonenlarged. Adrenals/Urinary Tract: Adrenal glands are preserved. Mild bilateral renal atrophy. Small lower pole right-sided renal cyst. Maximum diameter of 2.3 cm. Hounsfield unit of 12. Similar upper pole left-sided focus with Hounsfield units of 4 and diameter of 17 mm. Bosniak 1 lesions. No specific imaging follow-up recommended. No collecting system dilatation. Preserved contours of the urinary bladder. Stomach/Bowel: On this non oral contrast exam, the large bowel has a normal course and caliber with scattered colonic stool. There is some high density material noted along loops of colon and distal ileum, nonspecific. Please correlate for ingested material. Stomach and small bowel are nondilated. Lymphatic: No specific abnormal lymph node enlargement identified in the abdomen and pelvis. Reproductive:  Uterus is not seen. There is a cystic lesion in the left adnexa towards the pelvic sidewall measuring 2.2 by 1.5 cm. This could be ovarian. Hounsfield unit of 8. This was not seen in 1,010. Other: Anasarca identified. No significant ascites. Focal lipoma anterior to the left hip, unchanged from previous. There is significant streak artifact related to the patient's bilateral hip arthroplasties obscuring pelvis. There is also motion and artifact from the patient's arms obscuring the abdomen and pelvis. Musculoskeletal: Degenerative changes identified along the spine and pelvis with some areas of stenosis in the lumbar spine. Review of the MIP images confirms the above  findings. IMPRESSION: Limited examination due to multiple artifacts particularly along the thorax but also some in the abdomen and pelvis. No obvious aortic aneurysm or dissection. Again there are areas on the thorax which were very limited of the aorta and branch vessels. Diffuse atherosclerotic changes identified in the chest, abdomen and pelvis. Slight ectasia of the left common iliac artery. Multilevel mild branch vessels stenosis particularly of the celiac. Defibrillator.  Enlarged heart. Gallstones. Cystic lesion in the left adnexa, likely ovarian. This was not seen on the prior from 2010. Would recommend simple follow-up ultrasound in 3 months to assess stability. Tiny pleural effusions.  Emphysematous lung changes. Electronically Signed   By: Jill Side M.D.   On: 04/18/2022 12:18   CT Head Wo Contrast  Result Date: 04/18/2022 CLINICAL DATA:  Trauma.  Vomiting. EXAM: CT HEAD WITHOUT CONTRAST TECHNIQUE: Contiguous axial images were obtained from the base of the skull through the vertex without intravenous contrast. RADIATION DOSE REDUCTION: This exam was performed according to the departmental dose-optimization program which includes automated exposure control, adjustment of the mA and/or kV according to patient size and/or use of iterative  reconstruction technique. COMPARISON:  12/19/2013 FINDINGS: Brain: There is no evidence for acute hemorrhage, hydrocephalus, mass lesion, or abnormal extra-axial fluid collection. No definite CT evidence for acute infarction. Diffuse loss of parenchymal volume is consistent with atrophy. Patchy low attenuation in the deep hemispheric and periventricular white matter is nonspecific, but likely reflects chronic microvascular ischemic demyelination. Vascular: No hyperdense vessel or unexpected calcification. Skull: No evidence for fracture. No worrisome lytic or sclerotic lesion. Sinuses/Orbits: The visualized paranasal sinuses and mastoid air cells are clear. Visualized portions of the globes and intraorbital fat are unremarkable. Other: None. IMPRESSION: 1. No acute intracranial abnormality. 2. Atrophy with chronic small vessel ischemic disease. Electronically Signed   By: Misty Stanley M.D.   On: 04/18/2022 10:13   DG Chest Port 1 View  Addendum Date: 04/18/2022   ADDENDUM REPORT: 04/18/2022 10:00 ADDENDUM: Given chest discomfort, CTA of the chest, abdomen and pelvis was recommended to evaluate for aortic dissection versus aneurysm. Critical Value/emergent results were called by telephone at the time of interpretation on 04/18/2022 at 947 am to provider [facility] , who verbally acknowledged these results. Electronically Signed   By: Emmit Alexanders M.D.   On: 04/18/2022 10:00   Result Date: 04/18/2022 CLINICAL DATA:  Dyspnea, nausea and vomiting. EXAM: PORTABLE CHEST 1 VIEW COMPARISON:  Chest radiographs 11/07/2018. FINDINGS: Left chest AICD/pacemaker with leads projecting over the right atrium, right ventricle and coronary sinus. Fourth lead projecting over the right atrium appears to be fractured as it crosses the right atrial pacing lead and right ventricular defibrillator lead. Moderately increased heart size with considerable expansion of the aortic arch calcifications since August 2020. Low lung volumes  with probable mild pulmonary edema. No pleural effusion or pneumothorax. IMPRESSION: 1. Considerable expansion of the aortic arch calcifications since August 2020. In the presence of moderately increased heart size, findings are concerning for acute aortic syndrome. Thoracic aortic aneurysm is a differential consideration. 2. Probable mild pulmonary edema. 3. Possibly fractured right heart lead from left chest AICD/pacemaker. Electronically Signed: By: Emmit Alexanders M.D. On: 04/18/2022 09:46   DG Abdomen 1 View  Result Date: 04/18/2022 CLINICAL DATA:  Dyspnea, nausea and vomiting. Abdominal discomfort all across her abdomen. EXAM: ABDOMEN - 1 VIEW COMPARISON:  Pelvis radiograph 09/04/2014. FINDINGS: The bowel gas pattern is normal. No radio-opaque calculi or other significant radiographic abnormality are  seen. Unchanged appearance of bilateral hip hemiarthroplasties. IMPRESSION: Negative. Electronically Signed   By: Emmit Alexanders M.D.   On: 04/18/2022 09:38    Procedures Procedures    Medications Ordered in ED Medications  iohexol (OMNIPAQUE) 350 MG/ML injection 90 mL (90 mLs Intravenous Contrast Given 04/18/22 1138)  furosemide (LASIX) injection 40 mg (40 mg Intravenous Given 04/18/22 1251)    ED Course/ Medical Decision Making/ A&P Clinical Course as of 04/18/22 1325  Fri Apr 18, 2022  0948 Discussed with radiology.  Increased heart size, could be pulmonary edema.  Increased aortic size concerning for possible dissection, recommend ct. [JK]  1118 CBC(!) CBC without anemia.  Mild leukocytosis [JK]  1119 I-stat chem 8, ED (not at [facility], [facility] or [facility])(!) Creatinine normal [JK]  1237 Resp panel by RT-PCR (RSV, Flu A&B, Covid) Anterior Nasal Swab Covid flu negative [JK]  1237 Troponin I (High Sensitivity) Troponin normal [JK]  1237 Lipase, blood Lipase normal [JK]  1237 CT angiogram of the chest without evidence of aortic aneurysm or dissection.  Patient does have enlarged heart.  She also  has gallstones.  She has a cystic lesion in the adnexal region.  Outpatient ultrasound recommended [JK]  1310 Brain natriuretic peptide(!) BNP elevated 3630 [JK]    Clinical Course User Index [JK] Dorie Rank, MD                             Medical Decision Making Problems Addressed: Acute on chronic congestive heart failure, unspecified heart failure type [facility]): acute illness or injury that poses a threat to life or bodily functions Adnexal mass: undiagnosed new problem with uncertain prognosis Gallstones: chronic illness or injury  Amount and/or Complexity of Data Reviewed Labs: ordered. Decision-making details documented in ED Course. Radiology: ordered and independent interpretation performed. Discussion of management or test interpretation with external provider(s): Case discussed with Dr Reesa Chew regarding admission  Risk Prescription drug management. Decision regarding hospitalization.   Patient presented to the ED for evaluation of shortness of breath.  Patient also mentioned some nausea.  She also had a recent fall.  Patient's symptoms concerning for CHF exacerbation with her shortness of breath and cardiomegaly.  Patient also has noticed increasing peripheral edema and serous drainage from a small wound in her leg.  Patient does not have any surrounding erythema I doubt cellulitis.  She does have significant edema there is a small break in the skin.  I suspect her drainage is related to her significant lymphedema.  There was concerns about possible aneurysm on the chest x-ray however CT angiogram does not show any acute finding.  Incidental gallstones noted.  Patient will also require outpatient follow-up on an adnexal lesion.  Patient does not have an oxygen requirement however she gets very short of breath and weak with trying to ambulate.  I think she would benefit from IV diuresis and further treatment.  I have ordered Lasix IV and I will consult the medical service for admission  and further treatment.        Final Clinical Impression(s) / ED Diagnoses Final diagnoses:  Adnexal mass  Gallstones  Acute on chronic congestive heart failure, unspecified heart failure type [facility])    Rx / DC Orders ED Discharge Orders     None         Dorie Rank, MD 04/19/22 1623

## 2022-04-19 DIAGNOSIS — I5043 Acute on chronic combined systolic (congestive) and diastolic (congestive) heart failure: Secondary | ICD-10-CM | POA: Diagnosis not present

## 2022-04-19 DIAGNOSIS — I509 Heart failure, unspecified: Secondary | ICD-10-CM | POA: Diagnosis not present

## 2022-04-19 LAB — BASIC METABOLIC PANEL
Anion gap: 8 (ref 5–15)
BUN: 11 mg/dL (ref 8–23)
CO2: 28 mmol/L (ref 22–32)
Calcium: 9.2 mg/dL (ref 8.9–10.3)
Chloride: 103 mmol/L (ref 98–111)
Creatinine, Ser: 0.7 mg/dL (ref 0.44–1.00)
GFR, Estimated: >60 mL/min (ref >60)
Glucose, Bld: 133 mg/dL — ABNORMAL HIGH (ref 70–99)
Potassium: 4.8 mmol/L (ref 3.5–5.1)
Sodium: 139 mmol/L (ref 135–145)

## 2022-04-19 LAB — FOLATE: Folate: 10.5 ng/mL (ref >5.9)

## 2022-04-19 LAB — CBC
HCT: 41.8 % (ref 36.0–46.0)
Hemoglobin: 13.7 g/dL (ref 12.0–15.0)
MCH: 34.3 pg — ABNORMAL HIGH (ref 26.0–34.0)
MCHC: 32.8 g/dL (ref 30.0–36.0)
MCV: 104.5 fL — ABNORMAL HIGH (ref 80.0–100.0)
Platelets: 103 10*3/uL — ABNORMAL LOW (ref 150–400)
RBC: 4 MIL/uL (ref 3.87–5.11)
RDW: 15.7 % — ABNORMAL HIGH (ref 11.5–15.5)
WBC: 6.8 10*3/uL (ref 4.0–10.5)
nRBC: 0 % (ref 0.0–0.2)

## 2022-04-19 LAB — TSH: TSH: 2.224 u[IU]/mL (ref 0.350–4.500)

## 2022-04-19 LAB — MAGNESIUM: Magnesium: 1.9 mg/dL (ref 1.7–2.4)

## 2022-04-19 LAB — VITAMIN B12: Vitamin B-12: 535 pg/mL (ref 180–914)

## 2022-04-19 MED ORDER — BISACODYL 10 MG RE SUPP: 10.0000 mg | Freq: Every day | RECTAL | Status: DC | PRN

## 2022-04-19 MED ORDER — B COMPLEX-C PO TABS
1.0000 | ORAL_TABLET | Freq: Every day | ORAL | Status: DC
  Administered 2022-04-19 – 2022-04-21 (×3): 1 via ORAL
  Filled 2022-04-19 (×3): qty 1

## 2022-04-19 NOTE — TOC Progression Note (Signed)
Transition of Care [facility]) - Progression Note    Patient Details  Name: [name] MRN: [id number] Date of Birth: [birth year]  Transition of [facility]) CM/SW Contact  Henrietta Dine, RN Phone Number: 04/19/2022, 4:50 PM  Clinical Narrative:     TOC order received for Heart Failure Home Health Screening. Patient has been reviewed and does not meet criteria.         Expected Discharge Plan and Services                                               Social Determinants of Health (SDOH) Interventions SDOH Screenings   Food Insecurity: No Food Insecurity (04/18/2022)  Housing: Low Risk  (04/18/2022)  Transportation Needs: No Transportation Needs (04/18/2022)  Utilities: Not At Risk (04/18/2022)  Depression (PHQ2-9): Low Risk  (10/24/2021)  Financial Resource Strain: Low Risk  (12/24/2017)  Physical Activity: Sufficiently Active (12/24/2017)  Social Connections: Moderately Isolated (12/24/2017)  Stress: No Stress Concern Present (12/24/2017)  Tobacco Use: Medium Risk (04/18/2022)    Readmission Risk Interventions     No data to display

## 2022-04-19 NOTE — Progress Notes (Signed)
[facility] Progress Note Patient: [name] BTC:[id number] DOB: [birth year] DOA: 04/18/2022  DOS: the patient was seen and examined on 04/19/2022  Brief hospital course: PMH of combined CHF, HTN, PAF, HTN, HLD, present to the hospital with complaints of nausea and vomiting and was actually found to have acute on chronic combined systolic and diastolic CHF.  Currently receiving IV diuresis with improvement in symptoms.  Cardiology consulted. Assessment and Plan: Acute on chronic systolic and diastolic combined CHF. EF 2025%. Currently receiving IV diuresis. Cardiology consulted. Management per them. Monitor.  Intractable nausea and vomiting. Likely in the setting of body wall edema. Continue symptomatic relief. Continue stool softeners.  Chronic venous stasis dermatitis. No evidence of cellulitis. Will monitor.  Recent fall. CT head unremarkable. PT OT consult.  Macrocytosis. B12 stable.  TSH stable.  Monitor.  Paroxysmal A-fib On rate control medication.  Not on any anticoagulation.  HTN. Blood pressure stable. Monitor.  Underweight Body mass index is 15.37 kg/m.  Placing the patient at high risk for poor outcome.  Subjective: No nausea no vomiting no fever no chills.  No chest pain.  Continues to have shortness of breath.  Still has swelling in the leg although improving.  Physical Exam: General: in Mild distress, No Rash Cardiovascular: S1 and S2 Present, No Murmur Respiratory: Good respiratory effort, Bilateral Air entry present. No Crackles, No wheezes Abdomen: Bowel Sound present, No tenderness Extremities: Bilateral lower extremity skin changes of venous stasis with edema Neuro: Alert and oriented x3, no new focal deficit  Data Reviewed: I have Reviewed nursing notes, Vitals, and Lab results. Since last encounter, pertinent lab results CBC and BMP   . I have ordered test including CBC and BMP  .   Disposition: Status is: Inpatient Remains  inpatient appropriate because: Continue diuresis  heparin injection 5,000 Units Start: 04/18/22 2200   Family Communication: Daughter is at bedside Level of care: Telemetry Continue telemetry from CHF Vitals:   04/19/22 0225 04/19/22 0500 04/19/22 0540 04/19/22 1413  BP: 109/64  111/62 116/62  Pulse: 91  84 84  Resp: (!) '21  20 16  '$ Temp: 98 F (36.7 C)  98.1 F (36.7 C)   TempSrc: Oral  Oral   SpO2: 99%  99% 100%  Weight:  41.9 kg    Height:         Author: Berle Mull, MD 04/19/2022 7:28 PM  Please look on [email or web address] to find out who is on call.

## 2022-04-19 NOTE — Consult Note (Signed)
Cardiology Consultation   Patient ID: ANAHIS FURGESON MRN: [id number]; DOB: March 28, 1930  Admit date: 04/18/2022 Date of Consult: 04/19/2022  PCP:  [name], MD   Patient Profile:   IDARA WOODSIDE is a 89 y.o. female with a hx of CHF who is being seen 04/19/2022 for the evaluation of  at the request of Dr Posey Pronto.  History of Present Illness:   Ms. Hutt is a 89 yo with hx of NICM (Cath 2007, 2014, no CAD); HL, HTN, PAF  She is s/pBiV ICD in 2007/2012  Underwent CRTP placement in 2014 due to a lead dislodgment (St Jude)  echo in 2017 LVEF 25 to 30%.   She was last seen by Olin Pia in cardiology clinic in Aug 2022.  The pt was admitted yesterday with SOB, N/V   She had COVID 19 in Dec 2023   Recovered   Then developed increased SOB, orthopnea, chest pressure.   Also developed increased LE edema   Went to ER yesterday   In ER she was given IV lasix and admitted for CHF   The pt says she takes her meds    She is not sure what made her get more SOB   She says it was a gradual thing, then one morning woke up and couldn't breathe well   Currently breathing is improved and legs are less swollen    Past Medical History:  Diagnosis Date   Biventricular ICD (implantable cardioverter-defibrillator)-[employer]    Date of implant 2007 generator change 2012   Hyperlipidemia    Hypertension    Macro dislodgment of the left ventricular lead 09/09/2012   Nonischemic cardiomyopathy ([place])    Cath without coronary disease 2006   Nonischemic cardiomyopathy ([place])    Osteoporosis    PAF (paroxysmal atrial fibrillation) (HCC)    Short bursts of PAF. Asymptomatic.  Sees Dr Rex Kras.   Peripheral neuropathy, hereditary/idiopathic 2012   Raynaud phenomenon    Sinus tachycardia    Systolic CHF [facility])    Urge incontinence     Past Surgical History:  Procedure Laterality Date   ABDOMINAL HYSTERECTOMY  1970s   APPENDECTOMY     BI-VENTRICULAR PACEMAKER INSERTION N/A 03/28/2013   Procedure:  BI-VENTRICULAR PACEMAKER INSERTION (CRT-P);  Surgeon: Deboraha Sprang, MD;  Location: [facility] CATH LAB;  Service: Cardiovascular;  Laterality: N/A;   Biventricular ICD Implant  06/28/2010; 03/28/2013   STJ CRTD implanted 2012 with dislodgement of LV lead 2014; 03/28/2013 CRTP placed with new LV lead by Dr Caryl Comes   Bladder tack     CARDIAC CATHETERIZATION  03/2005   EF 25%, Normal arteries, Nonischemic CM   HIP ARTHROPLASTY Left 12/20/2013   Procedure: LEFT HIP HEMI ARTHROPLASTY;  Surgeon: Meredith Pel, MD;  Location: [place];  Service: Orthopedics;  Laterality: Left;   LEFT AND RIGHT HEART CATHETERIZATION WITH CORONARY ANGIOGRAM N/A 12/13/2012   Procedure: LEFT AND RIGHT HEART CATHETERIZATION WITH CORONARY ANGIOGRAM;  Surgeon: Blane Ohara, MD;  Location: [facility] CATH LAB;  Service: Cardiovascular;  Laterality: N/A;   NM MYOCAR PERF WALL MOTION  03/19/2010   EF 63%, low risk   PPM GENERATOR CHANGEOUT N/A 08/16/2019   Procedure: PPM GENERATOR CHANGEOUT;  Surgeon: Deboraha Sprang, MD;  Location: [place] CV LAB;  Service: Cardiovascular;  Laterality: N/A;   Suprapubic Urethrovesicular Suspension  1992   TOTAL HIP ARTHROPLASTY Right 09/04/2014   Procedure: BIPOLAR HIP ARTHROPLASTY ANTERIOR APPROACH;  Surgeon: Mcarthur Rossetti, MD;  Location:  [facility];  Service: Orthopedics;  Laterality: Right;   US ECHOCARDIOGRAPHY  11/18/2011   EF 40%, LA moderately dilated, mild AI       Inpatient Medications: Scheduled Meds:  carvedilol  6.25 mg Oral BID WC   furosemide  40 mg Intravenous BID   heparin  5,000 Units Subcutaneous Q8H   mirtazapine  15 mg Oral QHS   Continuous Infusions:  PRN Meds: acetaminophen, guaiFENesin, hydrALAZINE, ipratropium-albuterol, metoprolol tartrate, ondansetron (ZOFRAN) IV, senna-docusate, traZODone  Allergies:    Allergies  Allergen Reactions   Sulfamethoxazole Anaphylaxis, Shortness Of Breath and Swelling    Social History:   Social History   Socioeconomic History    Marital status: Widowed    Spouse name: Not on file   Number of children: 4   Years of education: masters   Highest education level: Master's degree (e.g., MA, MS, MEng, MEd, MSW, MBA)  Occupational History   Occupation: Retired- Advertising copywriter: RETIRED  Tobacco Use   Smoking status: Former    Packs/day: 1.00    Years: 15.00    Total pack years: 15.00    Types: Cigarettes    Quit date: 04/22/2002    Years since quitting: 20.0   Smokeless tobacco: Never  Vaping Use   Vaping Use: Never used  Substance and Sexual Activity   Alcohol use: No    Comment: Recovering Alcoholic 35 yrs ago. Quit 1979.   Drug use: No    Comment: Prescription drug addict x 41 years.   Sexual activity: Not Currently  Other Topics Concern   Not on file  Social History Narrative   Health Care POA: Oletta Lamas (h) [phone] (c) [phone]   [name] has copy of Living Will and last will and testament. Has made plans for body to be donated to [facility] for study.   Emergency Contact: Oletta Lamas   End of Life Plan: DNR/DNI   Who lives with you: Lives with son who is handicapped   Any pets: bird, [place]a   Diet: Patient has a varied diet and does not eat fish or a lot of meat. Has lost sense of taste and smell so only eats to sustain.   Exercise:  gardens, and cleans home   Seatbelts: Patient reports wearing seat belt when in vehicle.   Sun Exposure/Protection: Patient denies wearing sun protection    Hobbies: Reading, gardening, visiting family   Lives in single story home, no stairs, grab bars in bathroom, no throw rugs on floor. No firearms. Has smoke and CO2 detectors.      Widowed 4 children- son Delfino Lovett lives with her (mild MR).       Retired, formerly was Buyer, retail.  Recovered alcoholic, recovered prescription drug addict.  Quit 1979. Quit Smoking 2004.   Social Determinants of Health   Financial Resource Strain: Low Risk  (12/24/2017)   Overall Financial  Resource Strain (CARDIA)    Difficulty of Paying Living Expenses: Not hard at all  Food Insecurity: No Food Insecurity (04/18/2022)   Hunger Vital Sign    Worried About Running Out of Food in the Last Year: Never true    Ran Out of Food in the Last Year: Never true  Transportation Needs: No Transportation Needs (04/18/2022)   PRAPARE - Hydrologist (Medical): No    Lack of Transportation (Non-Medical): No  Physical Activity: Sufficiently Active (12/24/2017)   Exercise Vital Sign    Days of Exercise per  Week: 4 days    Minutes of Exercise per Session: 40 min  Stress: No Stress Concern Present (12/24/2017)   [place]    Feeling of Stress : Not at all  Social Connections: Moderately Isolated (12/24/2017)   Social Connection and Isolation Panel [NHANES]    Frequency of Communication with Friends and Family: More than three times a week    Frequency of Social Gatherings with Friends and Family: More than three times a week    Attends Religious Services: Never    Marine scientist or Organizations: No    Attends Archivist Meetings: Never    Marital Status: Widowed  Intimate Partner Violence: Not At Risk (04/18/2022)   Humiliation, Afraid, Rape, and Kick questionnaire    Fear of Current or Ex-Partner: No    Emotionally Abused: No    Physically Abused: No    Sexually Abused: No    Family History:    Family History  Problem Relation Age of Onset   Heart disease Mother    Heart disease Father    Cancer Father        Kidney   Arthritis Sister    Heart disease Brother    Diabetes Son    Mental retardation Son    Alcohol abuse Sister    Cancer Brother    Cancer Daughter    Heart disease Daughter      ROS:  Please see the history of present illness.   All other ROS reviewed and negative.     Physical Exam/Data:   Vitals:   04/18/22 2046 04/19/22 0225 04/19/22 0500  04/19/22 0540  BP: 119/63 109/64  111/62  Pulse: 95 91  84  Resp: 17 (!) 21  20  Temp: 98.2 F (36.8 C) 98 F (36.7 C)  98.1 F (36.7 C)  TempSrc: Oral Oral  Oral  SpO2: 100% 99%  99%  Weight:   41.9 kg   Height:        Intake/Output Summary (Last 24 hours) at 04/19/2022 0705 Last data filed at 04/18/2022 2100 Gross per 24 hour  Intake 440 ml  Output 800 ml  Net -360 ml      04/19/2022    5:00 AM 04/18/2022    5:00 PM 10/24/2021   10:19 AM  Last 3 Weights  Weight (lbs) 92 lb 6 oz 95 lb 3.8 oz 97 lb 3.2 oz  Weight (kg) 41.9 kg 43.2 kg 44.09 kg     Body mass index is 15.37 kg/m.  General:  Very thin 89 yo in no acute distress HEENT: normal Neck: Prominent external jugular veins    Radiating murmurs  Vascular: No carotid bruits; Distal pulses 2+ bilaterally Cardiac:  normal S1, S2; RRR;   II/VI systolic murmur LSB  II/VI systolic murmur at apex   Lungs:  clear to auscultation bilaterally, no wheezing, rhonchi or rales  Abd: soft, nontender, no hepatomegaly  Ext: Tr to 1+  edema   LE extremities  Musculoskeletal:  No deformities, BUE and BLE strength normal and equal Skin: warm and dry  Neuro:  CNs 2-12 intact, no focal abnormalities noted Psych:  Normal affect   EKG:  The EKG was personally reviewed and demonstrates:  SR  90 bpm  BiV pacer Telemetry:  Telemetry was personally reviewed and demonstrates:  SR   Relevant CV Studies:  Echo 04/18/21 1. Left ventricular ejection fraction, by estimation, is 20 to 25%. The  left ventricle has severely decreased function. The left ventricle  demonstrates global hypokinesis. The left ventricular internal cavity size  was moderately dilated. Left  ventricular diastolic parameters are consistent with Grade II diastolic  dysfunction (pseudonormalization). Elevated left atrial pressure.   2. Right ventricular systolic function is mildly reduced. The right  ventricular size is normal. There is moderately elevated pulmonary artery   systolic pressure. The estimated right ventricular systolic pressure is  85.6 mmHg.   3. Left atrial size was severely dilated.   4. Right atrial size was severely dilated.   5. A small pericardial effusion is present.   6. The mitral valve is abnormal. Severe mitral valve regurgitation.  Appears functional. No evidence of mitral stenosis.   7. Tricuspid valve regurgitation is moderate.   8. The aortic valve was not well visualized. Aortic valve regurgitation  is mild. No aortic stenosis is present.   9. The inferior vena cava is dilated in size with <50% respiratory  variability, suggesting right atrial pressure of 15 mmHg.   Laboratory Data:  High Sensitivity Troponin:   Recent Labs  Lab 04/18/22 1025 04/18/22 1100 04/18/22 1600 04/18/22 1624  TROPONINIHS '15 14 13 16     '$ Chemistry Recent Labs  Lab 04/18/22 1025 04/18/22 1031 04/18/22 1100 04/18/22 1350 04/19/22 0448  NA 136 139  --   --  139  K 3.0* 3.0*  --   --  4.8  CL 98 95*  --   --  103  CO2 29  --   --   --  28  GLUCOSE 155* 158*  --   --  133*  BUN 10 8  --   --  11  CREATININE 0.76 0.70 0.75  --  0.70  CALCIUM 8.8*  --   --   --  9.2  MG  --   --   --  1.6* 1.9  GFRNONAA >60  --  >60  --  >60  ANIONGAP 9  --   --   --  8    Recent Labs  Lab 04/18/22 1025  PROT 6.8  ALBUMIN 3.7  AST 28  ALT 17  ALKPHOS 61  BILITOT 1.5*   Lipids No results for input(s): "CHOL", "TRIG", "HDL", "LABVLDL", "LDLCALC", "CHOLHDL" in the last 168 hours.  Hematology Recent Labs  Lab 04/18/22 1025 04/18/22 1031 04/18/22 1624 04/19/22 0448  WBC 11.3*  --  10.0 6.8  RBC 4.38  --  4.21 4.00  HGB 15.1* 16.7* 14.7 13.7  HCT 45.7 49.0* 43.5 41.8  MCV 104.3*  --  103.3* 104.5*  MCH 34.5*  --  34.9* 34.3*  MCHC 33.0  --  33.8 32.8  RDW 15.5  --  15.6* 15.7*  PLT 108*  --  106* 103*   Thyroid  Recent Labs  Lab 04/19/22 0448  TSH 2.224    BNP Recent Labs  Lab 04/18/22 1025  BNP 3,630.7*    DDimer No results  for input(s): "DDIMER" in the last 168 hours.   Radiology/Studies:  ECHOCARDIOGRAM COMPLETE  Result Date: 04/18/2022    ECHOCARDIOGRAM REPORT   Patient Name:   Kianah J Maffett Date of Exam: 04/18/2022 Medical Rec #:  [id number]    Height:       60.0 in Accession #:    [id number]   Weight:       97.2 lb Date of Birth:  05-16-1929   BSA:          1.374 m  Patient Age:    32 years     BP:           114/59 mmHg Patient Gender: F            HR:           91 bpm. Exam Location:  [place] Procedure: 2D Echo, Color Doppler and Cardiac Doppler Indications:    Cardiomyopathy-Unspecified I42.9  History:        Patient has prior history of Echocardiogram examinations, most                 recent 04/03/2015. Cardiomyopathy and CHF, Pacemaker,                 Arrythmias:Atrial Fibrillation; Risk Factors:Dyslipidemia,                 Hypertension and Former Smoker.  Sonographer:    Greer Pickerel Referring Phys: [id number] [facility] AMIN  Sonographer Comments: Image acquisition challenging due to patient body habitus. IMPRESSIONS  1. Left ventricular ejection fraction, by estimation, is 20 to 25%. The left ventricle has severely decreased function. The left ventricle demonstrates global hypokinesis. The left ventricular internal cavity size was moderately dilated. Left ventricular diastolic parameters are consistent with Grade II diastolic dysfunction (pseudonormalization). Elevated left atrial pressure.  2. Right ventricular systolic function is mildly reduced. The right ventricular size is normal. There is moderately elevated pulmonary artery systolic pressure. The estimated right ventricular systolic pressure is 47.6 mmHg.  3. Left atrial size was severely dilated.  4. Right atrial size was severely dilated.  5. A small pericardial effusion is present.  6. The mitral valve is abnormal. Severe mitral valve regurgitation. Appears functional. No evidence of mitral stenosis.  7. Tricuspid valve regurgitation is moderate.  8. The  aortic valve was not well visualized. Aortic valve regurgitation is mild. No aortic stenosis is present.  9. The inferior vena cava is dilated in size with <50% respiratory variability, suggesting right atrial pressure of 15 mmHg. FINDINGS  Left Ventricle: Left ventricular ejection fraction, by estimation, is 20 to 25%. The left ventricle has severely decreased function. The left ventricle demonstrates global hypokinesis. The left ventricular internal cavity size was moderately dilated. There is no left ventricular hypertrophy. Left ventricular diastolic parameters are consistent with Grade II diastolic dysfunction (pseudonormalization). Elevated left atrial pressure. Right Ventricle: The right ventricular size is normal. Right vetricular wall thickness was not well visualized. Right ventricular systolic function is mildly reduced. There is moderately elevated pulmonary artery systolic pressure. The tricuspid regurgitant velocity is 3.04 m/s, and with an assumed right atrial pressure of 15 mmHg, the estimated right ventricular systolic pressure is 54.6 mmHg. Left Atrium: Left atrial size was severely dilated. Right Atrium: Right atrial size was severely dilated. Pericardium: A small pericardial effusion is present. Mitral Valve: The mitral valve is abnormal. Severe mitral valve regurgitation. No evidence of mitral valve stenosis. MV peak gradient, 5.4 mmHg. The mean mitral valve gradient is 2.0 mmHg. Tricuspid Valve: The tricuspid valve is normal in structure. Tricuspid valve regurgitation is moderate. Aortic Valve: The aortic valve was not well visualized. Aortic valve regurgitation is mild. Aortic regurgitation PHT measures 279 msec. No aortic stenosis is present. Pulmonic Valve: The pulmonic valve was grossly normal. Pulmonic valve regurgitation is mild. Aorta: The aortic root and ascending aorta are structurally normal, with no evidence of dilitation. Venous: The inferior vena cava is dilated in size with less  than 50% respiratory variability, suggesting right atrial  pressure of 15 mmHg. IAS/Shunts: The interatrial septum was not well visualized.  LEFT VENTRICLE PLAX 2D LVIDd:         5.15 cm   Diastology LVIDs:         4.60 cm   LV e' medial:    3.02 cm/s LV PW:         1.20 cm   LV E/e' medial:  46.0 LV IVS:        0.70 cm   LV e' lateral:   4.19 cm/s LVOT diam:     1.80 cm   LV E/e' lateral: 33.2 LV SV:         31 LV SV Index:   22 LVOT Area:     2.54 cm  RIGHT VENTRICLE RV S prime:     8.02 cm/s TAPSE (M-mode): 1.0 cm LEFT ATRIUM              Index        RIGHT ATRIUM           Index LA diam:        4.60 cm  3.35 cm/m   RA Area:     19.10 cm LA Vol (A2C):   122.0 ml 88.81 ml/m  RA Volume:   73.60 ml  53.58 ml/m LA Vol (A4C):   66.2 ml  48.19 ml/m LA Biplane Vol: 97.2 ml  70.76 ml/m  AORTIC VALVE             PULMONIC VALVE LVOT Vmax:   77.70 cm/s  PR End Diast Vel: 12.18 msec LVOT Vmean:  48.100 cm/s LVOT VTI:    0.120 m AI PHT:      279 msec  AORTA Ao Root diam: 3.50 cm Ao Asc diam:  3.20 cm MITRAL VALVE                TRICUSPID VALVE MV Area (PHT): 3.81 cm     TR Peak grad:   37.0 mmHg MV Area VTI:   0.79 cm     TR Vmax:        304.00 cm/s MV Peak grad:  5.4 mmHg MV Mean grad:  2.0 mmHg     SHUNTS MV Vmax:       1.16 m/s     Systemic VTI:  0.12 m MV Vmean:      63.5 cm/s    Systemic Diam: 1.80 cm MV Decel Time: 199 msec MR Peak grad: 106.5 mmHg MR Vmax:      516.00 cm/s MV E velocity: 139.00 cm/s MV A velocity: 101.00 cm/s MV E/A ratio:  1.38 Oswaldo Milian MD Electronically signed by Oswaldo Milian MD Signature Date/Time: 04/18/2022/4:58:38 PM    Final    CT Angio Chest/Abd/Pel for Dissection W and/or Wo Contrast  Result Date: 04/18/2022 CLINICAL DATA:  Aortic acute syndrome. Pain. Nausea since 7 p.m. last night. Abdominal discomfort EXAM: CT ANGIOGRAPHY CHEST, ABDOMEN AND PELVIS TECHNIQUE: Non-contrast CT of the chest was initially obtained. Multidetector CT imaging through the chest,  abdomen and pelvis was performed using the standard protocol during bolus administration of intravenous contrast. Multiplanar reconstructed images and MIPs were obtained and reviewed to evaluate the vascular anatomy. RADIATION DOSE REDUCTION: This exam was performed according to the departmental dose-optimization program which includes automated exposure control, adjustment of the mA and/or kV according to patient size and/or use of iterative reconstruction technique. CONTRAST:  61m OMNIPAQUE IOHEXOL 350 MG/ML SOLN COMPARISON:  CT 2010 FINDINGS: CTA CHEST FINDINGS  Cardiovascular: The heart is enlarged. Particularly involving the atria. There is a left chest battery pack identified with defibrillator lead extending along the heart. No significant pericardial effusion. The battery pack and motion does cause extensive artifacts as well as the arms being scanned at the patient's side. The thoracic aorta has scattered atherosclerotic calcified plaque. No dissection clearly seen but there is a large number of artifacts across the thorax. A subtle abnormalities difficult to completely exclude. Diameter of the ascending aorta at the level of the right pulmonary artery measures 3.3 by 3.2 cm. The descending thoracic aorta 2.2 x 2.4 cm. The aortic arch has a diameter approaching 2.7 cm. A bovine type aortic arch is seen with scattered calcified plaque. There is significant artifact obscuring the left subclavian artery. Patency is difficult to determine its origin. Coronary artery calcifications are seen. Of note on the noncontrast dataset no definite mural hematoma along the aorta. Mediastinum/Nodes: No specific abnormal lymph node enlargement identified in the axillary region, hilum or mediastinum. Normal caliber thoracic esophagus. Lungs/Pleura: Advanced emphysematous lung changes. Apical pleural thickening identified. Slightly nodular opacity identified in the middle lobe measuring 10 by 7 mm on series 4, image 107. No  pneumothorax. Tiny bilateral dependent pleural effusions. Musculoskeletal: Diffuse degenerative changes along the spine with some kyphosis. Osteopenia. Review of the MIP images confirms the above findings. CTA ABDOMEN AND PELVIS FINDINGS VASCULAR Aorta: Normal caliber abdominal aorta with diffuse moderate calcified atherosclerotic plaque. No dissection or aneurysm formation. Celiac: There is significant calcified plaque at the origin of the celiac with a stenosis of close to 50%. SMA: Mild plaque at the origin and proximal portion vessel without significant stenosis. Renals: There is some mild plaque noted along the proximal aspects of both renal arteries. No significant stenosis on the left. Mild on the right, less than 50%. IMA: Grossly patent. Inflow: There is ectasia of the left common iliac artery measuring up to 11 mm. Scattered partially calcified plaque along the iliac vessels diffusely bilaterally. There is some noncalcified plaque as well along the external iliac vessels with some mild stenosis. Veins: Grossly caliber IVC.  Contrast is in the arterial phase. Review of the MIP images confirms the above findings. NON-VASCULAR Hepatobiliary: With the limits of the arterial phase of the contrast bolus, grossly the liver is unremarkable. No obvious mass lesion. There is some stone seen in the dependent portion of the nondilated gallbladder. Pancreas: Moderate atrophy Spleen: . nonenlarged. Adrenals/Urinary Tract: Adrenal glands are preserved. Mild bilateral renal atrophy. Small lower pole right-sided renal cyst. Maximum diameter of 2.3 cm. Hounsfield unit of 12. Similar upper pole left-sided focus with Hounsfield units of 4 and diameter of 17 mm. Bosniak 1 lesions. No specific imaging follow-up recommended. No collecting system dilatation. Preserved contours of the urinary bladder. Stomach/Bowel: On this non oral contrast exam, the large bowel has a normal course and caliber with scattered colonic stool. There  is some high density material noted along loops of colon and distal ileum, nonspecific. Please correlate for ingested material. Stomach and small bowel are nondilated. Lymphatic: No specific abnormal lymph node enlargement identified in the abdomen and pelvis. Reproductive: Uterus is not seen. There is a cystic lesion in the left adnexa towards the pelvic sidewall measuring 2.2 by 1.5 cm. This could be ovarian. Hounsfield unit of 8. This was not seen in 1,010. Other: Anasarca identified. No significant ascites. Focal lipoma anterior to the left hip, unchanged from previous. There is significant streak artifact related to the patient's bilateral  hip arthroplasties obscuring pelvis. There is also motion and artifact from the patient's arms obscuring the abdomen and pelvis. Musculoskeletal: Degenerative changes identified along the spine and pelvis with some areas of stenosis in the lumbar spine. Review of the MIP images confirms the above findings. IMPRESSION: Limited examination due to multiple artifacts particularly along the thorax but also some in the abdomen and pelvis. No obvious aortic aneurysm or dissection. Again there are areas on the thorax which were very limited of the aorta and branch vessels. Diffuse atherosclerotic changes identified in the chest, abdomen and pelvis. Slight ectasia of the left common iliac artery. Multilevel mild branch vessels stenosis particularly of the celiac. Defibrillator.  Enlarged heart. Gallstones. Cystic lesion in the left adnexa, likely ovarian. This was not seen on the prior from 2010. Would recommend simple follow-up ultrasound in 3 months to assess stability. Tiny pleural effusions.  Emphysematous lung changes. Electronically Signed   By: Jill Side M.D.   On: 04/18/2022 12:18   CT Head Wo Contrast  Result Date: 04/18/2022 CLINICAL DATA:  Trauma.  Vomiting. EXAM: CT HEAD WITHOUT CONTRAST TECHNIQUE: Contiguous axial images were obtained from the base of the skull  through the vertex without intravenous contrast. RADIATION DOSE REDUCTION: This exam was performed according to the departmental dose-optimization program which includes automated exposure control, adjustment of the mA and/or kV according to patient size and/or use of iterative reconstruction technique. COMPARISON:  12/19/2013 FINDINGS: Brain: There is no evidence for acute hemorrhage, hydrocephalus, mass lesion, or abnormal extra-axial fluid collection. No definite CT evidence for acute infarction. Diffuse loss of parenchymal volume is consistent with atrophy. Patchy low attenuation in the deep hemispheric and periventricular white matter is nonspecific, but likely reflects chronic microvascular ischemic demyelination. Vascular: No hyperdense vessel or unexpected calcification. Skull: No evidence for fracture. No worrisome lytic or sclerotic lesion. Sinuses/Orbits: The visualized paranasal sinuses and mastoid air cells are clear. Visualized portions of the globes and intraorbital fat are unremarkable. Other: None. IMPRESSION: 1. No acute intracranial abnormality. 2. Atrophy with chronic small vessel ischemic disease. Electronically Signed   By: Misty Stanley M.D.   On: 04/18/2022 10:13   DG Chest Port 1 View  Addendum Date: 04/18/2022   ADDENDUM REPORT: 04/18/2022 10:00 ADDENDUM: Given chest discomfort, CTA of the chest, abdomen and pelvis was recommended to evaluate for aortic dissection versus aneurysm. Critical Value/emergent results were called by telephone at the time of interpretation on 04/18/2022 at 947 am to provider [facility] , who verbally acknowledged these results. Electronically Signed   By: Emmit Alexanders M.D.   On: 04/18/2022 10:00   Result Date: 04/18/2022 CLINICAL DATA:  Dyspnea, nausea and vomiting. EXAM: PORTABLE CHEST 1 VIEW COMPARISON:  Chest radiographs 11/07/2018. FINDINGS: Left chest AICD/pacemaker with leads projecting over the right atrium, right ventricle and coronary sinus. Fourth  lead projecting over the right atrium appears to be fractured as it crosses the right atrial pacing lead and right ventricular defibrillator lead. Moderately increased heart size with considerable expansion of the aortic arch calcifications since August 2020. Low lung volumes with probable mild pulmonary edema. No pleural effusion or pneumothorax. IMPRESSION: 1. Considerable expansion of the aortic arch calcifications since August 2020. In the presence of moderately increased heart size, findings are concerning for acute aortic syndrome. Thoracic aortic aneurysm is a differential consideration. 2. Probable mild pulmonary edema. 3. Possibly fractured right heart lead from left chest AICD/pacemaker. Electronically Signed: By: Emmit Alexanders M.D. On: 04/18/2022 09:46   DG Abdomen  1 View  Result Date: 04/18/2022 CLINICAL DATA:  Dyspnea, nausea and vomiting. Abdominal discomfort all across her abdomen. EXAM: ABDOMEN - 1 VIEW COMPARISON:  Pelvis radiograph 09/04/2014. FINDINGS: The bowel gas pattern is normal. No radio-opaque calculi or other significant radiographic abnormality are seen. Unchanged appearance of bilateral hip hemiarthroplasties. IMPRESSION: Negative. Electronically Signed   By: Emmit Alexanders M.D.   On: 04/18/2022 09:38     Assessment and Plan:   Acute on chronic HFrEF.   Pt with LVEF 20 to 25%   Presents with volume overload.     Pt says it happened gradually   ? If extending from time of viral infection in Dec.     OVerall volume improved from admit   Wt is down     I would continue to diurese with IV lasix   Follow response, follow renal function     :[id number]}   2  HTN  BP is controlled   3  PAF  Remains in SR   NOt on anticoagulation         For questions or updates, please contact [place] Please consult [email or web address] for contact info under    Signed, Dorris Carnes, MD  04/19/2022 7:05 AM

## 2022-04-19 NOTE — Hospital Course (Signed)
PMH of combined CHF, HTN, PAF, HTN, HLD, present to the hospital with complaints of nausea and vomiting and was actually found to have acute on chronic combined systolic and diastolic CHF.  Currently receiving IV diuresis with improvement in symptoms.  Cardiology consulted.

## 2022-04-19 NOTE — Evaluation (Signed)
Occupational Therapy Evaluation Patient Details Name: [name] MRN: [id number] DOB: 07/26/1929 Today's Date: 04/19/2022   History of Present Illness Patient is a 89 year old female who presented with nausea, vomiting and SOB with recent COVID 19. Patient was admitted with exacerbation of CHF, L lower extremity cellulitis hypocalcemia, hypokalemia, hypomagnesemia. PMH: systolic CHF, a fib, HTN.   Clinical Impression   Patient is a 89 year old female who was admitted for above. Patient was living at home independently with family support PRN.  Currently, patient is min guard with use of RW for transfers in bathroom with increased time. Patient was noted to be very quick to fatigue with poor activity tolerance and increased pain in LLE with touch. Patients two daughters in room reported they would be able to support patient 24/7 if needed at time of d/c. Patient would continue to benefit from skilled OT services at this time while admitted and after d/c to address noted deficits in order to improve overall safety and independence in ADLs.       Recommendations for follow up therapy are one component of a multi-disciplinary discharge planning process, led by the attending physician.  Recommendations may be updated based on patient status, additional functional criteria and insurance authorization.   Follow Up Recommendations  Home health OT     Assistance Recommended at Discharge Frequent or constant Supervision/Assistance  Patient can return home with the following A little help with walking and/or transfers;A little help with bathing/dressing/bathroom;Assistance with cooking/housework;Direct supervision/assist for medications management;Assist for transportation;Help with stairs or ramp for entrance;Direct supervision/assist for financial management    Functional Status Assessment  Patient has had a recent decline in their functional status and demonstrates the ability to make significant  improvements in function in a reasonable and predictable amount of time.  Equipment Recommendations  BSC/3in1       Precautions / Restrictions Precautions Precautions: Fall Restrictions Weight Bearing Restrictions: No      Mobility Bed Mobility Overal bed mobility: Needs Assistance Bed Mobility: Supine to Sit     Supine to sit: Supervision, HOB elevated             Balance Overall balance assessment: Mild deficits observed, not formally tested       ADL either performed or assessed with clinical judgement   ADL Overall ADL's : Needs assistance/impaired Eating/Feeding: Modified independent;Sitting Eating/Feeding Details (indicate cue type and reason): in recliner Grooming: Wash/dry face;Wash/dry hands;Supervision/safety;Standing Grooming Details (indicate cue type and reason): at sink with increased time Upper Body Bathing: Sitting;Set up   Lower Body Bathing: Sitting/lateral leans;Sit to/from stand;Min guard   Upper Body Dressing : Sitting;Set up   Lower Body Dressing: Min guard;Sitting/lateral leans;Sit to/from stand Lower Body Dressing Details (indicate cue type and reason): able to doff/don socks and shoes sitting EOB with no physical A. patient noted to have pain with touch espeically with LLE. Toilet Transfer: Min guard;Ambulation;Rolling walker (2 wheels) Toilet Transfer Details (indicate cue type and reason): patient needed min guard sitting on low commode in bathroom to stand up with use of grab bar. patient was educated on using commode during the day and that nurse can add 3 in 1 commode over toilet to raise height. patient and daughters in room verbalized understanding. Toileting- Water quality scientist and Hygiene: Min guard;Sit to/from stand       Functional mobility during ADLs: Min guard;Rolling walker (2 wheels)       Vision Patient Visual Report: No change from baseline  Pertinent Vitals/Pain Pain Assessment Pain Assessment:  No/denies pain     Hand Dominance Right   Extremity/Trunk Assessment Upper Extremity Assessment Upper Extremity Assessment: Overall WFL for tasks assessed   Lower Extremity Assessment Lower Extremity Assessment: Defer to PT evaluation   Cervical / Trunk Assessment Cervical / Trunk Assessment: Kyphotic   Communication Communication Communication: No difficulties   Cognition Arousal/Alertness: Awake/alert Behavior During Therapy: WFL for tasks assessed/performed Overall Cognitive Status: Within Functional Limits for tasks assessed         General Comments: two daughters present in room as well. noted to have a few moments of HOH                Home Living Family/patient expects to be discharged to:: Private residence Living Arrangements: Children Available Help at Discharge: Family;Available 24 hours/day (able to offer any assistance as needed) Type of Home: House Home Access: Stairs to enter [employer] of Steps: 3 Entrance Stairs-Rails: Right Home Layout: One level         Bathroom Toilet: Standard     Home Equipment: Conservation officer, nature (2 wheels);Rollator (4 wheels);Cane - single point;Grab bars - tub/shower          Prior Functioning/Environment Prior Level of Function : Independent/Modified Independent             Mobility Comments: was furniture walking at home and using cane in community          OT Problem List: Decreased activity tolerance;Decreased safety awareness;Impaired balance (sitting and/or standing);Cardiopulmonary status limiting activity      OT Treatment/Interventions: Self-care/ADL training;Energy conservation;Therapeutic exercise;DME and/or AE instruction;Therapeutic activities;Patient/family education;Balance training    OT Goals(Current goals can be found in the care plan section) Acute Rehab OT Goals Patient Stated Goal: to get home OT Goal Formulation: With patient/family Time For Goal Achievement:  05/03/22 Potential to Achieve Goals: Fair  OT Frequency: Min 2X/week       AM-PAC OT "6 Clicks" Daily Activity     Outcome Measure Help from another person eating meals?: None Help from another person taking care of personal grooming?: A Little Help from another person toileting, which includes using toliet, bedpan, or urinal?: A Lot Help from another person bathing (including washing, rinsing, drying)?: A Little Help from another person to put on and taking off regular upper body clothing?: A Little Help from another person to put on and taking off regular lower body clothing?: A Little 6 Click Score: 18   End of Session Equipment Utilized During Treatment: Rolling walker (2 wheels) Nurse Communication: Other (comment) (ok to participate in session)  Activity Tolerance: Patient tolerated treatment well Patient left: in chair;with call bell/phone within reach;with family/visitor present  OT Visit Diagnosis: Unsteadiness on feet (R26.81);Other abnormalities of gait and mobility (R26.89)                Time: 1202-1232 OT Time Calculation (min): 30 min Charges:  OT General Charges $OT Visit: 1 Visit OT Evaluation $OT Eval Low Complexity: 1 Low OT Treatments $Self Care/Home Management : 8-22 mins  Rennie Plowman, MS Acute Rehabilitation Department Office# [phone]   Willa Rough 04/19/2022, 12:59 PM

## 2022-04-20 DIAGNOSIS — I509 Heart failure, unspecified: Secondary | ICD-10-CM

## 2022-04-20 DIAGNOSIS — L8990 Pressure ulcer of unspecified site, unspecified stage: Secondary | ICD-10-CM | POA: Diagnosis present

## 2022-04-20 DIAGNOSIS — I5043 Acute on chronic combined systolic (congestive) and diastolic (congestive) heart failure: Secondary | ICD-10-CM | POA: Diagnosis present

## 2022-04-20 LAB — CBC
HCT: 42.8 % (ref 36.0–46.0)
Hemoglobin: 14.3 g/dL (ref 12.0–15.0)
MCH: 34.8 pg — ABNORMAL HIGH (ref 26.0–34.0)
MCHC: 33.4 g/dL (ref 30.0–36.0)
MCV: 104.1 fL — ABNORMAL HIGH (ref 80.0–100.0)
Platelets: 100 10*3/uL — ABNORMAL LOW (ref 150–400)
RBC: 4.11 MIL/uL (ref 3.87–5.11)
RDW: 15.3 % (ref 11.5–15.5)
WBC: 5.7 10*3/uL (ref 4.0–10.5)
nRBC: 0 % (ref 0.0–0.2)

## 2022-04-20 LAB — BASIC METABOLIC PANEL
Anion gap: 7 (ref 5–15)
BUN: 13 mg/dL (ref 8–23)
CO2: 31 mmol/L (ref 22–32)
Calcium: 8.8 mg/dL — ABNORMAL LOW (ref 8.9–10.3)
Chloride: 99 mmol/L (ref 98–111)
Creatinine, Ser: 0.81 mg/dL (ref 0.44–1.00)
GFR, Estimated: >60 mL/min (ref >60)
Glucose, Bld: 118 mg/dL — ABNORMAL HIGH (ref 70–99)
Potassium: 3.4 mmol/L — ABNORMAL LOW (ref 3.5–5.1)
Sodium: 137 mmol/L (ref 135–145)

## 2022-04-20 LAB — MAGNESIUM: Magnesium: 2 mg/dL (ref 1.7–2.4)

## 2022-04-20 LAB — BRAIN NATRIURETIC PEPTIDE: B Natriuretic Peptide: 2061.5 pg/mL — ABNORMAL HIGH (ref 0.0–100.0)

## 2022-04-20 MED ORDER — FUROSEMIDE 10 MG/ML IJ SOLN: 80.0000 mg | Freq: Once | INTRAMUSCULAR | Status: DC

## 2022-04-20 MED ORDER — ENOXAPARIN SODIUM 30 MG/0.3ML IJ SOSY
30.0000 mg | PREFILLED_SYRINGE | INTRAMUSCULAR | Status: DC
  Administered 2022-04-20 – 2022-04-21 (×2): 30 mg via SUBCUTANEOUS
  Filled 2022-04-20 (×2): qty 0.3

## 2022-04-20 MED ORDER — METOPROLOL SUCCINATE ER 25 MG PO TB24: 25.0000 mg | ORAL_TABLET | Freq: Two times a day (BID) | ORAL | Status: DC

## 2022-04-20 MED ORDER — FUROSEMIDE 10 MG/ML IJ SOLN: 80.0000 mg | Freq: Two times a day (BID) | INTRAMUSCULAR | Status: DC

## 2022-04-20 MED ORDER — POTASSIUM CHLORIDE CRYS ER 20 MEQ PO TBCR
40.0000 meq | EXTENDED_RELEASE_TABLET | ORAL | Status: AC
  Administered 2022-04-20 (×2): 40 meq via ORAL
  Filled 2022-04-20 (×2): qty 2

## 2022-04-20 MED ORDER — FUROSEMIDE 10 MG/ML IJ SOLN
60.0000 mg | Freq: Once | INTRAMUSCULAR | Status: AC
  Administered 2022-04-20: 60 mg via INTRAVENOUS
  Filled 2022-04-20: qty 6

## 2022-04-20 MED ORDER — METOPROLOL SUCCINATE ER 25 MG PO TB24
25.0000 mg | ORAL_TABLET | Freq: Every day | ORAL | Status: DC
  Administered 2022-04-20 – 2022-04-21 (×2): 25 mg via ORAL
  Filled 2022-04-20 (×2): qty 1

## 2022-04-20 NOTE — Progress Notes (Signed)
[facility] Progress Note Patient: [name] ZWC:[id number] DOB: [birth year] DOA: 04/18/2022  DOS: the patient was seen and examined on 04/20/2022  Brief hospital course: PMH of combined CHF, HTN, PAF, HTN, HLD, present to the hospital with complaints of nausea and vomiting and was actually found to have acute on chronic combined systolic and diastolic CHF.  Currently receiving IV diuresis with improvement in symptoms.  Cardiology consulted. Assessment and Plan: Acute on chronic systolic and diastolic combined CHF. EF 20-25%. Currently receiving IV diuresis. Cardiology consulted.  Most likely switching to p.o. Lasix tomorrow and possibly home? Management per cardiology.  Switching Coreg to Toprol-XL. Monitor.   Intractable nausea and vomiting.  Now resolved. Likely in the setting of body wall edema. Continue symptomatic relief. Continue stool softeners.   Chronic venous stasis dermatitis. No evidence of cellulitis. Will monitor.   Recent fall. CT head unremarkable. PT OT consult.  Recommends home health OT and PT.   Macrocytosis. B12 stable.  TSH stable.  Monitor.   Paroxysmal A-fib On rate control medication.  Not on any anticoagulation.  Switching Coreg to Toprol-XL.   HTN. Blood pressure stable. Monitor.   Underweight Body mass index is 14.89 kg/m. Placing the patient at high risk for poor outcome.   Subjective: No nausea no vomiting no fever no chills.  Continues to have shortness of breath although improving.  Swelling in the leg still present.  Physical Exam: General: in Mild distress, No Rash Cardiovascular: S1 and S2 Present, No Murmur Respiratory: Good respiratory effort, Bilateral Air entry present. No Crackles, No wheezes Abdomen: Bowel Sound present, No tenderness Extremities: No edema Neuro: Alert and oriented x3, no new focal deficit  Data Reviewed: I have Reviewed nursing notes, Vitals, and Lab results. Since last encounter, pertinent lab  results CBC and BMP   . I have ordered test including CBC and BMP  .   Disposition: Status is: Inpatient Remains inpatient appropriate because: Monitor renal function.  Need for further diuresis.  enoxaparin (LOVENOX) injection 30 mg Start: 04/20/22 1000   Family Communication: Daughter is at bedside Level of care: Telemetry Continue telemetry for CHF. Vitals:   04/20/22 0433 04/20/22 0634 04/20/22 0954 04/20/22 1344  BP:  106/65 125/68 110/62  Pulse:   86 86  Resp:    16  Temp:    97.8 F (36.6 C)  TempSrc:    Oral  SpO2:    100%  Weight: 40.6 kg     Height:         Author: Berle Mull, MD 04/20/2022 2:02 PM  Please look on [email or web address] to find out who is on call.

## 2022-04-20 NOTE — Progress Notes (Addendum)
Rounding Note    Patient Name: [name] Date of Encounter: 04/20/2022  [name]  [name]  Subjective   Breatihng is better   Still feels not back to best   Ankles have improved (better than they have been in a year")  Inpatient Medications    Scheduled Meds:  B-complex with vitamin C  1 tablet Oral Daily   furosemide  40 mg Intravenous BID   heparin  5,000 Units Subcutaneous Q8H   mirtazapine  15 mg Oral QHS   Continuous Infusions:  PRN Meds: acetaminophen, bisacodyl, guaiFENesin, ipratropium-albuterol, ondansetron (ZOFRAN) IV, senna-docusate, traZODone   Vital Signs    Vitals:   04/19/22 1413 04/19/22 2120 04/20/22 0430 04/20/22 0433  BP: 116/62 106/65 (!) 104/59   Pulse: 84 86 80   Resp: 16 16 (!) 22   Temp:  98.1 F (36.7 C) 97.7 F (36.5 C)   TempSrc:  Oral Oral   SpO2: 100% 97% 95%   Weight:    40.6 kg  Height:        Intake/Output Summary (Last 24 hours) at 04/20/2022 0551 Last data filed at 04/19/2022 2130 Gross per 24 hour  Intake 480 ml  Output --  Net 480 ml      04/20/2022    4:33 AM 04/19/2022    5:00 AM 04/18/2022    5:00 PM  Last 3 Weights  Weight (lbs) 89 lb 8 oz 92 lb 6 oz 95 lb 3.8 oz  Weight (kg) 40.597 kg 41.9 kg 43.2 kg      Telemetry    SR  Paced   2 NSVT 8 beats longest - Personally Reviewed  ECG   No new  - Personally Reviewed  Physical Exam   GEN: Thin 89 yo in  No acute distress.   Neck: No JVD Cardiac: RRR,   II-III/VI systolic murmur apex   Respiratory: Clear to auscultation bilaterally. GI: Soft, nontender, non-distended  MS: Tr LE edema; No deformity. Neuro:  Nonfocal  Psych: Normal affect   Labs    High Sensitivity Troponin:   Recent Labs  Lab 04/18/22 1025 04/18/22 1100 04/18/22 1600 04/18/22 1624  TROPONINIHS '15 14 13 16     '$ Chemistry Recent Labs  Lab 04/18/22 1025 04/18/22 1031 04/18/22 1100 04/18/22 1350 04/19/22 0448  NA 136 139  --   --  139  K 3.0* 3.0*  --    --  4.8  CL 98 95*  --   --  103  CO2 29  --   --   --  28  GLUCOSE 155* 158*  --   --  133*  BUN 10 8  --   --  11  CREATININE 0.76 0.70 0.75  --  0.70  CALCIUM 8.8*  --   --   --  9.2  MG  --   --   --  1.6* 1.9  PROT 6.8  --   --   --   --   ALBUMIN 3.7  --   --   --   --   AST 28  --   --   --   --   ALT 17  --   --   --   --   ALKPHOS 61  --   --   --   --   BILITOT 1.5*  --   --   --   --   GFRNONAA >60  --  >60  --  >  60  ANIONGAP 9  --   --   --  8    Lipids No results for input(s): "CHOL", "TRIG", "HDL", "LABVLDL", "LDLCALC", "CHOLHDL" in the last 168 hours.  Hematology Recent Labs  Lab 04/18/22 1025 04/18/22 1031 04/18/22 1624 04/19/22 0448  WBC 11.3*  --  10.0 6.8  RBC 4.38  --  4.21 4.00  HGB 15.1* 16.7* 14.7 13.7  HCT 45.7 49.0* 43.5 41.8  MCV 104.3*  --  103.3* 104.5*  MCH 34.5*  --  34.9* 34.3*  MCHC 33.0  --  33.8 32.8  RDW 15.5  --  15.6* 15.7*  PLT 108*  --  106* 103*   Thyroid  Recent Labs  Lab 04/19/22 0448  TSH 2.224    BNP Recent Labs  Lab 04/18/22 1025  BNP 3,630.7*    DDimer No results for input(s): "DDIMER" in the last 168 hours.   Radiology    ECHOCARDIOGRAM COMPLETE  Result Date: 04/18/2022    ECHOCARDIOGRAM REPORT   Patient Name:   [name] Date of Exam: 04/18/2022 Medical Rec #:  [id number]    Height:       60.0 in Accession #:    [id number]   Weight:       97.2 lb Date of Birth:  12/22/1929   BSA:          1.374 m Patient Age:    18 years     BP:           114/59 mmHg Patient Gender: F            HR:           91 bpm. Exam Location:  [place] [facility] Procedure: 2D Echo, Color Doppler and Cardiac Doppler Indications:    Cardiomyopathy-Unspecified I42.9  History:        Patient has prior history of Echocardiogram examinations, most                 recent 04/03/2015. Cardiomyopathy and CHF, Pacemaker,                 Arrythmias:Atrial Fibrillation; Risk Factors:Dyslipidemia,                 Hypertension and Former Smoker.  Sonographer:     Greer Pickerel Referring Phys: [id number] [facility]  Sonographer Comments: Image acquisition challenging due to patient body habitus. IMPRESSIONS  1. Left ventricular ejection fraction, by estimation, is 20 to 25%. The left ventricle has severely decreased function. The left ventricle demonstrates global hypokinesis. The left ventricular internal cavity size was moderately dilated. Left ventricular diastolic parameters are consistent with Grade II diastolic dysfunction (pseudonormalization). Elevated left atrial pressure.  2. Right ventricular systolic function is mildly reduced. The right ventricular size is normal. There is moderately elevated pulmonary artery systolic pressure. The estimated right ventricular systolic pressure is 09.4 mmHg.  3. Left atrial size was severely dilated.  4. Right atrial size was severely dilated.  5. A small pericardial effusion is present.  6. The mitral valve is abnormal. Severe mitral valve regurgitation. Appears functional. No evidence of mitral stenosis.  7. Tricuspid valve regurgitation is moderate.  8. The aortic valve was not well visualized. Aortic valve regurgitation is mild. No aortic stenosis is present.  9. The inferior vena cava is dilated in size with <50% respiratory variability, suggesting right atrial pressure of 15 mmHg. FINDINGS  Left Ventricle: Left ventricular ejection fraction, by estimation, is 20 to 25%. The left  ventricle has severely decreased function. The left ventricle demonstrates global hypokinesis. The left ventricular internal cavity size was moderately dilated. There is no left ventricular hypertrophy. Left ventricular diastolic parameters are consistent with Grade II diastolic dysfunction (pseudonormalization). Elevated left atrial pressure. Right Ventricle: The right ventricular size is normal. Right vetricular wall thickness was not well visualized. Right ventricular systolic function is mildly reduced. There is moderately elevated pulmonary  artery systolic pressure. The tricuspid regurgitant velocity is 3.04 m/s, and with an assumed right atrial pressure of 15 mmHg, the estimated right ventricular systolic pressure is 17.4 mmHg. Left Atrium: Left atrial size was severely dilated. Right Atrium: Right atrial size was severely dilated. Pericardium: A small pericardial effusion is present. Mitral Valve: The mitral valve is abnormal. Severe mitral valve regurgitation. No evidence of mitral valve stenosis. MV peak gradient, 5.4 mmHg. The mean mitral valve gradient is 2.0 mmHg. Tricuspid Valve: The tricuspid valve is normal in structure. Tricuspid valve regurgitation is moderate. Aortic Valve: The aortic valve was not well visualized. Aortic valve regurgitation is mild. Aortic regurgitation PHT measures 279 msec. No aortic stenosis is present. Pulmonic Valve: The pulmonic valve was grossly normal. Pulmonic valve regurgitation is mild. Aorta: The aortic root and ascending aorta are structurally normal, with no evidence of dilitation. Venous: The inferior vena cava is dilated in size with less than 50% respiratory variability, suggesting right atrial pressure of 15 mmHg. IAS/Shunts: The interatrial septum was not well visualized.  LEFT VENTRICLE PLAX 2D LVIDd:         5.15 cm   Diastology LVIDs:         4.60 cm   LV e' medial:    3.02 cm/s LV PW:         1.20 cm   LV E/e' medial:  46.0 LV IVS:        0.70 cm   LV e' lateral:   4.19 cm/s LVOT diam:     1.80 cm   LV E/e' lateral: 33.2 LV SV:         31 LV SV Index:   22 LVOT Area:     2.54 cm  RIGHT VENTRICLE RV S prime:     8.02 cm/s TAPSE (M-mode): 1.0 cm LEFT ATRIUM              Index        RIGHT ATRIUM           Index LA diam:        4.60 cm  3.35 cm/m   RA Area:     19.10 cm LA Vol (A2C):   122.0 ml 88.81 ml/m  RA Volume:   73.60 ml  53.58 ml/m LA Vol (A4C):   66.2 ml  48.19 ml/m LA Biplane Vol: 97.2 ml  70.76 ml/m  AORTIC VALVE             PULMONIC VALVE LVOT Vmax:   77.70 cm/s  PR End Diast Vel:  12.18 msec LVOT Vmean:  48.100 cm/s LVOT VTI:    0.120 m AI PHT:      279 msec  AORTA Ao Root diam: 3.50 cm Ao Asc diam:  3.20 cm MITRAL VALVE                TRICUSPID VALVE MV Area (PHT): 3.81 cm     TR Peak grad:   37.0 mmHg MV Area VTI:   0.79 cm     TR Vmax:  304.00 cm/s MV Peak grad:  5.4 mmHg MV Mean grad:  2.0 mmHg     SHUNTS MV Vmax:       1.16 m/s     Systemic VTI:  0.12 m MV Vmean:      63.5 cm/s    Systemic Diam: 1.80 cm MV Decel Time: 199 msec MR Peak grad: 106.5 mmHg MR Vmax:      516.00 cm/s MV E velocity: 139.00 cm/s MV A velocity: 101.00 cm/s MV E/A ratio:  1.38 Oswaldo Milian MD Electronically signed by Oswaldo Milian MD Signature Date/Time: 04/18/2022/4:58:38 PM    Final    CT Angio Chest/Abd/Pel for Dissection W and/or Wo Contrast  Result Date: 04/18/2022 CLINICAL DATA:  Aortic acute syndrome. Pain. Nausea since 7 p.m. last night. Abdominal discomfort EXAM: CT ANGIOGRAPHY CHEST, ABDOMEN AND PELVIS TECHNIQUE: Non-contrast CT of the chest was initially obtained. Multidetector CT imaging through the chest, abdomen and pelvis was performed using the standard protocol during bolus administration of intravenous contrast. Multiplanar reconstructed images and MIPs were obtained and reviewed to evaluate the vascular anatomy. RADIATION DOSE REDUCTION: This exam was performed according to the departmental dose-optimization program which includes automated exposure control, adjustment of the mA and/or kV according to patient size and/or use of iterative reconstruction technique. CONTRAST:  65m OMNIPAQUE IOHEXOL 350 MG/ML SOLN COMPARISON:  CT 2010 FINDINGS: CTA CHEST FINDINGS Cardiovascular: The heart is enlarged. Particularly involving the atria. There is a left chest battery pack identified with defibrillator lead extending along the heart. No significant pericardial effusion. The battery pack and motion does cause extensive artifacts as well as the arms being scanned at the patient's  side. The thoracic aorta has scattered atherosclerotic calcified plaque. No dissection clearly seen but there is a large number of artifacts across the thorax. A subtle abnormalities difficult to completely exclude. Diameter of the ascending aorta at the level of the right pulmonary artery measures 3.3 by 3.2 cm. The descending thoracic aorta 2.2 x 2.4 cm. The aortic arch has a diameter approaching 2.7 cm. A bovine type aortic arch is seen with scattered calcified plaque. There is significant artifact obscuring the left subclavian artery. Patency is difficult to determine its origin. Coronary artery calcifications are seen. Of note on the noncontrast dataset no definite mural hematoma along the aorta. Mediastinum/Nodes: No specific abnormal lymph node enlargement identified in the axillary region, hilum or mediastinum. Normal caliber thoracic esophagus. Lungs/Pleura: Advanced emphysematous lung changes. Apical pleural thickening identified. Slightly nodular opacity identified in the middle lobe measuring 10 by 7 mm on series 4, image 107. No pneumothorax. Tiny bilateral dependent pleural effusions. Musculoskeletal: Diffuse degenerative changes along the spine with some kyphosis. Osteopenia. Review of the MIP images confirms the above findings. CTA ABDOMEN AND PELVIS FINDINGS VASCULAR Aorta: Normal caliber abdominal aorta with diffuse moderate calcified atherosclerotic plaque. No dissection or aneurysm formation. Celiac: There is significant calcified plaque at the origin of the celiac with a stenosis of close to 50%. SMA: Mild plaque at the origin and proximal portion vessel without significant stenosis. Renals: There is some mild plaque noted along the proximal aspects of both renal arteries. No significant stenosis on the left. Mild on the right, less than 50%. IMA: Grossly patent. Inflow: There is ectasia of the left common iliac artery measuring up to 11 mm. Scattered partially calcified plaque along the iliac  vessels diffusely bilaterally. There is some noncalcified plaque as well along the external iliac vessels with some mild stenosis. Veins: Grossly caliber IVC.  Contrast is in the arterial phase. Review of the MIP images confirms the above findings. NON-VASCULAR Hepatobiliary: With the limits of the arterial phase of the contrast bolus, grossly the liver is unremarkable. No obvious mass lesion. There is some stone seen in the dependent portion of the nondilated gallbladder. Pancreas: Moderate atrophy Spleen: . nonenlarged. Adrenals/Urinary Tract: Adrenal glands are preserved. Mild bilateral renal atrophy. Small lower pole right-sided renal cyst. Maximum diameter of 2.3 cm. Hounsfield unit of 12. Similar upper pole left-sided focus with Hounsfield units of 4 and diameter of 17 mm. Bosniak 1 lesions. No specific imaging follow-up recommended. No collecting system dilatation. Preserved contours of the urinary bladder. Stomach/Bowel: On this non oral contrast exam, the large bowel has a normal course and caliber with scattered colonic stool. There is some high density material noted along loops of colon and distal ileum, nonspecific. Please correlate for ingested material. Stomach and small bowel are nondilated. Lymphatic: No specific abnormal lymph node enlargement identified in the abdomen and pelvis. Reproductive: Uterus is not seen. There is a cystic lesion in the left adnexa towards the pelvic sidewall measuring 2.2 by 1.5 cm. This could be ovarian. Hounsfield unit of 8. This was not seen in 1,010. Other: Anasarca identified. No significant ascites. Focal lipoma anterior to the left hip, unchanged from previous. There is significant streak artifact related to the patient's bilateral hip arthroplasties obscuring pelvis. There is also motion and artifact from the patient's arms obscuring the abdomen and pelvis. Musculoskeletal: Degenerative changes identified along the spine and pelvis with some areas of stenosis in  the lumbar spine. Review of the MIP images confirms the above findings. IMPRESSION: Limited examination due to multiple artifacts particularly along the thorax but also some in the abdomen and pelvis. No obvious aortic aneurysm or dissection. Again there are areas on the thorax which were very limited of the aorta and branch vessels. Diffuse atherosclerotic changes identified in the chest, abdomen and pelvis. Slight ectasia of the left common iliac artery. Multilevel mild branch vessels stenosis particularly of the celiac. Defibrillator.  Enlarged heart. Gallstones. Cystic lesion in the left adnexa, likely ovarian. This was not seen on the prior from 2010. Would recommend simple follow-up ultrasound in 3 months to assess stability. Tiny pleural effusions.  Emphysematous lung changes. Electronically Signed   By: Jill Side M.D.   On: 04/18/2022 12:18   CT Head Wo Contrast  Result Date: 04/18/2022 CLINICAL DATA:  Trauma.  Vomiting. EXAM: CT HEAD WITHOUT CONTRAST TECHNIQUE: Contiguous axial images were obtained from the base of the skull through the vertex without intravenous contrast. RADIATION DOSE REDUCTION: This exam was performed according to the departmental dose-optimization program which includes automated exposure control, adjustment of the mA and/or kV according to patient size and/or use of iterative reconstruction technique. COMPARISON:  12/19/2013 FINDINGS: Brain: There is no evidence for acute hemorrhage, hydrocephalus, mass lesion, or abnormal extra-axial fluid collection. No definite CT evidence for acute infarction. Diffuse loss of parenchymal volume is consistent with atrophy. Patchy low attenuation in the deep hemispheric and periventricular white matter is nonspecific, but likely reflects chronic microvascular ischemic demyelination. Vascular: No hyperdense vessel or unexpected calcification. Skull: No evidence for fracture. No worrisome lytic or sclerotic lesion. Sinuses/Orbits: The  visualized paranasal sinuses and mastoid air cells are clear. Visualized portions of the globes and intraorbital fat are unremarkable. Other: None. IMPRESSION: 1. No acute intracranial abnormality. 2. Atrophy with chronic small vessel ischemic disease. Electronically Signed   By: Verda Cumins.D.  On: 04/18/2022 10:13   DG Chest Port 1 View  Addendum Date: 04/18/2022   ADDENDUM REPORT: 04/18/2022 10:00 ADDENDUM: Given chest discomfort, CTA of the chest, abdomen and pelvis was recommended to evaluate for aortic dissection versus aneurysm. Critical Value/emergent results were called by telephone at the time of interpretation on 04/18/2022 at 947 am to provider [facility] , who verbally acknowledged these results. Electronically Signed   By: Emmit Alexanders M.D.   On: 04/18/2022 10:00   Result Date: 04/18/2022 CLINICAL DATA:  Dyspnea, nausea and vomiting. EXAM: PORTABLE CHEST 1 VIEW COMPARISON:  Chest radiographs 11/07/2018. FINDINGS: Left chest AICD/pacemaker with leads projecting over the right atrium, right ventricle and coronary sinus. Fourth lead projecting over the right atrium appears to be fractured as it crosses the right atrial pacing lead and right ventricular defibrillator lead. Moderately increased heart size with considerable expansion of the aortic arch calcifications since August 2020. Low lung volumes with probable mild pulmonary edema. No pleural effusion or pneumothorax. IMPRESSION: 1. Considerable expansion of the aortic arch calcifications since August 2020. In the presence of moderately increased heart size, findings are concerning for acute aortic syndrome. Thoracic aortic aneurysm is a differential consideration. 2. Probable mild pulmonary edema. 3. Possibly fractured right heart lead from left chest AICD/pacemaker. Electronically Signed: By: Emmit Alexanders M.D. On: 04/18/2022 09:46   DG Abdomen 1 View  Result Date: 04/18/2022 CLINICAL DATA:  Dyspnea, nausea and vomiting. Abdominal  discomfort all across her abdomen. EXAM: ABDOMEN - 1 VIEW COMPARISON:  Pelvis radiograph 09/04/2014. FINDINGS: The bowel gas pattern is normal. No radio-opaque calculi or other significant radiographic abnormality are seen. Unchanged appearance of bilateral hip hemiarthroplasties. IMPRESSION: Negative. Electronically Signed   By: Emmit Alexanders M.D.   On: 04/18/2022 09:38    Cardiac Studies  Echo 04/18/21  1. Left ventricular ejection fraction, by estimation, is 20 to 25%. The  left ventricle has severely decreased function. The left ventricle  demonstrates global hypokinesis. The left ventricular internal cavity size  was moderately dilated. Left  ventricular diastolic parameters are consistent with Grade II diastolic  dysfunction (pseudonormalization). Elevated left atrial pressure.   2. Right ventricular systolic function is mildly reduced. The right  ventricular size is normal. There is moderately elevated pulmonary artery  systolic pressure. The estimated right ventricular systolic pressure is  50.2 mmHg.   3. Left atrial size was severely dilated.   4. Right atrial size was severely dilated.   5. A small pericardial effusion is present.   6. The mitral valve is abnormal. Severe mitral valve regurgitation.  Appears functional. No evidence of mitral stenosis.   7. Tricuspid valve regurgitation is moderate.   8. The aortic valve was not well visualized. Aortic valve regurgitation  is mild. No aortic stenosis is present.   9. The inferior vena cava is dilated in size with <50% respiratory  variability, suggesting right atrial pressure of 15 mmHg.   Patient Profile     89 y.o. female with hx of NICM (s/p biV ICD then CRTP), HTN, HL, PAF.  Covid in Dec 2023   Presented to ER on 1/19 with SOB, increaed LE edema, chest pressure.     Assessment & Plan    1  Acute on chronic HFrEF.    Pt's wt has gone done since admit  CLinically she is improved   She says still not at best  Echo shows  LVEF 20 to 25%    I would recomm 60 mg IV this  AM  Follow response  REcheck  BNP  Note she is off of carvedilol 6.25    I would switch to Toprol XL given BP   Start 25 mg    Was not on other agents    WIll need close follow up   Do not want her dizzy and falling      2.  HTN  BP 100s/  Low dose Toprol for HFrEF  3  PAF  REmains in SR   Paced   Close to d/c    For questions or updates, please contact [facility] Please consult [email or web address] for contact info under        Signed, Dorris Carnes, MD  04/20/2022, 5:51 AM

## 2022-04-20 NOTE — Evaluation (Signed)
Physical Therapy Evaluation Patient Details Name: [name] MRN: [id number] DOB: Jun 05, 1929 Today's Date: 04/20/2022  History of Present Illness  Patient is a 89 year old female who presented with nausea, vomiting and SOB with recent COVID 19. Patient was admitted with exacerbation of CHF, L lower extremity cellulitis hypocalcemia, hypokalemia, hypomagnesemia. PMH: systolic CHF, a fib, HTN.  Clinical Impression  Pt admitted as above and presenting with functional mobility limitations 2* generalized weakness, decreased endurance and ambulatory balance deficits.  Pt very motivated and should progress to dc home with family assist.  Pt would benefit from follow up HHPT to further address deficits and assist pt's return to baseline level of function.     Recommendations for follow up therapy are one component of a multi-disciplinary discharge planning process, led by the attending physician.  Recommendations may be updated based on patient status, additional functional criteria and insurance authorization.  Follow Up Recommendations Home health PT      Assistance Recommended at Discharge Intermittent Supervision/Assistance  Patient can return home with the following  A little help with walking and/or transfers;A little help with bathing/dressing/bathroom;Assistance with cooking/housework;Assist for transportation;Help with stairs or ramp for entrance    Equipment Recommendations None recommended by PT  Recommendations for Other Services       Functional Status Assessment Patient has had a recent decline in their functional status and demonstrates the ability to make significant improvements in function in a reasonable and predictable amount of time.     Precautions / Restrictions Precautions Precautions: Fall Restrictions Weight Bearing Restrictions: No      Mobility  Bed Mobility Overal bed mobility: Needs Assistance Bed Mobility: Supine to Sit     Supine to sit:  Supervision, HOB elevated          Transfers Overall transfer level: Needs assistance Equipment used: Rolling walker (2 wheels) Transfers: Sit to/from Stand Sit to Stand: Min guard, Min assist           General transfer comment: cues for use of UEs to self assist; min guard to stand from elevated bed but min assist to stand from commode    Ambulation/Gait Ambulation/Gait assistance: Min assist, Min guard Gait Distance (Feet): 125 Feet (125 ' twice with seated rest break 2* fatigue; 15' into bathroom) Assistive device: Rolling walker (2 wheels) Gait Pattern/deviations: Step-through pattern, Decreased step length - right, Decreased step length - left, Shuffle, Trunk flexed       General Gait Details: cues for posture and position from [employer]            Wheelchair Mobility    Modified Rankin (Stroke Patients Only)       Balance Overall balance assessment: Needs assistance Sitting-balance support: No upper extremity supported, Feet supported Sitting balance-Leahy Scale: Good     Standing balance support: No upper extremity supported Standing balance-Leahy Scale: Fair                               Pertinent Vitals/Pain Pain Assessment Pain Assessment: No/denies pain    Home Living Family/patient expects to be discharged to:: Private residence Living Arrangements: Children Available Help at Discharge: Family;Available 24 hours/day Type of Home: House Home Access: Stairs to enter Entrance Stairs-Rails: Right Entrance Stairs-Number of Steps: 3   Home Layout: One level Home Equipment: Conservation officer, nature (2 wheels);Rollator (4 wheels);Cane - single point;Grab bars - tub/shower      Prior Function Prior  Level of Function : Independent/Modified Independent             Mobility Comments: was furniture walking at home and using cane in community       Hand Dominance   Dominant Hand: Right    Extremity/Trunk Assessment   Upper  Extremity Assessment Upper Extremity Assessment: Overall WFL for tasks assessed    Lower Extremity Assessment Lower Extremity Assessment: Generalized weakness    Cervical / Trunk Assessment Cervical / Trunk Assessment: Kyphotic  Communication   Communication: No difficulties  Cognition Arousal/Alertness: Awake/alert Behavior During Therapy: WFL for tasks assessed/performed Overall Cognitive Status: Within Functional Limits for tasks assessed                                          General Comments      Exercises     Assessment/Plan    PT Assessment Patient needs continued PT services  PT Problem List Decreased strength;Decreased activity tolerance;Decreased balance;Decreased mobility;Decreased knowledge of use of DME       PT Treatment Interventions DME instruction;Gait training;Stair training;Functional mobility training;Therapeutic activities;Therapeutic exercise;Patient/family education    PT Goals (Current goals can be found in the Care Plan section)  Acute Rehab PT Goals Patient Stated Goal: REgain IND PT Goal Formulation: With patient Time For Goal Achievement: 05/04/22 Potential to Achieve Goals: Good    Frequency Min 3X/week     Co-evaluation               AM-PAC PT "6 Clicks" Mobility  Outcome Measure Help needed turning from your back to your side while in a flat bed without using bedrails?: None Help needed moving from lying on your back to sitting on the side of a flat bed without using bedrails?: None Help needed moving to and from a bed to a chair (including a wheelchair)?: A Little Help needed standing up from a chair using your arms (e.g., wheelchair or bedside chair)?: A Little Help needed to walk in hospital room?: A Little Help needed climbing 3-5 steps with a railing? : A Lot 6 Click Score: 19    End of Session Equipment Utilized During Treatment: Gait belt Activity Tolerance: Patient tolerated treatment  well;Patient limited by fatigue Patient left: in chair;with call bell/phone within reach;with chair alarm set Nurse Communication: Mobility status PT Visit Diagnosis: Difficulty in walking, not elsewhere classified (R26.2);Muscle weakness (generalized) (M62.81)    Time: 3382-5053 PT Time Calculation (min) (ACUTE ONLY): 32 min   Charges:   PT Evaluation $PT Eval Low Complexity: 1 Low PT Treatments $Gait Training: 8-22 mins        Debe Coder PT Acute Rehabilitation Services Pager [phone] Office [phone]   Johnie Makki 04/20/2022, 11:50 AM

## 2022-04-21 DIAGNOSIS — I5043 Acute on chronic combined systolic (congestive) and diastolic (congestive) heart failure: Secondary | ICD-10-CM | POA: Diagnosis not present

## 2022-04-21 LAB — BASIC METABOLIC PANEL
Anion gap: 7 (ref 5–15)
BUN: 15 mg/dL (ref 8–23)
CO2: 25 mmol/L (ref 22–32)
Calcium: 8.9 mg/dL (ref 8.9–10.3)
Chloride: 103 mmol/L (ref 98–111)
Creatinine, Ser: 0.82 mg/dL (ref 0.44–1.00)
GFR, Estimated: >60 mL/min (ref >60)
Glucose, Bld: 154 mg/dL — ABNORMAL HIGH (ref 70–99)
Potassium: 4.6 mmol/L (ref 3.5–5.1)
Sodium: 135 mmol/L (ref 135–145)

## 2022-04-21 LAB — MAGNESIUM: Magnesium: 2.3 mg/dL (ref 1.7–2.4)

## 2022-04-21 MED ORDER — DICLOFENAC SODIUM 1 % EX GEL: 2.0000 g | Freq: Two times a day (BID) | CUTANEOUS | 0 refills | Status: DC | PRN

## 2022-04-21 MED ORDER — METOPROLOL SUCCINATE ER 25 MG PO TB24: 25.0000 mg | ORAL_TABLET | Freq: Every day | ORAL | 0 refills | Status: DC

## 2022-04-21 MED ORDER — TORSEMIDE 20 MG PO TABS: 20.0000 mg | ORAL_TABLET | Freq: Every day | ORAL | 0 refills | Status: DC

## 2022-04-21 MED ORDER — DICLOFENAC SODIUM 1 % EX GEL
2.0000 g | Freq: Four times a day (QID) | CUTANEOUS | Status: DC
  Administered 2022-04-21: 2 g via TOPICAL
  Filled 2022-04-21: qty 100

## 2022-04-21 MED ORDER — ACETAMINOPHEN ER 650 MG PO TBCR: 1300.0000 mg | EXTENDED_RELEASE_TABLET | Freq: Two times a day (BID) | ORAL | Status: DC | PRN

## 2022-04-21 MED ORDER — B COMPLEX-C PO TABS: 1.0000 | ORAL_TABLET | Freq: Every day | ORAL | 0 refills | Status: DC

## 2022-04-21 NOTE — Discharge Instructions (Signed)
Do the following things EVERY DAY:  Weigh yourself EVERY morning after you go to the bathroom but before you eat or drink anything. Write this number down in a weight log/diary. If you gain 3 pounds overnight or 5 pounds in a week, call the office.  Take your medicines as prescribed. If you have concerns about your medications, please call us before you stop taking them.   Eat low salt foods--Limit salt (sodium) to 2000 mg per day. This will help prevent your body from holding onto fluid. Read food labels as many processed foods have a lot of sodium, especially canned goods and prepackaged meats. If you would like some assistance choosing low sodium foods, we would be happy to set you up with a nutritionist.  Stay as active as you can everyday. Staying active will give you more energy and make your muscles stronger. Start with 5 minutes at a time and work your way up to 30 minutes a day. Break up your activities--do some in the morning and some in the afternoon. Start with 3 days per week and work your way up to 5 days as you can.  If you have chest pain, feel short of breath, dizzy, or lightheaded, STOP. If you don't feel better after a short rest, call 911. If you do feel better, call the office to let us know you have symptoms with exercise.  Limit all fluids for the day to less than 2 liters (64 oz). Fluid includes all drinks, coffee, juice, ice chips, soup, jello, and all other liquids.

## 2022-04-21 NOTE — Progress Notes (Signed)
Rounding Note    Patient Name: [name] Date of Encounter: 04/21/2022  [place] Cardiologist: Dr Caryl Comes   Subjective   She shared her life experience with ETOH addition. She states she lives with her son who has developmental disability, still does laundry and takes care of him when she can. She has 2 daughters nearby to help if needed. She does not wish any significant change of her medical therapy, felt know her body well, "not afraid of dying". She has chronic SOB, overall improved SOB and leg swelling, no nausea or vomiting now.   Inpatient Medications    Scheduled Meds:  B-complex with vitamin C  1 tablet Oral Daily   enoxaparin (LOVENOX) injection  30 mg Subcutaneous Q24H   metoprolol succinate  25 mg Oral Daily   mirtazapine  15 mg Oral QHS   Continuous Infusions:  PRN Meds: acetaminophen, bisacodyl, guaiFENesin, ipratropium-albuterol, ondansetron (ZOFRAN) IV, senna-docusate, traZODone   Vital Signs    Vitals:   04/20/22 1344 04/20/22 2036 04/21/22 0500 04/21/22 0606  BP: 110/62 113/67  119/64  Pulse: 86 94  82  Resp: '16 18  19  '$ Temp: 97.8 F (36.6 C) 98.4 F (36.9 C)  98.2 F (36.8 C)  TempSrc: Oral Oral  Oral  SpO2: 100% 99%  96%  Weight:   40.6 kg   Height:        Intake/Output Summary (Last 24 hours) at 04/21/2022 0935 Last data filed at 04/21/2022 6734 Gross per 24 hour  Intake 240 ml  Output 925 ml  Net -685 ml      04/21/2022    5:00 AM 04/20/2022    4:33 AM 04/19/2022    5:00 AM  Last 3 Weights  Weight (lbs) 89 lb 8.1 oz 89 lb 8 oz 92 lb 6 oz  Weight (kg) 40.6 kg 40.597 kg 41.9 kg      Telemetry    V paced rhythm  - Personally Reviewed  ECG    No new tracing today - Personally Reviewed  Physical Exam   GEN: No acute distress.   Neck: No JVD Cardiac: RRR, grade III diastolic murmur of apex  Respiratory: Clear to auscultation bilaterally. On room air.  GI: Soft, nontender MS: Trace edema, legs tender to touch, RLE  erythema noted, chronic venous stasis  Neuro:  Nonfocal  Psych: Normal affect   Labs    High Sensitivity Troponin:   Recent Labs  Lab 04/18/22 1025 04/18/22 1100 04/18/22 1600 04/18/22 1624  TROPONINIHS '15 14 13 16     '$ Chemistry Recent Labs  Lab 04/18/22 1025 04/18/22 1031 04/19/22 0448 04/20/22 0517 04/21/22 0415  NA 136   < > 139 137 135  K 3.0*   < > 4.8 3.4* 4.6  CL 98   < > 103 99 103  CO2 29  --  '28 31 25  '$ GLUCOSE 155*   < > 133* 118* 154*  BUN 10   < > '11 13 15  '$ CREATININE 0.76   < > 0.70 0.81 0.82  CALCIUM 8.8*  --  9.2 8.8* 8.9  MG  --    < > 1.9 2.0 2.3  PROT 6.8  --   --   --   --   ALBUMIN 3.7  --   --   --   --   AST 28  --   --   --   --   ALT 17  --   --   --   --  ALKPHOS 61  --   --   --   --   BILITOT 1.5*  --   --   --   --   GFRNONAA >60   < > >60 >60 >60  ANIONGAP 9  --  '8 7 7   '$ < > = values in this interval not displayed.    Lipids No results for input(s): "CHOL", "TRIG", "HDL", "LABVLDL", "LDLCALC", "CHOLHDL" in the last 168 hours.  Hematology Recent Labs  Lab 04/18/22 1624 04/19/22 0448 04/20/22 0517  WBC 10.0 6.8 5.7  RBC 4.21 4.00 4.11  HGB 14.7 13.7 14.3  HCT 43.5 41.8 42.8  MCV 103.3* 104.5* 104.1*  MCH 34.9* 34.3* 34.8*  MCHC 33.8 32.8 33.4  RDW 15.6* 15.7* 15.3  PLT 106* 103* 100*   Thyroid  Recent Labs  Lab 04/19/22 0448  TSH 2.224    BNP Recent Labs  Lab 04/18/22 1025 04/20/22 0517  BNP 3,630.7* 2,061.5*    DDimer No results for input(s): "DDIMER" in the last 168 hours.   Radiology    No results found.  Cardiac Studies   Echo from 04/18/22:   1. Left ventricular ejection fraction, by estimation, is 20 to 25%. The  left ventricle has severely decreased function. The left ventricle  demonstrates global hypokinesis. The left ventricular internal cavity size  was moderately dilated. Left  ventricular diastolic parameters are consistent with Grade II diastolic  dysfunction (pseudonormalization). Elevated  left atrial pressure.   2. Right ventricular systolic function is mildly reduced. The right  ventricular size is normal. There is moderately elevated pulmonary artery  systolic pressure. The estimated right ventricular systolic pressure is  14.9 mmHg.   3. Left atrial size was severely dilated.   4. Right atrial size was severely dilated.   5. A small pericardial effusion is present.   6. The mitral valve is abnormal. Severe mitral valve regurgitation.  Appears functional. No evidence of mitral stenosis.   7. Tricuspid valve regurgitation is moderate.   8. The aortic valve was not well visualized. Aortic valve regurgitation  is mild. No aortic stenosis is present.   9. The inferior vena cava is dilated in size with <50% respiratory  variability, suggesting right atrial pressure of 15 mmHg.    Echo from 04/03/15:  - Left ventricle: The cavity size was moderately dilated. Wall    thickness was normal. Systolic function was severely reduced. The    estimated ejection fraction was in the range of 25% to 30%.    Diffuse hypokinesis. Doppler parameters are consistent with    elevated ventricular end-diastolic filling pressure.  - Aortic valve: There was mild regurgitation.  - Mitral valve: There was moderate regurgitation.  - Atrial septum: No defect or patent foramen ovale was identified.    Patient Profile     89 y.o. female with PMH of non-ischemic cardiomyopathy, chronic systolic and diastolic heart failure, s/p [employer] ICD 2007 >> downgraded to CRT-P 12/ 2014 >> LV lead revision 02/2013 >> gen change 08/16/19, HTN, HLD, sinus tachycardia,  who is admitted for CHF exacerbation.   Assessment & Plan    Acute on chronic combined CHF  Non-ichemic cardiomyopathy  CRT-P in situ - presented with increased SOB, orthopnea, chest pressure, nausea, vomiting , leg edema  - CTA torso without aortic dissection, exam limited  - BNP 3630, POA - Echo with LVEF 20-25%, grade II DD, mild reduced RV,  severe LAE and RAE , severe MR  - s/p IV  Lasix, UOP no accurate, weight down 95 >>89 ibs, clinically euvolemic today, on torsemide '20mg'$  QOD at home, may discharge on torsemide '20mg'$  daily going forward, discussed when to hold diuretic with the patient  - GDMT: switched from Coreg to Metoprolol XL '25mg'$  daily to avoid hypotension/fall, BP low normal, will not add additional therapy at this time, if BP and renal function allows and patient wishes, may add ARNI/MRA/SGLT2I at follow up visit   HTN - BP low normal on beta blocker and diuretic   Sinus tachycardia  Paroxysmal A fib NSVT - per EP records review, she has a few false A fib and a few true A fib lasting seconds in the past interrogation, she is historically not anticoagulated and remains on beta blocker, no change at this time     For questions or updates, please contact [place] Please consult [email or web address] for contact info under        Signed, Margie Billet, NP  04/21/2022, 9:35 AM

## 2022-04-21 NOTE — Plan of Care (Signed)
  Problem: Education: Goal: Knowledge of General Education information will improve Description: Including pain rating scale, medication(s)/side effects and non-pharmacologic comfort measures Outcome: Progressing   Problem: Health Behavior/Discharge Planning: Goal: Ability to manage health-related needs will improve Outcome: Progressing

## 2022-04-21 NOTE — Progress Notes (Signed)
Mobility Specialist - Progress Note  Pre-mobility: 88 bpm HR During mobility: 94 bpm HR Post-mobility: 90 bpm HR   04/21/22 1007  Mobility  Activity Ambulated with assistance to bathroom;Ambulated with assistance in hallway  Level of Assistance Minimal assist, patient does 75% or more  Assistive Device Front wheel walker  Distance Ambulated (ft) 238 ft  Range of Motion/Exercises Active  Activity Response Tolerated well  Mobility Referral Yes  $Mobility charge 1 Mobility   Pt was found in bed and agreeable to ambulate. Was min-A going from lying to sitting and was standby for ambulation. Ambulated to the bathroom prior to ambulation in hallway. Had ~x5 standing rest breaks for a couple of seconds each due to feeling SOB while talking and walking. Pt was advised to focus on walking more than talking due to this. At EOS returned to bed with all necessities in reach.  Ferd Hibbs Mobility Specialist

## 2022-04-21 NOTE — Care Management Important Message (Signed)
Important Message  Patient Details IM Letter given. Name: [name] MRN: [id number] Date of Birth: [birth year]   Medicare Important Message Given:  Yes     Kerin Salen 04/21/2022, 1:18 PM

## 2022-04-21 NOTE — Progress Notes (Signed)
Message sent to staff to arrange discharge follow up with Dr Caryl Comes in 2 weeks.

## 2022-04-21 NOTE — TOC Transition Note (Signed)
Transition of Care [facility]) - CM/SW Discharge Note   Patient Details  Name: ALISSA PHARR MRN: [id number] Date of Birth: [birth year]  Transition of Care [facility]) CM/SW Contact:  Leeroy Cha, RN Phone Number: 04/21/2022, 1:24 PM   Clinical Narrative:    Pt dcd to return home.  Home health care will be done through [facility].  Information of service sent to [facility] and acceptance received.         Patient Goals and CMS Choice      Discharge Placement                         Discharge Plan and Services Additional resources added to the After Visit Summary for                                       Social Determinants of Health (SDOH) Interventions SDOH Screenings   Food Insecurity: No Food Insecurity (04/18/2022)  Housing: Low Risk  (04/18/2022)  Transportation Needs: No Transportation Needs (04/18/2022)  Utilities: Not At Risk (04/18/2022)  Depression (PHQ2-9): Low Risk  (10/24/2021)  Financial Resource Strain: Low Risk  (12/24/2017)  Physical Activity: Sufficiently Active (12/24/2017)  Social Connections: Moderately Isolated (12/24/2017)  Stress: No Stress Concern Present (12/24/2017)  Tobacco Use: Medium Risk (04/18/2022)     Readmission Risk Interventions   No data to display

## 2022-04-22 ENCOUNTER — Telehealth: Payer: Self-pay

## 2022-04-22 NOTE — Patient Outreach (Signed)
  Care Coordination TOC Note Transition Care Management Follow-up Telephone Call Date of discharge and from where: [facility] 04/21/22 How have you been since you were released from the hospital? "I am doing better, using my walker and my daughter went to get my medications". Any questions or concerns? No  Items Reviewed: Did the pt receive and understand the discharge instructions provided? Yes  Medications obtained and verified? Yes  Other? No  Any new allergies since your discharge? No  Dietary orders reviewed? No Do you have support at home? Yes   Home Care and Equipment/Supplies: Were home health services ordered? yes If so, what is the name of the agency? [facility]  Has the agency set up a time to come to the patient's home? no Were any new equipment or medical supplies ordered?  No What is the name of the medical supply agency? N/a Were you able to get the supplies/equipment? no Do you have any questions related to the use of the equipment or supplies? No  Functional Questionnaire: (I = Independent and D = Dependent) ADLs: I  Bathing/Dressing- I  Meal Prep- I  Eating- I  Maintaining continence- I  Transferring/Ambulation- I  Managing Meds- I  Follow up appointments reviewed:  PCP Hospital f/u appt confirmed? No   Specialist Hospital f/u appt confirmed? Yes  Scheduled to see Dr. Caryl Comes (cardiology) on 05/08/22 @ 1:45. Are transportation arrangements needed? No  If their condition worsens, is the pt aware to call PCP or go to the Emergency Dept.? Yes Was the patient provided with contact information for the PCP's office or ED? Yes Was to pt encouraged to call back with questions or concerns? Yes  SDOH assessments and interventions completed:   Yes SDOH Interventions Today    Flowsheet Row Most Recent Value  SDOH Interventions   Food Insecurity Interventions Intervention Not Indicated  Housing Interventions Intervention Not Indicated  Transportation  Interventions Intervention Not Indicated       Care Coordination Interventions:  PCP follow up appointment requested   Encounter Outcome:  Pt. Visit Completed

## 2022-04-23 DIAGNOSIS — L03116 Cellulitis of left lower limb: Secondary | ICD-10-CM | POA: Diagnosis not present

## 2022-04-23 DIAGNOSIS — I429 Cardiomyopathy, unspecified: Secondary | ICD-10-CM | POA: Diagnosis not present

## 2022-04-23 DIAGNOSIS — I7300 Raynaud's syndrome without gangrene: Secondary | ICD-10-CM | POA: Diagnosis not present

## 2022-04-23 DIAGNOSIS — L89150 Pressure ulcer of sacral region, unstageable: Secondary | ICD-10-CM | POA: Diagnosis not present

## 2022-04-23 DIAGNOSIS — I110 Hypertensive heart disease with heart failure: Secondary | ICD-10-CM | POA: Diagnosis not present

## 2022-04-23 DIAGNOSIS — G609 Hereditary and idiopathic neuropathy, unspecified: Secondary | ICD-10-CM | POA: Diagnosis not present

## 2022-04-23 DIAGNOSIS — M810 Age-related osteoporosis without current pathological fracture: Secondary | ICD-10-CM | POA: Diagnosis not present

## 2022-04-23 DIAGNOSIS — Z95810 Presence of automatic (implantable) cardiac defibrillator: Secondary | ICD-10-CM | POA: Diagnosis not present

## 2022-04-23 DIAGNOSIS — I480 Paroxysmal atrial fibrillation: Secondary | ICD-10-CM | POA: Diagnosis not present

## 2022-04-23 DIAGNOSIS — I5023 Acute on chronic systolic (congestive) heart failure: Secondary | ICD-10-CM | POA: Diagnosis not present

## 2022-04-24 NOTE — Discharge Summary (Signed)
Physician Discharge Summary   Patient: [name] MRN: [id number] DOB: [birth year]  Admit date:     04/18/2022  Discharge date: 04/21/2022  Discharge Physician: Berle Mull  PCP: [name], MD  Recommendations at discharge: Follow-up with PCP in 1 week. Follow-up with cardiologist recommended.   Follow-up Information     [name], MD. Schedule an appointment as soon as possible for a visit in 1 week(s).   Specialty: Family Medicine Why: Discuss options for medicine for neuropathic pain Contact information: [place] [address] [address] [facility]. Schedule an appointment as soon as possible for a visit in 2 week(s).   Specialty: Cardiology Contact information: [address] [place] [id number] [place] [id number]               Discharge Diagnoses: Principal Problem:   Acute on chronic combined systolic and diastolic CHF (congestive heart failure) (HCC) Active Problems:   Hyperlipidemia   HYPERTENSION, BENIGN SYSTEMIC   Biventricular ICD (implantable cardioverter-defibrillator)-[employer]   Nonischemic cardiomyopathy (HCC)   Peripheral neuropathy   Paroxysmal atrial fibrillation (HCC)   Pressure injury of skin  Hospital Course: PMH of combined CHF, HTN, PAF, HTN, HLD, present to the hospital with complaints of nausea and vomiting and was actually found to have acute on chronic combined systolic and diastolic CHF.  Treated with IV diuresis with improvement in symptoms.  Cardiology consulted. Assessment and Plan  Acute on chronic systolic and diastolic combined CHF. EF 20-25%. Was receiving IV diuresis. Cardiology consulted. Switching Coreg to Toprol-XL.  Will be on torsemide on discharge.   Intractable nausea and vomiting.  Now resolved. Likely in the setting of body wall edema. Continue symptomatic relief. Continue stool  softeners.   Chronic venous stasis dermatitis. No evidence of cellulitis.   Recent fall. CT head unremarkable. PT OT consult.  Recommends home health OT and PT.   Macrocytosis. B12 stable.  TSH stable.  Monitor.   Paroxysmal A-fib On rate control medication.  Not on any anticoagulation.  Switching Coreg to Toprol-XL.   HTN. Blood pressure stable. Monitor.   Underweight Body mass index is 14.89 kg/m. Placing the patient at high risk for poor outcome.   Stage II coccyx pressure ulcer present on admission Continue to monitor outpatient.  Consultants:  Cardiology   Procedures performed:  Echocardiogram   DISCHARGE MEDICATION: Allergies as of 04/21/2022       Reactions   Sulfamethoxazole Anaphylaxis, Shortness Of Breath, Swelling        Medication List     STOP taking these medications    carvedilol 6.25 MG tablet Commonly known as: COREG       TAKE these medications    acetaminophen 650 MG CR tablet Commonly known as: TYLENOL Take 2 tablets (1,300 mg total) by mouth 2 (two) times daily as needed for pain. What changed:  how much to take when to take this reasons to take this additional instructions   B-complex with vitamin C tablet Take 1 tablet by mouth daily.   Cranberry 250 MG Tabs Take 500 mg by mouth as needed (uti).   diclofenac Sodium 1 % Gel Commonly known as: VOLTAREN Apply 2 g topically 2 (two) times daily as needed (joint pains).   EYE DROPS OP Place 1-2 drops into both eyes as needed (itching).   metoprolol succinate 25 MG 24 hr tablet Commonly known as:  TOPROL-XL Take 1 tablet (25 mg total) by mouth daily.   mirtazapine 15 MG tablet Commonly known as: REMERON TAKE 1 TABLET(15 MG) BY MOUTH AT BEDTIME What changed: See the new instructions.   torsemide 20 MG tablet Commonly known as: DEMADEX Take 1 tablet (20 mg total) by mouth daily. What changed: when to take this       Disposition: Home Diet recommendation:  Cardiac diet  Discharge Exam: Vitals:   04/20/22 2036 04/21/22 0500 04/21/22 0606 04/21/22 1236  BP: 113/67  119/64 121/64  Pulse: 94  82 84  Resp: '18  19 16  '$ Temp: 98.4 F (36.9 C)  98.2 F (36.8 C) 97.8 F (36.6 C)  TempSrc: Oral  Oral Oral  SpO2: 99%  96% 100%  Weight:  40.6 kg    Height:       General: Appear in mild distress; no visible Abnormal Neck Mass Or lumps, Conjunctiva normal Cardiovascular: S1 and S2 Present, aortic systolic  Murmur, Respiratory: good respiratory effort, Bilateral Air entry present and faint basal Crackles, no wheezes Abdomen: Bowel Sound present, Non tender  Extremities: no Pedal edema Neurology: alert and oriented to time, place, and person  Filed Weights   04/19/22 0500 04/20/22 0433 04/21/22 0500  Weight: 41.9 kg 40.6 kg 40.6 kg   Condition at discharge: stable  The results of significant diagnostics from this hospitalization (including imaging, microbiology, ancillary and laboratory) are listed below for reference.   Imaging Studies: ECHOCARDIOGRAM COMPLETE  Result Date: 04/18/2022    ECHOCARDIOGRAM REPORT   Patient Name:   Noma J Rinella Date of Exam: 04/18/2022 Medical Rec #:  [id number]    Height:       60.0 in Accession #:    [id number]   Weight:       97.2 lb Date of Birth:  [birth year]   BSA:          1.374 m Patient Age:    89 years     BP:           114/59 mmHg Patient Gender: F            HR:           91 bpm. Exam Location:  [place] [facility] Procedure: 2D Echo, Color Doppler and Cardiac Doppler Indications:    Cardiomyopathy-Unspecified I42.9  History:        Patient has prior history of Echocardiogram examinations, most                 recent 04/03/2015. Cardiomyopathy and CHF, Pacemaker,                 Arrythmias:Atrial Fibrillation; Risk Factors:Dyslipidemia,                 Hypertension and Former Smoker.  Sonographer:    Greer Pickerel Referring Phys: [id number] [facility] AMIN  Sonographer Comments: Image acquisition challenging due to patient  body habitus. IMPRESSIONS  1. Left ventricular ejection fraction, by estimation, is 20 to 25%. The left ventricle has severely decreased function. The left ventricle demonstrates global hypokinesis. The left ventricular internal cavity size was moderately dilated. Left ventricular diastolic parameters are consistent with Grade II diastolic dysfunction (pseudonormalization). Elevated left atrial pressure.  2. Right ventricular systolic function is mildly reduced. The right ventricular size is normal. There is moderately elevated pulmonary artery systolic pressure. The estimated right ventricular systolic pressure is 66.2 mmHg.  3. Left atrial size was severely dilated.  4. Right atrial size was  severely dilated.  5. A small pericardial effusion is present.  6. The mitral valve is abnormal. Severe mitral valve regurgitation. Appears functional. No evidence of mitral stenosis.  7. Tricuspid valve regurgitation is moderate.  8. The aortic valve was not well visualized. Aortic valve regurgitation is mild. No aortic stenosis is present.  9. The inferior vena cava is dilated in size with <50% respiratory variability, suggesting right atrial pressure of 15 mmHg. FINDINGS  Left Ventricle: Left ventricular ejection fraction, by estimation, is 20 to 25%. The left ventricle has severely decreased function. The left ventricle demonstrates global hypokinesis. The left ventricular internal cavity size was moderately dilated. There is no left ventricular hypertrophy. Left ventricular diastolic parameters are consistent with Grade II diastolic dysfunction (pseudonormalization). Elevated left atrial pressure. Right Ventricle: The right ventricular size is normal. Right vetricular wall thickness was not well visualized. Right ventricular systolic function is mildly reduced. There is moderately elevated pulmonary artery systolic pressure. The tricuspid regurgitant velocity is 3.04 m/s, and with an assumed right atrial pressure of 15  mmHg, the estimated right ventricular systolic pressure is 95.6 mmHg. Left Atrium: Left atrial size was severely dilated. Right Atrium: Right atrial size was severely dilated. Pericardium: A small pericardial effusion is present. Mitral Valve: The mitral valve is abnormal. Severe mitral valve regurgitation. No evidence of mitral valve stenosis. MV peak gradient, 5.4 mmHg. The mean mitral valve gradient is 2.0 mmHg. Tricuspid Valve: The tricuspid valve is normal in structure. Tricuspid valve regurgitation is moderate. Aortic Valve: The aortic valve was not well visualized. Aortic valve regurgitation is mild. Aortic regurgitation PHT measures 279 msec. No aortic stenosis is present. Pulmonic Valve: The pulmonic valve was grossly normal. Pulmonic valve regurgitation is mild. Aorta: The aortic root and ascending aorta are structurally normal, with no evidence of dilitation. Venous: The inferior vena cava is dilated in size with less than 50% respiratory variability, suggesting right atrial pressure of 15 mmHg. IAS/Shunts: The interatrial septum was not well visualized.  LEFT VENTRICLE PLAX 2D LVIDd:         5.15 cm   Diastology LVIDs:         4.60 cm   LV e' medial:    3.02 cm/s LV PW:         1.20 cm   LV E/e' medial:  46.0 LV IVS:        0.70 cm   LV e' lateral:   4.19 cm/s LVOT diam:     1.80 cm   LV E/e' lateral: 33.2 LV SV:         31 LV SV Index:   22 LVOT Area:     2.54 cm  RIGHT VENTRICLE RV S prime:     8.02 cm/s TAPSE (M-mode): 1.0 cm LEFT ATRIUM              Index        RIGHT ATRIUM           Index LA diam:        4.60 cm  3.35 cm/m   RA Area:     19.10 cm LA Vol (A2C):   122.0 ml 88.81 ml/m  RA Volume:   73.60 ml  53.58 ml/m LA Vol (A4C):   66.2 ml  48.19 ml/m LA Biplane Vol: 97.2 ml  70.76 ml/m  AORTIC VALVE             PULMONIC VALVE LVOT Vmax:   77.70 cm/s  PR End Diast Vel:  12.18 msec LVOT Vmean:  48.100 cm/s LVOT VTI:    0.120 m AI PHT:      279 msec  AORTA Ao Root diam: 3.50 cm Ao Asc diam:   3.20 cm MITRAL VALVE                TRICUSPID VALVE MV Area (PHT): 3.81 cm     TR Peak grad:   37.0 mmHg MV Area VTI:   0.79 cm     TR Vmax:        304.00 cm/s MV Peak grad:  5.4 mmHg MV Mean grad:  2.0 mmHg     SHUNTS MV Vmax:       1.16 m/s     Systemic VTI:  0.12 m MV Vmean:      63.5 cm/s    Systemic Diam: 1.80 cm MV Decel Time: 199 msec MR Peak grad: 106.5 mmHg MR Vmax:      516.00 cm/s MV E velocity: 139.00 cm/s MV A velocity: 101.00 cm/s MV E/A ratio:  1.38 Oswaldo Milian MD Electronically signed by Oswaldo Milian MD Signature Date/Time: 04/18/2022/4:58:38 PM    Final    CT Angio Chest/Abd/Pel for Dissection W and/or Wo Contrast  Result Date: 04/18/2022 CLINICAL DATA:  Aortic acute syndrome. Pain. Nausea since 7 p.m. last night. Abdominal discomfort EXAM: CT ANGIOGRAPHY CHEST, ABDOMEN AND PELVIS TECHNIQUE: Non-contrast CT of the chest was initially obtained. Multidetector CT imaging through the chest, abdomen and pelvis was performed using the standard protocol during bolus administration of intravenous contrast. Multiplanar reconstructed images and MIPs were obtained and reviewed to evaluate the vascular anatomy. RADIATION DOSE REDUCTION: This exam was performed according to the departmental dose-optimization program which includes automated exposure control, adjustment of the mA and/or kV according to patient size and/or use of iterative reconstruction technique. CONTRAST:  31m OMNIPAQUE IOHEXOL 350 MG/ML SOLN COMPARISON:  CT 2010 FINDINGS: CTA CHEST FINDINGS Cardiovascular: The heart is enlarged. Particularly involving the atria. There is a left chest battery pack identified with defibrillator lead extending along the heart. No significant pericardial effusion. The battery pack and motion does cause extensive artifacts as well as the arms being scanned at the patient's side. The thoracic aorta has scattered atherosclerotic calcified plaque. No dissection clearly seen but there is a large  number of artifacts across the thorax. A subtle abnormalities difficult to completely exclude. Diameter of the ascending aorta at the level of the right pulmonary artery measures 3.3 by 3.2 cm. The descending thoracic aorta 2.2 x 2.4 cm. The aortic arch has a diameter approaching 2.7 cm. A bovine type aortic arch is seen with scattered calcified plaque. There is significant artifact obscuring the left subclavian artery. Patency is difficult to determine its origin. Coronary artery calcifications are seen. Of note on the noncontrast dataset no definite mural hematoma along the aorta. Mediastinum/Nodes: No specific abnormal lymph node enlargement identified in the axillary region, hilum or mediastinum. Normal caliber thoracic esophagus. Lungs/Pleura: Advanced emphysematous lung changes. Apical pleural thickening identified. Slightly nodular opacity identified in the middle lobe measuring 10 by 7 mm on series 4, image 107. No pneumothorax. Tiny bilateral dependent pleural effusions. Musculoskeletal: Diffuse degenerative changes along the spine with some kyphosis. Osteopenia. Review of the MIP images confirms the above findings. CTA ABDOMEN AND PELVIS FINDINGS VASCULAR Aorta: Normal caliber abdominal aorta with diffuse moderate calcified atherosclerotic plaque. No dissection or aneurysm formation. Celiac: There is significant calcified plaque at the origin of the celiac with a stenosis of close  to 50%. SMA: Mild plaque at the origin and proximal portion vessel without significant stenosis. Renals: There is some mild plaque noted along the proximal aspects of both renal arteries. No significant stenosis on the left. Mild on the right, less than 50%. IMA: Grossly patent. Inflow: There is ectasia of the left common iliac artery measuring up to 11 mm. Scattered partially calcified plaque along the iliac vessels diffusely bilaterally. There is some noncalcified plaque as well along the external iliac vessels with some mild  stenosis. Veins: Grossly caliber IVC.  Contrast is in the arterial phase. Review of the MIP images confirms the above findings. NON-VASCULAR Hepatobiliary: With the limits of the arterial phase of the contrast bolus, grossly the liver is unremarkable. No obvious mass lesion. There is some stone seen in the dependent portion of the nondilated gallbladder. Pancreas: Moderate atrophy Spleen: . nonenlarged. Adrenals/Urinary Tract: Adrenal glands are preserved. Mild bilateral renal atrophy. Small lower pole right-sided renal cyst. Maximum diameter of 2.3 cm. Hounsfield unit of 12. Similar upper pole left-sided focus with Hounsfield units of 4 and diameter of 17 mm. Bosniak 1 lesions. No specific imaging follow-up recommended. No collecting system dilatation. Preserved contours of the urinary bladder. Stomach/Bowel: On this non oral contrast exam, the large bowel has a normal course and caliber with scattered colonic stool. There is some high density material noted along loops of colon and distal ileum, nonspecific. Please correlate for ingested material. Stomach and small bowel are nondilated. Lymphatic: No specific abnormal lymph node enlargement identified in the abdomen and pelvis. Reproductive: Uterus is not seen. There is a cystic lesion in the left adnexa towards the pelvic sidewall measuring 2.2 by 1.5 cm. This could be ovarian. Hounsfield unit of 8. This was not seen in 1,010. Other: Anasarca identified. No significant ascites. Focal lipoma anterior to the left hip, unchanged from previous. There is significant streak artifact related to the patient's bilateral hip arthroplasties obscuring pelvis. There is also motion and artifact from the patient's arms obscuring the abdomen and pelvis. Musculoskeletal: Degenerative changes identified along the spine and pelvis with some areas of stenosis in the lumbar spine. Review of the MIP images confirms the above findings. IMPRESSION: Limited examination due to multiple  artifacts particularly along the thorax but also some in the abdomen and pelvis. No obvious aortic aneurysm or dissection. Again there are areas on the thorax which were very limited of the aorta and branch vessels. Diffuse atherosclerotic changes identified in the chest, abdomen and pelvis. Slight ectasia of the left common iliac artery. Multilevel mild branch vessels stenosis particularly of the celiac. Defibrillator.  Enlarged heart. Gallstones. Cystic lesion in the left adnexa, likely ovarian. This was not seen on the prior from 2010. Would recommend simple follow-up ultrasound in 3 months to assess stability. Tiny pleural effusions.  Emphysematous lung changes. Electronically Signed   By: Jill Side M.D.   On: 04/18/2022 12:18   CT Head Wo Contrast  Result Date: 04/18/2022 CLINICAL DATA:  Trauma.  Vomiting. EXAM: CT HEAD WITHOUT CONTRAST TECHNIQUE: Contiguous axial images were obtained from the base of the skull through the vertex without intravenous contrast. RADIATION DOSE REDUCTION: This exam was performed according to the departmental dose-optimization program which includes automated exposure control, adjustment of the mA and/or kV according to patient size and/or use of iterative reconstruction technique. COMPARISON:  12/19/2013 FINDINGS: Brain: There is no evidence for acute hemorrhage, hydrocephalus, mass lesion, or abnormal extra-axial fluid collection. No definite CT evidence for acute infarction. Diffuse  loss of parenchymal volume is consistent with atrophy. Patchy low attenuation in the deep hemispheric and periventricular white matter is nonspecific, but likely reflects chronic microvascular ischemic demyelination. Vascular: No hyperdense vessel or unexpected calcification. Skull: No evidence for fracture. No worrisome lytic or sclerotic lesion. Sinuses/Orbits: The visualized paranasal sinuses and mastoid air cells are clear. Visualized portions of the globes and intraorbital fat are  unremarkable. Other: None. IMPRESSION: 1. No acute intracranial abnormality. 2. Atrophy with chronic small vessel ischemic disease. Electronically Signed   By: Misty Stanley M.D.   On: 04/18/2022 10:13   DG Chest Port 1 View  Addendum Date: 04/18/2022   ADDENDUM REPORT: 04/18/2022 10:00 ADDENDUM: Given chest discomfort, CTA of the chest, abdomen and pelvis was recommended to evaluate for aortic dissection versus aneurysm. Critical Value/emergent results were called by telephone at the time of interpretation on 04/18/2022 at 947 am to provider [facility] , who verbally acknowledged these results. Electronically Signed   By: Emmit Alexanders M.D.   On: 04/18/2022 10:00   Result Date: 04/18/2022 CLINICAL DATA:  Dyspnea, nausea and vomiting. EXAM: PORTABLE CHEST 1 VIEW COMPARISON:  Chest radiographs 11/07/2018. FINDINGS: Left chest AICD/pacemaker with leads projecting over the right atrium, right ventricle and coronary sinus. Fourth lead projecting over the right atrium appears to be fractured as it crosses the right atrial pacing lead and right ventricular defibrillator lead. Moderately increased heart size with considerable expansion of the aortic arch calcifications since August 2020. Low lung volumes with probable mild pulmonary edema. No pleural effusion or pneumothorax. IMPRESSION: 1. Considerable expansion of the aortic arch calcifications since August 2020. In the presence of moderately increased heart size, findings are concerning for acute aortic syndrome. Thoracic aortic aneurysm is a differential consideration. 2. Probable mild pulmonary edema. 3. Possibly fractured right heart lead from left chest AICD/pacemaker. Electronically Signed: By: Emmit Alexanders M.D. On: 04/18/2022 09:46   DG Abdomen 1 View  Result Date: 04/18/2022 CLINICAL DATA:  Dyspnea, nausea and vomiting. Abdominal discomfort all across her abdomen. EXAM: ABDOMEN - 1 VIEW COMPARISON:  Pelvis radiograph 09/04/2014. FINDINGS: The bowel  gas pattern is normal. No radio-opaque calculi or other significant radiographic abnormality are seen. Unchanged appearance of bilateral hip hemiarthroplasties. IMPRESSION: Negative. Electronically Signed   By: Emmit Alexanders M.D.   On: 04/18/2022 09:38    Microbiology: Results for orders placed or performed during the hospital encounter of 04/18/22  Resp panel by RT-PCR (RSV, Flu A&B, Covid) Anterior Nasal Swab     Status: None   Collection Time: 04/18/22 10:25 AM   Specimen: Anterior Nasal Swab  Result Value Ref Range Status   SARS Coronavirus 2 by RT PCR NEGATIVE NEGATIVE Final    Comment: (NOTE) SARS-CoV-2 target nucleic acids are NOT DETECTED.  The SARS-CoV-2 RNA is generally detectable in upper respiratory specimens during the acute phase of infection. The lowest concentration of SARS-CoV-2 viral copies this assay can detect is 138 copies/mL. A negative result does not preclude SARS-Cov-2 infection and should not be used as the sole basis for treatment or other patient management decisions. A negative result may occur with  improper specimen collection/handling, submission of specimen other than nasopharyngeal swab, presence of viral mutation(s) within the areas targeted by this assay, and inadequate number of viral copies(<138 copies/mL). A negative result must be combined with clinical observations, patient history, and epidemiological information. The expected result is Negative.  Fact Sheet for Patients:  EntrepreneurPulse.com.au  Fact Sheet for Healthcare Providers:  IncredibleEmployment.be  This  test is no t yet approved or cleared by the [place] and  has been authorized for detection and/or diagnosis of SARS-CoV-2 by FDA under an Emergency Use Authorization (EUA). This EUA will remain  in effect (meaning this test can be used) for the duration of the COVID-19 declaration under Section 564(b)(1) of the Act, 21 U.S.C.section  360bbb-3(b)(1), unless the authorization is terminated  or revoked sooner.       Influenza A by PCR NEGATIVE NEGATIVE Final   Influenza B by PCR NEGATIVE NEGATIVE Final    Comment: (NOTE) The Xpert Xpress SARS-CoV-2/FLU/RSV plus assay is intended as an aid in the diagnosis of influenza from Nasopharyngeal swab specimens and should not be used as a sole basis for treatment. Nasal washings and aspirates are unacceptable for Xpert Xpress SARS-CoV-2/FLU/RSV testing.  Fact Sheet for Patients: EntrepreneurPulse.com.au  Fact Sheet for Healthcare Providers: IncredibleEmployment.be  This test is not yet approved or cleared by the [place] FDA and has been authorized for detection and/or diagnosis of SARS-CoV-2 by FDA under an Emergency Use Authorization (EUA). This EUA will remain in effect (meaning this test can be used) for the duration of the COVID-19 declaration under Section 564(b)(1) of the Act, 21 U.S.C. section 360bbb-3(b)(1), unless the authorization is terminated or revoked.     Resp Syncytial Virus by PCR NEGATIVE NEGATIVE Final    Comment: (NOTE) Fact Sheet for Patients: EntrepreneurPulse.com.au  Fact Sheet for Healthcare Providers: IncredibleEmployment.be  This test is not yet approved or cleared by the [place] FDA and has been authorized for detection and/or diagnosis of SARS-CoV-2 by FDA under an Emergency Use Authorization (EUA). This EUA will remain in effect (meaning this test can be used) for the duration of the COVID-19 declaration under Section 564(b)(1) of the Act, 21 U.S.C. section 360bbb-3(b)(1), unless the authorization is terminated or revoked.  Performed at [facility], [place] [address], [place], [place] [address]    Labs: CBC: Recent Labs  Lab 04/18/22 1025 04/18/22 1031 04/18/22 1624 04/19/22 0448 04/20/22 0517  WBC 11.3*  --  10.0 6.8 5.7   HGB 15.1* 16.7* 14.7 13.7 14.3  HCT 45.7 49.0* 43.5 41.8 42.8  MCV 104.3*  --  103.3* 104.5* 104.1*  PLT 108*  --  106* 103* 540*   Basic Metabolic Panel: Recent Labs  Lab 04/18/22 1025 04/18/22 1031 04/18/22 1100 04/18/22 1350 04/19/22 0448 04/20/22 0517 04/21/22 0415  NA 136 139  --   --  139 137 135  K 3.0* 3.0*  --   --  4.8 3.4* 4.6  CL 98 95*  --   --  103 99 103  CO2 29  --   --   --  '28 31 25  '$ GLUCOSE 155* 158*  --   --  133* 118* 154*  BUN 10 8  --   --  '11 13 15  '$ CREATININE 0.76 0.70 0.75  --  0.70 0.81 0.82  CALCIUM 8.8*  --   --   --  9.2 8.8* 8.9  MG  --   --   --  1.6* 1.9 2.0 2.3   Liver Function Tests: Recent Labs  Lab 04/18/22 1025  AST 28  ALT 17  ALKPHOS 61  BILITOT 1.5*  PROT 6.8  ALBUMIN 3.7   CBG: No results for input(s): "GLUCAP" in the last 168 hours.  Discharge time spent: greater than 30 minutes.  Signed: Berle Mull, MD [facility] 04/21/2022

## 2022-04-29 DIAGNOSIS — I110 Hypertensive heart disease with heart failure: Secondary | ICD-10-CM | POA: Diagnosis not present

## 2022-04-29 DIAGNOSIS — I429 Cardiomyopathy, unspecified: Secondary | ICD-10-CM | POA: Diagnosis not present

## 2022-04-29 DIAGNOSIS — G609 Hereditary and idiopathic neuropathy, unspecified: Secondary | ICD-10-CM | POA: Diagnosis not present

## 2022-04-29 DIAGNOSIS — Z95810 Presence of automatic (implantable) cardiac defibrillator: Secondary | ICD-10-CM | POA: Diagnosis not present

## 2022-04-29 DIAGNOSIS — M810 Age-related osteoporosis without current pathological fracture: Secondary | ICD-10-CM | POA: Diagnosis not present

## 2022-04-29 DIAGNOSIS — I5023 Acute on chronic systolic (congestive) heart failure: Secondary | ICD-10-CM | POA: Diagnosis not present

## 2022-04-29 DIAGNOSIS — L03116 Cellulitis of left lower limb: Secondary | ICD-10-CM | POA: Diagnosis not present

## 2022-04-29 DIAGNOSIS — I7300 Raynaud's syndrome without gangrene: Secondary | ICD-10-CM | POA: Diagnosis not present

## 2022-04-29 DIAGNOSIS — I480 Paroxysmal atrial fibrillation: Secondary | ICD-10-CM | POA: Diagnosis not present

## 2022-04-29 DIAGNOSIS — L89150 Pressure ulcer of sacral region, unstageable: Secondary | ICD-10-CM | POA: Diagnosis not present

## 2022-04-30 DIAGNOSIS — I480 Paroxysmal atrial fibrillation: Secondary | ICD-10-CM | POA: Diagnosis not present

## 2022-04-30 DIAGNOSIS — I7300 Raynaud's syndrome without gangrene: Secondary | ICD-10-CM | POA: Diagnosis not present

## 2022-04-30 DIAGNOSIS — Z95810 Presence of automatic (implantable) cardiac defibrillator: Secondary | ICD-10-CM | POA: Diagnosis not present

## 2022-04-30 DIAGNOSIS — G609 Hereditary and idiopathic neuropathy, unspecified: Secondary | ICD-10-CM | POA: Diagnosis not present

## 2022-04-30 DIAGNOSIS — M810 Age-related osteoporosis without current pathological fracture: Secondary | ICD-10-CM | POA: Diagnosis not present

## 2022-04-30 DIAGNOSIS — L03116 Cellulitis of left lower limb: Secondary | ICD-10-CM | POA: Diagnosis not present

## 2022-04-30 DIAGNOSIS — I5023 Acute on chronic systolic (congestive) heart failure: Secondary | ICD-10-CM | POA: Diagnosis not present

## 2022-04-30 DIAGNOSIS — L89150 Pressure ulcer of sacral region, unstageable: Secondary | ICD-10-CM | POA: Diagnosis not present

## 2022-04-30 DIAGNOSIS — I429 Cardiomyopathy, unspecified: Secondary | ICD-10-CM | POA: Diagnosis not present

## 2022-04-30 DIAGNOSIS — I110 Hypertensive heart disease with heart failure: Secondary | ICD-10-CM | POA: Diagnosis not present

## 2022-05-08 ENCOUNTER — Encounter: Payer: Self-pay | Admitting: Internal Medicine

## 2022-05-08 ENCOUNTER — Ambulatory Visit: Payer: Medicare HMO | Attending: Internal Medicine | Admitting: Internal Medicine

## 2022-05-08 VITALS — BP 112/70 | HR 93 | Ht 65.0 in | Wt 93.4 lb

## 2022-05-08 DIAGNOSIS — Z95810 Presence of automatic (implantable) cardiac defibrillator: Secondary | ICD-10-CM | POA: Diagnosis not present

## 2022-05-08 DIAGNOSIS — R000 Tachycardia, unspecified: Secondary | ICD-10-CM

## 2022-05-08 DIAGNOSIS — I5022 Chronic systolic (congestive) heart failure: Secondary | ICD-10-CM

## 2022-05-08 DIAGNOSIS — I428 Other cardiomyopathies: Secondary | ICD-10-CM

## 2022-05-08 DIAGNOSIS — Z79899 Other long term (current) drug therapy: Secondary | ICD-10-CM

## 2022-05-08 MED ORDER — TORSEMIDE 20 MG PO TABS: 40.0000 mg | ORAL_TABLET | Freq: Every day | ORAL | 3 refills | Status: DC

## 2022-05-08 NOTE — Patient Instructions (Addendum)
Medication Instructions:  Your physician has recommended you make the following change in your medication:   Increase Demadex to '40mg'$  (2tablets) by mouth daily.  *If you need a refill on your cardiac medications before your next appointment, please call your pharmacy*   Lab Work: BMET in 2 weeks  If you have labs (blood work) drawn today and your tests are completely normal, you will receive your results only by: [place] (if you have MyChart) OR A paper copy in the mail If you have any lab test that is abnormal or we need to change your treatment, we will call you to review the results.   Testing/Procedures: None ordered.    Follow-Up: At [facility], you and your health needs are our priority.  As part of our continuing mission to provide you with exceptional heart care, we have created designated Provider Care Teams.  These Care Teams include your primary Cardiologist (physician) and Advanced Practice Providers (APPs -  Physician Assistants and Nurse Practitioners) who all work together to provide you with the care you need, when you need it.  We recommend signing up for the patient portal called "MyChart".  Sign up information is provided on this After Visit Summary.  MyChart is used to connect with patients for Virtual Visits (Telemedicine).  Patients are able to view lab/test results, encounter notes, upcoming appointments, etc.  Non-urgent messages can be sent to your provider as well.   To learn more about what you can do with MyChart, go to [email or web address].    Your next appointment:   12 months with Dr Caryl Comes  DNR provided to pt and daughter

## 2022-05-08 NOTE — Progress Notes (Signed)
Patient Care Team: [name], MD as PCP - General (Family Medicine) Deboraha Sprang, MD as Consulting Physician (Cardiology)   HPI  [name] is a 89 y.o. female seen in follow-up for CRT-.Saint Jude implanted remotely and changed out 5/21  Weak and swollen intercurrent hospitalization for acute on chronic congestive heart failure 1/24 treated with IV diuresis significant improvement has had reaccumulation of edema at home.  Diet is deplete of sodium replete fluid  No chest pain   No lightheaded ness  Non intentional weight loss > 10 % over 18 m      Date Cr K Hgb  3/18  0.86 4.3 13.6(2/17)   5/21 0.71 4.6 13.1   4/22 0.84 4.2 15.6  1/24 0.82 4.6<<3.4 14.3   DATE TEST EF   12/06 LHC 29% Normal Cors   1/17 TTE  25-30 % AR Mild, MR Mod  1/24 Echo  20-25%     Device History: ICD implanted primary prevention     Date Gen Lead Status   2007 ICD ( at [place])    2012 ICD  LV lead dislodged   12/14 St Jude (CRT-P) LV lead    5/21 St Jude (CRT-P   Gen change          Records and Results Reviewed   Past Medical History:  Diagnosis Date   Biventricular ICD (implantable cardioverter-defibrillator)-St. Jude    Date of implant 2007 generator change 2012   Hyperlipidemia    Hypertension    Macro dislodgment of the left ventricular lead 09/09/2012   Nonischemic cardiomyopathy ([place])    Cath without coronary disease 2006   Nonischemic cardiomyopathy ([place])    Osteoporosis    PAF (paroxysmal atrial fibrillation) (HCC)    Short bursts of PAF. Asymptomatic.  Sees Dr Rex Kras.   Peripheral neuropathy, hereditary/idiopathic 2012   Raynaud phenomenon    Sinus tachycardia    Systolic CHF [facility])    Urge incontinence     Past Surgical History:  Procedure Laterality Date   ABDOMINAL HYSTERECTOMY  1970s   APPENDECTOMY     BI-VENTRICULAR PACEMAKER INSERTION N/A 03/28/2013   Procedure: BI-VENTRICULAR PACEMAKER INSERTION (CRT-P);  Surgeon: Deboraha Sprang,  MD;  Location: [facility] CATH LAB;  Service: Cardiovascular;  Laterality: N/A;   Biventricular ICD Implant  06/28/2010; 03/28/2013   STJ CRTD implanted 2012 with dislodgement of LV lead 2014; 03/28/2013 CRTP placed with new LV lead by Dr Caryl Comes   Bladder tack     CARDIAC CATHETERIZATION  03/2005   EF 25%, Normal arteries, Nonischemic CM   HIP ARTHROPLASTY Left 12/20/2013   Procedure: LEFT HIP HEMI ARTHROPLASTY;  Surgeon: Meredith Pel, MD;  Location: [place];  Service: Orthopedics;  Laterality: Left;   LEFT AND RIGHT HEART CATHETERIZATION WITH CORONARY ANGIOGRAM N/A 12/13/2012   Procedure: LEFT AND RIGHT HEART CATHETERIZATION WITH CORONARY ANGIOGRAM;  Surgeon: Blane Ohara, MD;  Location: [facility] CATH LAB;  Service: Cardiovascular;  Laterality: N/A;   NM MYOCAR PERF WALL MOTION  03/19/2010   EF 63%, low risk   PPM GENERATOR CHANGEOUT N/A 08/16/2019   Procedure: PPM GENERATOR CHANGEOUT;  Surgeon: Deboraha Sprang, MD;  Location: [place] CV LAB;  Service: Cardiovascular;  Laterality: N/A;   Suprapubic Urethrovesicular Suspension  1992   TOTAL HIP ARTHROPLASTY Right 09/04/2014   Procedure: BIPOLAR HIP ARTHROPLASTY ANTERIOR APPROACH;  Surgeon: Mcarthur Rossetti, MD;  Location: [place];  Service: Orthopedics;  Laterality: Right;  US ECHOCARDIOGRAPHY  11/18/2011   EF 40%, LA moderately dilated, mild AI    Current Outpatient Medications  Medication Sig Dispense Refill   acetaminophen (TYLENOL) 650 MG CR tablet Take 2 tablets (1,300 mg total) by mouth 2 (two) times daily as needed for pain.     B Complex-C (B-COMPLEX WITH VITAMIN C) tablet Take 1 tablet by mouth daily. 30 tablet 0   Carboxymethylcellulose Sodium (EYE DROPS OP) Place 1-2 drops into both eyes as needed (itching).     diclofenac Sodium (VOLTAREN) 1 % GEL Apply 2 g topically 2 (two) times daily as needed (joint pains). 50 g 0   metoprolol succinate (TOPROL-XL) 25 MG 24 hr tablet Take 1 tablet (25 mg total) by mouth daily. 30 tablet 0    mirtazapine (REMERON) 15 MG tablet TAKE 1 TABLET(15 MG) BY MOUTH AT BEDTIME (Patient taking differently: Take 15 mg by mouth at bedtime.) 90 tablet 1   Cranberry 250 MG TABS Take 500 mg by mouth as needed (uti).     torsemide (DEMADEX) 20 MG tablet Take 2 tablets (40 mg total) by mouth daily. 60 tablet 3   No current facility-administered medications for this visit.    Allergies  Allergen Reactions   Sulfamethoxazole Anaphylaxis, Shortness Of Breath and Swelling           Review of Systems negative except from HPI and PMH  Physical Exam BP 112/70   Pulse 93   Ht '5\' 5"'$  (1.651 m)   Wt 93 lb 6.4 oz (42.4 kg)   SpO2 97%   BMI 15.54 kg/m  Well developed and well nourished in no acute distress HENT normal Neck supple with JVP-flat Clear Device pocket well healed; without hematoma or erythema.  There is no tethering  Regular rate and rhythm, no gallop 2/6 murmur Abd-soft with active BS No Clubbing cyanosis 2-3+ edema Skin-warm and dry A & Oriented  Grossly normal sensory and motor function  ECG Sinus P-synchronous/ AV  pacing  Upright QRS lead 1 and rS V1 Unchanged from 2016 although there is mild widening of the QRS from 125 ms--155 ms  Device function is  normal.  Programming changes none  See Paceart for details       Assessment and  Plan Nonischemic cardiomyopathy   Congestive heart failure-chronic systolic   Implantable device-CRT>>> defibrillator-->pacemaker     Sinus tachycardia   Fatigue/daytime somnolence  Significantly volume overloaded.  Will increase her torsemide from 20 daily to 40 daily.  Plan to check a metabolic profile in a couple weeks.  Blood pressure is limited up titration of guideline directed therapy  Discussion reend-of-life.  DNR was written.  Being Mortal recommended  Significant weight loss.  Will reach out to see if we get her dietary consult

## 2022-05-12 ENCOUNTER — Ambulatory Visit (INDEPENDENT_AMBULATORY_CARE_PROVIDER_SITE_OTHER): Payer: Medicare HMO | Admitting: Family Medicine

## 2022-05-12 VITALS — BP 112/70 | HR 64 | Ht 65.0 in | Wt 92.2 lb

## 2022-05-12 DIAGNOSIS — I5022 Chronic systolic (congestive) heart failure: Secondary | ICD-10-CM | POA: Diagnosis not present

## 2022-05-12 DIAGNOSIS — R5382 Chronic fatigue, unspecified: Secondary | ICD-10-CM | POA: Diagnosis not present

## 2022-05-12 NOTE — Assessment & Plan Note (Signed)
Doing well after hospitalization.  Continue current medications.  Following up in a couple of weeks for renal function check after adjustment of torsemide.

## 2022-05-12 NOTE — Progress Notes (Signed)
  Date of Visit: 05/12/2022   SUBJECTIVE:   HPI:  Nesreen presents today for hospital follow-up.    CHF: She was hospitalized from January 19 through 22 for volume overload.  Diuresed.  Followed up with cardiology last week, at which time they increased her torsemide to 40 mg daily.  She has been feeling well on this dose.  She is returning for lab work in a couple of weeks.  Overall she is feeling well.  Does get winded with walking from 1 end of her house to the other, but states that she feels pretty good for 89 years old.  Sleep: Currently taking Remeron 15 mg at bedtime.  Tolerating this well.  She does not want to try any higher doses.   OBJECTIVE:   BP 112/70   Pulse 64   Ht '5\' 5"'$  (1.651 m)   Wt 92 lb 3.2 oz (41.8 kg)   BMI 15.34 kg/m  Gen: No acute distress, pleasant, cooperative HEENT: Normocephalic, atraumatic Heart: Regular rate and rhythm, no murmur Lungs: Clear to auscultation bilaterally, normal effort Neuro: Grossly nonfocal, speech normal Ext: 2+ edema bilateral lower extremities with changes of venous stasis dermatitis present on both shins  ASSESSMENT/PLAN:   Health maintenance:  -Declines annual wellness visit  Chronic systolic heart failure ([place]) Doing well after hospitalization.  Continue current medications.  Following up in a couple of weeks for renal function check after adjustment of torsemide.  Fatigue Doing okay with Remeron.  Continue this medication.   [place]. [name], [place]

## 2022-05-12 NOTE — Patient Instructions (Signed)
It was great to see you again today.  Stay on current medications Call with any questions  Be well, Dr. [name]

## 2022-05-12 NOTE — Assessment & Plan Note (Signed)
Doing okay with Remeron.  Continue this medication.

## 2022-05-21 ENCOUNTER — Other Ambulatory Visit: Payer: Self-pay

## 2022-05-21 MED ORDER — METOPROLOL SUCCINATE ER 25 MG PO TB24: 25.0000 mg | ORAL_TABLET | Freq: Every day | ORAL | 3 refills | Status: DC

## 2022-05-21 NOTE — Telephone Encounter (Signed)
Patient was notified that her metoprolol her refill was sent to [facility].

## 2022-05-26 ENCOUNTER — Telehealth: Payer: Self-pay | Admitting: Internal Medicine

## 2022-05-26 ENCOUNTER — Ambulatory Visit: Payer: Medicare HMO | Attending: Internal Medicine

## 2022-05-26 DIAGNOSIS — I428 Other cardiomyopathies: Secondary | ICD-10-CM

## 2022-05-26 DIAGNOSIS — Z95810 Presence of automatic (implantable) cardiac defibrillator: Secondary | ICD-10-CM

## 2022-05-26 DIAGNOSIS — Z79899 Other long term (current) drug therapy: Secondary | ICD-10-CM | POA: Diagnosis not present

## 2022-05-26 DIAGNOSIS — R000 Tachycardia, unspecified: Secondary | ICD-10-CM | POA: Diagnosis not present

## 2022-05-26 DIAGNOSIS — I5022 Chronic systolic (congestive) heart failure: Secondary | ICD-10-CM

## 2022-05-26 NOTE — Telephone Encounter (Signed)
Spoke with pt who states she is to serve jury duty April 30th, 2024.  She would like a letter requesting she be excused due to her health. Pt advised Dr Caryl Comes is out of the office this week but will forward for his approval.  Pt verbalizes understanding and thanked RN for the callback.

## 2022-05-26 NOTE — Telephone Encounter (Signed)
Patient would like a note  to be excused from jury duty.  Please call patient.

## 2022-05-27 ENCOUNTER — Telehealth: Payer: Self-pay | Admitting: Family Medicine

## 2022-05-27 LAB — BASIC METABOLIC PANEL
BUN/Creatinine Ratio: 26 (ref 12–28)
BUN: 21 mg/dL (ref 10–36)
CO2: 27 mmol/L (ref 20–29)
Calcium: 9.7 mg/dL (ref 8.7–10.3)
Chloride: 99 mmol/L (ref 96–106)
Creatinine, Ser: 0.8 mg/dL (ref 0.57–1.00)
Glucose: 133 mg/dL — ABNORMAL HIGH (ref 70–99)
Potassium: 3.8 mmol/L (ref 3.5–5.2)
Sodium: 143 mmol/L (ref 134–144)
eGFR: 70 mL/min/{1.73_m2} (ref >59)

## 2022-05-27 NOTE — Telephone Encounter (Signed)
Patient called stating she received a letter for [employer] duty. She is wanting to know if doctor could write her a letter to excuse her from jury duty.   Jury # [id number] Date: 07/29/2022  Please call patient once complete so she can pick it up.   Thanks, [name]

## 2022-06-03 NOTE — Telephone Encounter (Signed)
Called and informed patient of letter at front for pick up.  [name], [place]

## 2022-06-03 NOTE — Telephone Encounter (Signed)
Letter written, will place at front desk, please inform patient  [name], MD

## 2022-06-09 ENCOUNTER — Ambulatory Visit (INDEPENDENT_AMBULATORY_CARE_PROVIDER_SITE_OTHER): Payer: Medicare HMO

## 2022-06-09 DIAGNOSIS — I428 Other cardiomyopathies: Secondary | ICD-10-CM

## 2022-06-12 LAB — CUP PACEART REMOTE DEVICE CHECK
Battery Remaining Longevity: 58 mo
Battery Remaining Percentage: 64 %
Battery Voltage: 2.98 V
Brady Statistic AP VP Percent: 2.4 %
Brady Statistic AP VS Percent: 1 %
Brady Statistic AS VP Percent: 89 %
Brady Statistic AS VS Percent: 2.2 %
Brady Statistic RA Percent Paced: 1 %
Date Time Interrogation Session: 20240313165328
Implantable Lead Connection Status: 753985
Implantable Lead Connection Status: 753985
Implantable Lead Connection Status: 753985
Implantable Lead Implant Date: 20070712
Implantable Lead Implant Date: 20070712
Implantable Lead Implant Date: 20141229
Implantable Lead Location: 753858
Implantable Lead Location: 753859
Implantable Lead Location: 753860
Implantable Lead Model: 7020
Implantable Pulse Generator Implant Date: 20210518
Lead Channel Impedance Value: 280 Ohm
Lead Channel Impedance Value: 400 Ohm
Lead Channel Impedance Value: 410 Ohm
Lead Channel Pacing Threshold Amplitude: 0.625 V
Lead Channel Pacing Threshold Amplitude: 0.75 V
Lead Channel Pacing Threshold Amplitude: 1.125 V
Lead Channel Pacing Threshold Pulse Width: 0.4 ms
Lead Channel Pacing Threshold Pulse Width: 0.4 ms
Lead Channel Pacing Threshold Pulse Width: 0.7 ms
Lead Channel Sensing Intrinsic Amplitude: 0.5 mV
Lead Channel Sensing Intrinsic Amplitude: 12 mV
Lead Channel Setting Pacing Amplitude: 2 V
Lead Channel Setting Pacing Amplitude: 2 V
Lead Channel Setting Pacing Amplitude: 2 V
Lead Channel Setting Pacing Pulse Width: 0.4 ms
Lead Channel Setting Pacing Pulse Width: 0.7 ms
Lead Channel Setting Sensing Sensitivity: 2 mV
Pulse Gen Model: 3562
Pulse Gen Serial Number: 3818460

## 2022-06-14 ENCOUNTER — Ambulatory Visit (HOSPITAL_COMMUNITY)
Admission: EM | Admit: 2022-06-14 | Discharge: 2022-06-14 | Disposition: A | Discharge status: Home / Self Care | Payer: Medicare HMO | Attending: Emergency Medicine | Admitting: Emergency Medicine

## 2022-06-14 ENCOUNTER — Encounter (HOSPITAL_COMMUNITY): Payer: Self-pay

## 2022-06-14 DIAGNOSIS — R2243 Localized swelling, mass and lump, lower limb, bilateral: Secondary | ICD-10-CM

## 2022-06-14 MED ORDER — CEPHALEXIN 500 MG PO CAPS: 500.0000 mg | ORAL_CAPSULE | Freq: Two times a day (BID) | ORAL | 0 refills | Status: AC

## 2022-06-14 MED ORDER — TORSEMIDE 20 MG PO TABS: 20.0000 mg | ORAL_TABLET | Freq: Every day | ORAL | 0 refills | Status: DC

## 2022-06-14 NOTE — ED Provider Notes (Signed)
[facility]    CSN: [id number] Arrival date & time: 06/14/22  1137      History   Chief Complaint Chief Complaint  Patient presents with   Fall    HPI [name] is a 89 y.o. female.   Patient presents for evaluation of increased swelling and pain present to the bilateral lower extremities beginning after fall that occurred 6 days ago.  Using walker at baseline, endorses that she tripped, unsure of landing, denies hitting head or loss of consciousness, denies use of blood thinners.  Has swelling at baseline due to CHF but endorses has significantly worsened, right side worse than left.  Has noticed erythema to the right lower extremity primarily and discoloration to the right foot.  Has attempted use of Tylenol arthritis which has been minimally effective.  Experiencing neuropathy at baseline, has not worsened .   Past Medical History:  Diagnosis Date   Biventricular ICD (implantable cardioverter-defibrillator)-St. Jude    Date of implant 2007 generator change 2012   Hyperlipidemia    Hypertension    Macro dislodgment of the left ventricular lead 09/09/2012   Nonischemic cardiomyopathy ([place])    Cath without coronary disease 2006   Nonischemic cardiomyopathy (HCC)    Osteoporosis    PAF (paroxysmal atrial fibrillation) (HCC)    Short bursts of PAF. Asymptomatic.  Sees Dr Rex Kras.   Peripheral neuropathy, hereditary/idiopathic 2012   Raynaud phenomenon    Sinus tachycardia    Systolic CHF [facility])    Urge incontinence     Patient Active Problem List   Diagnosis Date Noted   Prediabetes 06/25/2016   Pes anserine bursitis 06/16/2014   Paroxysmal atrial fibrillation ([place]) 12/27/2013   Peripheral neuropathy 10/03/2013   PVC's (premature ventricular contractions) 01/12/2013   Macro dislodgment of the left ventricular lead 09/09/2012   Biventricular ICD (implantable cardioverter-defibrillator)-St. Jude    Nonischemic cardiomyopathy ([place])    Sinus tachycardia     Mole (skin) 06/08/2011   Left knee pain 04/11/2011   Fatigue 03/11/2010   Hyperlipidemia 05/28/2006   HYPERTENSION, BENIGN SYSTEMIC [id number]   Chronic systolic heart failure ([place]) 05/28/2006   Osteoporosis 05/28/2006   INCONTINENCE, URGE 05/28/2006    Past Surgical History:  Procedure Laterality Date   ABDOMINAL HYSTERECTOMY  1970s   APPENDECTOMY     BI-VENTRICULAR PACEMAKER INSERTION N/A 03/28/2013   Procedure: BI-VENTRICULAR PACEMAKER INSERTION (CRT-P);  Surgeon: Deboraha Sprang, MD;  Location: [facility] CATH LAB;  Service: Cardiovascular;  Laterality: N/A;   Biventricular ICD Implant  06/28/2010; 03/28/2013   STJ CRTD implanted 2012 with dislodgement of LV lead 2014; 03/28/2013 CRTP placed with new LV lead by Dr Caryl Comes   Bladder tack     CARDIAC CATHETERIZATION  03/2005   EF 25%, Normal arteries, Nonischemic CM   HIP ARTHROPLASTY Left 12/20/2013   Procedure: LEFT HIP HEMI ARTHROPLASTY;  Surgeon: Meredith Pel, MD;  Location: [place];  Service: Orthopedics;  Laterality: Left;   LEFT AND RIGHT HEART CATHETERIZATION WITH CORONARY ANGIOGRAM N/A 12/13/2012   Procedure: LEFT AND RIGHT HEART CATHETERIZATION WITH CORONARY ANGIOGRAM;  Surgeon: Blane Ohara, MD;  Location: [facility] CATH LAB;  Service: Cardiovascular;  Laterality: N/A;   NM MYOCAR PERF WALL MOTION  03/19/2010   EF 63%, low risk   PPM GENERATOR CHANGEOUT N/A 08/16/2019   Procedure: PPM GENERATOR CHANGEOUT;  Surgeon: Deboraha Sprang, MD;  Location: [place] CV LAB;  Service: Cardiovascular;  Laterality: N/A;   Suprapubic [place]  TOTAL HIP ARTHROPLASTY Right 09/04/2014   Procedure: BIPOLAR HIP ARTHROPLASTY ANTERIOR APPROACH;  Surgeon: Mcarthur Rossetti, MD;  Location: [place];  Service: Orthopedics;  Laterality: Right;   US ECHOCARDIOGRAPHY  11/18/2011   EF 40%, LA moderately dilated, mild AI    OB History   No obstetric history on file.      Home Medications    Prior to Admission medications    Medication Sig Start Date End Date Taking? Authorizing Provider  acetaminophen (TYLENOL) 650 MG CR tablet Take 2 tablets (1,300 mg total) by mouth 2 (two) times daily as needed for pain. 04/21/22   Lavina Hamman, MD  B Complex-C (B-COMPLEX WITH VITAMIN C) tablet Take 1 tablet by mouth daily. 04/22/22   Lavina Hamman, MD  Carboxymethylcellulose Sodium (EYE DROPS OP) Place 1-2 drops into both eyes as needed (itching).    [provider]  diclofenac Sodium (VOLTAREN) 1 % GEL Apply 2 g topically 2 (two) times daily as needed (joint pains). 04/21/22   Lavina Hamman, MD  metoprolol succinate (TOPROL-XL) 25 MG 24 hr tablet Take 1 tablet (25 mg total) by mouth daily. 05/21/22   Deboraha Sprang, MD  mirtazapine (REMERON) 15 MG tablet TAKE 1 TABLET(15 MG) BY MOUTH AT BEDTIME Patient taking differently: Take 15 mg by mouth at bedtime. 03/18/22   [name], MD  torsemide (DEMADEX) 20 MG tablet Take 2 tablets (40 mg total) by mouth daily. 05/08/22   Deboraha Sprang, MD    Family History Family History  Problem Relation Age of Onset   Heart disease Mother    Heart disease Father    Cancer Father        Kidney   Arthritis Sister    Heart disease Brother    Diabetes Son    Mental retardation Son    Alcohol abuse Sister    Cancer Brother    Cancer Daughter    Heart disease Daughter     Social History Social History   Tobacco Use   Smoking status: Former    Packs/day: 1.00    Years: 15.00    Additional pack years: 0.00    Total pack years: 15.00    Types: Cigarettes    Quit date: 04/22/2002    Years since quitting: 20.1   Smokeless tobacco: Never  Vaping Use   Vaping Use: Never used  Substance Use Topics   Alcohol use: No    Comment: Recovering Alcoholic 35 yrs ago. Quit 1979.   Drug use: No    Comment: Prescription drug addict x 41 years.     Allergies   Sulfamethoxazole   Review of Systems Review of Systems Defer to HPI    Physical Exam Triage Vital  Signs ED Triage Vitals [06/14/22 1152]  Enc Vitals Group     BP 111/71     Pulse Rate (!) 20     Resp 20     Temp 97.7 F (36.5 C)     Temp Source Axillary     SpO2 94 %     Weight      Height      Head Circumference      Peak Flow      Pain Score      Pain Loc      Pain Edu?      Excl. in [place]?    No data found.  Updated Vital Signs BP 111/71 (BP Location: Right Arm)   Pulse Marland Kitchen)  20   Temp 97.7 F (36.5 C) (Axillary)   Resp 20   SpO2 94%   Visual Acuity Right Eye Distance:   Left Eye Distance:   Bilateral Distance:    Right Eye Near:   Left Eye Near:    Bilateral Near:     Physical Exam Constitutional:      Appearance: Normal appearance.  Eyes:     Extraocular Movements: Extraocular movements intact.  Pulmonary:     Effort: Pulmonary effort is normal.  Skin:    General: Skin is warm and dry.     Comments: 3+ pitting edema present to the right lower EXTR, 2+ pitting edema present to the left lower extremity, ecchymosis extending from the right ankle to midway up the shin and the entirety of the right calf, tender to palpation, nondraining, ashen discoloration present from the midfoot to all 5 toes, 2+ dorsalis pedis pulse bilaterally, sensation will decreased is intact, able to bear weight bilaterally, able to complete range of motion bilaterally  Neurological:     Mental Status: She is alert and oriented to person, place, and time. Mental status is at baseline.      UC Treatments / Results  Labs (all labs ordered are listed, but only abnormal results are displayed) Labs Reviewed - No data to display  EKG   Radiology No results found.  Procedures Procedures (including critical care time)  Medications Ordered in UC Medications - No data to display  Initial Impression / Assessment and Plan / UC Course  I have reviewed the triage vital signs and the nursing notes.  Pertinent labs & imaging results that were available during my care of the patient  were reviewed by me and considered in my medical decision making (see chart for details).  Localized swelling of both lower legs  Increased edema is most likely related to injury as there is no significant erythema along with ecchymosis will provide coverage for bacteria, Keflex prescribed, increase torsemide 40 mg daily to 60 mg daily for 3 days, recommended use of compression stockings if tolerable, advised elevation and continue use of over-the-counter analgesics, given strict precautions that if symptoms continue to persist she is to follow-up with her primary doctor however if symptoms worsen she is to go to the nearest emergency department for immediate evaluation Final Clinical Impressions(s) / UC Diagnoses   Final diagnoses:  None   Discharge Instructions   None    ED Prescriptions   None    PDMP not reviewed this encounter.   Hans Eden, NP 06/15/22 [phone]

## 2022-06-14 NOTE — ED Triage Notes (Addendum)
Patient states she tripped while walking with a cane 6 days ago. Patient states she hit her head/face and knees. Patient has bruising and swelling to both lower legs and worse on the left. Patient denies taking any blood thinners.  Patient has a history of CHF. Patient is SOB in triage.

## 2022-06-14 NOTE — Discharge Instructions (Signed)
Increased swelling and discoloration is most likely related to recent fall, injury can worsen swelling as well as bruising however will provide coverage for bacteria to ensure that infection is not aiding to the symptoms  Begin Keflex every morning and every evening for 7 days  Additionally take 1 extra torsemide tablet for the next 3 days to further help reduce swelling  Elevate your legs when sitting and when lying to help with swelling  If you are able to tolerate you may use compression stockings  If symptoms continue to persist past use of medication please follow-up with primary doctor for reevaluation  At any point if symptoms worsen please complete a follow-up visit with primary doctor or with urgent care

## 2022-06-26 NOTE — Telephone Encounter (Signed)
Spoke with pt and advised letter has been completed and placed in mail.  See letter for complete details.  Pt verbalizes understanding and thanked Therapist, sports for the call.

## 2022-07-21 NOTE — Progress Notes (Signed)
Remote pacemaker transmission.   

## 2022-08-11 ENCOUNTER — Other Ambulatory Visit: Payer: Self-pay | Admitting: Internal Medicine

## 2022-09-07 NOTE — Patient Instructions (Incomplete)
[name] , Thank you for taking time to come for your Medicare Wellness Visit. I appreciate your ongoing commitment to your health goals. Please review the following plan we discussed and let me know if I can assist you in the future.   These are the goals we discussed:  Goals      Remain active and independent        This is a list of the screening recommended for you and due dates:  Health Maintenance  Topic Date Due   Zoster (Shingles) Vaccine (1 of 2) Never done   COVID-19 Vaccine (7 - 2023-24 season) 02/13/2022   Flu Shot  10/30/2022   Medicare Annual Wellness Visit  09/08/2023   DTaP/Tdap/Td vaccine (3 - Td or Tdap) 05/30/2027   Pneumonia Vaccine  Completed   DEXA scan (bone density measurement)  Completed   HPV Vaccine  Aged Out    Advanced directives: We have a copy of your advanced directives available in your record should your provider ever need to access them.   Conditions/risks identified: Aim for 30 minutes of exercise or brisk walking, 6-8 glasses of water, and 5 servings of fruits and vegetables each day.   Next appointment: Follow up in one year for your annual wellness visit    Preventive Care 65 Years and Older, Female Preventive care refers to lifestyle choices and visits with your health care provider that can promote health and wellness. What does preventive care include? A yearly physical exam. This is also called an annual well check. Dental exams once or twice a year. Routine eye exams. Ask your health care provider how often you should have your eyes checked. Personal lifestyle choices, including: Daily care of your teeth and gums. Regular physical activity. Eating a healthy diet. Avoiding tobacco and drug use. Limiting alcohol use. Practicing safe sex. Taking low-dose aspirin every day. Taking vitamin and mineral supplements as recommended by your health care provider. What happens during an annual well check? The services and screenings done  by your health care provider during your annual well check will depend on your age, overall health, lifestyle risk factors, and family history of disease. Counseling  Your health care provider may ask you questions about your: Alcohol use. Tobacco use. Drug use. Emotional well-being. Home and relationship well-being. Sexual activity. Eating habits. History of falls. Memory and ability to understand (cognition). Work and work Astronomer. Reproductive health. Screening  You may have the following tests or measurements: Height, weight, and BMI. Blood pressure. Lipid and cholesterol levels. These may be checked every 5 years, or more frequently if you are over 41 years old. Skin check. Lung cancer screening. You may have this screening every year starting at age 59 if you have a 30-pack-year history of smoking and currently smoke or have quit within the past 15 years. Fecal occult blood test (FOBT) of the stool. You may have this test every year starting at age 60. Flexible sigmoidoscopy or colonoscopy. You may have a sigmoidoscopy every 5 years or a colonoscopy every 10 years starting at age 56. Hepatitis C blood test. Hepatitis B blood test. Sexually transmitted disease (STD) testing. Diabetes screening. This is done by checking your blood sugar (glucose) after you have not eaten for a while (fasting). You may have this done every 1-3 years. Bone density scan. This is done to screen for osteoporosis. You may have this done starting at age 62. Mammogram. This may be done every 1-2 years. Talk to your health  care provider about how often you should have regular mammograms. Talk with your health care provider about your test results, treatment options, and if necessary, the need for more tests. Vaccines  Your health care provider may recommend certain vaccines, such as: Influenza vaccine. This is recommended every year. Tetanus, diphtheria, and acellular pertussis (Tdap, Td) vaccine. You  may need a Td booster every 10 years. Zoster vaccine. You may need this after age 9. Pneumococcal 13-valent conjugate (PCV13) vaccine. One dose is recommended after age 51. Pneumococcal polysaccharide (PPSV23) vaccine. One dose is recommended after age 34. Talk to your health care provider about which screenings and vaccines you need and how often you need them. This information is not intended to replace advice given to you by your health care provider. Make sure you discuss any questions you have with your health care provider. Document Released: 04/13/2015 Document Revised: 12/05/2015 Document Reviewed: 01/16/2015 Elsevier Interactive Patient Education  2017 [employer].  Fall Prevention in the Home Falls can cause injuries. They can happen to people of all ages. There are many things you can do to make your home safe and to help prevent falls. What can I do on the outside of my home? Regularly fix the edges of walkways and driveways and fix any cracks. Remove anything that might make you trip as you walk through a door, such as a raised step or threshold. Trim any bushes or trees on the path to your home. Use bright outdoor lighting. Clear any walking paths of anything that might make someone trip, such as rocks or tools. Regularly check to see if handrails are loose or broken. Make sure that both sides of any steps have handrails. Any raised decks and porches should have guardrails on the edges. Have any leaves, snow, or ice cleared regularly. Use sand or salt on walking paths during winter. Clean up any spills in your garage right away. This includes oil or grease spills. What can I do in the bathroom? Use night lights. Install grab bars by the toilet and in the tub and shower. Do not use towel bars as grab bars. Use non-skid mats or decals in the tub or shower. If you need to sit down in the shower, use a plastic, non-slip stool. Keep the floor dry. Clean up any water that spills  on the floor as soon as it happens. Remove soap buildup in the tub or shower regularly. Attach bath mats securely with double-sided non-slip rug tape. Do not have throw rugs and other things on the floor that can make you trip. What can I do in the bedroom? Use night lights. Make sure that you have a light by your bed that is easy to reach. Do not use any sheets or blankets that are too big for your bed. They should not hang down onto the floor. Have a firm chair that has side arms. You can use this for support while you get dressed. Do not have throw rugs and other things on the floor that can make you trip. What can I do in the kitchen? Clean up any spills right away. Avoid walking on wet floors. Keep items that you use a lot in easy-to-reach places. If you need to reach something above you, use a strong step stool that has a grab bar. Keep electrical cords out of the way. Do not use floor polish or wax that makes floors slippery. If you must use wax, use non-skid floor wax. Do not have throw  rugs and other things on the floor that can make you trip. What can I do with my stairs? Do not leave any items on the stairs. Make sure that there are handrails on both sides of the stairs and use them. Fix handrails that are broken or loose. Make sure that handrails are as long as the stairways. Check any carpeting to make sure that it is firmly attached to the stairs. Fix any carpet that is loose or worn. Avoid having throw rugs at the top or bottom of the stairs. If you do have throw rugs, attach them to the floor with carpet tape. Make sure that you have a light switch at the top of the stairs and the bottom of the stairs. If you do not have them, ask someone to add them for you. What else can I do to help prevent falls? Wear shoes that: Do not have high heels. Have rubber bottoms. Are comfortable and fit you well. Are closed at the toe. Do not wear sandals. If you use a stepladder: Make  sure that it is fully opened. Do not climb a closed stepladder. Make sure that both sides of the stepladder are locked into place. Ask someone to hold it for you, if possible. Clearly mark and make sure that you can see: Any grab bars or handrails. First and last steps. Where the edge of each step is. Use tools that help you move around (mobility aids) if they are needed. These include: Canes. Walkers. Scooters. Crutches. Turn on the lights when you go into a dark area. Replace any light bulbs as soon as they burn out. Set up your furniture so you have a clear path. Avoid moving your furniture around. If any of your floors are uneven, fix them. If there are any pets around you, be aware of where they are. Review your medicines with your doctor. Some medicines can make you feel dizzy. This can increase your chance of falling. Ask your doctor what other things that you can do to help prevent falls. This information is not intended to replace advice given to you by your health care provider. Make sure you discuss any questions you have with your health care provider. Document Released: 01/11/2009 Document Revised: 08/23/2015 Document Reviewed: 04/21/2014 Elsevier Interactive Patient Education  2017 [employer].

## 2022-09-07 NOTE — Progress Notes (Addendum)
Subjective:   [name] is a 89 y.o. female who presents for Medicare Annual (Subsequent) preventive examination.  I connected with  Lacretia Leigh on 09/08/22 by a audio enabled telemedicine application and verified that I am speaking with the correct person using two identifiers.  Patient Location: Home  Provider Location: Home Office  I discussed the limitations of evaluation and management by telemedicine. The patient expressed understanding and agreed to proceed.  Review of Systems     Cardiac Risk Factors include: advanced age (>26men, >68 women);dyslipidemia;hypertension     Objective:    Today's Vitals   09/08/22 0833  Weight: 92 lb (41.7 kg)  Height: 5\' 5"  (1.651 m)   Body mass index is 15.31 kg/m.     09/08/2022    8:39 AM 05/12/2022    9:55 AM 04/18/2022    8:26 AM 10/24/2021   12:41 PM 08/16/2019    5:57 AM 11/11/2018   12:50 PM 11/11/2018   11:09 AM  Advanced Directives  Does Patient Have a Medical Advance Directive? Yes No No No Yes Yes No  Type of Advance Directive Living will    Healthcare Power of [place];Living will Out of facility DNR (pink MOST or yellow form)   Does patient want to make changes to medical advance directive? No - Patient declined    No - Patient declined    Copy of Healthcare Power of Attorney in Chart?     Yes - validated most recent copy scanned in chart (See row information)    Would patient like information on creating a medical advance directive?   No - Patient declined   No - Patient declined No - Patient declined    Current Medications (verified) Outpatient Encounter Medications as of 09/08/2022  Medication Sig   acetaminophen (TYLENOL) 650 MG CR tablet Take 2 tablets (1,300 mg total) by mouth 2 (two) times daily as needed for pain.   B Complex-C (B-COMPLEX WITH VITAMIN C) tablet Take 1 tablet by mouth daily.   Carboxymethylcellulose Sodium (EYE DROPS OP) Place 1-2 drops into both eyes as needed (itching).   diclofenac Sodium  (VOLTAREN) 1 % GEL Apply 2 g topically 2 (two) times daily as needed (joint pains).   metoprolol succinate (TOPROL-XL) 25 MG 24 hr tablet Take 1 tablet (25 mg total) by mouth daily.   mirtazapine (REMERON) 15 MG tablet TAKE 1 TABLET(15 MG) BY MOUTH AT BEDTIME (Patient taking differently: Take 15 mg by mouth at bedtime.)   torsemide (DEMADEX) 20 MG tablet Take 1 tablet (20 mg total) by mouth daily.   torsemide (DEMADEX) 20 MG tablet TAKE 2 TABLETS(40 MG) BY MOUTH DAILY   No facility-administered encounter medications on file as of 09/08/2022.    Allergies (verified) Sulfamethoxazole   History: Past Medical History:  Diagnosis Date   Biventricular ICD (implantable cardioverter-defibrillator)-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 Asymptomatic.  Sees Dr Clarene Duke.   Peripheral neuropathy, hereditary/idiopathic 2012   Raynaud phenomenon    Sinus tachycardia    Systolic CHF (HCC)    Urge incontinence    Past Surgical History:  Procedure Laterality Date   ABDOMINAL HYSTERECTOMY  1970s   APPENDECTOMY     BI-VENTRICULAR PACEMAKER INSERTION N/A 03/28/2013   Procedure: BI-VENTRICULAR PACEMAKER INSERTION (CRT-P);  Surgeon: Duke Salvia, MD;  Location: [facility] CATH LAB;  Service: Cardiovascular;  Laterality: N/A;   Biventricular ICD Implant  06/28/2010; 03/28/2013   STJ CRTD implanted 2012 with dislodgement of LV lead 2014; 03/28/2013 CRTP placed with new LV lead by Dr Graciela Husbands   Bladder tack     CARDIAC CATHETERIZATION  03/2005   EF 25%, Normal arteries, Nonischemic CM   HIP ARTHROPLASTY Left 12/20/2013   Procedure: LEFT HIP HEMI ARTHROPLASTY;  Surgeon: Cammy Copa, MD;  Location: [facility] OR;   Service: Orthopedics;  Laterality: Left;   LEFT AND RIGHT HEART CATHETERIZATION WITH CORONARY ANGIOGRAM N/A 12/13/2012   Procedure: LEFT AND RIGHT HEART CATHETERIZATION WITH CORONARY ANGIOGRAM;  Surgeon: Micheline Chapman, MD;  Location[facility] C[facility] LAB;  Service: Cardiovascular;  Laterality: N/A;   NM MYOCAR PERF WALL MOTION  03/19/2010   EF 63%, low risk   PPM GENERATOR CHANGEOUT N/A 08/16/2019   Procedure: PPM GENERATOR CHANGEOUT;  Surgeon: Duke Salvia, MD;  Location: [facility] INVASIVE CV LAB;  Service: Cardiovascular;  Laterality: N/A;   Suprapubic Urethrovesicular Suspension  1992   TOTAL HIP ARTHROPLASTY Right 09/04/2014   Procedure: BIPOLAR HIP ARTHROPLASTY ANTERIOR APPROACH;  Surgeon: Kathryne Hitch, MD;  Location: [facility] OR;  Service: Orthopedics;  Laterality: Right;   US ECHOCARDIOGRAPHY  11/18/2011   EF 40%, LA moderately dilated, mild AI   Family History  Problem Relation Age of Onset   Heart disease Mother    Heart disease Father    Cancer Father        Kidney   Arthritis Sister    Heart disease Brother    Diabetes Son    Mental retardation Son    Alcohol abuse Sister    Cancer Brother    Cancer Daughter    Heart disease Daughter    Social History   Socioeconomic History   Marital status: Widowed    Spouse name: Not on file   Number of children: 4   Years of education: masters   Highest education level: Master's degree (e.g., MA, MS, MEng, MEd, MSW, MBA)  Occupational History   Occupation: Retired- Engineer, maintenance: RETIRED  Tobacco Use   Smoking status: Former    Packs/day: 1.00    Years: 15.00    Additional pack years: 0.00    Total pack years: 15.00    Types: Cigarettes    Quit date: 04/22/2002    Years since quitting: 20.3   Smokeless tobacco: Never  Vaping Use   Vaping Use: Never used  Substance and Sexual Activity   Alcohol use: No    Comment: Recovering Alcoholic 35 yrs ago. Quit 1979.   Drug use: No    Comment: Prescription drug addict  x 41 years.   Sexual activity: Not Currently  Other Topics Concern   Not on file  Social History Narrative   Health Care POA: Elliot Cousin (h) [phone] (c) [phone]   Bjorn Loser has copy of Living Will and last will and testament. Has made plans for body to be donated to [facility] for study.   Emergency Contact: Elliot Cousin   End of Life Plan: DNR/DNI   Who lives with you: Lives with son who is handicapped   Any pets: bird, [place]   Diet: Patient has a varied diet and does not eat fish or a lot  of meat. Has lost sense of taste and smell so only eats to sustain.   Exercise:  gardens, and cleans home   Seatbelts: Patient reports wearing seat belt when in vehicle.   Sun Exposure/Protection: Patient denies wearing sun protection    Hobbies: Reading, gardening, visiting family   Lives in single story home, no stairs, grab bars in bathroom, no throw rugs on floor. No firearms. Has smoke and CO2 detectors.      Widowed 4 children- son Gerlene Burdock lives with her (mild MR).       Retired, formerly was Programme researcher, broadcasting/film/video.  Recovered alcoholic, recovered prescription drug addict.  Quit 1979. Quit Smoking 2004.   Social Determinants of Health   Financial Resource Strain: Low Risk  (09/08/2022)   Overall Financial Resource Strain (CARDIA)    Difficulty of Paying Living Expenses: Not hard at all  Food Insecurity: No Food Insecurity (09/08/2022)   Hunger Vital Sign    Worried About Running Out of Food in the Last Year: Never true    Ran Out of Food in the Last Year: Never true  Transportation Needs: No Transportation Needs (09/08/2022)   PRAPARE - Administrator, Civil Service (Medical): No    Lack of Transportation (Non-Medical): No  Physical Activity: Sufficiently Active (09/08/2022)   Exercise Vital Sign    Days of Exercise per Week: 5 days    Minutes of Exercise per Session: 30 min  Stress: No Stress Concern Present (09/08/2022)   [employer] of Occupational Health  - Occupational Stress Questionnaire    Feeling of Stress : Not at all  Social Connections: Socially Isolated (09/08/2022)   Social Connection and Isolation Panel [NHANES]    Frequency of Communication with Friends and Family: More than three times a week    Frequency of Social Gatherings with Friends and Family: Three times a week    Attends Religious Services: Never    Active Member of Clubs or Organizations: No    Attends Banker Meetings: Never    Marital Status: Widowed    Tobacco Counseling Counseling given: Not Answered   Clinical Intake:  Pre-visit preparation completed: Yes  Pain : No/denies pain  Diabetes: No  How often do you need to have someone help you when you read instructions, pamphlets, or other written materials from your doctor or pharmacy?: 1 - Never  Diabetic?No   Interpreter Needed?: No  Information entered by :: Kandis Fantasia LPN   Activities of Daily Living    09/08/2022    8:39 AM 04/18/2022    5:00 PM  In your present state of health, do you have any difficulty performing the following activities:  Hearing? 0 1  Vision? 0 0  Difficulty concentrating or making decisions? 0 0  Walking or climbing stairs? 1 0  Dressing or bathing? 0 0  Doing errands, shopping? 0 0  Preparing Food and eating ? N   Using the Toilet? N   In the past six months, have you accidently leaked urine? Y   Do you have problems with loss of bowel control? N   Managing your Medications? N   Managing your Finances? N   Housekeeping or managing your Housekeeping? N     Patient Care Team: Latrelle Dodrill, MD as PCP - General (Family Medicine) Duke Salvia, MD as Consulting Physician (Cardiology)  Indicate any recent Medical Services you may have received from other than [facility] providers in the past year (date may be approximate).  Assessment:   This is a routine wellness examination for [name].  Hearing/Vision screen Hearing Screening -  Comments:: Denies hearing difficulties   Vision Screening - Comments:: up to date with routine eye exams    Dietary issues and exercise activities discussed: Current Exercise Habits: Home exercise routine, Type of exercise: walking;stretching, Time (Minutes): 30, Frequency (Times/Week): 5, Weekly Exercise (Minutes/Week): 150, Intensity: Mild   Goals Addressed               This Visit's Progress     COMPLETED: Exercise 4x per week (45) min per time) (pt-stated)        Patient has been doing this and hopes to continue doing this over next year.  Kinnie Feil, RN, BSN        COMPLETED: Patient Stated        Maintain current health.      Remain active and independent         Depression Screen    09/08/2022    8:48 AM 05/12/2022    9:57 AM 10/24/2021   12:41 PM 08/02/2020    8:52 AM 07/03/2020    9:11 AM 03/03/2019   10:00 AM 11/11/2018   11:08 AM  PHQ 2/9 Scores  PHQ - 2 Score 0 0 0 0 0 0 0  PHQ- 9 Score  5 0 13 9      Fall Risk    09/08/2022    8:38 AM 10/24/2021   12:41 PM 03/03/2019   10:00 AM 11/11/2018   11:08 AM 05/10/2018    3:15 PM  Fall Risk   Falls in the past year? 0 0 0 0 0  Number falls in past yr: 0  0 0   Injury with Fall? 0      Risk for fall due to : Impaired balance/gait;Impaired mobility      Follow up Falls prevention discussed;Education provided;Falls evaluation completed  Falls evaluation completed      FALL RISK PREVENTION PERTAINING TO THE HOME:  Any stairs in or around the home? No  If so, are there any without handrails? No  Home free of loose throw rugs in walkways, pet beds, electrical cords, etc? Yes  Adequate lighting in your home to reduce risk of falls? Yes   ASSISTIVE DEVICES UTILIZED TO PREVENT FALLS:  Life alert? No  Use of a cane, walker or w/c? Yes  Grab bars in the bathroom? Yes  Shower chair or bench in shower? No  Elevated toilet seat or a handicapped toilet? Yes   TIMED UP AND GO:  Was the test performed? No . Telephonic  visit   Cognitive Function:    12/24/2017    2:35 PM 01/10/2013   12:00 PM 08/20/2011   10:00 AM 07/22/2010   10:00 AM  MMSE - Mini Mental State Exam  Orientation to time 5 5 5 5   Orientation to Place 5 5 5 5   Registration 3 3 3 3   Attention/ Calculation 5 5 5 5   Recall 3 2 2 3   Language- name 2 objects 2 2 2 2   Language- repeat 1 1 1 1   Language- follow 3 step command 3 3 3 3   Language- read & follow direction 1 1 1 1   Write a sentence 1 1 1 1   Copy design 1 1 1 1   Total score 30 29 29 30         09/08/2022    8:39 AM 12/24/2017    2:36 PM  6CIT Screen  What Year? 0 points 0 points  What month? 0 points 0 points  What time? 0 points 0 points  Count back from 20 0 points 0 points  Months in reverse 0 points 0 points  Repeat phrase 0 points 0 points  Total Score 0 points 0 points    Immunizations Immunization History  Administered Date(s) Administered   H1N1 04/12/2008   Influenza Inj Mdck Quad With Preservative 01/01/2017   Influenza Split 12/27/2010, 12/12/2011   Influenza Whole 12/28/2008, 01/03/2010   Influenza, High Dose Seasonal PF 12/22/2020   Influenza,inj,Quad PF,6+ Mos 01/10/2013, 01/04/2016, 01/04/2019   Influenza,inj,Quad PF,6-35 Mos 01/01/2017   Influenza-Unspecified 01/13/2015, 12/29/2017, 12/19/2021   PFIZER Comirnaty(Gray Top)Covid-19 Tri-Sucrose Vaccine 07/03/2020   PFIZER(Purple Top)SARS-COV-2 Vaccination 04/21/2019, 05/12/2019, 12/28/2019, 12/19/2021   PPD Test 09/06/2014   Pfizer Covid-19 Vaccine Bivalent Booster 47yrs & up 12/22/2020   Pneumococcal Conjugate-13 07/07/2016, 11/24/2016   Pneumococcal Polysaccharide-23 05/29/1997   RSV,unspecified 01/29/2022   Td 11/29/2004   Tdap 05/29/2017    TDAP status: Up to date  Pneumococcal vaccine status: Up to date  Covid-19 vaccine status: Information provided on how to obtain vaccines.   Qualifies for Shingles Vaccine? Yes   Zostavax completed No   Shingrix Completed?: No.    Education has  been provided regarding the importance of this vaccine. Patient has been advised to call insurance company to determine out of pocket expense if they have not yet received this vaccine. Advised may also receive vaccine at local pharmacy or Health Dept. Verbalized acceptance and understanding.  Screening Tests Health Maintenance  Topic Date Due   Zoster Vaccines- Shingrix (1 of 2) Never done   COVID-19 Vaccine (7 - 2023-24 season) 02/13/2022   INFLUENZA VACCINE  10/30/2022   Medicare Annual Wellness (AWV)  09/08/2023   DTaP/Tdap/Td (3 - Td or Tdap) 05/30/2027   Pneumonia Vaccine 19+ Years old  Completed   DEXA SCAN  Completed   HPV VACCINES  Aged Out    Health Maintenance  Health Maintenance Due  Topic Date Due   Zoster Vaccines- Shingrix (1 of 2) Never done   COVID-19 Vaccine (7 - 2023-24 season) 02/13/2022    Colorectal cancer screening: No longer required.   Mammogram status: No longer required due to age and preference.  Bone Density status: Completed 05/22/14. Results reflect: Bone density results: OSTEOPOROSIS. Repeat every 2 years.  Lung Cancer Screening: (Low Dose CT Chest recommended if Age 59-80 years, 30 pack-year currently smoking OR have quit w/in 15years.) does not qualify.   Lung Cancer Screening Referral: n/a  Additional Screening:  Hepatitis C Screening: does not qualify;  Vision Screening: Recommended annual ophthalmology exams for early detection of glaucoma and other disorders of the eye. Is the patient up to date with their annual eye exam?  Yes  Who is the provider or what is the name of the office in which the patient attends annual eye exams? Dr. Laural Benes  If pt is not established with a provider, would they like to be referred to a provider to establish care? No .   Dental Screening: Recommended annual dental exams for proper oral hygiene  Community Resource Referral / Chronic Care Management: CRR required this visit?  No   CCM required this visit?   No      Plan:     I have personally reviewed and noted the following in the patient's chart:   Medical and social history Use of alcohol, tobacco or illicit drugs  Current medications and supplements  including opioid prescriptions. Patient is not currently taking opioid prescriptions. Functional ability and status Nutritional status Physical activity Advanced directives List of other physicians Hospitalizations, surgeries, and ER visits in previous 12 months Vitals Screenings to include cognitive, depression, and falls Referrals and appointments  In addition, I have reviewed and discussed with patient certain preventive protocols, quality metrics, and best practice recommendations. A written personalized care plan for preventive services as well as general preventive health recommendations were provided to patient.     Durwin Nora, [place]   1/61/0960   Due to this being a virtual visit, the after visit summary with patients personalized plan was offered to patient via mail or my-chart.  per request, patient was mailed a copy of AVS.  Nurse Notes: No concerns

## 2022-09-08 ENCOUNTER — Ambulatory Visit (INDEPENDENT_AMBULATORY_CARE_PROVIDER_SITE_OTHER): Payer: Medicare HMO

## 2022-09-08 VITALS — Ht 65.0 in | Wt 92.0 lb

## 2022-09-08 DIAGNOSIS — I428 Other cardiomyopathies: Secondary | ICD-10-CM

## 2022-09-08 DIAGNOSIS — Z0000 Encounter for general adult medical examination without abnormal findings: Secondary | ICD-10-CM | POA: Diagnosis not present

## 2022-09-09 LAB — CUP PACEART REMOTE DEVICE CHECK
Battery Remaining Longevity: 55 mo
Battery Remaining Percentage: 61 %
Battery Voltage: 2.98 V
Brady Statistic AP VP Percent: 2.2 %
Brady Statistic AP VS Percent: 1 %
Brady Statistic AS VP Percent: 89 %
Brady Statistic AS VS Percent: 2.5 %
Brady Statistic RA Percent Paced: 1 %
Date Time Interrogation Session: 20240610073014
Implantable Lead Connection Status: 753985
Implantable Lead Connection Status: 753985
Implantable Lead Connection Status: 753985
Implantable Lead Implant Date: 20070712
Implantable Lead Implant Date: 20070712
Implantable Lead Implant Date: 20141229
Implantable Lead Location: 753858
Implantable Lead Location: 753859
Implantable Lead Location: 753860
Implantable Lead Model: 7020
Implantable Pulse Generator Implant Date: 20210518
Lead Channel Impedance Value: 300 Ohm
Lead Channel Impedance Value: 430 Ohm
Lead Channel Impedance Value: 490 Ohm
Lead Channel Pacing Threshold Amplitude: 0.75 V
Lead Channel Pacing Threshold Amplitude: 0.75 V
Lead Channel Pacing Threshold Amplitude: 1.125 V
Lead Channel Pacing Threshold Pulse Width: 0.4 ms
Lead Channel Pacing Threshold Pulse Width: 0.4 ms
Lead Channel Pacing Threshold Pulse Width: 0.7 ms
Lead Channel Sensing Intrinsic Amplitude: 12 mV
Lead Channel Sensing Intrinsic Amplitude: 2.1 mV
Lead Channel Setting Pacing Amplitude: 2 V
Lead Channel Setting Pacing Amplitude: 2 V
Lead Channel Setting Pacing Amplitude: 2 V
Lead Channel Setting Pacing Pulse Width: 0.4 ms
Lead Channel Setting Pacing Pulse Width: 0.7 ms
Lead Channel Setting Sensing Sensitivity: 2 mV
Pulse Gen Model: 3562
Pulse Gen Serial Number: 3818460

## 2022-09-30 ENCOUNTER — Other Ambulatory Visit: Payer: Self-pay | Admitting: Family Medicine

## 2022-10-01 NOTE — Progress Notes (Signed)
Remote pacemaker transmission.   

## 2022-12-08 ENCOUNTER — Ambulatory Visit: Payer: Medicare HMO

## 2022-12-08 DIAGNOSIS — I5022 Chronic systolic (congestive) heart failure: Secondary | ICD-10-CM

## 2022-12-08 DIAGNOSIS — I428 Other cardiomyopathies: Secondary | ICD-10-CM

## 2022-12-09 LAB — CUP PACEART REMOTE DEVICE CHECK
Battery Remaining Longevity: 52 mo
Battery Remaining Percentage: 57 %
Battery Voltage: 2.98 V
Brady Statistic AP VP Percent: 2.3 %
Brady Statistic AP VS Percent: 1 %
Brady Statistic AS VP Percent: 89 %
Brady Statistic AS VS Percent: 2.5 %
Brady Statistic RA Percent Paced: 1 %
Date Time Interrogation Session: 20240909081419
Implantable Lead Connection Status: 753985
Implantable Lead Connection Status: 753985
Implantable Lead Connection Status: 753985
Implantable Lead Implant Date: 20070712
Implantable Lead Implant Date: 20070712
Implantable Lead Implant Date: 20141229
Implantable Lead Location: 753858
Implantable Lead Location: 753859
Implantable Lead Location: 753860
Implantable Lead Model: 7020
Implantable Pulse Generator Implant Date: 20210518
Lead Channel Impedance Value: 290 Ohm
Lead Channel Impedance Value: 400 Ohm
Lead Channel Impedance Value: 450 Ohm
Lead Channel Pacing Threshold Amplitude: 0.75 V
Lead Channel Pacing Threshold Amplitude: 0.75 V
Lead Channel Pacing Threshold Amplitude: 1.125 V
Lead Channel Pacing Threshold Pulse Width: 0.4 ms
Lead Channel Pacing Threshold Pulse Width: 0.4 ms
Lead Channel Pacing Threshold Pulse Width: 0.7 ms
Lead Channel Sensing Intrinsic Amplitude: 0.7 mV
Lead Channel Sensing Intrinsic Amplitude: 12 mV
Lead Channel Setting Pacing Amplitude: 2 V
Lead Channel Setting Pacing Amplitude: 2 V
Lead Channel Setting Pacing Amplitude: 2 V
Lead Channel Setting Pacing Pulse Width: 0.4 ms
Lead Channel Setting Pacing Pulse Width: 0.7 ms
Lead Channel Setting Sensing Sensitivity: 2 mV
Pulse Gen Model: 3562
Pulse Gen Serial Number: 3818460

## 2022-12-25 NOTE — Progress Notes (Signed)
Remote pacemaker transmission.   

## 2023-03-03 ENCOUNTER — Ambulatory Visit (INDEPENDENT_AMBULATORY_CARE_PROVIDER_SITE_OTHER): Payer: Medicare HMO

## 2023-03-03 DIAGNOSIS — Z23 Encounter for immunization: Secondary | ICD-10-CM

## 2023-03-03 NOTE — Progress Notes (Signed)
Patient presents to nurse clinic for flu and COVID vaccinations. Administered vaccinations with no complications.   Monitored post injection, no signs of adverse reaction.   Veronda Prude, RN

## 2023-03-09 ENCOUNTER — Ambulatory Visit: Payer: Medicare HMO

## 2023-03-09 DIAGNOSIS — I428 Other cardiomyopathies: Secondary | ICD-10-CM | POA: Diagnosis not present

## 2023-03-09 DIAGNOSIS — I5022 Chronic systolic (congestive) heart failure: Secondary | ICD-10-CM

## 2023-03-10 LAB — CUP PACEART REMOTE DEVICE CHECK
Battery Remaining Longevity: 49 mo
Battery Remaining Percentage: 54 %
Battery Voltage: 2.98 V
Brady Statistic AP VP Percent: 2.2 %
Brady Statistic AP VS Percent: 1 %
Brady Statistic AS VP Percent: 89 %
Brady Statistic AS VS Percent: 2.5 %
Brady Statistic RA Percent Paced: 1 %
Date Time Interrogation Session: 20241210075414
Implantable Lead Connection Status: 753985
Implantable Lead Connection Status: 753985
Implantable Lead Connection Status: 753985
Implantable Lead Implant Date: 20070712
Implantable Lead Implant Date: 20070712
Implantable Lead Implant Date: 20141229
Implantable Lead Location: 753858
Implantable Lead Location: 753859
Implantable Lead Location: 753860
Implantable Lead Model: 7020
Implantable Pulse Generator Implant Date: 20210518
Lead Channel Impedance Value: 290 Ohm
Lead Channel Impedance Value: 410 Ohm
Lead Channel Impedance Value: 430 Ohm
Lead Channel Pacing Threshold Amplitude: 0.75 V
Lead Channel Pacing Threshold Amplitude: 0.875 V
Lead Channel Pacing Threshold Amplitude: 1.25 V
Lead Channel Pacing Threshold Pulse Width: 0.4 ms
Lead Channel Pacing Threshold Pulse Width: 0.4 ms
Lead Channel Pacing Threshold Pulse Width: 0.7 ms
Lead Channel Sensing Intrinsic Amplitude: 0.5 mV
Lead Channel Sensing Intrinsic Amplitude: 8.3 mV
Lead Channel Setting Pacing Amplitude: 2 V
Lead Channel Setting Pacing Amplitude: 2 V
Lead Channel Setting Pacing Amplitude: 2 V
Lead Channel Setting Pacing Pulse Width: 0.4 ms
Lead Channel Setting Pacing Pulse Width: 0.7 ms
Lead Channel Setting Sensing Sensitivity: 2 mV
Pulse Gen Model: 3562
Pulse Gen Serial Number: 3818460

## 2023-04-16 NOTE — Addendum Note (Signed)
Addended by: Geralyn Flash D on: 04/16/2023 03:01 PM   Modules accepted: Orders

## 2023-04-16 NOTE — Progress Notes (Signed)
Remote pacemaker transmission.   

## 2023-04-23 ENCOUNTER — Ambulatory Visit: Payer: Medicare HMO | Admitting: Student

## 2023-04-23 ENCOUNTER — Telehealth: Payer: Self-pay

## 2023-04-23 VITALS — BP 123/80 | HR 90 | Ht 65.0 in | Wt 93.0 lb

## 2023-04-23 DIAGNOSIS — M7989 Other specified soft tissue disorders: Secondary | ICD-10-CM

## 2023-04-23 NOTE — Progress Notes (Signed)
  SUBJECTIVE:   CHIEF COMPLAINT / HPI:   Leg swelling Swelling started 4-5 days ago, but has happen in the past before, about 2 years ago. Said she got abx for it. Has been taking her meds regularly, breathing is fine, otherwise feeling normal state of health.    PERTINENT  PMH / PSH:    OBJECTIVE:  BP 123/80   Pulse 90   Ht 5\' 5"  (1.651 m)   Wt 93 lb (42.2 kg)   SpO2 99%   BMI 15.48 kg/m  Physical Exam Musculoskeletal:     Right lower leg: Tenderness present. No bony tenderness. 3+ Pitting Edema present.     Left lower leg: Tenderness present. No bony tenderness. 3+ Pitting Edema present.       ASSESSMENT/PLAN:   Assessment & Plan Leg swelling Patient comes in with 4 to 5-day history of leg swelling.  Patient appreciates that her legs have been swelling, draining, tender to touch, and feet have been darker than usual.  Patient reports she takes all her heart failure medications regularly, and breathing is fine.  Patient otherwise in normal state of health.  Bilateral legs appear edematous, with 3+ edema, and dusky color to feet.  Unable to palpate pedal pulse however patient able to wiggle toes, and has some sensation (decreased from peripheral neuropathy).  Suspect this is likely worsening of chronic venous insufficiency versus heart failure exacerbation.  Lower concern for heart failure exacerbation given history of taking her medication, and normal breathing status.  Will recommend patient elevate feet, take extra dose of diuretic, and with close follow-up next week Monday. - Extra dose of torsemide x 1 - Elevate feet - Follow-up Monday morning No follow-ups on file. Bess Kinds, MD 04/23/2023, 12:26 PM PGY-3, [facility]

## 2023-04-23 NOTE — Patient Instructions (Signed)
It was great to see you! Thank you for allowing me to participate in your care!  I recommend that you always bring your medications to each appointment as this makes it easy to ensure we are on the correct medications and helps [place] not miss when refills are needed.  Our plans for today:  - Leg Swelling This looks to be from chronic circulation problems, causing your feet to swell up. I recommend elevating your feet when sitting down, and can try an extra dose of torsemide to see if this helps with the fluid.    We will want you to follow up early next week.    Take care and seek immediate care sooner if you develop any concerns.   Dr. Bess Kinds, MD [facility]

## 2023-04-23 NOTE — Assessment & Plan Note (Addendum)
Patient comes in with 4 to 5-day history of leg swelling.  Patient appreciates that her legs have been swelling, draining, tender to touch, and feet have been darker than usual.  Patient reports she takes all her heart failure medications regularly, and breathing is fine.  Patient otherwise in normal state of health.  Bilateral legs appear edematous, with 3+ edema, and dusky color to feet.  Unable to palpate pedal pulse however patient able to wiggle toes, and has some sensation (decreased from peripheral neuropathy).  Suspect this is likely worsening of chronic venous insufficiency versus heart failure exacerbation.  Lower concern for heart failure exacerbation given history of taking her medication, and normal breathing status.  Will recommend patient elevate feet, take extra dose of diuretic, and with close follow-up next week Monday. - Extra dose of torsemide x 1 - Elevate feet - Follow-up Monday morning

## 2023-04-23 NOTE — Telephone Encounter (Signed)
Patient calls nurse line reporting LE swelling.   She reports both LE are swollen and painful. She reports clear fluid leakage. She denies any odors or fevers. She denies any shortness of breath or chest pains. She reports she noticed the swelling "a couple of days ago" however the swelling is worse today.   Patient scheduled for this morning in [facility] for evaluation.    Precautions discussed in the meantime.

## 2023-04-27 ENCOUNTER — Ambulatory Visit: Payer: Medicare HMO | Admitting: Family Medicine

## 2023-04-27 VITALS — BP 110/74 | HR 94 | Ht 65.0 in | Wt 92.5 lb

## 2023-04-27 DIAGNOSIS — M7989 Other specified soft tissue disorders: Secondary | ICD-10-CM

## 2023-04-27 NOTE — Progress Notes (Signed)
    SUBJECTIVE:   CHIEF COMPLAINT / HPI:   Seen 1/23 for leg swelling At that time was advised to take an extra dose of torsemide daily and use compression stockings, elevation Patient has been following these instructions aside from using compression stockings that she prefers not to use them. She is willing to give it a try Reports significant improvement in her leg swelling  Denies any shortness of breath, chest pain, fevers  PERTINENT  PMH / PSH: CHF, PAF, hypertension  OBJECTIVE:   BP 110/74   Pulse 94   Ht 5\' 5"  (1.651 m)   Wt 92 lb 8 oz (42 kg)   SpO2 99%   BMI 15.39 kg/m   General: NAD, pleasant, able to participate in exam Cardiac: RRR, no murmurs auscultated Respiratory: CTAB, normal WOB, no crackles Abdomen: soft, non-tender, non-distended Extremities: Bilateral lower extremities with 2+ pitting edema at the ankles and into her feet.  Somewhat tender to palpation.  No calf tenderness.  Does appear improved from prior examination 4 days ago.  Some overlying erythema but color also appears improved from prior.  Neurovascularly intact distally with normal capillary refill and sensation in her toes.  Not significantly warm to touch.    ASSESSMENT/PLAN:   Assessment & Plan Leg swelling Likely secondary to venous insufficiency versus heart failure.  She is not in any respiratory distress and her lung exam is normal.  Low suspicion for cellulitis.  Has been using an extra dose of torsemide daily with improvement.  Advised to continue this for the next 2 to 3 days then return back to normal dose of torsemide.  Advised compression stocking use which patient agrees to.  Advised close follow-up with cardiology and discussed return precautions.   Vonna Drafts, MD [facility]

## 2023-04-27 NOTE — Assessment & Plan Note (Signed)
Likely secondary to venous insufficiency versus heart failure.  She is not in any respiratory distress and her lung exam is normal.  Low suspicion for cellulitis.  Has been using an extra dose of torsemide daily with improvement.  Advised to continue this for the next 2 to 3 days then return back to normal dose of torsemide.  Advised compression stocking use which patient agrees to.  Advised close follow-up with cardiology and discussed return precautions.

## 2023-04-27 NOTE — Patient Instructions (Signed)
Continue taking your increased dose of torsemide for 2-3 more days  Then go back to your previous dose of torsemide  Try to follow-up with your cardiologist sooner rather than later

## 2023-05-12 ENCOUNTER — Telehealth: Payer: Self-pay

## 2023-05-12 ENCOUNTER — Emergency Department (HOSPITAL_COMMUNITY): Payer: Medicare HMO

## 2023-05-12 ENCOUNTER — Inpatient Hospital Stay (HOSPITAL_COMMUNITY)
Admission: EM | Admit: 2023-05-12 | Discharge: 2023-05-30 | DRG: 308 | Disposition: E | Payer: Medicare HMO | Attending: Family Medicine | Admitting: Family Medicine

## 2023-05-12 ENCOUNTER — Encounter (HOSPITAL_COMMUNITY): Payer: Self-pay | Admitting: Emergency Medicine

## 2023-05-12 DIAGNOSIS — Z96643 Presence of artificial hip joint, bilateral: Secondary | ICD-10-CM | POA: Diagnosis present

## 2023-05-12 DIAGNOSIS — Z7189 Other specified counseling: Secondary | ICD-10-CM | POA: Diagnosis not present

## 2023-05-12 DIAGNOSIS — Z90710 Acquired absence of both cervix and uterus: Secondary | ICD-10-CM

## 2023-05-12 DIAGNOSIS — R680 Hypothermia, not associated with low environmental temperature: Secondary | ICD-10-CM | POA: Diagnosis present

## 2023-05-12 DIAGNOSIS — E872 Acidosis, unspecified: Secondary | ICD-10-CM | POA: Diagnosis present

## 2023-05-12 DIAGNOSIS — R579 Shock, unspecified: Secondary | ICD-10-CM | POA: Diagnosis present

## 2023-05-12 DIAGNOSIS — R197 Diarrhea, unspecified: Secondary | ICD-10-CM | POA: Diagnosis present

## 2023-05-12 DIAGNOSIS — I110 Hypertensive heart disease with heart failure: Secondary | ICD-10-CM | POA: Diagnosis present

## 2023-05-12 DIAGNOSIS — K7200 Acute and subacute hepatic failure without coma: Secondary | ICD-10-CM | POA: Diagnosis present

## 2023-05-12 DIAGNOSIS — E785 Hyperlipidemia, unspecified: Secondary | ICD-10-CM | POA: Diagnosis present

## 2023-05-12 DIAGNOSIS — I872 Venous insufficiency (chronic) (peripheral): Secondary | ICD-10-CM | POA: Diagnosis present

## 2023-05-12 DIAGNOSIS — M810 Age-related osteoporosis without current pathological fracture: Secondary | ICD-10-CM | POA: Diagnosis present

## 2023-05-12 DIAGNOSIS — R54 Age-related physical debility: Secondary | ICD-10-CM | POA: Diagnosis present

## 2023-05-12 DIAGNOSIS — Z95810 Presence of automatic (implantable) cardiac defibrillator: Secondary | ICD-10-CM

## 2023-05-12 DIAGNOSIS — R0682 Tachypnea, not elsewhere classified: Secondary | ICD-10-CM

## 2023-05-12 DIAGNOSIS — G608 Other hereditary and idiopathic neuropathies: Secondary | ICD-10-CM | POA: Diagnosis present

## 2023-05-12 DIAGNOSIS — J439 Emphysema, unspecified: Secondary | ICD-10-CM | POA: Diagnosis present

## 2023-05-12 DIAGNOSIS — N3941 Urge incontinence: Secondary | ICD-10-CM | POA: Diagnosis present

## 2023-05-12 DIAGNOSIS — I480 Paroxysmal atrial fibrillation: Secondary | ICD-10-CM | POA: Diagnosis present

## 2023-05-12 DIAGNOSIS — N179 Acute kidney failure, unspecified: Secondary | ICD-10-CM

## 2023-05-12 DIAGNOSIS — F1021 Alcohol dependence, in remission: Secondary | ICD-10-CM | POA: Diagnosis present

## 2023-05-12 DIAGNOSIS — D72829 Elevated white blood cell count, unspecified: Secondary | ICD-10-CM | POA: Diagnosis present

## 2023-05-12 DIAGNOSIS — Z810 Family history of intellectual disabilities: Secondary | ICD-10-CM

## 2023-05-12 DIAGNOSIS — Z79899 Other long term (current) drug therapy: Secondary | ICD-10-CM

## 2023-05-12 DIAGNOSIS — J9601 Acute respiratory failure with hypoxia: Secondary | ICD-10-CM | POA: Diagnosis present

## 2023-05-12 DIAGNOSIS — Z833 Family history of diabetes mellitus: Secondary | ICD-10-CM

## 2023-05-12 DIAGNOSIS — Z66 Do not resuscitate: Secondary | ICD-10-CM | POA: Diagnosis present

## 2023-05-12 DIAGNOSIS — Z1152 Encounter for screening for COVID-19: Secondary | ICD-10-CM

## 2023-05-12 DIAGNOSIS — Z882 Allergy status to sulfonamides status: Secondary | ICD-10-CM

## 2023-05-12 DIAGNOSIS — I7300 Raynaud's syndrome without gangrene: Secondary | ICD-10-CM | POA: Diagnosis present

## 2023-05-12 DIAGNOSIS — I2489 Other forms of acute ischemic heart disease: Secondary | ICD-10-CM | POA: Diagnosis present

## 2023-05-12 DIAGNOSIS — R64 Cachexia: Secondary | ICD-10-CM | POA: Diagnosis present

## 2023-05-12 DIAGNOSIS — I4891 Unspecified atrial fibrillation: Principal | ICD-10-CM

## 2023-05-12 DIAGNOSIS — R0602 Shortness of breath: Secondary | ICD-10-CM | POA: Diagnosis present

## 2023-05-12 DIAGNOSIS — R451 Restlessness and agitation: Secondary | ICD-10-CM | POA: Diagnosis present

## 2023-05-12 DIAGNOSIS — I428 Other cardiomyopathies: Secondary | ICD-10-CM | POA: Diagnosis present

## 2023-05-12 DIAGNOSIS — Z8261 Family history of arthritis: Secondary | ICD-10-CM

## 2023-05-12 DIAGNOSIS — Z87891 Personal history of nicotine dependence: Secondary | ICD-10-CM

## 2023-05-12 DIAGNOSIS — I5023 Acute on chronic systolic (congestive) heart failure: Secondary | ICD-10-CM | POA: Diagnosis present

## 2023-05-12 DIAGNOSIS — E860 Dehydration: Secondary | ICD-10-CM | POA: Diagnosis present

## 2023-05-12 DIAGNOSIS — Z8249 Family history of ischemic heart disease and other diseases of the circulatory system: Secondary | ICD-10-CM

## 2023-05-12 LAB — CBC WITH DIFFERENTIAL/PLATELET
Abs Immature Granulocytes: 0.49 10*3/uL — ABNORMAL HIGH (ref 0.00–0.07)
Basophils Absolute: 0 10*3/uL (ref 0.0–0.1)
Basophils Relative: 0 %
Eosinophils Absolute: 0.1 10*3/uL (ref 0.0–0.5)
Eosinophils Relative: 0 %
HCT: 49.3 % — ABNORMAL HIGH (ref 36.0–46.0)
Hemoglobin: 16.2 g/dL — ABNORMAL HIGH (ref 12.0–15.0)
Immature Granulocytes: 3 %
Lymphocytes Relative: 5 %
Lymphs Abs: 0.8 10*3/uL (ref 0.7–4.0)
MCH: 34.9 pg — ABNORMAL HIGH (ref 26.0–34.0)
MCHC: 32.9 g/dL (ref 30.0–36.0)
MCV: 106.3 fL — ABNORMAL HIGH (ref 80.0–100.0)
Monocytes Absolute: 1.3 10*3/uL — ABNORMAL HIGH (ref 0.1–1.0)
Monocytes Relative: 8 %
Neutro Abs: 14.1 10*3/uL — ABNORMAL HIGH (ref 1.7–7.7)
Neutrophils Relative %: 84 %
Platelets: 135 10*3/uL — ABNORMAL LOW (ref 150–400)
RBC: 4.64 MIL/uL (ref 3.87–5.11)
RDW: 16.5 % — ABNORMAL HIGH (ref 11.5–15.5)
WBC: 16.7 10*3/uL — ABNORMAL HIGH (ref 4.0–10.5)
nRBC: 0.3 % — ABNORMAL HIGH (ref 0.0–0.2)

## 2023-05-12 LAB — COMPREHENSIVE METABOLIC PANEL
ALT: 859 U/L — ABNORMAL HIGH (ref 0–44)
AST: 1477 U/L — ABNORMAL HIGH (ref 15–41)
Albumin: 4 g/dL (ref 3.5–5.0)
Alkaline Phosphatase: 62 U/L (ref 38–126)
Anion gap: 32 — ABNORMAL HIGH (ref 5–15)
BUN: 42 mg/dL — ABNORMAL HIGH (ref 8–23)
CO2: 12 mmol/L — ABNORMAL LOW (ref 22–32)
Calcium: 9.7 mg/dL (ref 8.9–10.3)
Chloride: 90 mmol/L — ABNORMAL LOW (ref 98–111)
Creatinine, Ser: 2.62 mg/dL — ABNORMAL HIGH (ref 0.44–1.00)
GFR, Estimated: 17 mL/min — ABNORMAL LOW (ref >60)
Glucose, Bld: 84 mg/dL (ref 70–99)
Potassium: 4.2 mmol/L (ref 3.5–5.1)
Sodium: 134 mmol/L — ABNORMAL LOW (ref 135–145)
Total Bilirubin: 3.6 mg/dL — ABNORMAL HIGH (ref 0.0–1.2)
Total Protein: 6.5 g/dL (ref 6.5–8.1)

## 2023-05-12 LAB — RESP PANEL BY RT-PCR (RSV, FLU A&B, COVID)  RVPGX2
Influenza A by PCR: NEGATIVE
Influenza B by PCR: NEGATIVE
Resp Syncytial Virus by PCR: NEGATIVE
SARS Coronavirus 2 by RT PCR: NEGATIVE

## 2023-05-12 LAB — TROPONIN I (HIGH SENSITIVITY): Troponin I (High Sensitivity): 267 ng/L (ref <18)

## 2023-05-12 LAB — BRAIN NATRIURETIC PEPTIDE: B Natriuretic Peptide: >4500 pg/mL — ABNORMAL HIGH (ref 0.0–100.0)

## 2023-05-12 MED ORDER — HEPARIN BOLUS VIA INFUSION
1500.0000 [IU] | Freq: Once | INTRAVENOUS | Status: DC
  Filled 2023-05-12: qty 1500

## 2023-05-12 MED ORDER — SODIUM CHLORIDE 0.9 % IV BOLUS
500.0000 mL | Freq: Once | INTRAVENOUS | Status: AC
  Administered 2023-05-12: 500 mL via INTRAVENOUS

## 2023-05-12 MED ORDER — HEPARIN (PORCINE) 25000 UT/250ML-% IV SOLN: 500.0000 [IU]/h | INTRAVENOUS | Status: DC

## 2023-05-12 MED ORDER — AMIODARONE HCL IN DEXTROSE 360-4.14 MG/200ML-% IV SOLN
60.0000 mg/h | INTRAVENOUS | Status: DC
  Administered 2023-05-12: 60 mg/h via INTRAVENOUS
  Filled 2023-05-12: qty 200

## 2023-05-12 MED ORDER — AMIODARONE LOAD VIA INFUSION
150.0000 mg | Freq: Once | INTRAVENOUS | Status: AC
  Administered 2023-05-12: 150 mg via INTRAVENOUS
  Filled 2023-05-12: qty 83.34

## 2023-05-12 MED ORDER — AMIODARONE HCL IN DEXTROSE 360-4.14 MG/200ML-% IV SOLN: 30.0000 mg/h | INTRAVENOUS | Status: DC

## 2023-05-13 NOTE — Telephone Encounter (Signed)
I marked the pt deceased in [place] and took her out of [place].

## 2023-05-17 LAB — CULTURE, BLOOD (ROUTINE X 2): Culture: NO GROWTH

## 2023-05-30 NOTE — Consult Note (Addendum)
Cardiology Consultation   Patient ID: ASJAH RAUDA MRN: [id number]; DOB: [birth year]  Admit date: 05/26/2023 Date of Consult: 05/18/2023  PCP:  Latrelle Dodrill, MD   [facility] Providers Cardiologist:  None        Patient Profile:   [name] is a 89 y.o. female with a hx of NICM (prior [facility] 2007, 2014 without CAD), s/p BiV ICD in 2007 and 2012, s/p CRTP upgrade 2014, hyperlipidemia, hypertension, paroxysmal atrial fibrillation who is being seen 05/25/2023 for the evaluation of acute on chronic CHF and afib with RVR at the request of Dr. Rubin Payor.  History of Present Illness:   [name] was brought in by EMS today after waking up with acute dyspnea around 2:30am. In triage, HR up to 160s with patient visibly tachypneic.   On exam, patient is pale and ill appearing with respiratory rate near 60. Patient's daughter and son are at bedside. History obtained from both daughter (who patient lives with) and patient. Daughter reports noticing a significant increase in work of breath, respiratory distress yesterday. Symptoms continued to progress and ultimately this is what prompted call for EMS. Patient a bit vague about her symptoms, says that she has chronic chest discomfort and seemingly denies exacerbation beyond baseline. Her primary concern today is feeling so short of breath. I asked about oral intake and both patient and daughter admit it has not been the best. Patient also with recent diarrhea and nausea (no vomiting). She denies noticing any exacerbation of dyspnea until yesterday. Denies known exposure to illness and does not report fevers or chills.   Review of recent notes shows that patient was seen in family medicine clinic on 1/23 and again on 1/27 for evaluation of lower extremity edema. On 1/23, notes report "3+ bilateral pitting edema and ducky color to feet." Pedal pulses not palpable. Patient denied dyspnea at the time and symptoms attributed to worsening  venous insufficiency. She was given an extra dose of torsemide. In close follow up, patient reported significant improvement in leg swelling and again denied shortness of breath, chest pain. Per notes, no adventitious breath sounds.   In the ED, initial labs show BNP >4500 (prior level 1 year ago was 2061). CBC with leukocytosis to 16.7. CXR with cardiomegaly. CMP with AST 1477, ALT 859, tbili 3.6. Troponin 267. CXR with emphysema, cardiomegaly.    Past Medical History:  Diagnosis Date   Biventricular ICD (implantable cardioverter-defibrillator)-[employer]. 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 Asymptomatic.  Sees Dr Clarene Duke.   Peripheral neuropathy, hereditary/idiopathic 2012   Raynaud phenomenon    Sinus tachycardia    Systolic CHF [facility])    Urge incontinence     Past Surgical History:  Procedure Laterality Date   ABDOMINAL HYSTERECTOMY  1970s   APPENDECTOMY     BI-VENTRICULAR PACEMAKER INSERTION N/A 03/28/2013   Procedure: BI-VENTRICULAR PACEMAKER INSERTION (CRT-P);  Surgeon: Duke Salvia, MD;  Location: [facility] CATH LAB;  Service: Cardiovascular;  Laterality: N/A;   Biventricular ICD Implant  06/28/2010; 03/28/2013   STJ CRTD implanted 2012 with dislodgement of LV lead 2014; 03/28/2013 CRTP placed with new LV lead by Dr  Klein   Bladder tack     CARDIAC CATHETERIZATION  03/2005   EF 25%, Normal arteries, Nonischemic CM   HIP ARTHROPLASTY Left 12/20/2013   Procedure: LEFT HIP HEMI ARTHROPLASTY;  Surgeon: Cammy Copa, MD;  Location: [facility] OR;  Service: Orthopedics;  Laterality: Left;   LEFT AND RIGHT HEART CATHETERIZATION WITH CORONARY ANGIOGRAM N/A 12/13/2012   Procedure: LEFT AND RIGHT HEART  CATHETERIZATION WITH CORONARY ANGIOGRAM;  Surgeon: Micheline Chapman, MD;  Location: [facility] CATH LAB;  Service: Cardiovascular;  Laterality: N/A;   NM MYOCAR PERF WALL MOTION  03/19/2010   EF 63%, low risk   PPM GENERATOR CHANGEOUT N/A 08/16/2019   Procedure: PPM GENERATOR CHANGEOUT;  Surgeon: Duke Salvia, MD;  Location: [facility] INVASIVE CV LAB;  Service: Cardiovascular;  Laterality: N/A;   Suprapubic Urethrovesicular Suspension  1992   TOTAL HIP ARTHROPLASTY Right 09/04/2014   Procedure: BIPOLAR HIP ARTHROPLASTY ANTERIOR APPROACH;  Surgeon: Kathryne Hitch, MD;  Location: [facility] OR;  Service: Orthopedics;  Laterality: Right;   US ECHOCARDIOGRAPHY  11/18/2011   EF 40%, LA moderately dilated, mild AI     Home Medications:  Prior to Admission medications   Medication Sig Start Date End Date Taking? Authorizing Provider  acetaminophen (TYLENOL) 650 MG CR tablet Take 2 tablets (1,300 mg total) by mouth 2 (two) times daily as needed for pain. 04/21/22  Yes Rolly Salter, MD  B Complex-C (B-COMPLEX WITH VITAMIN C) tablet Take 1 tablet by mouth daily. 04/22/22  Yes Rolly Salter, MD  Carboxymethylcellulose Sodium (EYE DROPS OP) Place 1-2 drops into both eyes as needed (itching).   Yes [provider]  diclofenac Sodium (VOLTAREN) 1 % GEL Apply 2 g topically 2 (two) times daily as needed (joint pains). 04/21/22  Yes Rolly Salter, MD  metoprolol succinate (TOPROL-XL) 25 MG 24 hr tablet Take 1 tablet (25 mg total) by mouth daily. 05/21/22  Yes Duke Salvia, MD  mirtazapine (REMERON) 15 MG tablet Take 1 tablet (15 mg total) by mouth at bedtime. 09/30/22  Yes Latrelle Dodrill, MD  torsemide (DEMADEX) 20 MG tablet TAKE 2 TABLETS(40 MG) BY MOUTH DAILY 08/12/22  Yes Duke Salvia, MD    Inpatient Medications: Scheduled Meds:  amiodarone  150 mg Intravenous Once   Continuous Infusions:  amiodarone     Followed by   amiodarone     PRN Meds:   Allergies:    Allergies  Allergen  Reactions   Sulfamethoxazole Anaphylaxis, Shortness Of Breath and Swelling    Social History:   Social History   Socioeconomic History   Marital status: Widowed    Spouse name: Not on file   Number of children: 4   Years of education: masters   Highest education level: Master's degree (e.g., MA, MS, MEng, MEd, MSW, MBA)  Occupational History   Occupation: Retired- Engineer, maintenance: RETIRED  Tobacco Use   Smoking status: Former    Current packs/day: 0.00    Average packs/day: 1 pack/day for 15.0 years (15.0 ttl pk-yrs)    Types: Cigarettes    Start date: 04/23/1987    Quit date: 04/22/2002    Years since quitting: 21.0   Smokeless tobacco: Never  Vaping Use   Vaping status: Never Used  Substance and Sexual Activity   Alcohol use: No    Comment: Recovering Alcoholic 35 yrs ago. Quit 1979.   Drug use: No    Comment: Prescription drug addict x 41 years.  Sexual activity: Not Currently  Other Topics Concern   Not on file  Social History Narrative   Health Care POA: Elliot Cousin (h) [phone] (c) [phone]   Bjorn Loser has copy of Living Will and last will and testament. Has made plans for body to be donated to [facility] for study.   Emergency Contact: Elliot Cousin   End of Life Plan: DNR/DNI   Who lives with you: Lives with son who is handicapped   Any pets: bird, [place]a   Diet: Patient has a varied diet and does not eat fish or a lot of meat. Has lost sense of taste and smell so only eats to sustain.   Exercise:  gardens, and cleans home   Seatbelts: Patient reports wearing seat belt when in vehicle.   Sun Exposure/Protection: Patient denies wearing sun protection    Hobbies: Reading, gardening, visiting family   Lives in single story home, no stairs, grab bars in bathroom, no throw rugs on floor. No firearms. Has smoke and CO2 detectors.      Widowed 4 children- son Gerlene Burdock lives with her (mild MR).       Retired, formerly was Social research officer, government.  Recovered alcoholic, recovered prescription drug addict.  Quit 1979. Quit Smoking 2004.   Social Drivers of Corporate investment banker Strain: Low Risk  (09/08/2022)   Overall Financial Resource Strain (CARDIA)    Difficulty of Paying Living Expenses: Not hard at all  Food Insecurity: No Food Insecurity (09/08/2022)   Hunger Vital Sign    Worried About Running Out of Food in the Last Year: Never true    Ran Out of Food in the Last Year: Never true  Transportation Needs: No Transportation Needs (09/08/2022)   PRAPARE - Administrator, Civil Service (Medical): No    Lack of Transportation (Non-Medical): No  Physical Activity: Sufficiently Active (09/08/2022)   Exercise Vital Sign    Days of Exercise per Week: 5 days    Minutes of Exercise per Session: 30 min  Stress: No Stress Concern Present (09/08/2022)   [employer] of Occupational Health - Occupational Stress Questionnaire    Feeling of Stress : Not at all  Social Connections: Socially Isolated (09/08/2022)   Social Connection and Isolation Panel [NHANES]    Frequency of Communication with Friends and Family: More than three times a week    Frequency of Social Gatherings with Friends and Family: Three times a week    Attends Religious Services: Never    Active Member of Clubs or Organizations: No    Attends Banker Meetings: Never    Marital Status: Widowed  Intimate Partner Violence: Not At Risk (09/08/2022)   Humiliation, Afraid, Rape, and Kick questionnaire    Fear of Current or Ex-Partner: No    Emotionally Abused: No    Physically Abused: No    Sexually Abused: No    Family History:    Family History  Problem Relation Age of Onset   Heart disease Mother    Heart disease Father    Cancer Father        Kidney   Arthritis Sister    Heart disease Brother    Diabetes Son    Mental retardation Son    Alcohol abuse Sister    Cancer Brother    Cancer Daughter    Heart disease  Daughter      ROS:  Please see the history of present illness.   All other  ROS reviewed and negative.     Physical Exam/Data:   Vitals:   05/08/2023 1330 05/03/2023 1345 05/09/2023 1400 05/06/2023 1432  BP: 113/68 117/80 (!) 121/100 (!) 104/44  Pulse: 94 (!) 134 67   Resp: (!) 24 20 (!) 23 (!) 29  Temp:      TempSrc:      SpO2: 93% 97% (!) 86%    No intake or output data in the 24 hours ending 05/15/2023 1511    04/27/2023    9:43 AM 04/23/2023   12:07 PM 09/08/2022    8:33 AM  Last 3 Weights  Weight (lbs) 92 lb 8 oz 93 lb 92 lb  Weight (kg) 41.958 kg 42.185 kg 41.731 kg     There is no height or weight on file to calculate BMI.  General:  Frail and ill appearing. In acute respiratory distress.  HEENT: normal Neck: JVD elevated to mandible.  Vascular: No carotid bruits; Distal pulses 2+ bilaterally Cardiac:  rapid and irregular Lungs:  generally distant breath sounds  Abd: soft, nontender, no hepatomegaly  Ext: chronic appearing skin darkening with mixed edema (reported as near baseline by family) Musculoskeletal:  No deformities, BUE and BLE strength grossly intact Skin: warm and dry  Neuro:  CNs 2-12 intact, no focal abnormalities noted Psych:  Normal affect   EKG:  The EKG was personally reviewed and demonstrates:  significant baseline artifact. Afib with RVR/Aberrancy.  Telemetry:  Telemetry was personally reviewed and demonstrates:  atrial fibrillation with RVR/aberrancy.   Relevant CV Studies:  04/18/22 TTE  IMPRESSIONS     1. Left ventricular ejection fraction, by estimation, is 20 to 25%. The  left ventricle has severely decreased function. The left ventricle  demonstrates global hypokinesis. The left ventricular internal cavity size  was moderately dilated. Left  ventricular diastolic parameters are consistent with Grade II diastolic  dysfunction (pseudonormalization). Elevated left atrial pressure.   2. Right ventricular systolic function is mildly reduced. The  right  ventricular size is normal. There is moderately elevated pulmonary artery  systolic pressure. The estimated right ventricular systolic pressure is  52.0 mmHg.   3. Left atrial size was severely dilated.   4. Right atrial size was severely dilated.   5. A small pericardial effusion is present.   6. The mitral valve is abnormal. Severe mitral valve regurgitation.  Appears functional. No evidence of mitral stenosis.   7. Tricuspid valve regurgitation is moderate.   8. The aortic valve was not well visualized. Aortic valve regurgitation  is mild. No aortic stenosis is present.   9. The inferior vena cava is dilated in size with <50% respiratory  variability, suggesting right atrial pressure of 15 mmHg.   FINDINGS   Left Ventricle: Left ventricular ejection fraction, by estimation, is 20  to 25%. The left ventricle has severely decreased function. The left  ventricle demonstrates global hypokinesis. The left ventricular internal  cavity size was moderately dilated.  There is no left ventricular hypertrophy. Left ventricular diastolic  parameters are consistent with Grade II diastolic dysfunction  (pseudonormalization). Elevated left atrial pressure.   Right Ventricle: The right ventricular size is normal. Right vetricular  wall thickness was not well visualized. Right ventricular systolic  function is mildly reduced. There is moderately elevated pulmonary artery  systolic pressure. The tricuspid  regurgitant velocity is 3.04 m/s, and with an assumed right atrial  pressure of 15 mmHg, the estimated right ventricular systolic pressure is  52.0 mmHg.   Left Atrium: Left  atrial size was severely dilated.   Right Atrium: Right atrial size was severely dilated.   Pericardium: A small pericardial effusion is present.   Mitral Valve: The mitral valve is abnormal. Severe mitral valve  regurgitation. No evidence of mitral valve stenosis. MV peak gradient, 5.4  mmHg. The mean mitral  valve gradient is 2.0 mmHg.   Tricuspid Valve: The tricuspid valve is normal in structure. Tricuspid  valve regurgitation is moderate.   Aortic Valve: The aortic valve was not well visualized. Aortic valve  regurgitation is mild. Aortic regurgitation PHT measures 279 msec. No  aortic stenosis is present.   Pulmonic Valve: The pulmonic valve was grossly normal. Pulmonic valve  regurgitation is mild.   Aorta: The aortic root and ascending aorta are structurally normal, with  no evidence of dilitation.   Venous: The inferior vena cava is dilated in size with less than 50%  respiratory variability, suggesting right atrial pressure of 15 mmHg.   IAS/Shunts: The interatrial septum was not well visualized.    Laboratory Data:  High Sensitivity Troponin:   Recent Labs  Lab 05/19/2023 1223  TROPONINIHS 267*     Chemistry Recent Labs  Lab 05/28/2023 1223  NA 134*  K 4.2  CL 90*  CO2 12*  GLUCOSE 84  BUN 42*  CREATININE 2.62*  CALCIUM 9.7  GFRNONAA 17*  ANIONGAP 32*    Recent Labs  Lab 05/20/2023 1223  PROT 6.5  ALBUMIN 4.0  AST 1,477*  ALT 859*  ALKPHOS 62  BILITOT 3.6*   Lipids No results for input(s): "CHOL", "TRIG", "HDL", "LABVLDL", "LDLCALC", "CHOLHDL" in the last 168 hours.  Hematology Recent Labs  Lab 05/16/2023 1223  WBC 16.7*  RBC 4.64  HGB 16.2*  HCT 49.3*  MCV 106.3*  MCH 34.9*  MCHC 32.9  RDW 16.5*  PLT 135*   Thyroid No results for input(s): "TSH", "FREET4" in the last 168 hours.  BNP Recent Labs  Lab 05/11/2023 1223  BNP >4,500.0*    DDimer No results for input(s): "DDIMER" in the last 168 hours.   Radiology/Studies:  DG Chest Portable 1 View Result Date: 05/14/2023 CLINICAL DATA:  Shortness of breath. EXAM: PORTABLE CHEST 1 VIEW COMPARISON:  Chest radiograph dated 04/18/2022. FINDINGS: Background of emphysema. No focal consolidation, pleural effusion, pneumothorax. Mild cardiomegaly. Left pectoral AICD device. Atherosclerotic calcification  of the aorta. Osteopenia with degenerative changes of the spine. No acute osseous pathology. IMPRESSION: 1. No active disease. 2. Emphysema. Electronically Signed   By: Elgie Collard M.D.   On: 05/19/2023 14:43     Assessment and Plan:   Acute hypoxic respiratory failure Hypothermia/shock HFrEF  Patient with history of NICM, EF 04/18/22 showing LVEF 20-25% presented to the ED with EMS for evaluation of progressive dyspnea. She was found to be in respiratory distress with afib/RVR.  In the ED, patient acutely ill with significantly increased rate and work of breathing. No pulmonary crackles/rales noted. Chronic LE edema noted, per patient not acutely worse from recent baseline.  Per device interrogation/Optivol, patient actually appears to be dehydrated after previously having evidence of hypervolemia. Question whether she may have been over-diuresed recently with treatment of lower extremity edema.  Etiology of her severe respiratory distress is unclear at this time with negative respiratory virus panel. Labs included pan-concentrated CBC, CMP with significant transaminitis, and acute kidney injury support hypoperfusion/shock.  Lactic acid pending. Expect significant elevation given LFT elevation and AKI. Patient does not appear hypervolemic. Although her BNP is elevated >4500, would  attribute to significantly elevated HR, notable cardiomegaly, and AKI. No diuresis recommended.  No beta blocker given concern of low output Consider repeat TTE with improved rate control Check STAT ABG  Atrial fibrillation with RVR  Patient with prior history of paroxysmal atrial fibrillation noted to have wide complex, irregularly irregular rhythm with EMS and in the ED. This appears to be afib with aberrancy rather than VT. Device interrogation with afib for approximately 2 days. Given concern for low output with HFrEF and AKI, options for rate control severely limited. Start Amiodarone, 150mg  bolus followed  by 60mg /hr, then tapered to 30mg /hr. Family and patient aware that utilizing Amiodarone without OAC could result in embolism and stroke if she has active LA thrombus. Suspect much of her current QRS widening is secondary to acidosis. Lactic acid and ABG pending.  Start heparin infusion  S/P CRTP  Patient  s/p St Jude ICD 2007 >> downgraded to CRT-P 12/ 2014 >> LV lead revision 02/2013 >> gen change 08/16/19. ICD lead on CXR not active.    Discussed code status in detail with patient. Both she and daughter confirm prior discussion of DNR/DNI and state that patient has a DNR form completed at home.   Risk Assessment/Risk Scores:        New York Heart Association (NYHA) Functional Class NYHA Class IV  CHA2DS2-VASc Score = 6   This indicates a 9.7% annual risk of stroke. The patient's score is based upon: CHF History: 1 HTN History: 1 Diabetes History: 0 Stroke History: 0 Vascular Disease History: 1 Age Score: 2 Gender Score: 1         For questions or updates, please contact  [facility] Please consult [email or web address] for contact info under    Signed, Perlie Gold, PA-C  05/23/2023 3:11 PM

## 2023-05-30 NOTE — Hospital Course (Addendum)
[name] is a 89 y.o.female with a history of chronic systolic heart failure (EF 20-25%), ICD, HTN, nonischemic cardiomyopathy, HLD, osteoporosis, A-fib, peripheral neuropathy, Raynaud's disease and urgency incontinence who was admitted to the [facility] Medicine Teaching Service at [facility] for A-fib with RVR. Her hospital course is detailed below:  A-fib with RVR Acute on chronic systolic heart failure Shock liver AKI Elevated troponin Presented to the ED with palpitations and chest pain that began earlier that day.  Found to be in A-fib with RVR with significant dyspnea.  Cardiology was consulted and started amiodarone drip, with modest improvement in heart rate.  Thoroughly discussed goals of care, patient and family reiterated DNR/DNI status and no desire for extensive interventions.  Around 20 minutes after admission, contacted by nursing about dropping heart rate and blood pressure.  Patient assessed at bedside and notably without respiration or cardiac function, time of death declared at 16:14.  Daughter, son and daughter-in-law present at bedside, allowed space for grieving.

## 2023-05-30 NOTE — Progress Notes (Signed)
Discussed Bipap with Dr. Jennette Kettle. Bipap not needed at this time.

## 2023-05-30 NOTE — Discharge Summary (Signed)
   Family Medicine Teaching [facility] Death Summary  Patient name: [name] Medical record number: [id number] Date of birth: Jun 27, 1929 Age: 89 y.o. Gender: female Date of Admission: 05-22-23  Date of Death: 05/22/23 Admitting Physician: Nestor Ramp, MD  Primary Care Provider: Latrelle Dodrill, MD Consultants: Cardiology  Indication for Hospitalization: Afib with RVR  Diagnoses/Problem List:  Principal Problem for Admission: Other Problems addressed during stay:  Principal Problem:   Atrial fibrillation with RVR [facility])  Brief Hospital Course:  [name] is a 89 y.o.female with a history of chronic systolic heart failure (EF 20-25%), ICD, HTN, nonischemic cardiomyopathy, HLD, osteoporosis, A-fib, peripheral neuropathy, Raynaud's disease and urgency incontinence who was admitted to the [facility] Medicine Teaching Service at [facility] for A-fib with RVR. Her hospital course is detailed below:  A-fib with RVR Acute on chronic systolic heart failure Shock liver AKI Elevated troponin Presented to the ED with palpitations and chest pain that began earlier that day.  Found to be in A-fib with RVR with significant dyspnea.  Cardiology was consulted and started amiodarone drip, with modest improvement in heart rate.  Thoroughly discussed goals of care, patient and family reiterated DNR/DNI status and no desire for extensive interventions.  Around 20 minutes after admission, contacted by nursing about dropping heart rate and blood pressure.  Patient assessed at bedside and notably without respiration or cardiac function, time of death declared at 16:14.  Daughter, son and daughter-in-law present at bedside, allowed space for grieving.   Significant Procedures: None  Significant Labs and Imaging:  Recent Labs  Lab 05-22-2023 1223  WBC 16.7*  HGB 16.2*  HCT 49.3*  PLT 135*   Recent Labs  Lab 05-22-23 1223  NA 134*  K 4.2  CL 90*  CO2 12*  GLUCOSE 84  BUN 42*  CREATININE  2.62*  CALCIUM 9.7  ALKPHOS 62  AST 1,477*  ALT 859*  ALBUMIN 4.0    Pertinent Imaging: DG Chest Portable 1 View Result Date: 05/22/23 IMPRESSION: 1. No active disease. 2. Emphysema.    Elberta Fortis, MD 05/22/2023, 6:52 PM PGY-2, [facility]

## 2023-05-30 NOTE — ED Triage Notes (Signed)
PT BIB EMS for SOB. Pt states SOB woke her at 230am. Denies any SOB. Pt normally walks with walker. Pt Afib RVR with EMS on non breather. BLE swelling.   EMS Vitals EMS Unable to obtain oxygen saturation  142/70 RR 40 HR 120  20LAC

## 2023-05-30 NOTE — Telephone Encounter (Signed)
Point of Service transmission:  [employer] on Dem[employer] Several AMS for <1% since 2022. Presents in AF on the 2/11 remote.  Recent increase via trending.  Appears to have started 1-2 days ago via trending.  On OAC per EMR.  Sending for review.  Normal device function.     NOTE: patient to ER today, now appears in chart as having passed (deceased).  Forwarding to CMA team to begin discontinuation from monitoring and offering of condolences to family.

## 2023-05-30 NOTE — Progress Notes (Signed)
PHARMACY - ANTICOAGULATION CONSULT NOTE  Pharmacy Consult for heparin drip Indication: atrial fibrillation  Allergies  Allergen Reactions   Sulfamethoxazole Anaphylaxis, Shortness Of Breath and Swelling    Patient Measurements:   Heparin Dosing Weight: 42 kg  Vital Signs: Temp: 95.5 F (35.3 C) (02/11 1233) Temp Source: Rectal (02/11 1233) BP: 103/66 (02/11 1530) Pulse Rate: 58 (02/11 1530)  Labs: Recent Labs    05/02/2023 1223  HGB 16.2*  HCT 49.3*  PLT 135*  CREATININE 2.62*  TROPONINIHS 267*    CrCl cannot be calculated (Unknown ideal weight.).   Medical History: Past Medical History:  Diagnosis Date   Biventricular ICD (implantable cardioverter-defibrillator)-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 Asymptomatic.  Sees Dr Clarene Duke.   Peripheral neuropathy, hereditary/idiopathic 2012   Raynaud phenomenon    Sinus tachycardia    Systolic CHF (HCC)    Urge incontinence     Medications:  Scheduled:  Infusions:   amiodarone 60 mg/hr (05/06/2023 1536)   Followed by   amiodarone      Assessment: 89 yo F presented with SOB, found to be in Afib with RVR. PMH of NICM s/p BiV ICD in 2007/2012, s/p CRTP upgrade 2014, paroxysmal Afib. Not on anticoagulation PTA. Pharmacy consulted for heparin management.  Hgb 16.2, Plts 135. Will give a small bolus given age and low body weight. CHA2DS2-VASc Score = 6    Goal of Therapy:  Heparin level 0.3-0.7 units/ml Monitor platelets by anticoagulation protocol: Yes   Plan:  Give heparin bolus 1500 units x1 Start heparin infusion at 500 units/hr Check 8 hour heparin level Monitor daily heparin level and CBC Monitor for  signs/symptoms of bleeding   Ernestene Kiel, PharmD PGY1 Pharmacy Resident  Please check AMION for all [facility] Pharmacy phone numbers After 10:00 PM, call Main Pharmacy [phone] 05/23/2023,3:35 PM

## 2023-05-30 NOTE — ED Provider Notes (Signed)
[place] EMERGENCY DEPARTMENT AT [facility] Provider Note   CSN: [id number] Arrival date & time: 05/18/2023  1200     History  Chief Complaint  Patient presents with   Shortness of Breath    [name] is a 89 y.o. female.   Shortness of Breath Patient with shortness of breath over the last couple days.  Has had swelling in her legs recently.  Had increasing diuretic dose by PCP.  Now more short of breath.  Tachycardic.  Has a pacemaker.  History of paroxysmal A-fib.  EMS was unable to obtain pulse ox.  No fevers.  Has had occasional cough.    Past Medical History:  Diagnosis Date   Biventricular ICD (implantable cardioverter-defibrillator)-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 Asymptomatic.  Sees Dr Clarene Duke.   Peripheral neuropathy, hereditary/idiopathic 2012   Raynaud phenomenon    Sinus tachycardia    Systolic CHF (HCC)    Urge incontinence     Home Medications Prior to Admission medications   Medication Sig Start Date End Date Taking? Authorizing Provider  acetaminophen (TYLENOL) 650 MG CR tablet Take 2 tablets (1,300 mg total) by mouth 2 (two) times daily as needed for pain. 04/21/22  Yes Rolly Salter, MD  B Complex-C (B-COMPLEX WITH VITAMIN C) tablet Take 1 tablet by mouth daily. 04/22/22  Yes Rolly Salter, MD  Carboxymethylcellulose Sodium (EYE DROPS OP) Place 1-2 drops into both eyes as needed (itching).   Yes [provider]  diclofenac Sodium (VOLTAREN) 1 % GEL Apply 2 g topically 2 (two) times daily as needed (joint pains). 04/21/22  Yes Rolly Salter, MD  metoprolol succinate (TOPROL-XL) 25 MG 24 hr tablet Take 1 tablet (25 mg  total) by mouth daily. 05/21/22  Yes Duke Salvia, MD  mirtazapine (REMERON) 15 MG tablet Take 1 tablet (15 mg total) by mouth at bedtime. 09/30/22  Yes Latrelle Dodrill, MD  torsemide (DEMADEX) 20 MG tablet TAKE 2 TABLETS(40 MG) BY MOUTH DAILY 08/12/22  Yes Duke Salvia, MD      Allergies    Sulfamethoxazole    Review of Systems   Review of Systems  Respiratory:  Positive for shortness of breath.     Physical Exam Updated Vital Signs BP 103/66   Pulse (!) 58   Temp (!) 95.5 F (35.3 C) (Rectal)   Resp (!) 30   SpO2 94%  Physical Exam Vitals and nursing note reviewed.  Neck:     Vascular: JVD present.  Cardiovascular:     Rate and Rhythm: Tachycardia present. Rhythm irregular.  Pulmonary:     Comments: Mildly harsh breath sounds. Chest:     Chest wall: No tenderness.  Musculoskeletal:     Right lower leg: Edema present.     Left lower leg: Edema present.  Neurological:     Mental Status: She is alert.     ED Results / Procedures / Treatments   Labs (all labs ordered are listed, but only abnormal results are displayed) Labs Reviewed  CBC WITH DIFFERENTIAL/PLATELET - Abnormal; Notable for the following components:      Result Value  WBC 16.7 (*)    Hemoglobin 16.2 (*)    HCT 49.3 (*)    MCV 106.3 (*)    MCH 34.9 (*)    RDW 16.5 (*)    Platelets 135 (*)    nRBC 0.3 (*)    Neutro Abs 14.1 (*)    Monocytes Absolute 1.3 (*)    Abs Immature Granulocytes 0.49 (*)    All other components within normal limits  COMPREHENSIVE METABOLIC PANEL - Abnormal; Notable for the following components:   Sodium 134 (*)    Chloride 90 (*)    CO2 12 (*)    BUN 42 (*)    Creatinine, Ser 2.62 (*)    AST 1,477 (*)    ALT 859 (*)    Total Bilirubin 3.6 (*)    GFR, Estimated 17 (*)    Anion gap 32 (*)    All other components within normal limits  BRAIN NATRIURETIC PEPTIDE - Abnormal; Notable for the following components:   B Natriuretic Peptide >4,500.0 (*)    All other  components within normal limits  TROPONIN I (HIGH SENSITIVITY) - Abnormal; Notable for the following components:   Troponin I (High Sensitivity) 267 (*)    All other components within normal limits  RESP PANEL BY RT-PCR (RSV, FLU A&B, COVID)  RVPGX2  CULTURE, BLOOD (ROUTINE X 2)  CULTURE, BLOOD (ROUTINE X 2)  PROCALCITONIN  HEPARIN LEVEL (UNFRACTIONATED)  BLOOD GAS, ARTERIAL  I-STAT CG4 LACTIC ACID, ED  I-STAT CG4 LACTIC ACID, ED  TROPONIN I (HIGH SENSITIVITY)    EKG EKG Interpretation Date/Time:  Tuesday May 12 2023 12:33:51 EST Ventricular Rate:  135 PR Interval:    QRS Duration:  175 QT Interval:  372 QTC Calculation: 564 R Axis:   238  Text Interpretation: Atrial fibrillation ? Nonspecific intraventricular conduction delay Probable lateral infarct, age indeterminate Anterior infarct, old Artifact in lead(s) I II aVR aVL and baseline wander in lead(s) I II aVR aVL V4 Confirmed by Benjiman Core [phone]) on 05/21/2023 12:43:49 PM  Radiology DG Chest Portable 1 View Result Date: 05/06/2023 CLINICAL DATA:  Shortness of breath. EXAM: PORTABLE CHEST 1 VIEW COMPARISON:  Chest radiograph dated 04/18/2022. FINDINGS: Background of emphysema. No focal consolidation, pleural effusion, pneumothorax. Mild cardiomegaly. Left pectoral AICD device. Atherosclerotic calcification of the aorta. Osteopenia with degenerative changes of the spine. No acute osseous pathology. IMPRESSION: 1. No active disease. 2. Emphysema. Electronically Signed   By: Elgie Collard M.D.   On: 05/08/2023 14:43    Procedures Procedures    Medications Ordered in ED Medications  amiodarone (NEXTERONE) 1.8 mg/mL load via infusion 150 mg (150 mg Intravenous Bolus from Bag 05/17/2023 1520)    Followed by  amiodarone (NEXTERONE PREMIX) 360-4.14 MG/200ML-% (1.8 mg/mL) IV infusion (60 mg/hr Intravenous New Bag/Given 05/21/2023 1536)    Followed by  amiodarone (NEXTERONE PREMIX) 360-4.14 MG/200ML-% (1.8 mg/mL) IV  infusion (has no administration in time range)  heparin bolus via infusion 1,500 Units (has no administration in time range)  heparin ADULT infusion 100 units/mL (25000 units/213mL) (has no administration in time range)  sodium chloride 0.9 % bolus 500 mL (500 mLs Intravenous New Bag/Given 05/05/2023 1434)    ED Course/ Medical Decision Making/ A&P                                 Medical Decision Making Amount and/or Complexity of Data Reviewed Labs: ordered. Radiology: ordered.  Risk Decision regarding hospitalization.   Patient shortness of breath and cough.  Tachycardia with likely A-fib.  Unsure of onset however.  Not anticoagulated.  Differential diagnose includes CHF.  Also potentially volume decreased with decreased oral intake and having been on diuretic.  Does have JVD however.  Interrogation of pacemaker shows 2 days of A-fib but also reportedly volume down when she had been volume overloaded and January.  Has a new creatinine above 2.  Hypothermic.  Cardiology is seeing patient.  Will discuss with family medicine, who is her primary.  CRITICAL CARE Performed by: Benjiman Core Total critical care time: 30 minutes Critical care time was exclusive of separately billable procedures and treating other patients. Critical care was necessary to treat or prevent imminent or life-threatening deterioration. Critical care was time spent personally by me on the following activities: development of treatment plan with patient and/or surrogate as well as nursing, discussions with consultants, evaluation of patient's response to treatment, examination of patient, obtaining history from patient or surrogate, ordering and performing treatments and interventions, ordering and review of laboratory studies, ordering and review of radiographic studies, pulse oximetry and re-evaluation of patient's condition.         Final Clinical Impression(s) / ED Diagnoses Final diagnoses:  Atrial  fibrillation with rapid ventricular response (HCC)  AKI (acute kidney injury) [facility])    Rx / DC Orders ED Discharge Orders     None         Benjiman Core, MD 05/05/2023 1610

## 2023-05-30 NOTE — ED Notes (Signed)
Pt transferred to morgue at this time. Patient placement made aware.

## 2023-05-30 DEATH — deceased
